# Patient Record
Sex: Female | Born: 1955 | Race: White | Hispanic: Refuse to answer | Marital: Single | Smoking: Never smoker
Health system: Southern US, Community
[De-identification: ages and names within clinical notes are randomized; demographics above are authoritative.]

## PROBLEM LIST (undated history)

## (undated) DIAGNOSIS — J45909 Unspecified asthma, uncomplicated: Secondary | ICD-10-CM

## (undated) DIAGNOSIS — E785 Hyperlipidemia, unspecified: Secondary | ICD-10-CM

## (undated) DIAGNOSIS — I2584 Coronary atherosclerosis due to calcified coronary lesion: Secondary | ICD-10-CM

## (undated) DIAGNOSIS — K7460 Unspecified cirrhosis of liver: Secondary | ICD-10-CM

## (undated) DIAGNOSIS — R011 Cardiac murmur, unspecified: Secondary | ICD-10-CM

## (undated) DIAGNOSIS — C189 Malignant neoplasm of colon, unspecified: Secondary | ICD-10-CM

## (undated) DIAGNOSIS — R600 Localized edema: Secondary | ICD-10-CM

## (undated) DIAGNOSIS — T7840XA Allergy, unspecified, initial encounter: Secondary | ICD-10-CM

## (undated) DIAGNOSIS — R197 Diarrhea, unspecified: Secondary | ICD-10-CM

## (undated) DIAGNOSIS — G709 Myoneural disorder, unspecified: Secondary | ICD-10-CM

## (undated) DIAGNOSIS — Z8679 Personal history of other diseases of the circulatory system: Secondary | ICD-10-CM

## (undated) DIAGNOSIS — M069 Rheumatoid arthritis, unspecified: Secondary | ICD-10-CM

## (undated) DIAGNOSIS — N189 Chronic kidney disease, unspecified: Secondary | ICD-10-CM

## (undated) DIAGNOSIS — K219 Gastro-esophageal reflux disease without esophagitis: Secondary | ICD-10-CM

## (undated) DIAGNOSIS — D649 Anemia, unspecified: Secondary | ICD-10-CM

## (undated) DIAGNOSIS — E669 Obesity, unspecified: Secondary | ICD-10-CM

## (undated) DIAGNOSIS — I2510 Atherosclerotic heart disease of native coronary artery without angina pectoris: Secondary | ICD-10-CM

## (undated) DIAGNOSIS — R51 Headache: Secondary | ICD-10-CM

## (undated) DIAGNOSIS — R519 Headache, unspecified: Secondary | ICD-10-CM

## (undated) DIAGNOSIS — I10 Essential (primary) hypertension: Secondary | ICD-10-CM

## (undated) DIAGNOSIS — H269 Unspecified cataract: Secondary | ICD-10-CM

## (undated) DIAGNOSIS — R0600 Dyspnea, unspecified: Secondary | ICD-10-CM

## (undated) DIAGNOSIS — J984 Other disorders of lung: Secondary | ICD-10-CM

## (undated) DIAGNOSIS — E66812 Obesity, class 2: Secondary | ICD-10-CM

## (undated) DIAGNOSIS — J189 Pneumonia, unspecified organism: Secondary | ICD-10-CM

## (undated) HISTORY — DX: Myoneural disorder, unspecified: G70.9

## (undated) HISTORY — DX: Localized edema: R60.0

## (undated) HISTORY — DX: Anemia, unspecified: D64.9

## (undated) HISTORY — DX: Cardiac murmur, unspecified: R01.1

## (undated) HISTORY — DX: Allergy, unspecified, initial encounter: T78.40XA

## (undated) HISTORY — DX: Hyperlipidemia, unspecified: E78.5

## (undated) HISTORY — PX: WRIST SURGERY: SHX841

## (undated) HISTORY — DX: Unspecified cataract: H26.9

## (undated) HISTORY — DX: Atherosclerotic heart disease of native coronary artery without angina pectoris: I25.10

## (undated) HISTORY — DX: Malignant neoplasm of colon, unspecified: C18.9

## (undated) HISTORY — PX: ABDOMINAL HYSTERECTOMY: SHX81

## (undated) HISTORY — DX: Obesity, class 2: E66.812

## (undated) HISTORY — PX: DILATION AND CURETTAGE OF UTERUS: SHX78

## (undated) HISTORY — DX: Obesity, unspecified: E66.9

---

## 2011-04-12 DIAGNOSIS — J849 Interstitial pulmonary disease, unspecified: Secondary | ICD-10-CM | POA: Insufficient documentation

## 2011-04-12 DIAGNOSIS — R062 Wheezing: Secondary | ICD-10-CM | POA: Insufficient documentation

## 2011-07-06 DIAGNOSIS — K7460 Unspecified cirrhosis of liver: Secondary | ICD-10-CM

## 2011-07-06 HISTORY — DX: Unspecified cirrhosis of liver: K74.60

## 2012-10-03 DIAGNOSIS — G4730 Sleep apnea, unspecified: Secondary | ICD-10-CM | POA: Insufficient documentation

## 2012-11-14 DIAGNOSIS — G2581 Restless legs syndrome: Secondary | ICD-10-CM | POA: Insufficient documentation

## 2013-01-08 ENCOUNTER — Emergency Department (HOSPITAL_COMMUNITY)
Admission: EM | Admit: 2013-01-08 | Discharge: 2013-01-08 | Disposition: A | Discharge status: Home / Self Care | Payer: BC Managed Care – PPO | Attending: Emergency Medicine | Admitting: Emergency Medicine

## 2013-01-08 ENCOUNTER — Encounter (HOSPITAL_COMMUNITY): Payer: Self-pay | Admitting: *Deleted

## 2013-01-08 DIAGNOSIS — X58XXXA Exposure to other specified factors, initial encounter: Secondary | ICD-10-CM | POA: Insufficient documentation

## 2013-01-08 DIAGNOSIS — R0609 Other forms of dyspnea: Secondary | ICD-10-CM | POA: Insufficient documentation

## 2013-01-08 DIAGNOSIS — J309 Allergic rhinitis, unspecified: Secondary | ICD-10-CM | POA: Insufficient documentation

## 2013-01-08 DIAGNOSIS — R509 Fever, unspecified: Secondary | ICD-10-CM | POA: Insufficient documentation

## 2013-01-08 DIAGNOSIS — R51 Headache: Secondary | ICD-10-CM | POA: Insufficient documentation

## 2013-01-08 DIAGNOSIS — S161XXA Strain of muscle, fascia and tendon at neck level, initial encounter: Secondary | ICD-10-CM

## 2013-01-08 DIAGNOSIS — R519 Headache, unspecified: Secondary | ICD-10-CM

## 2013-01-08 DIAGNOSIS — R0982 Postnasal drip: Secondary | ICD-10-CM | POA: Insufficient documentation

## 2013-01-08 DIAGNOSIS — S139XXA Sprain of joints and ligaments of unspecified parts of neck, initial encounter: Secondary | ICD-10-CM | POA: Insufficient documentation

## 2013-01-08 DIAGNOSIS — R05 Cough: Secondary | ICD-10-CM | POA: Insufficient documentation

## 2013-01-08 DIAGNOSIS — Y929 Unspecified place or not applicable: Secondary | ICD-10-CM | POA: Insufficient documentation

## 2013-01-08 DIAGNOSIS — R059 Cough, unspecified: Secondary | ICD-10-CM | POA: Insufficient documentation

## 2013-01-08 DIAGNOSIS — H9209 Otalgia, unspecified ear: Secondary | ICD-10-CM | POA: Insufficient documentation

## 2013-01-08 DIAGNOSIS — Z880 Allergy status to penicillin: Secondary | ICD-10-CM | POA: Insufficient documentation

## 2013-01-08 DIAGNOSIS — Z79899 Other long term (current) drug therapy: Secondary | ICD-10-CM | POA: Insufficient documentation

## 2013-01-08 DIAGNOSIS — Y999 Unspecified external cause status: Secondary | ICD-10-CM | POA: Insufficient documentation

## 2013-01-08 DIAGNOSIS — R0989 Other specified symptoms and signs involving the circulatory and respiratory systems: Secondary | ICD-10-CM | POA: Insufficient documentation

## 2013-01-08 DIAGNOSIS — Z8701 Personal history of pneumonia (recurrent): Secondary | ICD-10-CM | POA: Insufficient documentation

## 2013-01-08 DIAGNOSIS — Z881 Allergy status to other antibiotic agents status: Secondary | ICD-10-CM | POA: Insufficient documentation

## 2013-01-08 DIAGNOSIS — Z888 Allergy status to other drugs, medicaments and biological substances status: Secondary | ICD-10-CM | POA: Insufficient documentation

## 2013-01-08 DIAGNOSIS — J3489 Other specified disorders of nose and nasal sinuses: Secondary | ICD-10-CM | POA: Insufficient documentation

## 2013-01-08 HISTORY — DX: Pneumonia, unspecified organism: J18.9

## 2013-01-08 LAB — CBC WITH DIFFERENTIAL/PLATELET
Basophils Relative: 0 % (ref 0–1)
HCT: 40.5 % (ref 36.0–46.0)
Hemoglobin: 13.9 g/dL (ref 12.0–15.0)
Lymphocytes Relative: 25 % (ref 12–46)
Lymphs Abs: 1.7 10*3/uL (ref 0.7–4.0)
MCHC: 34.3 g/dL (ref 30.0–36.0)
Monocytes Absolute: 0.3 10*3/uL (ref 0.1–1.0)
Monocytes Relative: 5 % (ref 3–12)
Neutro Abs: 4.6 10*3/uL (ref 1.7–7.7)

## 2013-01-08 LAB — BASIC METABOLIC PANEL
BUN: 9 mg/dL (ref 6–23)
Chloride: 108 mEq/L (ref 96–112)
GFR calc Af Amer: >90 mL/min (ref >90)
Potassium: 3 mEq/L — ABNORMAL LOW (ref 3.5–5.1)

## 2013-01-08 MED ORDER — ACETAMINOPHEN 500 MG PO TABS: 500.0000 mg | ORAL_TABLET | Freq: Four times a day (QID) | ORAL | Status: DC | PRN

## 2013-01-08 MED ORDER — POTASSIUM CHLORIDE CRYS ER 20 MEQ PO TBCR
40.0000 meq | EXTENDED_RELEASE_TABLET | Freq: Once | ORAL | Status: AC
  Administered 2013-01-08: 40 meq via ORAL
  Filled 2013-01-08: qty 2

## 2013-01-08 NOTE — ED Notes (Signed)
Pt in NAD at discharge. Pt is ambulatory.

## 2013-01-08 NOTE — ED Provider Notes (Signed)
History    CSN: [id number] Arrival date & time 01/08/13  1314  First MD Initiated Contact with Patient 01/08/13 [phone]     Chief Complaint  Patient presents with  . Neck Pain  . Fever  . Headache   (Consider location/radiation/quality/duration/timing/severity/associated sxs/prior Treatment) HPI Comments: Pt w/ hx of possible SLE, frequent URIs now w/ left sided neck pain. Started 3 days ago, no trauma or heavy lifting. No radiculopathy. Pain radiates along left temporal area. Sx a/w otalgia, nasal congestion, PND, sinus pressure. + hx of pneumonia  Several years ago w/ intermittent cough - but cough is improving. No change from baseline. No recent travel or sick contact. No AMS, mild HA on left side. Admits to chills and subjective fever last pm but didn't check temp - took tylenol last pm. Denies confusion or neck rigidity. No unilateral weakness or numbness.   Patient is a 57 y.o. female presenting with neck pain, fever, and headaches. The history is provided by the patient. No language interpreter was used.  Neck Pain Pain location:  L side Quality:  Aching Radiates to: left temporal. Pain severity:  Mild Pain is:  Same all the time Onset quality:  Gradual Duration:  3 days Timing:  Constant Progression:  Worsening Relieved by:  Nothing Worsened by:  Nothing tried Ineffective treatments:  None tried Associated symptoms: fever and headaches   Associated symptoms: no chest pain   Fever Associated symptoms: congestion, ear pain, headaches and rhinorrhea   Associated symptoms: no chest pain, no chills, no confusion, no cough, no diarrhea, no dysuria, no nausea, no rash, no sore throat and no vomiting   Headache Associated symptoms: congestion, drainage, ear pain, fever and neck pain   Associated symptoms: no abdominal pain, no cough, no diarrhea, no dizziness, no nausea, no sore throat and no vomiting    Past Medical History  Diagnosis Date  . Pneumonia    Past Surgical  History  Procedure Laterality Date  . Abdominal hysterectomy     No family history on file. History  Substance Use Topics  . Smoking status: Never Smoker   . Smokeless tobacco: Not on file  . Alcohol Use: No   OB History   Grav Para Term Preterm Abortions TAB SAB Ect Mult Living                 Review of Systems  Constitutional: Positive for fever. Negative for chills.  HENT: Positive for ear pain, congestion, rhinorrhea, neck pain and postnasal drip. Negative for sore throat.   Respiratory: Negative for cough and shortness of breath.   Cardiovascular: Negative for chest pain and leg swelling.  Gastrointestinal: Negative for nausea, vomiting, abdominal pain, diarrhea and constipation.  Genitourinary: Negative for dysuria and frequency.  Skin: Negative for color change and rash.  Neurological: Positive for headaches. Negative for dizziness.  Psychiatric/Behavioral: Negative for confusion and agitation.  All other systems reviewed and are negative.    Allergies  Bee venom; Aspirin; Erythromycin; Other; and Penicillins  Home Medications   Current Outpatient Rx  Name  Route  Sig  Dispense  Refill  . acetaminophen (TYLENOL) 500 MG tablet   Oral   Take 500 mg by mouth daily as needed for pain or fever.         Marland Kitchen albuterol (PROVENTIL HFA;VENTOLIN HFA) 108 (90 BASE) MCG/ACT inhaler   Inhalation   Inhale 2 puffs into the lungs 3 (three) times daily as needed for wheezing or shortness of breath.         Marland Kitchen  albuterol (PROVENTIL) (2.5 MG/3ML) 0.083% nebulizer solution   Nebulization   Take 2.5 mg by nebulization 3 (three) times daily as needed for wheezing or shortness of breath.         . Azelastine-Fluticasone (DYMISTA) 137-50 MCG/ACT SUSP   Nasal   Place 2 sprays into the nose 3 (three) times daily as needed (for congestion).          BP 154/81  Pulse 67  Temp(Src) 98.4 F (36.9 C) (Oral)  Resp 16  SpO2 100% Physical Exam  Vitals reviewed. Constitutional:  She is oriented to person, place, and time. She appears well-developed and well-nourished. No distress.  HENT:  Head: Normocephalic and atraumatic.    Right Ear: Tympanic membrane normal.  Left Ear: Tympanic membrane normal.  Mouth/Throat: Uvula is midline, oropharynx is clear and moist and mucous membranes are normal.  Eyes: EOM are normal. Pupils are equal, round, and reactive to light.  Neck: Normal range of motion. Neck supple. Muscular tenderness present. No spinous process tenderness present. No rigidity. No Brudzinski's sign noted.    Cardiovascular: Normal rate and regular rhythm.   Pulmonary/Chest: Effort normal and breath sounds normal. No respiratory distress.  Abdominal: Soft. She exhibits no distension.  Musculoskeletal: Normal range of motion. She exhibits no edema.  Neurological: She is alert and oriented to person, place, and time. She has normal strength. No cranial nerve deficit or sensory deficit. Coordination normal. GCS eye subscore is 4. GCS verbal subscore is 5. GCS motor subscore is 6.  Skin: Skin is warm and dry.  Psychiatric: She has a normal mood and affect. Her behavior is normal.    ED Course  Procedures (including critical care time) Results for orders placed during the hospital encounter of 01/08/13  BASIC METABOLIC PANEL      Result Value Range   Sodium 147 (*) 135 - 145 mEq/L   Potassium 3.0 (*) 3.5 - 5.1 mEq/L   Chloride 108  96 - 112 mEq/L   CO2 27  19 - 32 mEq/L   Glucose, Bld 136 (*) 70 - 99 mg/dL   BUN 9  6 - 23 mg/dL   Creatinine, Ser 7.42  0.50 - 1.10 mg/dL   Calcium 9.4  8.4 - 59.5 mg/dL   GFR calc non Af Amer >90  >90 mL/min   GFR calc Af Amer >90  >90 mL/min  CBC WITH DIFFERENTIAL      Result Value Range   WBC 6.6  4.0 - 10.5 K/uL   RBC 4.93  3.87 - 5.11 MIL/uL   Hemoglobin 13.9  12.0 - 15.0 g/dL   HCT 63.8  75.6 - 43.3 %   MCV 82.2  78.0 - 100.0 fL   MCH 28.2  26.0 - 34.0 pg   MCHC 34.3  30.0 - 36.0 g/dL   RDW 29.5  18.8 - 41.6  %   Platelets 169  150 - 400 K/uL   Neutrophils Relative % 69  43 - 77 %   Neutro Abs 4.6  1.7 - 7.7 K/uL   Lymphocytes Relative 25  12 - 46 %   Lymphs Abs 1.7  0.7 - 4.0 K/uL   Monocytes Relative 5  3 - 12 %   Monocytes Absolute 0.3  0.1 - 1.0 K/uL   Eosinophils Relative 1  0 - 5 %   Eosinophils Absolute 0.1  0.0 - 0.7 K/uL   Basophils Relative 0  0 - 1 %   Basophils Absolute 0.0  0.0 -  0.1 K/uL    No results found. No diagnosis found.  MDM  Exam as above, no clinical meningismus. No focal neuro deficit.  likely viral URI or occipital neuralgia, doubt meningitis/encephalitis, HNP, radiculitis, fx/dislocation. Will refrain from labs or imaging at this time. No indication for LP. Potassium low at 3.0 - given kcl.  Will recommend symptomatic tx w/ NSAIDS and decongestants. Use heat/ice for comfort. Doubt bacterial sinusitis based on duration and pts well appearance on exam - non toxic.  fup w/ pcp 4 days if sx not improving. D/c home in good and stable condition. Given return precautions.   1. Cervical strain, acute, initial encounter   2. Headache    Discharge Medication List as of 01/08/2013  6:50 PM    START taking these medications   Details  !! acetaminophen (TYLENOL) 500 MG tablet Take 1 tablet (500 mg total) by mouth every 6 (six) hours as needed for pain., Starting 01/08/2013, Until Discontinued, Print     !! - Potential duplicate medications found. Please discuss with provider.     [facility]     [address] [place] [address]  Schedule an appointment as soon as possible for a visit for repeat blood pressure check in 3 wks     Audelia Hives, MD 01/09/13 [phone]

## 2013-01-08 NOTE — ED Notes (Signed)
Pt is here with fever, lower head and neck pain since Saturday.

## 2013-01-08 NOTE — ED Provider Notes (Signed)
Patient reports that fever onset 3 days ago. Accompanied by left-sided neck pain and left-sided headache. She last treated herself with Tylenol last dose yesterday afternoon. Eye exam patient is well-appearing HEENT exam normocephalic atraumatic mucous membrane moist no pharyngeal erythema bilateral tympanic membranes normal neck is supple no signs of meningitis no lymph cervical lymphadenopathy lungs clear auscultation Patient requests only Tylenol for pain she will take on her own. No signs of meningitis. She be referred to her primary care physician, as she has moved to [facility] within the past 6 days  Doug Sou, MD 01/08/13 1900

## 2013-01-09 NOTE — ED Provider Notes (Signed)
I have personally seen and examined the patient.  I have discussed the plan of care with the resident.  I have reviewed the documentation on PMH/FH/Soc. History.  I have reviewed the documentation of the resident and agree.  Kimberlyann Hollar, MD 01/09/13 1509 

## 2013-03-03 ENCOUNTER — Encounter (HOSPITAL_COMMUNITY): Payer: Self-pay | Admitting: Emergency Medicine

## 2013-03-03 ENCOUNTER — Emergency Department (HOSPITAL_COMMUNITY)
Admission: EM | Admit: 2013-03-03 | Discharge: 2013-03-03 | Disposition: A | Discharge status: Home / Self Care | Payer: BC Managed Care – PPO | Attending: Emergency Medicine | Admitting: Emergency Medicine

## 2013-03-03 DIAGNOSIS — Z8619 Personal history of other infectious and parasitic diseases: Secondary | ICD-10-CM | POA: Insufficient documentation

## 2013-03-03 DIAGNOSIS — M2550 Pain in unspecified joint: Secondary | ICD-10-CM | POA: Insufficient documentation

## 2013-03-03 DIAGNOSIS — IMO0002 Reserved for concepts with insufficient information to code with codable children: Secondary | ICD-10-CM | POA: Insufficient documentation

## 2013-03-03 DIAGNOSIS — IMO0001 Reserved for inherently not codable concepts without codable children: Secondary | ICD-10-CM | POA: Insufficient documentation

## 2013-03-03 DIAGNOSIS — R5381 Other malaise: Secondary | ICD-10-CM | POA: Insufficient documentation

## 2013-03-03 DIAGNOSIS — Z8701 Personal history of pneumonia (recurrent): Secondary | ICD-10-CM | POA: Insufficient documentation

## 2013-03-03 DIAGNOSIS — Z880 Allergy status to penicillin: Secondary | ICD-10-CM | POA: Insufficient documentation

## 2013-03-03 DIAGNOSIS — J45901 Unspecified asthma with (acute) exacerbation: Secondary | ICD-10-CM | POA: Insufficient documentation

## 2013-03-03 DIAGNOSIS — J069 Acute upper respiratory infection, unspecified: Secondary | ICD-10-CM

## 2013-03-03 DIAGNOSIS — Z79899 Other long term (current) drug therapy: Secondary | ICD-10-CM | POA: Insufficient documentation

## 2013-03-03 DIAGNOSIS — J029 Acute pharyngitis, unspecified: Secondary | ICD-10-CM | POA: Insufficient documentation

## 2013-03-03 DIAGNOSIS — R059 Cough, unspecified: Secondary | ICD-10-CM | POA: Insufficient documentation

## 2013-03-03 DIAGNOSIS — M069 Rheumatoid arthritis, unspecified: Secondary | ICD-10-CM | POA: Insufficient documentation

## 2013-03-03 DIAGNOSIS — R05 Cough: Secondary | ICD-10-CM | POA: Insufficient documentation

## 2013-03-03 DIAGNOSIS — J984 Other disorders of lung: Secondary | ICD-10-CM | POA: Insufficient documentation

## 2013-03-03 DIAGNOSIS — R6883 Chills (without fever): Secondary | ICD-10-CM | POA: Insufficient documentation

## 2013-03-03 DIAGNOSIS — R197 Diarrhea, unspecified: Secondary | ICD-10-CM | POA: Insufficient documentation

## 2013-03-03 DIAGNOSIS — J3489 Other specified disorders of nose and nasal sinuses: Secondary | ICD-10-CM | POA: Insufficient documentation

## 2013-03-03 HISTORY — DX: Unspecified asthma, uncomplicated: J45.909

## 2013-03-03 HISTORY — DX: Personal history of other diseases of the circulatory system: Z86.79

## 2013-03-03 HISTORY — DX: Other disorders of lung: J98.4

## 2013-03-03 HISTORY — DX: Rheumatoid arthritis, unspecified: M06.9

## 2013-03-03 LAB — BASIC METABOLIC PANEL
BUN: 13 mg/dL (ref 6–23)
CO2: 26 mEq/L (ref 19–32)
Chloride: 109 mEq/L (ref 96–112)
Creatinine, Ser: 0.6 mg/dL (ref 0.50–1.10)
Glucose, Bld: 95 mg/dL (ref 70–99)

## 2013-03-03 LAB — BASIC METABOLIC PANEL WITH GFR
Calcium: 9.1 mg/dL (ref 8.4–10.5)
GFR calc Af Amer: >90 mL/min (ref >90)
GFR calc non Af Amer: >90 mL/min (ref >90)
Potassium: 3.2 meq/L — ABNORMAL LOW (ref 3.5–5.1)
Sodium: 142 meq/L (ref 135–145)

## 2013-03-03 MED ORDER — POTASSIUM CHLORIDE CRYS ER 20 MEQ PO TBCR
40.0000 meq | EXTENDED_RELEASE_TABLET | Freq: Once | ORAL | Status: AC
  Administered 2013-03-03: 40 meq via ORAL
  Filled 2013-03-03: qty 2

## 2013-03-03 MED ORDER — LEVOFLOXACIN 500 MG PO TABS
500.0000 mg | ORAL_TABLET | Freq: Once | ORAL | Status: AC
Start: 2013-03-03 — End: 2013-03-03
  Administered 2013-03-03: 500 mg via ORAL
  Filled 2013-03-03: qty 1

## 2013-03-03 MED ORDER — ALBUTEROL SULFATE (5 MG/ML) 0.5% IN NEBU
5.0000 mg | INHALATION_SOLUTION | Freq: Once | RESPIRATORY_TRACT | Status: AC
  Administered 2013-03-03: 5 mg via RESPIRATORY_TRACT
  Filled 2013-03-03: qty 0.5

## 2013-03-03 MED ORDER — PREDNISONE 20 MG PO TABS: 40.0000 mg | ORAL_TABLET | Freq: Every day | ORAL | Status: DC

## 2013-03-03 MED ORDER — LEVOFLOXACIN 500 MG PO TABS: 500.0000 mg | ORAL_TABLET | Freq: Every day | ORAL | Status: DC

## 2013-03-03 MED ORDER — ALBUTEROL SULFATE HFA 108 (90 BASE) MCG/ACT IN AERS: 1.0000 | INHALATION_SPRAY | Freq: Four times a day (QID) | RESPIRATORY_TRACT | Status: DC | PRN

## 2013-03-03 MED ORDER — ALBUTEROL SULFATE (5 MG/ML) 0.5% IN NEBU
INHALATION_SOLUTION | RESPIRATORY_TRACT | Status: AC
  Administered 2013-03-03: 09:00:00
  Filled 2013-03-03: qty 0.5

## 2013-03-03 NOTE — ED Notes (Signed)
Pt c/o SOB, wheezing, and congestion X 1 week. Pt sts she has Nausea without vomiting and loose stools X 1 day. Pt sts she has history of lung disease but was told her lungs were clear X 1 month. NAD noted.

## 2013-03-03 NOTE — ED Provider Notes (Signed)
CSN: [id number]     Arrival date & time 03/03/13  0713 History   First MD Initiated Contact with Patient 03/03/13 [phone]     Chief Complaint  Patient presents with  . Shortness of Breath  . Nasal Congestion   (Consider location/radiation/quality/duration/timing/severity/associated sxs/prior Treatment) HPI Comments: Pt moved here from [place] in July, no PCP or pulmonologist.  She reports a h/o asthma and also "lung disease" that required 2 week hospitalization to "figure out" what was wrong with her lungs.  She cites multiple visits to the ED and clinics before anyone could find out why she kept getting recurrent infections.  She reprots she has been on albuterol in the past, but has run out.  She reports she "doesn't like to take medications" and this includes preventative meds.  She repeatedly cites how she used to be told in [place] that at the first sign of symptoms, she should go to the ED and she reports this as why she came here (unsure why her symptoms had been present for 1 week before coming and why she hasn't sought out a PCP).  Patient is a 57 y.o. female presenting with shortness of breath. The history is provided by the patient.  Shortness of Breath Severity:  Moderate Duration:  1 week Timing:  Constant Progression:  Worsening Chronicity:  New Relieved by:  Nothing Worsened by:  Activity Ineffective treatments:  None tried Associated symptoms: cough, sore throat and wheezing   Associated symptoms: no abdominal pain, no chest pain, no fever, no headaches, no rash and no vomiting   Associated symptoms comment:  Diarrhea Risk factors: no tobacco use     Past Medical History  Diagnosis Date  . Pneumonia   . Lung disease   . Asthma   . Rheumatoid arthritis   . H/O: rheumatic fever    Past Surgical History  Procedure Laterality Date  . Abdominal hysterectomy     History reviewed. No pertinent family history. History  Substance Use Topics  . Smoking status: Never  Smoker   . Smokeless tobacco: Not on file  . Alcohol Use: No   OB History   Grav Para Term Preterm Abortions TAB SAB Ect Mult Living                 Review of Systems  Constitutional: Positive for chills. Negative for fever.  HENT: Positive for congestion, sore throat and sinus pressure.   Respiratory: Positive for cough, shortness of breath and wheezing.   Cardiovascular: Negative for chest pain.  Gastrointestinal: Positive for diarrhea. Negative for vomiting and abdominal pain.  Musculoskeletal: Positive for myalgias and arthralgias.  Skin: Negative for rash.  Neurological: Negative for headaches.  All other systems reviewed and are negative.    Allergies  Bee venom; Aspirin; Erythromycin; Other; and Penicillins  Home Medications   Current Outpatient Rx  Name  Route  Sig  Dispense  Refill  . albuterol (PROVENTIL HFA;VENTOLIN HFA) 108 (90 BASE) MCG/ACT inhaler   Inhalation   Inhale 2 puffs into the lungs 3 (three) times daily as needed for wheezing or shortness of breath.         Marland Kitchen albuterol (PROVENTIL) (2.5 MG/3ML) 0.083% nebulizer solution   Nebulization   Take 2.5 mg by nebulization 3 (three) times daily as needed for wheezing or shortness of breath.         . Azelastine-Fluticasone (DYMISTA) 137-50 MCG/ACT SUSP   Nasal   Place 2 sprays into the nose 3 (three) times daily  as needed (congestion).          Marland Kitchen levofloxacin (LEVAQUIN) 500 MG tablet   Oral   Take 1 tablet (500 mg total) by mouth daily.   7 tablet   0   . predniSONE (DELTASONE) 20 MG tablet   Oral   Take 2 tablets (40 mg total) by mouth daily.   12 tablet   0    BP 146/68  Pulse 72  Temp(Src) 97.9 F (36.6 C) (Oral)  Resp 16  SpO2 98% Physical Exam  Nursing note and vitals reviewed. Constitutional: She is oriented to person, place, and time. She appears well-developed and well-nourished.  Non-toxic appearance. She does not have a sickly appearance. She does not appear ill. No distress.   Pt is well groomed, make up on, no distress noted  HENT:  Head: Normocephalic and atraumatic.  Nose: No mucosal edema or rhinorrhea.  Mouth/Throat: Uvula is midline and oropharynx is clear and moist.  Eyes: Conjunctivae and EOM are normal. No scleral icterus.  Neck: Neck supple.  Cardiovascular: Normal rate, regular rhythm and intact distal pulses.   Pulmonary/Chest: Effort normal. No respiratory distress. She has no wheezes. She has no rales.  Dry coughing noted  Abdominal: Soft. She exhibits no distension. There is no tenderness. There is no rebound and no guarding.  Musculoskeletal: Normal range of motion.  Lymphadenopathy:    She has no cervical adenopathy.  Neurological: She is alert and oriented to person, place, and time. No cranial nerve deficit. She exhibits normal muscle tone. Coordination normal.  Skin: Skin is warm. She is not diaphoretic.  Psychiatric: She has a normal mood and affect.    ED Course  Procedures (including critical care time) Labs Review Labs Reviewed  BASIC METABOLIC PANEL - Abnormal; Notable for the following:    Potassium 3.2 (*)    All other components within normal limits   Imaging Review No results found.   RA sat is 99% and I interpret to be normal   K+ is mildly low at 3.2.  Previously was 3.0.  Given oral replacement here, Rx for steroids and abx.  Pt given inhaler of albuterol for home and encouraged to obtain a PCP for ongoing outpt care especially given h/o chronic  MDM   1. Upper respiratory tract infection        Pt with URI symptoms, present for 1 week, no fever.  Given prior h/o and length of symptoms, reasonable to try oral abx.  She is concerned about the fact she had low K+ a few months ago in July, seen for neck pain then.  She was referred to a PCP then, but pt chose not to obtain.  I have counseled her at length about importance in someone with her reported history of chronic illness to have a PCP and probably a  pulmonologist as well.  Will give her an inhaler here for her symptoms.      Gavin Pound. Oletta Lamas, MD 03/04/13 2841

## 2013-03-03 NOTE — Discharge Instructions (Signed)

## 2013-03-03 NOTE — ED Notes (Addendum)
Dr. At bedside 

## 2013-08-16 ENCOUNTER — Institutional Professional Consult (permissible substitution): Payer: BC Managed Care – PPO | Admitting: Emergency Medicine

## 2013-08-17 ENCOUNTER — Encounter (INDEPENDENT_AMBULATORY_CARE_PROVIDER_SITE_OTHER): Payer: Self-pay

## 2013-08-17 ENCOUNTER — Encounter: Payer: Self-pay | Admitting: Internal Medicine

## 2013-08-17 ENCOUNTER — Ambulatory Visit (INDEPENDENT_AMBULATORY_CARE_PROVIDER_SITE_OTHER): Payer: BC Managed Care – PPO | Admitting: Internal Medicine

## 2013-08-17 VITALS — BP 142/86 | HR 50 | Temp 97.4°F | Ht <= 58 in | Wt 145.0 lb

## 2013-08-17 DIAGNOSIS — R058 Other specified cough: Secondary | ICD-10-CM

## 2013-08-17 DIAGNOSIS — R079 Chest pain, unspecified: Secondary | ICD-10-CM

## 2013-08-17 DIAGNOSIS — R059 Cough, unspecified: Secondary | ICD-10-CM

## 2013-08-17 DIAGNOSIS — R05 Cough: Secondary | ICD-10-CM

## 2013-08-17 NOTE — Progress Notes (Signed)
Subjective:    Patient ID: Kendra Gilbert, female    DOB: 1956/05/12   MRN: [id number]  HPI  58 yowf never smoker with RA since age 58 with worse symptoms x 2005 of arthritis and worse breathing when arrived in [place] ? Dx asthma ? Maybe better with prednisone now under the care of Dr Drema Dallas referred by Dr Orland Penman filling in for Dr Drema Dallas for eval of ? Asthma    08/17/2013 1st [facility] office visit/ Kendra Gilbert cc daily sense of nasal drainage/ worse in am's assoc with am cough  with minimal clear mucus ? Better with dysmista.  Variable sob / cp with ex x walking daily x 3  years better in [place] walking up up to an hour including some hills.  Assoc with mild dysphagia. saba doesn't clearly help in either hfa or neb form   No obvious day to day or daytime variabilty or assoc pleuritic   cp or chest tightness, subjective wheeze overt sinus or hb symptoms. No unusual exp hx or h/o childhood pna/ asthma or knowledge of premature birth.  Sleeping ok without nocturnal  or early am exacerbation  of respiratory  c/o's or need for noct saba. Also denies any obvious fluctuation of symptoms with weather or environmental changes or other aggravating or alleviating factors except as outlined above   Current Medications, Allergies, Complete Past Medical History, Past Surgical History, Family History, and Social History were reviewed in [employer] record.          Review of Systems  Constitutional: Negative for fever, chills, diaphoresis, activity change, appetite change, fatigue and unexpected weight change.  HENT: Positive for congestion, sneezing, sore throat and trouble swallowing. Negative for dental problem, ear discharge, ear pain, facial swelling, hearing loss, mouth sores, nosebleeds, postnasal drip, rhinorrhea, sinus pressure, tinnitus and voice change.   Eyes: Negative for photophobia, discharge, itching and visual disturbance.  Respiratory: Positive for cough  and shortness of breath. Negative for apnea, choking, chest tightness, wheezing and stridor.   Cardiovascular: Positive for chest pain. Negative for palpitations and leg swelling.  Gastrointestinal: Negative for nausea, vomiting, abdominal pain, constipation, blood in stool and abdominal distention.  Genitourinary: Negative for dysuria, urgency, frequency, hematuria, flank pain, decreased urine volume and difficulty urinating.  Musculoskeletal: Positive for arthralgias and joint swelling. Negative for back pain, gait problem, myalgias, neck pain and neck stiffness.  Skin: Negative for color change, pallor and rash.  Neurological: Positive for headaches. Negative for dizziness, tremors, seizures, syncope, speech difficulty, weakness, light-headedness and numbness.  Hematological: Negative for adenopathy. Does not bruise/bleed easily.  Psychiatric/Behavioral: Negative for confusion, sleep disturbance and agitation. The patient is not nervous/anxious.        Objective:   Physical Exam  Wt Readings from Last 3 Encounters:  08/17/13 145 lb (65.772 kg)      amb wf Patient failed to answer a single question asked in a straightforward manner, tending to go off on tangents or answer questions with ambiguous medical terms or diagnoses and seemed aggravated / perplexed when asked the same question more than once for clarification.   HEENT: nl dentition, turbinates, and orophanx. Nl external ear canals without cough reflex   NECK :  without JVD/Nodes/TM/ nl carotid upstrokes bilaterally   LUNGS: no acc muscle use, clear to A and P bilaterally without cough on insp or exp maneuvers   CV:  RRR  no s3 or murmur or increase in P2, no edema  ABD:  soft and nontender with nl excursion in the supine position. No bruits or organomegaly, bowel sounds nl  MS:  warm without deformities, calf tenderness, cyanosis or clubbing  SKIN: warm and dry without lesions    NEURO:  alert, approp, no deficits    HRCT Chest  October 03 2012 [place] [facility]     Assessment & Plan:

## 2013-08-17 NOTE — Patient Instructions (Addendum)
Chlortrimeton 4 mg at bedtime and if not effective add pepcid 20 mg try for at least a week  GERD (REFLUX)  is an extremely common cause of respiratory symptoms, many times with no significant heartburn at all.    It can be treated with medication, but also with lifestyle changes including avoidance of late meals, excessive alcohol, smoking cessation, and avoid fatty foods, chocolate, peppermint, colas, red wine, and acidic juices such as orange juice.  NO MINT OR MENTHOL PRODUCTS SO NO COUGH DROPS  USE SUGARLESS CANDY INSTEAD (jolley ranchers or Stover's)  NO OIL BASED VITAMINS - use powdered substitutes.  If the chest discomfort with exertion gets more noticeable you will need a cardiac stress test    Please schedule a follow up office visit in 6 weeks, call sooner if needed with pfts and cxr to complete your arthritis work up

## 2013-08-19 DIAGNOSIS — R058 Other specified cough: Secondary | ICD-10-CM | POA: Insufficient documentation

## 2013-08-19 DIAGNOSIS — R079 Chest pain, unspecified: Secondary | ICD-10-CM | POA: Insufficient documentation

## 2013-08-19 DIAGNOSIS — R05 Cough: Secondary | ICD-10-CM | POA: Insufficient documentation

## 2013-08-19 NOTE — Assessment & Plan Note (Signed)
Lack of consistent response to saba in any from and assoc sense of am throat drainage strongly suggestive of  Classic Upper airway cough syndrome, so named because it's frequently impossible to sort out how much is  CR/sinusitis with freq throat clearing (which can be related to primary GERD)   vs  causing  secondary (" extra esophageal")  GERD from wide swings in gastric pressure that occur with throat clearing, often  promoting self use of mint and menthol lozenges that reduce the lower esophageal sphincter tone and exacerbate the problem further in a cyclical fashion.   These are the same pts (now being labeled as having "irritable larynx syndrome" by some cough centers) who not infrequently have a history of having failed to tolerate ace inhibitors,  dry powder inhalers or biphosphonates or report having atypical reflux symptoms that don't respond to standard doses of PPI , and are easily confused as having aecopd or asthma flares by even experienced allergists/ pulmonologists.   For now add 1st gen h1 and then h2 to see if helps am symptoms

## 2013-08-19 NOTE — Assessment & Plan Note (Signed)
Chronicicty/ reproducibility suggest angina pectoris (albeit a very stable patter)  rather than gerd (which is also certainly possible_ or a pulmonary mechanism ( which is very unlikely as happens same in cold vs hot weather proportionate to exertion) , strongly suggested she consider a cardiac stress test though she was resistant so defer this to issue to Dr Drema Dallas

## 2013-08-23 ENCOUNTER — Encounter: Payer: Self-pay | Admitting: Cardiology

## 2013-08-23 ENCOUNTER — Ambulatory Visit (INDEPENDENT_AMBULATORY_CARE_PROVIDER_SITE_OTHER): Payer: BC Managed Care – PPO | Admitting: Cardiology

## 2013-08-23 VITALS — BP 158/82 | HR 60 | Ht <= 58 in | Wt 143.0 lb

## 2013-08-23 DIAGNOSIS — I2510 Atherosclerotic heart disease of native coronary artery without angina pectoris: Secondary | ICD-10-CM | POA: Insufficient documentation

## 2013-08-23 DIAGNOSIS — R002 Palpitations: Secondary | ICD-10-CM

## 2013-08-23 DIAGNOSIS — I2584 Coronary atherosclerosis due to calcified coronary lesion: Secondary | ICD-10-CM

## 2013-08-23 DIAGNOSIS — R079 Chest pain, unspecified: Secondary | ICD-10-CM

## 2013-08-23 LAB — LDL CHOLESTEROL, DIRECT: LDL DIRECT: 135.2 mg/dL

## 2013-08-23 LAB — LIPID PANEL
CHOLESTEROL: 213 mg/dL — AB (ref 0–200)
HDL: 66.3 mg/dL (ref >39.00)
TRIGLYCERIDES: 88 mg/dL (ref 0.0–149.0)
Total CHOL/HDL Ratio: 3
VLDL: 17.6 mg/dL (ref 0.0–40.0)

## 2013-08-23 NOTE — Progress Notes (Signed)
HPI  the patient presents or evaluation of chest discomfort and a history of coronary calcification. She was found to have coronary calcification in 2012 when she was being treated and evaluated for upper respiratory infections. She says that at that time she had an exercise treadmill test which was apparently normal. She had a history of rheumatic fever as a child but was never told that she had any valvular disease. She does get some chest discomfort. This happens with peak exertion such as walking up an incline. This may be slightly worse slowly over the last couple of years. She has some right shoulder discomfort at times. She does not describe resting symptoms such as chest pressure, neck or arm discomfort. She hasn't had any new shortness of breath, PND or orthopnea. She hasn't had any palpitations , presyncope or syncope. She does walk daily. She is committed to walking 7 days per week.  Allergies  Allergen Reactions  . Bee Venom Anaphylaxis and Hives  . Aspirin Other (See Comments)    Causes stomach problems  . Erythromycin Other (See Comments)    Ate the lining of her stomach  . Other Other (See Comments)    Mosquito bites cause anaphylaxis reaction and hives  . Penicillins Rash    Current Outpatient Prescriptions  Medication Sig Dispense Refill  . albuterol (PROVENTIL HFA;VENTOLIN HFA) 108 (90 BASE) MCG/ACT inhaler Inhale 2 puffs into the lungs 3 (three) times daily as needed for wheezing or shortness of breath.      Marland Kitchen albuterol (PROVENTIL) (2.5 MG/3ML) 0.083% nebulizer solution Take 2.5 mg by nebulization 3 (three) times daily as needed for wheezing or shortness of breath.      . Azelastine-Fluticasone (DYMISTA) 137-50 MCG/ACT SUSP Place 2 sprays into the nose 3 (three) times daily as needed (congestion).        No current facility-administered medications for this visit.    Past Medical History  Diagnosis Date  . Pneumonia   . Lung disease   . Asthma   . Rheumatoid  arthritis   . H/O: rheumatic fever     Past Surgical History  Procedure Laterality Date  . Abdominal hysterectomy      Family History  Problem Relation Age of Onset  . Heart disease Mother   . Breast cancer Mother   . Asthma Father   . Heart disease Father   . Allergies Father     History   Social History  . Marital Status: Single    Spouse Name: N/A    Number of Children: N/A  . Years of Education: N/A   Occupational History  . Not on file.   Social History Main Topics  . Smoking status: Never Smoker   . Smokeless tobacco: Never Used  . Alcohol Use: No  . Drug Use: No  . Sexual Activity: Not on file   Other Topics Concern  . Not on file   Social History Narrative  . No narrative on file    ROS:   Positive for occasional dizziness , occasional diarrhea and constipation , some difficulty swallowing , mild ankle swelling. Otherwise as stated in the HPI and negative for all other systems.  PHYSICAL EXAM BP 158/82  Pulse 60  Ht 4\' 9"  (1.448 m)  Wt 143 lb (64.864 kg)  BMI 30.94 kg/m2 GENERAL:  Well appearing HEENT:  Pupils equal round and reactive, fundi not visualized, oral mucosa unremarkable NECK:  No jugular venous distention, waveform within normal limits, carotid upstroke brisk  and symmetric, no bruits, no thyromegaly LYMPHATICS:  No cervical, inguinal adenopathy LUNGS:  Clear to auscultation bilaterally BACK:  No CVA tenderness CHEST:  Unremarkable HEART:  PMI not displaced or sustained,S1 and S2 within normal limits, no S3, no S4, no clicks, no rubs, no murmurs ABD:  Flat, positive bowel sounds normal in frequency in pitch, no bruits, no rebound, no guarding, no midline pulsatile mass, no hepatomegaly, no splenomegaly EXT:  2 plus pulses throughout, trace ankle edema, no cyanosis no clubbing SKIN:  No rashes no nodules NEURO:  Cranial nerves II through XII grossly intact, motor grossly intact throughout PSYCH:  Cognitively intact, oriented to person  place and time  EKG:   Sinus rhythm, rate 60, axis within normal limits, intervals within normal limits, no acute ST-T wave changes. 08/23/2013  ASSESSMENT AND PLAN  CORONARY CALCIUM:   We discussed this at length. She will get a treadmill test as below. Given her various strong family history take a very aggressive approach to primary risk reduction. Toward that end I will check a lipid profile. We will then have a discussion about the merits of primary preventive statin in her particular situation. I would have a low threshold to follow her with lipid sub-fractionation and to treat with a statin.  HTN:  The blood pressure is mildly elevated. I have instructed the patient to record a blood pressure diary and recording this. This will be presented for my review and pending these results I will make further suggestions about changes in therapy for optimal blood pressure control.  CHEST PAIN:  The pretest probability of obstructive coronary disease is low. However, I do note that she has some vascular disease.  I will bring the patient back for a POET (Plain Old Exercise Test). This will allow me to screen for obstructive coronary disease, risk stratify and very importantly provide a prescription for exercise.

## 2013-08-23 NOTE — Patient Instructions (Signed)
The current medical regimen is effective;  continue present plan and medications.  Please have blood work today.  Your physician has requested that you have an exercise tolerance test. For further information please visit [email or web address]. Please also follow instruction sheet, as given.  Follow up in 1 year with Dr Percival Spanish.  You will receive a letter in the mail 2 months before you are due.  Please call us when you receive this letter to schedule your follow up appointment.

## 2013-08-28 ENCOUNTER — Telehealth (HOSPITAL_COMMUNITY): Payer: Self-pay | Admitting: *Deleted

## 2013-08-29 ENCOUNTER — Telehealth: Payer: Self-pay | Admitting: Cardiology

## 2013-08-29 NOTE — Telephone Encounter (Signed)
Pt aware of results of lipid profile - aware I will call back once Dr Percival Spanish has been reviewed.

## 2013-08-29 NOTE — Telephone Encounter (Signed)
Lm to cb for results

## 2013-08-29 NOTE — Telephone Encounter (Signed)
New message  ° ° °Patient calling for test results.   °

## 2013-08-29 NOTE — Telephone Encounter (Signed)
Patient is returning your call. Please call back.  °

## 2013-08-30 ENCOUNTER — Encounter (HOSPITAL_COMMUNITY): Payer: BC Managed Care – PPO

## 2013-09-03 ENCOUNTER — Telehealth (HOSPITAL_COMMUNITY): Payer: Self-pay | Admitting: *Deleted

## 2013-09-03 ENCOUNTER — Other Ambulatory Visit (HOSPITAL_COMMUNITY): Payer: Self-pay | Admitting: Cardiology

## 2013-09-03 DIAGNOSIS — R002 Palpitations: Secondary | ICD-10-CM

## 2013-09-04 ENCOUNTER — Other Ambulatory Visit: Payer: Self-pay | Admitting: *Deleted

## 2013-09-04 MED ORDER — ATORVASTATIN CALCIUM 40 MG PO TABS: 40.0000 mg | ORAL_TABLET | Freq: Every day | ORAL | Status: DC

## 2013-09-04 NOTE — Telephone Encounter (Signed)
Follow up    Pt returning Pam's call about pt's medication.   Please give her a call back.

## 2013-09-04 NOTE — Telephone Encounter (Signed)
Pt aware to start Atorvastatin 40 mg a day - Rx sent into pharmacy as requested.

## 2013-09-07 ENCOUNTER — Ambulatory Visit (HOSPITAL_COMMUNITY)
Admission: RE | Admit: 2013-09-07 | Discharge: 2013-09-07 | Disposition: A | Discharge status: Home / Self Care | Payer: BC Managed Care – PPO | Source: Ambulatory Visit | Attending: Cardiology | Admitting: Cardiology

## 2013-09-07 DIAGNOSIS — R002 Palpitations: Secondary | ICD-10-CM

## 2013-09-11 ENCOUNTER — Telehealth: Payer: Self-pay | Admitting: *Deleted

## 2013-09-11 NOTE — Telephone Encounter (Signed)
Called to give pt results of GXT which was negative.  Received a message stating the person you are trying to reach is not accepting calls at this time.  Will continue to attempt to contact.

## 2013-09-13 ENCOUNTER — Ambulatory Visit
Admission: RE | Admit: 2013-09-13 | Discharge: 2013-09-13 | Disposition: A | Discharge status: Home / Self Care | Payer: BC Managed Care – PPO | Source: Ambulatory Visit | Attending: Rheumatology | Admitting: Rheumatology

## 2013-09-13 ENCOUNTER — Other Ambulatory Visit: Payer: Self-pay | Admitting: Rheumatology

## 2013-09-13 DIAGNOSIS — R0602 Shortness of breath: Secondary | ICD-10-CM

## 2013-09-13 DIAGNOSIS — M545 Low back pain, unspecified: Secondary | ICD-10-CM

## 2013-09-17 NOTE — Telephone Encounter (Signed)
Is calling about her test results please call [phone]

## 2013-09-17 NOTE — Telephone Encounter (Signed)
Attempted to call pt back - received a recording stating they are not accepting calls at this time.  Will attempt to call back

## 2013-09-17 NOTE — Telephone Encounter (Signed)
Attempted to call pt again - not accepting calls at this time.

## 2013-09-19 ENCOUNTER — Telehealth: Payer: Self-pay | Admitting: Cardiology

## 2013-09-19 NOTE — Telephone Encounter (Signed)
Pt aware of results 

## 2013-09-19 NOTE — Telephone Encounter (Signed)
Pt aware of GXT results

## 2013-09-19 NOTE — Telephone Encounter (Signed)
New message ° ° ° ° °Pt want stress test results. °

## 2013-09-28 ENCOUNTER — Ambulatory Visit: Payer: BC Managed Care – PPO | Admitting: Internal Medicine

## 2013-11-16 DIAGNOSIS — M1990 Unspecified osteoarthritis, unspecified site: Secondary | ICD-10-CM | POA: Insufficient documentation

## 2014-07-05 DIAGNOSIS — J189 Pneumonia, unspecified organism: Secondary | ICD-10-CM

## 2014-07-05 HISTORY — DX: Pneumonia, unspecified organism: J18.9

## 2015-03-28 ENCOUNTER — Ambulatory Visit: Payer: Self-pay | Admitting: Cardiology

## 2015-07-06 DIAGNOSIS — C189 Malignant neoplasm of colon, unspecified: Secondary | ICD-10-CM

## 2015-07-06 HISTORY — DX: Malignant neoplasm of colon, unspecified: C18.9

## 2015-07-06 HISTORY — PX: OTHER SURGICAL HISTORY: SHX169

## 2015-11-18 ENCOUNTER — Encounter (HOSPITAL_COMMUNITY): Payer: Self-pay

## 2015-11-18 ENCOUNTER — Emergency Department (HOSPITAL_COMMUNITY)
Admission: EM | Admit: 2015-11-18 | Discharge: 2015-11-18 | Disposition: A | Discharge status: Home / Self Care | Payer: BLUE CROSS/BLUE SHIELD | Attending: Emergency Medicine | Admitting: Emergency Medicine

## 2015-11-18 DIAGNOSIS — Z7951 Long term (current) use of inhaled steroids: Secondary | ICD-10-CM | POA: Insufficient documentation

## 2015-11-18 DIAGNOSIS — J45909 Unspecified asthma, uncomplicated: Secondary | ICD-10-CM | POA: Insufficient documentation

## 2015-11-18 DIAGNOSIS — K5900 Constipation, unspecified: Secondary | ICD-10-CM

## 2015-11-18 DIAGNOSIS — R197 Diarrhea, unspecified: Secondary | ICD-10-CM | POA: Diagnosis not present

## 2015-11-18 DIAGNOSIS — Z79899 Other long term (current) drug therapy: Secondary | ICD-10-CM | POA: Insufficient documentation

## 2015-11-18 DIAGNOSIS — K625 Hemorrhage of anus and rectum: Secondary | ICD-10-CM | POA: Diagnosis present

## 2015-11-18 DIAGNOSIS — M069 Rheumatoid arthritis, unspecified: Secondary | ICD-10-CM | POA: Insufficient documentation

## 2015-11-18 DIAGNOSIS — Z8719 Personal history of other diseases of the digestive system: Secondary | ICD-10-CM

## 2015-11-18 LAB — CBC
HEMATOCRIT: 41.7 % (ref 36.0–46.0)
HEMOGLOBIN: 13.7 g/dL (ref 12.0–15.0)
MCH: 27.2 pg (ref 26.0–34.0)
MCHC: 32.9 g/dL (ref 30.0–36.0)
MCV: 82.9 fL (ref 78.0–100.0)
Platelets: 187 10*3/uL (ref 150–400)
RBC: 5.03 MIL/uL (ref 3.87–5.11)
RDW: 13.2 % (ref 11.5–15.5)
WBC: 6 10*3/uL (ref 4.0–10.5)

## 2015-11-18 LAB — COMPREHENSIVE METABOLIC PANEL
ALBUMIN: 3.9 g/dL (ref 3.5–5.0)
ALT: 31 U/L (ref 14–54)
ANION GAP: 8 (ref 5–15)
AST: 30 U/L (ref 15–41)
Alkaline Phosphatase: 93 U/L (ref 38–126)
BUN: 15 mg/dL (ref 6–20)
CO2: 26 mmol/L (ref 22–32)
Calcium: 9.4 mg/dL (ref 8.9–10.3)
Chloride: 105 mmol/L (ref 101–111)
Creatinine, Ser: 0.73 mg/dL (ref 0.44–1.00)
GFR calc Af Amer: >60 mL/min (ref >60)
GFR calc non Af Amer: >60 mL/min (ref >60)
GLUCOSE: 99 mg/dL (ref 65–99)
POTASSIUM: 4.1 mmol/L (ref 3.5–5.1)
SODIUM: 139 mmol/L (ref 135–145)
Total Bilirubin: 1 mg/dL (ref 0.3–1.2)
Total Protein: 7.6 g/dL (ref 6.5–8.1)

## 2015-11-18 LAB — TYPE AND SCREEN
ABO/RH(D): O POS
ANTIBODY SCREEN: NEGATIVE

## 2015-11-18 LAB — ABO/RH: ABO/RH(D): O POS

## 2015-11-18 LAB — POC OCCULT BLOOD, ED: FECAL OCCULT BLD: POSITIVE — AB

## 2015-11-18 NOTE — ED Notes (Signed)
Pt presents with c/o rectal bleeding. Pt reports that last week, she noticed some rectal bleeding after episodes of diarrhea and then yesterday she passed a blood clot in her stool. Pt reports a hx of IBS.

## 2015-11-18 NOTE — ED Provider Notes (Signed)
CSN: [id number]     Arrival date & time 11/18/15  0749 History   First MD Initiated Contact with Patient 11/18/15 [phone]     Chief Complaint  Patient presents with  . Rectal Bleeding     (Consider location/radiation/quality/duration/timing/severity/associated sxs/prior Treatment) HPI Comments: 60 year old female with past medical history including IBS and rheumatoid arthritis who presents with diarrhea and rectal bleeding. The patient states that she has a long history of IBS which fluctuates from constipation to diarrhea; she has had these symptoms since she was a teenager. Over the past few weeks, she has had or diarrhea symptoms. Last week, she had an episode of diarrhea followed by a small amount of rectal bleeding. It spontaneously resolved the same day. Yesterday, she again had an episode of diarrhea followed by passage of a blood clot. She has not had any rectal bleeding today. She denies any black stools. She endorses bloating but denies any significant abdominal pain. No vomiting, fevers, or recent illness. She has been told to get a colonoscopy but has not done so yet.  Patient is a 60 y.o. female presenting with hematochezia. The history is provided by the patient.  Rectal Bleeding   Past Medical History  Diagnosis Date  . Pneumonia   . Lung disease   . Asthma   . Rheumatoid arthritis ([employer])   . H/O: rheumatic fever    Past Surgical History  Procedure Laterality Date  . Abdominal hysterectomy     Family History  Problem Relation Age of Onset  . CAD Mother 64    CABG/stents  . Breast cancer Mother   . Asthma Father   . CAD Father 93  . Allergies Father   . CAD Brother 26    Died with MI  . CAD Brother 61    9 stents  . CAD Brother 74    7 stents   Social History  Substance Use Topics  . Smoking status: Never Smoker   . Smokeless tobacco: Never Used  . Alcohol Use: No   OB History    No data available     Review of Systems  Gastrointestinal: Positive for  hematochezia.   10 Systems reviewed and are negative for acute change except as noted in the HPI.    Allergies  Bee venom; Aspirin; Erythromycin; Other; and Penicillins  Home Medications   Prior to Admission medications   Medication Sig Start Date End Date Taking? Authorizing Provider  albuterol (PROVENTIL HFA;VENTOLIN HFA) 108 (90 BASE) MCG/ACT inhaler Inhale 2 puffs into the lungs 3 (three) times daily as needed for wheezing or shortness of breath.   Yes Historical Provider, MD  albuterol (PROVENTIL) (2.5 MG/3ML) 0.083% nebulizer solution Take 2.5 mg by nebulization 3 (three) times daily as needed for wheezing or shortness of breath.   Yes Historical Provider, MD  Azelastine-Fluticasone (DYMISTA) 137-50 MCG/ACT SUSP Place 2 sprays into the nose 3 (three) times daily as needed (congestion).    Yes Historical Provider, MD  loratadine (CLARITIN) 10 MG tablet Take 10 mg by mouth daily.   Yes Historical Provider, MD  atorvastatin (LIPITOR) 40 MG tablet Take 1 tablet (40 mg total) by mouth daily. Patient not taking: Reported on 11/18/2015 09/04/13   Minus Breeding, MD   BP 147/79 mmHg  Pulse 62  Temp(Src) 98 F (36.7 C) (Oral)  Resp 18  SpO2 100% Physical Exam  Constitutional: She is oriented to person, place, and time. She appears well-developed and well-nourished. No distress.  HENT:  Head:  Normocephalic and atraumatic.  Moist mucous membranes  Eyes: Conjunctivae are normal. Pupils are equal, round, and reactive to light.  Neck: Neck supple.  Cardiovascular: Normal rate, regular rhythm and normal heart sounds.   No murmur heard. Pulmonary/Chest: Effort normal and breath sounds normal.  Abdominal: Soft. Bowel sounds are normal. She exhibits no distension. There is no tenderness.  Genitourinary:  Normal rectal tone, formed stool in rectal vault, no frank blood on exam, no external hemorrhoids Stool hemoccult positive Chaperone was present during exam.   Musculoskeletal: She  exhibits no edema.  Neurological: She is alert and oriented to person, place, and time.  Fluent speech  Skin: Skin is warm and dry.  Psychiatric: She has a normal mood and affect. Judgment normal.  Nursing note and vitals reviewed.   ED Course  Procedures (including critical care time) Labs Review Labs Reviewed  POC OCCULT BLOOD, ED - Abnormal; Notable for the following:    Fecal Occult Bld POSITIVE (*)    All other components within normal limits  COMPREHENSIVE METABOLIC PANEL  CBC  TYPE AND SCREEN  ABO/RH    Imaging Review No results found. I have personally reviewed and evaluated these lab results as part of my medical decision-making.    MDM   Final diagnoses:  History of rectal bleeding  Constipation, unspecified constipation type  Diarrhea, unspecified type    Patient presents with long-standing symptoms of alternating diarrhea and constipation related to IBS as well as an isolated episode of rectal bleeding last week and again yesterday. Patient well-appearing with reassuring vital signs. No abdominal tenderness on exam. No obvious bleeding on rectal exam, stool was Hemoccult positive but not obviously bloody. Her lab work here is unremarkable including normal WBC count. Because of the patient's long-standing history of constipation, she may have internal hemorrhoids. Given no bleeding today, she is safe for discharge. I have recommended follow-up with gastroenterology to schedule the colonoscopy that she has previously discussed. Patient voiced understanding of return precautions including significant bleeding, lightheadedness, or severe abdominal pain. Patient discharged in satisfactory condition.    Sharlett Iles, MD 11/18/15 [phone]

## 2015-12-06 ENCOUNTER — Encounter (HOSPITAL_COMMUNITY): Payer: Self-pay | Admitting: Emergency Medicine

## 2015-12-06 ENCOUNTER — Emergency Department (HOSPITAL_COMMUNITY): Payer: BLUE CROSS/BLUE SHIELD

## 2015-12-06 ENCOUNTER — Emergency Department (HOSPITAL_COMMUNITY)
Admission: EM | Admit: 2015-12-06 | Discharge: 2015-12-06 | Disposition: A | Discharge status: Home / Self Care | Payer: BLUE CROSS/BLUE SHIELD | Attending: Emergency Medicine | Admitting: Emergency Medicine

## 2015-12-06 DIAGNOSIS — J45909 Unspecified asthma, uncomplicated: Secondary | ICD-10-CM | POA: Diagnosis not present

## 2015-12-06 DIAGNOSIS — R911 Solitary pulmonary nodule: Secondary | ICD-10-CM

## 2015-12-06 DIAGNOSIS — M069 Rheumatoid arthritis, unspecified: Secondary | ICD-10-CM | POA: Insufficient documentation

## 2015-12-06 DIAGNOSIS — R1032 Left lower quadrant pain: Secondary | ICD-10-CM

## 2015-12-06 LAB — COMPREHENSIVE METABOLIC PANEL
ALT: 21 U/L (ref 14–54)
ANION GAP: 11 (ref 5–15)
AST: 25 U/L (ref 15–41)
Albumin: 3.8 g/dL (ref 3.5–5.0)
Alkaline Phosphatase: 84 U/L (ref 38–126)
BUN: 14 mg/dL (ref 6–20)
CALCIUM: 9.1 mg/dL (ref 8.9–10.3)
CHLORIDE: 108 mmol/L (ref 101–111)
CO2: 24 mmol/L (ref 22–32)
CREATININE: 0.59 mg/dL (ref 0.44–1.00)
Glucose, Bld: 87 mg/dL (ref 65–99)
Potassium: 3.5 mmol/L (ref 3.5–5.1)
SODIUM: 143 mmol/L (ref 135–145)
Total Bilirubin: <0.1 mg/dL — ABNORMAL LOW (ref 0.3–1.2)
Total Protein: 7.5 g/dL (ref 6.5–8.1)

## 2015-12-06 LAB — URINALYSIS, ROUTINE W REFLEX MICROSCOPIC
Bilirubin Urine: NEGATIVE
Glucose, UA: NEGATIVE mg/dL
HGB URINE DIPSTICK: NEGATIVE
KETONES UR: NEGATIVE mg/dL
Leukocytes, UA: NEGATIVE
NITRITE: NEGATIVE
PROTEIN: NEGATIVE mg/dL
Specific Gravity, Urine: 1.022 (ref 1.005–1.030)
pH: 6.5 (ref 5.0–8.0)

## 2015-12-06 LAB — CBC WITH DIFFERENTIAL/PLATELET
Basophils Absolute: 0 10*3/uL (ref 0.0–0.1)
Basophils Relative: 0 %
EOS ABS: 0.1 10*3/uL (ref 0.0–0.7)
EOS PCT: 1 %
HCT: 37.8 % (ref 36.0–46.0)
Hemoglobin: 12.8 g/dL (ref 12.0–15.0)
LYMPHS ABS: 1.9 10*3/uL (ref 0.7–4.0)
LYMPHS PCT: 33 %
MCH: 27.5 pg (ref 26.0–34.0)
MCHC: 33.9 g/dL (ref 30.0–36.0)
MCV: 81.3 fL (ref 78.0–100.0)
MONO ABS: 0.4 10*3/uL (ref 0.1–1.0)
MONOS PCT: 7 %
Neutro Abs: 3.4 10*3/uL (ref 1.7–7.7)
Neutrophils Relative %: 59 %
PLATELETS: 183 10*3/uL (ref 150–400)
RBC: 4.65 MIL/uL (ref 3.87–5.11)
RDW: 13.1 % (ref 11.5–15.5)
WBC: 5.7 10*3/uL (ref 4.0–10.5)

## 2015-12-06 LAB — LIPASE, BLOOD: LIPASE: 18 U/L (ref 11–51)

## 2015-12-06 MED ORDER — DIATRIZOATE MEGLUMINE & SODIUM 66-10 % PO SOLN
15.0000 mL | Freq: Once | ORAL | Status: AC
  Administered 2015-12-06: 15 mL via ORAL

## 2015-12-06 MED ORDER — DICYCLOMINE HCL 20 MG PO TABS: 20.0000 mg | ORAL_TABLET | Freq: Two times a day (BID) | ORAL | Status: DC

## 2015-12-06 MED ORDER — SODIUM CHLORIDE 0.9 % IV BOLUS (SEPSIS)
1000.0000 mL | Freq: Once | INTRAVENOUS | Status: AC
  Administered 2015-12-06: 1000 mL via INTRAVENOUS

## 2015-12-06 MED ORDER — ONDANSETRON HCL 4 MG/2ML IJ SOLN
4.0000 mg | Freq: Once | INTRAMUSCULAR | Status: AC | PRN
  Administered 2015-12-06: 4 mg via INTRAVENOUS
  Filled 2015-12-06: qty 2

## 2015-12-06 MED ORDER — IOPAMIDOL (ISOVUE-300) INJECTION 61%
100.0000 mL | Freq: Once | INTRAVENOUS | Status: AC | PRN
  Administered 2015-12-06: 100 mL via INTRAVENOUS

## 2015-12-06 MED ORDER — MORPHINE SULFATE (PF) 4 MG/ML IV SOLN
4.0000 mg | Freq: Once | INTRAVENOUS | Status: AC | PRN
  Administered 2015-12-06: 4 mg via INTRAVENOUS
  Filled 2015-12-06: qty 1

## 2015-12-06 NOTE — Discharge Instructions (Signed)
Abdominal Pain, Adult Many things can cause abdominal pain. Usually, abdominal pain is not caused by a disease and will improve without treatment. It can often be observed and treated at home. Your health care provider will do a physical exam and possibly order blood tests and X-rays to help determine the seriousness of your pain. However, in many cases, more time must pass before a clear cause of the pain can be found. Before that point, your health care provider may not know if you need more testing or further treatment. HOME CARE INSTRUCTIONS Monitor your abdominal pain for any changes. The following actions may help to alleviate any discomfort you are experiencing:  Only take over-the-counter or prescription medicines as directed by your health care provider.  Do not take laxatives unless directed to do so by your health care provider.  Try a clear liquid diet (broth, tea, or water) as directed by your health care provider. Slowly move to a bland diet as tolerated. SEEK MEDICAL CARE IF:  You have unexplained abdominal pain.  You have abdominal pain associated with nausea or diarrhea.  You have pain when you urinate or have a bowel movement.  You experience abdominal pain that wakes you in the night.  You have abdominal pain that is worsened or improved by eating food.  You have abdominal pain that is worsened with eating fatty foods.  You have a fever. SEEK IMMEDIATE MEDICAL CARE IF:  Your pain does not go away within 2 hours.  You keep throwing up (vomiting).  Your pain is felt only in portions of the abdomen, such as the right side or the left lower portion of the abdomen.  You pass bloody or black tarry stools. MAKE SURE YOU:  Understand these instructions.  Will watch your condition.  Will get help right away if you are not doing well or get worse.   This information is not intended to replace advice given to you by your health care provider. Make sure you discuss  any questions you have with your health care provider.   Document Released: 03/31/2005 Document Revised: 03/12/2015 Document Reviewed: 02/28/2013 Elsevier Interactive Patient Education 2016 Else[employer]  Pulmonary Nodule A pulmonary nodule is a small, round growth of tissue in the lung. Pulmonary nodules can range in size from less than 1/5 inch (4 mm) to a little bigger than an inch (25 mm). Most pulmonary nodules are detected when imaging tests of the lung are being performed for a different problem. Pulmonary nodules are usually not cancerous (benign). However, some pulmonary nodules are cancerous (malignant). Follow-up treatment or testing is based on the size of the pulmonary nodule and your risk of getting lung cancer.  CAUSES Benign pulmonary nodules can be caused by various things. Some of the causes include:   Bacterial, fungal, or viral infections. This is usually an old infection that is no longer active, but it can sometimes be a current, active infection.  A benign mass of tissue.  Inflammation from conditions such as rheumatoid arthritis.   Abnormal blood vessels in the lungs. Malignant pulmonary nodules can result from lung cancer or from cancers that spread to the lung from other places in the body. SIGNS AND SYMPTOMS Pulmonary nodules usually do not cause symptoms. DIAGNOSIS Most often, pulmonary nodules are found incidentally when an X-ray or CT scan is performed to look for some other problem in the lung area. To help determine whether a pulmonary nodule is benign or malignant, your health care provider will  take a medical history and order a variety of tests. Tests done may include:   Blood tests.  A skin test called a tuberculin test. This test is used to determine if you have been exposed to the germ that causes tuberculosis.   Chest X-rays. If possible, a new X-ray may be compared with X-rays you have had in the past.   CT scan. This test shows smaller  pulmonary nodules more clearly than an X-ray.   Positron emission tomography (PET) scan. In this test, a safe amount of a radioactive substance is injected into the bloodstream. Then, the scan takes a picture of the pulmonary nodule. The radioactive substance is eliminated from your body in your urine.   Biopsy. A tiny piece of the pulmonary nodule is removed so it can be checked under a microscope. TREATMENT  Pulmonary nodules that are benign normally do not require any treatment because they usually do not cause symptoms or breathing problems. Your health care provider may want to monitor the pulmonary nodule through follow-up CT scans. The frequency of these CT scans will vary based on the size of the nodule and the risk factors for lung cancer. For example, CT scans will need to be done more frequently if the pulmonary nodule is larger and if you have a history of smoking and a family history of cancer. Further testing or biopsies may be done if any follow-up CT scan shows that the size of the pulmonary nodule has increased. HOME CARE INSTRUCTIONS  Only take over-the-counter or prescription medicines as directed by your health care provider.  Keep all follow-up appointments with your health care provider. SEEK MEDICAL CARE IF:  You have trouble breathing when you are active.   You feel sick or unusually tired.   You do not feel like eating.   You lose weight without trying to.   You develop chills or night sweats.  SEEK IMMEDIATE MEDICAL CARE IF:  You cannot catch your breath, or you begin wheezing.   You cannot stop coughing.   You cough up blood.   You become dizzy or feel like you are going to pass out.   You have sudden chest pain.   You have a fever or persistent symptoms for more than 2-3 days.   You have a fever and your symptoms suddenly get worse. MAKE SURE YOU:  Understand these instructions.  Will watch your condition.  Will get help right away  if you are not doing well or get worse.   This information is not intended to replace advice given to you by your health care provider. Make sure you discuss any questions you have with your health care provider.   Document Released: 04/18/2009 Document Revised: 02/21/2013 Document Reviewed: 12/11/2012 Elsevier Interactive Patient Education [employer].

## 2015-12-06 NOTE — ED Notes (Signed)
Pt has a hx of IBS and is c/o discomfort, constipation with episodes of diarrhea. Reports a burning sensation in the middle of abdomen and a pain/soreness on the left side making it difficult for her to sit comfortably.

## 2015-12-06 NOTE — ED Provider Notes (Signed)
CSN: [id number]     Arrival date & time 12/06/15  [id number] History   First MD Initiated Contact with Patient 12/06/15 [phone]     Chief Complaint  Patient presents with  . Abdominal Pain     (Consider location/radiation/quality/duration/timing/severity/associated sxs/prior Treatment) HPI Kendra Gilbert is a(n) 60 y.o. female who presents with cc of Left sided abdominal pain. Patient was seen on 11/18/2015 for c/o abdominal pain and rectal bleeding. She has a pmh Of rheumatoid arthritis, irritable bowel syndrome, abdominal hysterectomy, endometriosis. She complains of pain since her last visit on 516 which has been progressively worsening. She states that it is localized to her left upper and lower quadrant. She has associated pelvic cramping and rectal pain. She states that it feels like her previous episodes of endometriosis. However, she states that she does not get that type of pain anymore after menopause.. She last BM was yesterday and was watery. She ususally alternates between watery diarrhea and constipation. She denies hematochezia or melena. She has had associated nausea and decreased appetite. She has some myalgia and malaise as well. Denies fevers. She denies vomiting. She denies urinary symptoms. Past Medical History  Diagnosis Date  . Pneumonia   . Lung disease   . Asthma   . Rheumatoid arthritis ([place])   . H/O: rheumatic fever    Past Surgical History  Procedure Laterality Date  . Abdominal hysterectomy     Family History  Problem Relation Age of Onset  . CAD Mother 57    CABG/stents  . Breast cancer Mother   . Asthma Father   . CAD Father 70  . Allergies Father   . CAD Brother 45    Died with MI  . CAD Brother 30    9 stents  . CAD Brother 12    7 stents   Social History  Substance Use Topics  . Smoking status: Never Smoker   . Smokeless tobacco: Never Used  . Alcohol Use: No   OB History    No data available     Review of Systems  Ten systems reviewed and  are negative for acute change, except as noted in the HPI.    Allergies  Bee venom; Other; Aspirin; Erythromycin; and Penicillins  Home Medications   Prior to Admission medications   Medication Sig Start Date End Date Taking? Authorizing Provider  albuterol (PROVENTIL HFA;VENTOLIN HFA) 108 (90 BASE) MCG/ACT inhaler Inhale 2 puffs into the lungs 3 (three) times daily as needed for wheezing or shortness of breath. Reported on 12/06/2015    Historical Provider, MD  albuterol (PROVENTIL) (2.5 MG/3ML) 0.083% nebulizer solution Take 2.5 mg by nebulization 3 (three) times daily as needed for wheezing or shortness of breath. Reported on 12/06/2015    Historical Provider, MD  atorvastatin (LIPITOR) 40 MG tablet Take 1 tablet (40 mg total) by mouth daily. Patient not taking: Reported on 11/18/2015 09/04/13   Minus Breeding, MD  Azelastine-Fluticasone [facility]) 137-50 MCG/ACT SUSP Place 2 sprays into the nose 3 (three) times daily as needed (congestion). Reported on 12/06/2015    Historical Provider, MD  dicyclomine (BENTYL) 20 MG tablet Take 1 tablet (20 mg total) by mouth 2 (two) times daily. 12/06/15   Margarita Mail, PA-C  polyethylene glycol-electrolytes (NULYTELY/GOLYTELY) 420 g solution Reported on 12/06/2015 11/30/15   Historical Provider, MD   BP 142/64 mmHg  Pulse 55  Temp(Src) 97.6 F (36.4 C) (Oral)  Resp 15  SpO2 99% Physical Exam  Constitutional: She is oriented to  person, place, and time. She appears well-developed and well-nourished. No distress.  HENT:  Head: Normocephalic and atraumatic.  Eyes: Conjunctivae are normal. No scleral icterus.  Neck: Normal range of motion.  Cardiovascular: Normal rate, regular rhythm and normal heart sounds.  Exam reveals no gallop and no friction rub.   No murmur heard. Pulmonary/Chest: Effort normal and breath sounds normal. No respiratory distress.  Abdominal: Soft. Normal appearance and bowel sounds are normal. She exhibits no distension and no mass. There  is tenderness in the left lower quadrant. There is no rigidity, no rebound and no guarding.    Neurological: She is alert and oriented to person, place, and time.  Skin: Skin is warm and dry. She is not diaphoretic.  Nursing note and vitals reviewed.   ED Course  Procedures (including critical care time) Labs Review Labs Reviewed  COMPREHENSIVE METABOLIC PANEL - Abnormal; Notable for the following:    Total Bilirubin <0.1 (*)    All other components within normal limits  CBC WITH DIFFERENTIAL/PLATELET  LIPASE, BLOOD  URINALYSIS, ROUTINE W REFLEX MICROSCOPIC (NOT AT [facility])    Imaging Review No results found. I have personally reviewed and evaluated these images and lab results as part of my medical decision-making.   EKG Interpretation None      MDM   Final diagnoses:  Left lower quadrant pain  Incidental lung nodule   Filed Vitals:   12/06/15 0725 12/06/15 0919 12/06/15 1000  BP: 161/80 143/77 142/64  Pulse: 70 98 55  Temp: 98.2 F (36.8 C)  97.6 F (36.4 C)  TempSrc: Oral  Oral  Resp: 16 14 15   SpO2: 100% 99% 99%    Patient imaging negative for acute abnormality. + for incidental lung nodules and fattly liver.  Her labs are all wnl,  HX of IBS.  No pain control required. No signs of divericulitis or other intrabdominal etiology. No cp, SOB. Patient will be d/c with bentyl and zofran. F/u with pcp. Discussed lung nodules and need for repeat scan.  Also answered all patinent questions to the best of my ability  Margarita Mail, PA-C 12/08/15 [id number]  Milton Ferguson, MD 12/08/15 [phone]

## 2015-12-06 NOTE — ED Notes (Signed)
I attempted x1 to obtain blood, I was unsuccessful x1. Another tech will try.

## 2015-12-15 ENCOUNTER — Other Ambulatory Visit: Payer: Self-pay | Admitting: Family Medicine

## 2015-12-15 DIAGNOSIS — R911 Solitary pulmonary nodule: Secondary | ICD-10-CM

## 2015-12-25 ENCOUNTER — Ambulatory Visit
Admission: RE | Admit: 2015-12-25 | Discharge: 2015-12-25 | Disposition: A | Discharge status: Home / Self Care | Payer: BLUE CROSS/BLUE SHIELD | Source: Ambulatory Visit | Attending: Family Medicine | Admitting: Family Medicine

## 2015-12-25 DIAGNOSIS — R911 Solitary pulmonary nodule: Secondary | ICD-10-CM

## 2016-01-05 ENCOUNTER — Telehealth: Payer: Self-pay | Admitting: Cardiology

## 2016-01-05 NOTE — Telephone Encounter (Signed)
Received records from [place] for appointment on 02/06/16 with Dr Percival Spanish.  Records given to [facility] (medical records) for Dr Hochrein's schedule on 02/06/16. lp

## 2016-01-14 ENCOUNTER — Telehealth: Payer: Self-pay | Admitting: Internal Medicine

## 2016-01-14 NOTE — Telephone Encounter (Signed)
Received appointment request from Dr. Leighton Ruff to schedule appt for patient for lung nodule seen on imaging study.  Called and LVM for patient to call and schedule appointment. Paperwork pinned to board beside doctor folders.

## 2016-01-20 ENCOUNTER — Encounter: Payer: Self-pay | Admitting: Internal Medicine

## 2016-01-20 ENCOUNTER — Ambulatory Visit (INDEPENDENT_AMBULATORY_CARE_PROVIDER_SITE_OTHER): Payer: BLUE CROSS/BLUE SHIELD | Admitting: Internal Medicine

## 2016-01-20 VITALS — BP 150/88 | HR 80 | Ht 59.0 in | Wt 149.0 lb

## 2016-01-20 DIAGNOSIS — R05 Cough: Secondary | ICD-10-CM

## 2016-01-20 DIAGNOSIS — R918 Other nonspecific abnormal finding of lung field: Secondary | ICD-10-CM | POA: Insufficient documentation

## 2016-01-20 DIAGNOSIS — R0600 Dyspnea, unspecified: Secondary | ICD-10-CM | POA: Diagnosis not present

## 2016-01-20 DIAGNOSIS — R058 Other specified cough: Secondary | ICD-10-CM

## 2016-01-20 NOTE — Assessment & Plan Note (Signed)
Symptoms are markedly disproportionate to objective findings and not clear this is a lung problem but pt does appear to have difficult airway management issues. DDX of  difficult airways management almost all start with A and  include Adherence, Ace Inhibitors, Acid Reflux, Active Sinus Disease, Alpha 1 Antitripsin deficiency, Anxiety masquerading as Airways dz,  ABPA,  Allergy(esp in young), Aspiration (esp in elderly), Adverse effects of meds,  Active smokers, A bunch of PE's (a small clot burden can't cause this syndrome unless there is already severe underlying pulm or vascular dz with poor reserve) plus two Bs  = Bronchiectasis and Beta blocker use..and one C= CHF  ? Acid (or non-acid) GERD > always difficult to exclude as up to 75% of pts in some series report no assoc GI/ Heartburn symptoms> rec max (24h)  acid suppression and diet restrictions/ reviewed and instructions given in writing.   ? Allergy/asthma > did not resp to rx in past, cough on insp is against cough variant asthma  ? ? Anxiety > usually at the bottom of this list of usual suspects but should be much higher on this pt's based on H and P >  Follow up per Primary Care planned    ? chf / ? H/o Rheumatic fever but no audible murmur or abn S1 or S2 > f/u Cards planned for 02/2016 ? Needs echo.  For now will focus on rx of cough and let her complete her cards w/u and if dx in doubt then proceed to cpst

## 2016-01-20 NOTE — Patient Instructions (Signed)
Pepcid ac 20 mg at bedtime   For drainage / throat tickle try take CHLORPHENIRAMINE  4 mg - take one every 4 hours as needed - available over the counter- may cause drowsiness so start with just a bedtime dose or two and see how you tolerate it before trying in daytime    GERD (REFLUX)  is an extremely common cause of respiratory symptoms just like yours , many times with no obvious heartburn at all.    It can be treated with medication, but also with lifestyle changes including elevation of the head of your bed (ideally with 6 inch  bed blocks),  Smoking cessation, avoidance of late meals, excessive alcohol, and avoid fatty foods, chocolate, peppermint, colas, red wine, and acidic juices such as orange juice.  NO MINT OR MENTHOL PRODUCTS SO NO COUGH DROPS  USE SUGARLESS CANDY INSTEAD (Jolley ranchers or Stover's or Life Savers) or even ice chips will also do - the key is to swallow to prevent all throat clearing. NO OIL BASED VITAMINS - use powdered substitutes.   Please schedule a follow up office visit in 6 weeks, call sooner if needed

## 2016-01-20 NOTE — Assessment & Plan Note (Signed)
See CT chest  12/25/15 largest = 3 mm/ never smoker > no f/u rec by [employer] society guidelines     CT results reviewed with pt >>> Too small for PET or bx, not suspicious enough for excisional bx > really only option for now is follow the [employer]ischner society guidelines as rec by radiology = no further f/u unless symptom directed   Discussed in detail all the  indications, usual  risks and alternatives  relative to the benefits with patient who agrees to proceed with conservative f/u as outlined

## 2016-01-20 NOTE — Assessment & Plan Note (Signed)
The most common causes of chronic cough in immunocompetent adults include the following: upper airway cough syndrome (UACS), previously referred to as postnasal drip syndrome (PNDS), which is caused by variety of rhinosinus conditions; (2) asthma; (3) GERD; (4) chronic bronchitis from cigarette smoking or other inhaled environmental irritants; (5) nonasthmatic eosinophilic bronchitis; and (6) bronchiectasis.   These conditions, singly or in combination, have accounted for up to 94% of the causes of chronic cough in prospective studies.   Other conditions have constituted no >6% of the causes in prospective studies These have included bronchogenic carcinoma, chronic interstitial pneumonia, sarcoidosis, left ventricular failure, ACEI-induced cough, and aspiration from a condition associated with pharyngeal dysfunction.    Chronic cough is often simultaneously caused by more than one condition. A single cause has been found from 38 to 82% of the time, multiple causes from 18 to 62%. Multiply caused cough has been the result of three diseases up to 42% of the time.       Based on hx and exam, this is most likely:  Classic Upper airway cough syndrome, so named because it's frequently impossible to sort out how much is  CR/sinusitis with freq throat clearing (which can be related to primary GERD)   vs  causing  secondary (" extra esophageal")  GERD from wide swings in gastric pressure that occur with throat clearing, often  promoting self use of mint and menthol lozenges that reduce the lower esophageal sphincter tone and exacerbate the problem further in a cyclical fashion.   These are the same pts (now being labeled as having "irritable larynx syndrome" by some cough centers) who not infrequently have a history of having failed to tolerate ace inhibitors,  dry powder inhalers or biphosphonates or report having atypical reflux symptoms that don't respond to standard doses of PPI , and are easily confused as  having aecopd or asthma flares by even experienced allergists/ pulmonologists.   The first step is to maximize acid suppression and gerd diet including elimination of all mint products  then regroup in 6 weeks   I had an extended discussion with the patient reviewing all relevant studies completed to date and  lasting 35 min/ 60 min ov  1) Explained: The standardized cough guidelines published in Chest by Lissa Morales in 2006 are still the best available and consist of a multiple step process (up to 12!) , not a single office visit,  and are intended  to address this problem logically,  with an alogrithm dependent on response to empiric treatment at  each progressive step  to determine a specific diagnosis with  minimal addtional testing needed. Therefore if adherence is an issue or can't be accurately verified,  it's very unlikely the standard evaluation and treatment will be successful here.    Furthermore, response to therapy (other than acute cough suppression, which should only be used short term with avoidance of narcotic containing cough syrups if possible), can be a gradual process for which the patient may perceive immediate benefit.  Unlike going to an eye doctor where the best perscription is almost always the first one and is immediately effective, this is almost never the case in the management of chronic cough syndromes. Therefore the patient needs to commit up front to consistently adhere to recommendations  for up to 6 weeks of therapy directed at the likely underlying problem(s) before the response can be reasonably evaluated.     2) Each maintenance medication was reviewed in detail including most importantly  the difference between maintenance and prns and under what circumstances the prns are to be triggered using an action plan format that is not reflected in the computer generated alphabetically organized AVS.    Please see instructions for details which were reviewed in writing  and the patient given a copy highlighting the part that I personally wrote and discussed at today's ov.   See instructions for specific recommendations which were reviewed directly with the patient who was given a copy with highlighter outlining the key components.

## 2016-01-20 NOTE — Progress Notes (Signed)
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 Pulmonary Consult    Referred by Dr. Leighton Gilbert for eval of pulmonary nodule. She c/o right side back pain since June 2017. She states that the pain occurs with exertion and also when she coughs. She was seen here for cough in 2015 and states her cough is unchanged since then.   walks daily does ok ok flat for up to an hour good pace / hills are a problem/ carrying objects Cough is worse p supper and when lies down and pretty much every night/ wakes up each am maybe a tsp Back pain is localized just to R of midline really dates back to 2011/ aggravated by coughing and worse since cough has been worse.  Pred/ rx for asthma/ rhinitis never convincingly helped cough or sob   No obvious day to day or daytime variability or assoc excess/ purulent sputum or mucus plugs   Or  chest tightness, subjective wheeze or overt sinus or hb symptoms. No unusual exp hx or h/o childhood pna/ asthma or knowledge of premature birth.  Also denies any obvious fluctuation of symptoms with weather or environmental changes or other aggravating or alleviating factors except as outlined above   Current  Medications, Allergies, Complete Past Medical History, Past Surgical History, Family History, and Social History were reviewed in [employer] record.  ROS  The following are not active complaints unless bolded sore throat, dysphagia, dental problems, itching, sneezing,  nasal congestion or excess/ purulent secretions, ear ache,   fever, chills, sweats, unintended wt loss, classically pleuritic or exertional cp, hemoptysis,  orthopnea pnd or leg swelling, presyncope, palpitations, abdominal pain, anorexia, nausea, vomiting, diarrhea  or change in bowel or bladder habits, change in stools or urine, dysuria,hematuria,  rash, arthralgias, visual complaints, headache, numbness, weakness or ataxia or problems with walking or coordination,  change in mood/affect or memory.               Review of Systems     Objective:   Physical Exam    amb wf nad chewing mint gum / very unusual affect, answers almost every question with "Again...Marland KitchenMarland Kitchen"  Wt Readings from Last 3 Encounters:  01/20/16 149 lb (67.586 kg)  08/23/13 143 lb (64.864 kg)  08/17/13 145 lb (65.772 kg)    Vital signs reviewed   HEENT: nl dentition, turbinates, and oropharynx. Nl external ear canals without cough reflex   NECK :  without JVD/Nodes/TM/ nl carotid upstrokes bilaterally   LUNGS: no acc muscle use,  Nl contour chest which is clear to A and P bilaterally  Early on inspiration   CV:  RRR  no s3 or murmur or increase in P2, no edema   ABD:  soft and nontender with nl inspiratory excursion in the supine position. No bruits or organomegaly, bowel sounds nl  MS:  Nl gait/ ext warm without deformities, calf tenderness, cyanosis or clubbing No obvious joint restrictions   SKIN: warm and dry without lesions    NEURO:  alert, approp, nl sensorium with  no motor deficits      I personally reviewed images and agree with radiology impression as follows:  CT Chest   12/25/15 1. Small nodules are  again identified. The largest has a mean diameter of 3 mm. No follow-up needed if patient is low-risk (and has no known or suspected primary neoplasm). Non-contrast chest CT can be considered in 12 months if patient is high-risk.  Assessment:

## 2016-02-04 NOTE — Progress Notes (Signed)
Cardiology Office Note   Date:  02/06/2016   ID:  Kendra Gilbert, DOB [birth year], MRN [id number]  PCP:  Gerrit Heck, MD  Cardiologist:   Minus Breeding, MD   No chief complaint on file.     History of Present Illness: Kendra Gilbert is a 60 y.o. female who presents for follow up of coronary calcium.   I last saw the patient in 2015.  At that time she presented for evaluaiton of chest discomfort and a history of coronary calcification. She was found to have coronary calcification in 2012 when she was being treated and evaluated for upper respiratory infections. She says that at that time she had an exercise treadmill test which was apparently normal. She had a history of rheumatic fever as a child but was never told that she had any valvular disease.  She has a very dramatic family history of early CAD.   When I saw her in 2015 she had a negative POET (Plain Old Exercise Treadmill).    Since I last saw her she has had lots of GI problems and has had a cancerous colonic polyp removed.  She is being followed for small pulmonary tumors.  She was noted on a more recent CT to again have coronary caclium and also aortic atherosclerosis.  The patient denies any new symptoms such as chest discomfort, neck or arm discomfort. There has been no new shortness of breath, PND or orthopnea. There have been no reported palpitations, presyncope or syncope.  She walks daily and she has no symptoms.  She might rarely get some fleeting left shoulder pain.    Past Medical History:  Diagnosis Date  . Asthma   . Colon cancer ([place])   . H/O: rheumatic fever   . Lung disease    Lung nodule  . Pneumonia   . Rheumatoid arthritis [facility])     Past Surgical History:  Procedure Laterality Date  . ABDOMINAL HYSTERECTOMY       No current outpatient prescriptions on file.   No current facility-administered medications for this visit.     Allergies:   Bee venom; Other; Aspirin;  Erythromycin; and Penicillins    ROS:  Please see the history of present illness.   Otherwise, review of systems are positive for none.   All other systems are reviewed and negative.    PHYSICAL EXAM: VS:  BP 126/80 (BP Location: Left Arm, Patient Position: Sitting, Cuff Size: Normal)   Pulse 66   Ht 4\' 11"  (1.499 m)   Wt 149 lb 12.8 oz (67.9 kg)   BMI 30.26 kg/m  , BMI Body mass index is 30.26 kg/m. GENERAL:  Well appearing HEENT:  Pupils equal round and reactive, fundi not visualized, oral mucosa unremarkable NECK:  No jugular venous distention, waveform within normal limits, carotid upstroke brisk and symmetric, no bruits, no thyromegaly LYMPHATICS:  No cervical, inguinal adenopathy LUNGS:  Clear to auscultation bilaterally BACK:  No CVA tenderness CHEST:  Unremarkable HEART:  PMI not displaced or sustained,S1 and S2 within normal limits, no S3, no S4, no clicks, no rubs, no murmurs ABD:  Flat, positive bowel sounds normal in frequency in pitch, no bruits, no rebound, no guarding, no midline pulsatile mass, no hepatomegaly, no splenomegaly EXT:  2 plus pulses throughout, no edema, no cyanosis no clubbing SKIN:  No rashes no nodules NEURO:  Cranial nerves II through XII grossly intact, motor grossly intact throughout PSYCH:  Cognitively intact, oriented to person place and time  EKG:  EKG is ordered today. The ekg ordered today demonstrates sinus rhythm, rate 66, axis within normal limits, intervals within normal limits, no acute ST-T wave changes.   Recent Labs: 12/06/2015: ALT 21; BUN 14; Creatinine, Ser 0.59; Hemoglobin 12.8; Platelets 183; Potassium 3.5; Sodium 143    Lipid Panel    Component Value Date/Time   CHOL 213 (H) 08/23/2013 1142   TRIG 88.0 08/23/2013 1142   HDL 66.30 08/23/2013 1142   CHOLHDL 3 08/23/2013 1142   VLDL 17.6 08/23/2013 1142   LDLDIRECT 135.2 08/23/2013 1142      Wt Readings from Last 3 Encounters:  02/06/16 149 lb 12.8 oz (67.9 kg)    01/20/16 149 lb (67.6 kg)  08/23/13 143 lb (64.9 kg)      Other studies Reviewed: Additional studies/ records that were reviewed today include: Office records, CTs and pulmonary records. . Review of the above records demonstrates:  Please see elsewhere in the note.     ASSESSMENT AND PLAN:  CORONARY CALCIUM:  The patient has significant risk factors. She has a tremendous family history of early coronary disease and she clearly has some plaque given her coronary calcification. However, there is no suggestion that this is obstructive. She has a very high functional level and exercise and has no symptoms. Screening with an exercise treadmill test and doing this routinely would be reasonable. She and I discussed continued risk reduction.  DYSLIPIDEMIA:  She has had dyslipidemia but hasn't been checked in the recent past. I'm going to check a fasting lipid profile. I think her into taking a statin which I think is indicated given her extensive risk factors. I will wait to pick the correct therapy based on the lab results but I'm going to try for a goal LDL of 70.  HISTORY OF RHD:  She is had this history is a child that she has actually no symptoms or heart murmurs. I'll strongly suspect structural heart disease. I'll follow this clinically.   Current medicines are reviewed at length with the patient today.  The patient does not have concerns regarding medicines.  The following changes have been made:  no change  Labs/ tests ordered today include:   Orders Placed This Encounter  Procedures  . Lipid Profile  . EXERCISE TOLERANCE TEST  . EKG 12-Lead     Disposition:   FU with me in one year.     Signed, Minus Breeding, MD  02/06/2016 10:55 AM    [facility]

## 2016-02-06 ENCOUNTER — Encounter: Payer: Self-pay | Admitting: Cardiology

## 2016-02-06 ENCOUNTER — Ambulatory Visit (INDEPENDENT_AMBULATORY_CARE_PROVIDER_SITE_OTHER): Payer: BLUE CROSS/BLUE SHIELD | Admitting: Cardiology

## 2016-02-06 VITALS — BP 126/80 | HR 66 | Ht 59.0 in | Wt 149.8 lb

## 2016-02-06 DIAGNOSIS — E785 Hyperlipidemia, unspecified: Secondary | ICD-10-CM

## 2016-02-06 DIAGNOSIS — I2584 Coronary atherosclerosis due to calcified coronary lesion: Secondary | ICD-10-CM

## 2016-02-06 DIAGNOSIS — I2510 Atherosclerotic heart disease of native coronary artery without angina pectoris: Secondary | ICD-10-CM | POA: Diagnosis not present

## 2016-02-06 LAB — LIPID PANEL
Cholesterol: 228 mg/dL — ABNORMAL HIGH (ref 125–200)
HDL: 69 mg/dL (ref >=46)
LDL Cholesterol: 140 mg/dL — ABNORMAL HIGH (ref <130)
TRIGLYCERIDES: 93 mg/dL (ref <150)
Total CHOL/HDL Ratio: 3.3 Ratio (ref <=5.0)
VLDL: 19 mg/dL (ref <30)

## 2016-02-06 NOTE — Patient Instructions (Signed)
Medication Instructions:  Continue current medications  Labwork: Fasting Lipids  Testing/Procedures: Your physician has requested that you have an exercise tolerance test. For further information please visit [email or web address]. Please also follow instruction sheet, as given.  Follow-Up: Your physician wants you to follow-up in: 1 Year. You will receive a reminder letter in the mail two months in advance. If you don't receive a letter, please call our office to schedule the follow-up appointment.   Any Other Special Instructions Will Be Listed Below (If Applicable).   If you need a refill on your cardiac medications before your next appointment, please call your pharmacy.

## 2016-02-09 ENCOUNTER — Telehealth: Payer: Self-pay | Admitting: *Deleted

## 2016-02-09 ENCOUNTER — Other Ambulatory Visit: Payer: Self-pay | Admitting: Family Medicine

## 2016-02-09 DIAGNOSIS — E785 Hyperlipidemia, unspecified: Secondary | ICD-10-CM

## 2016-02-09 DIAGNOSIS — Z79899 Other long term (current) drug therapy: Secondary | ICD-10-CM

## 2016-02-09 DIAGNOSIS — Z1231 Encounter for screening mammogram for malignant neoplasm of breast: Secondary | ICD-10-CM

## 2016-02-09 MED ORDER — ROSUVASTATIN CALCIUM 20 MG PO TABS: 20.0000 mg | ORAL_TABLET | Freq: Every day | ORAL | 11 refills | Status: DC

## 2016-02-09 NOTE — Telephone Encounter (Signed)
Spoke with pt about her blood work, crestor 20 mg was send into pt pharmacy and blood work was order and mail to pt. Pt verbally understand.

## 2016-02-09 NOTE — Telephone Encounter (Signed)
-----   Message from Minus Breeding, MD sent at 02/08/2016 11:02 AM EDT ----- With her extensive family history and known coronary calcification I would suggest that she start Crestor 20 mg po once daily.  Disp number 31 with 11 refills.  Repeat lipid profile and liver enzymes in 11 weeks.  Call Ms. Peaden with the results and send results to Gerrit Heck, MD

## 2016-02-10 ENCOUNTER — Encounter: Payer: Self-pay | Admitting: Cardiology

## 2016-02-17 ENCOUNTER — Encounter (HOSPITAL_COMMUNITY): Payer: Self-pay

## 2016-02-17 ENCOUNTER — Observation Stay (HOSPITAL_COMMUNITY)
Admission: EM | Admit: 2016-02-17 | Discharge: 2016-02-18 | Disposition: A | Discharge status: Home / Self Care | Payer: BLUE CROSS/BLUE SHIELD | Attending: Internal Medicine | Admitting: Internal Medicine

## 2016-02-17 ENCOUNTER — Emergency Department (HOSPITAL_COMMUNITY): Payer: BLUE CROSS/BLUE SHIELD

## 2016-02-17 DIAGNOSIS — M069 Rheumatoid arthritis, unspecified: Secondary | ICD-10-CM | POA: Insufficient documentation

## 2016-02-17 DIAGNOSIS — Z6830 Body mass index (BMI) 30.0-30.9, adult: Secondary | ICD-10-CM | POA: Insufficient documentation

## 2016-02-17 DIAGNOSIS — E669 Obesity, unspecified: Secondary | ICD-10-CM | POA: Insufficient documentation

## 2016-02-17 DIAGNOSIS — R079 Chest pain, unspecified: Secondary | ICD-10-CM | POA: Diagnosis present

## 2016-02-17 DIAGNOSIS — E785 Hyperlipidemia, unspecified: Secondary | ICD-10-CM | POA: Diagnosis present

## 2016-02-17 DIAGNOSIS — R200 Anesthesia of skin: Secondary | ICD-10-CM | POA: Diagnosis not present

## 2016-02-17 DIAGNOSIS — Z8249 Family history of ischemic heart disease and other diseases of the circulatory system: Secondary | ICD-10-CM

## 2016-02-17 DIAGNOSIS — I10 Essential (primary) hypertension: Secondary | ICD-10-CM | POA: Insufficient documentation

## 2016-02-17 DIAGNOSIS — R072 Precordial pain: Secondary | ICD-10-CM

## 2016-02-17 DIAGNOSIS — Z85038 Personal history of other malignant neoplasm of large intestine: Secondary | ICD-10-CM | POA: Diagnosis not present

## 2016-02-17 DIAGNOSIS — I2510 Atherosclerotic heart disease of native coronary artery without angina pectoris: Secondary | ICD-10-CM | POA: Diagnosis present

## 2016-02-17 DIAGNOSIS — I2584 Coronary atherosclerosis due to calcified coronary lesion: Secondary | ICD-10-CM

## 2016-02-17 DIAGNOSIS — R0789 Other chest pain: Principal | ICD-10-CM | POA: Insufficient documentation

## 2016-02-17 DIAGNOSIS — K219 Gastro-esophageal reflux disease without esophagitis: Secondary | ICD-10-CM | POA: Insufficient documentation

## 2016-02-17 HISTORY — DX: Coronary atherosclerosis due to calcified coronary lesion: I25.84

## 2016-02-17 HISTORY — DX: Atherosclerotic heart disease of native coronary artery without angina pectoris: I25.10

## 2016-02-17 LAB — HEPATIC FUNCTION PANEL
ALBUMIN: 3.9 g/dL (ref 3.5–5.0)
ALT: 28 U/L (ref 14–54)
AST: 26 U/L (ref 15–41)
Alkaline Phosphatase: 94 U/L (ref 38–126)
Bilirubin, Direct: 0.2 mg/dL (ref 0.1–0.5)
Indirect Bilirubin: 0.4 mg/dL (ref 0.3–0.9)
Total Bilirubin: 0.6 mg/dL (ref 0.3–1.2)
Total Protein: 8 g/dL (ref 6.5–8.1)

## 2016-02-17 LAB — CBC
HCT: 40.3 % (ref 36.0–46.0)
Hemoglobin: 13.1 g/dL (ref 12.0–15.0)
MCH: 27.3 pg (ref 26.0–34.0)
MCHC: 32.5 g/dL (ref 30.0–36.0)
MCV: 84 fL (ref 78.0–100.0)
PLATELETS: 184 10*3/uL (ref 150–400)
RBC: 4.8 MIL/uL (ref 3.87–5.11)
RDW: 13.2 % (ref 11.5–15.5)
WBC: 5.9 10*3/uL (ref 4.0–10.5)

## 2016-02-17 LAB — I-STAT TROPONIN, ED: TROPONIN I, POC: 0 ng/mL (ref 0.00–0.08)

## 2016-02-17 LAB — BASIC METABOLIC PANEL
Anion gap: 7 (ref 5–15)
BUN: 12 mg/dL (ref 6–20)
CO2: 27 mmol/L (ref 22–32)
CREATININE: 0.53 mg/dL (ref 0.44–1.00)
Calcium: 9.2 mg/dL (ref 8.9–10.3)
Chloride: 106 mmol/L (ref 101–111)
GFR calc Af Amer: >60 mL/min (ref >60)
Glucose, Bld: 88 mg/dL (ref 65–99)
POTASSIUM: 3.8 mmol/L (ref 3.5–5.1)
SODIUM: 140 mmol/L (ref 135–145)

## 2016-02-17 LAB — TROPONIN I: Troponin I: <0.03 ng/mL (ref <0.03)

## 2016-02-17 MED ORDER — GI COCKTAIL ~~LOC~~: 30.0000 mL | Freq: Four times a day (QID) | ORAL | Status: DC | PRN

## 2016-02-17 MED ORDER — ALBUTEROL SULFATE (2.5 MG/3ML) 0.083% IN NEBU: 2.5000 mg | INHALATION_SOLUTION | RESPIRATORY_TRACT | Status: DC | PRN

## 2016-02-17 MED ORDER — NITROGLYCERIN 0.4 MG SL SUBL
0.4000 mg | SUBLINGUAL_TABLET | SUBLINGUAL | Status: DC | PRN
  Administered 2016-02-17: 0.4 mg via SUBLINGUAL
  Filled 2016-02-17: qty 1

## 2016-02-17 MED ORDER — ENOXAPARIN SODIUM 40 MG/0.4ML ~~LOC~~ SOLN
40.0000 mg | SUBCUTANEOUS | Status: DC
  Administered 2016-02-17: 40 mg via SUBCUTANEOUS
  Filled 2016-02-17: qty 0.4

## 2016-02-17 MED ORDER — ROSUVASTATIN CALCIUM 20 MG PO TABS
20.0000 mg | ORAL_TABLET | Freq: Every day | ORAL | Status: DC
  Administered 2016-02-17 – 2016-02-18 (×2): 20 mg via ORAL
  Filled 2016-02-17 (×2): qty 1

## 2016-02-17 MED ORDER — ONDANSETRON HCL 4 MG/2ML IJ SOLN: 4.0000 mg | Freq: Four times a day (QID) | INTRAMUSCULAR | Status: DC | PRN

## 2016-02-17 MED ORDER — ACETAMINOPHEN 325 MG PO TABS
650.0000 mg | ORAL_TABLET | ORAL | Status: DC | PRN
  Administered 2016-02-17 (×2): 650 mg via ORAL
  Filled 2016-02-17 (×2): qty 2

## 2016-02-17 NOTE — ED Notes (Signed)
Patient stated numbness/pain in left jaw including her teeth and radiating into her left arm.

## 2016-02-17 NOTE — ED Notes (Addendum)
PT CAN GO UP AT 15:30 CALL FIRST BECAUSE NURSE IN RAPID RESPONSE.

## 2016-02-17 NOTE — Consult Note (Signed)
Cardiology Consult    Patient ID: Kendra Gilbert MRN: [id number], DOB/AGE: 60-Jan-1957   Admit date: 02/17/2016 Date of Consult: 02/17/2016  Primary Physician: Gerrit Heck, MD Reason for Consult: Chest Pain Primary Cardiologist: Dr. Percival Spanish Requesting Provider: Dr. Candiss Norse   History of Present Illness    Kendra Gilbert is a 60 y.o. female with past medical history of RA, colon cancer, HLD, GERD and followed by Cardiology for coronary calcifications who presented to [facility] ED on 02/17/2016 for evaluation of chest pain.  Starting on 8/12, she felt lightheaded and had a numbness radiating into her jaw bilaterally. She developed a sternal chest discomfort and numbness down her left arm later that day. Her symptoms presented at rest and resolved spontaneously. Over the past few days, she has continued to have the jaw numbness and notes episodes of chest discomfort. Says this is worse with consuming foods and she develops a burning sensation in her esophagus. No association of her symptoms with exertion. No associated nausea, vomiting, dyspnea, or diaphoresis.   While admitted, WBC has been 5.9, Hgb 13.1, and platelets 184. Creatinine 0.53. Electrolytes WNL. Initial two troponin values have been negative. EKG shows NSR, HR 56, with no acute ST or T-wave changes. CXR with no active cardiopulmonary disease.   Recently seen by Dr. Percival Spanish on 02/06/2016 and denied any recent episodes of chest discomfort. Underwent an exercise treadmill test in 2012 which she reports was normal. Had a POET in 2015 which showed no significant ischemic changes. Scheduled for a repeat stress test on 8/22. Recent Lipid Panel showed LDL of 140 and she was started on Crestor 20mg  daily.  Reports a significant family history of CAD with one brother passing away of a massive MI at age 73 and another requiring CABG in his late-50's.  Past Medical History   Past Medical History:  Diagnosis Date  . Asthma   .  Colon cancer ([place])   . H/O: rheumatic fever   . Lung disease    Lung nodule  . Pneumonia   . Rheumatoid arthritis [facility])     Past Surgical History:  Procedure Laterality Date  . ABDOMINAL HYSTERECTOMY       Allergies  Allergies  Allergen Reactions  . Bee Venom Anaphylaxis and Hives  . Other Other (See Comments)    Mosquito bites cause hives all over body  . Aspirin Other (See Comments)    Causes stomach problems  . Erythromycin Other (See Comments)    Ate the lining of her stomach  . Penicillins Rash    Has patient had a PCN reaction causing immediate rash, facial/tongue/throat swelling, SOB or lightheadedness with hypotension: Yes Has patient had a PCN reaction causing severe rash involving mucus membranes or skin necrosis: Yes Has patient had a PCN reaction that required hospitalization: No  Has patient had a PCN reaction occurring within the last 10 years: No If all of the above answers are "NO", then may proceed with Cephalosporin use.     Inpatient Medications    . enoxaparin (LOVENOX) injection  40 mg Subcutaneous Q24H  . rosuvastatin  20 mg Oral Daily    Family History    Family History  Problem Relation Age of Onset  . CAD Mother 54    CABG/stents  . Breast cancer Mother   . Asthma Father   . CAD Father 58  . Allergies Father   . CAD Brother 30    Died with MI  . CAD Brother 32  9 stents  . CAD Brother 42    7 stents    Social History    Social History   Social History  . Marital status: Single    Spouse name: N/A  . Number of children: N/A  . Years of education: N/A   Occupational History  . Not on file.   Social History Main Topics  . Smoking status: Never Smoker  . Smokeless tobacco: Never Used  . Alcohol use No  . Drug use: No  . Sexual activity: Not on file   Other Topics Concern  . Not on file   Social History Narrative   Lives alone.       Review of Systems    General:  No chills, fever, night sweats or weight  changes.  Cardiovascular:  No dyspnea on exertion, edema, orthopnea, palpitations, paroxysmal nocturnal dyspnea. Positive for chest pain.  Dermatological: No rash, lesions/masses Respiratory: No cough, dyspnea Urologic: No hematuria, dysuria Abdominal:   No nausea, vomiting, diarrhea, bright red blood per rectum, melena, or hematemesis Neurologic:  No visual changes, wkns, changes in mental status. Positive for facial numbness. All other systems reviewed and are otherwise negative except as noted above.  Physical Exam    Blood pressure (!) 160/75, pulse (!) 51, temperature 98.4 F (36.9 C), temperature source Oral, resp. rate 20, height 4\' 11"  (1.499 m), weight 150 lb (68 kg), SpO2 100 %.  General: Pleasant, Caucasian female appearing in NAD Psych: Normal affect. Neuro: Alert and oriented X 3. Moves all extremities spontaneously. HEENT: Normal  Neck: Supple without bruits or JVD. Lungs:  Resp regular and unlabored, CTA without wheezing or rales. Heart: RRR no s3, s4, or murmurs. Abdomen: Soft, non-tender, non-distended, BS + x 4.  Extremities: No clubbing, cyanosis or edema. DP/PT/Radials 2+ and equal bilaterally.  Labs    Troponin [facility] Test)  Recent Labs  02/17/16 1223  TROPIPOC 0.00    Recent Labs  02/17/16 1520  TROPONINI <0.03   Lab Results  Component Value Date   WBC 5.9 02/17/2016   HGB 13.1 02/17/2016   HCT 40.3 02/17/2016   MCV 84.0 02/17/2016   PLT 184 02/17/2016    Recent Labs Lab 02/17/16 1141 02/17/16 1215  NA  --  140  K  --  3.8  CL  --  106  CO2  --  27  BUN  --  12  CREATININE  --  0.53  CALCIUM  --  9.2  PROT 8.0  --   BILITOT 0.6  --   ALKPHOS 94  --   ALT 28  --   AST 26  --   GLUCOSE  --  88   Lab Results  Component Value Date   CHOL 228 (H) 02/06/2016   HDL 69 02/06/2016   LDLCALC 140 (H) 02/06/2016   TRIG 93 02/06/2016   No results found for: [facility]   Radiology Studies    Dg Chest 2 View  Result Date:  02/17/2016 CLINICAL DATA:  Chest pain for several days EXAM: CHEST  2 VIEW COMPARISON:  12/25/2015 FINDINGS: Cardiac shadow is within normal limits. The known tiny nodules in both lungs are partially visualized in the right lung base. No focal infiltrate or sizable effusion is seen. No bony abnormality is noted. IMPRESSION: No active cardiopulmonary disease. Electronically Signed   By: Inez Catalina M.D.   On: 02/17/2016 13:05    EKG & Cardiac Imaging    EKG: NSR, HR 56, with  no acute ST or T-wave changes.  Echocardiogram: None on file.  Assessment & Plan    1. Atypical Chest Pain - followed by Cardiology for coronary calcifications and significant family history of CAD.  - developed numbness radiating into her jaw bilaterally on 8/12 associated with chest discomfort and numbness into her left arm. Notes her chest discomfort is worse when consuming food, for she develops a burning sensation along her sternum. No association of her CP with exertion. Denies dyspnea with exertion. - initial troponin negative and EKG without acute ischemic changes. Cycle troponin values. If they remain negative, will plan for Treadmill Myoview tomorrow (suppose to have POET as an outpatient next week). Patient is active and wishes to attempt treadmill. If unable to achieve target HR, will need to convert to [facility]. NPO after midnight. Patient's nurse made aware of need for [facility] transport. Of mention, patient's BIRTHDAY is tomorrow. - if NST without ischemia, would recommend evaluation for other etiologies of her pain. Pain is worse with food consumption and reports a history of GERD. Would likely benefit from PPI.   2. HLD - Recent Lipid Panel showed LDL of 140 and she was started on Crestor 20mg  daily. Tolerating this well. Continue current medication regimen.  3. GERD - reports a history of this. Not on any current therapy. - per admitting team  Signed, Erma Heritage, PA-C 02/17/2016, 5:17 PM Pager:  [phone]    Patient seen and examined. Agree with assessment and plan. Kendra Gilbert is a very pleasant 60 year old female who will be turning 32 tomorrow.  She is originally from [place], [place] and had lived in the [place] area for 25 years prior to moving to [place].  She has a history of rheumatoid arthritis, colon CA, GERD, hyperlipidemia, and was recently found to have coronary calcifications on a CT.  She has seen Dr. Mechele Collin in our office and was scheduled to undergo a nuclear stress test in 1 week.  She has a very strong family history for CAD with the brother suffering a massive MI at age 49 and another one undergoing CBG revascularization surgery in his late 65s.  Since Saturday, she has developed progressive jaw discomfort with some left arm pain and also a vague chest sensation.  She admits that her symptoms are worse when she walks, particularly up a hill.  She presented to the emergency room today.  I was contacted by the emergency room physician and in light of her progressive symptomatology with strong family history for premature CAD I recommended that she be admitted to the hospital  for further evaluation. Presently she is pain-free.  HEENT is unremarkable.  Her lungs were clear.  She had definite chest wall tenderness to palpation over the costochondral region.  Rhythm was regular with a faint 1/6 systolic murmur.  There was no S3 or S4 gallop.  Abdomen was mildly distended.  She had positive bowel sounds and was nontender.  Pulses are 2+.  She did not have clubbing cyanosis or edema.  Neurologic exam was grossly nonfocal.  Her ECG independently reviewed by me is unremarkable and shows sinus bradycardia 56 bpm without ST-T abnormalities.  She has been on Crestor for hyperlipidemia, which recently had been started .  Her laboratory N/A 10/07/2015 showed a total cholesterol 228, triglycerides 93, LDL cholesterol 140.  Initial  troponin is negative.  We will schedule the patient  for a nuclear stress test tomorrow and will keep her NPO past midnight tonight.  Troy Sine, MD, [facility] 02/17/2016 6:09 PM

## 2016-02-17 NOTE — ED Triage Notes (Signed)
Pt presents with c/o chest pain that started on Saturday night as well and she now has numbness in the left jaw that radiates to her left shoulder as well. Pt denies any dizziness or shortness of breath.

## 2016-02-17 NOTE — ED Provider Notes (Signed)
[facility] DEPT Provider Note   CSN: [id number] Arrival date & time: 02/17/16  1048     History   Chief Complaint Chief Complaint  Patient presents with  . Chest Pain    HPI Kendra Gilbert is a 60 y.o. female.  HPI   Numbness of jaw both sides, and left arm, felt lightheaded on Saturday, and then continued for last 3 days.  Midchest radiating towards left arm discomfort, Feels like a deep pain, a little bit sharp, comes and goes.  If laying still will calm down, if exerting self will worsen.  Numbness in jaw and arm also worse with exertion.  Mild dyspnea. Some epigastric pain.  Mild nausea. +Diaphioresis, comes and goes since Saturday.  Cough, clear phlegm, chronic saw pulmonology but worsened Saturday too, thought had fever, felt warm. Has had chest pain like this before on and off, has seen Dr. Percival Spanish, scheduled for stress test 8/22. Brother was 7 with MI, mom had heart disease in 9s, other brothers with CAD in 48s  Past Medical History:  Diagnosis Date  . Asthma   . Colon cancer ([place])   . H/O: rheumatic fever   . Lung disease    Lung nodule  . Pneumonia   . Rheumatoid arthritis [facility])     Patient Active Problem List   Diagnosis Date Noted  . Chest pain 02/17/2016  . Dyslipidemia 02/17/2016  . Family history of heart disease   . Dyspnea 01/20/2016  . Multiple pulmonary nodules determined by computed tomography of lung 01/20/2016  . Coronary artery calcification 08/23/2013  . Upper airway cough syndrome 08/19/2013  . Exertional chest pain 08/19/2013    Past Surgical History:  Procedure Laterality Date  . ABDOMINAL HYSTERECTOMY      OB History    No data available       Home Medications    Prior to Admission medications   Medication Sig Start Date End Date Taking? Authorizing Provider  triamcinolone cream (KENALOG) 0.1 % Apply 1 application topically 2 (two) times daily as needed for rash or irritation. Insect bites 02/11/16  Yes Historical  Provider, MD  rosuvastatin (CRESTOR) 20 MG tablet Take 1 tablet (20 mg total) by mouth daily. Patient not taking: Reported on 02/17/2016 02/09/16 05/09/16  Minus Breeding, MD    Family History Family History  Problem Relation Age of Onset  . CAD Mother 63    CABG/stents  . Breast cancer Mother   . Asthma Father   . CAD Father 36  . Allergies Father   . CAD Brother 14    Died with MI  . CAD Brother 23    9 stents  . CAD Brother 10    7 stents    Social History Social History  Substance Use Topics  . Smoking status: Never Smoker  . Smokeless tobacco: Never Used  . Alcohol use No     Allergies   Bee venom; Other; Aspirin; Erythromycin; and Penicillins   Review of Systems Review of Systems  Constitutional: Positive for fatigue. Negative for fever.  HENT: Negative for sore throat.   Eyes: Negative for visual disturbance.  Respiratory: Positive for cough (chronic) and shortness of breath (with exertion, cp).   Cardiovascular: Negative for chest pain.  Gastrointestinal: Positive for nausea (mild). Negative for abdominal pain, constipation, diarrhea and vomiting.  Genitourinary: Negative for difficulty urinating.  Musculoskeletal: Negative for back pain and neck pain.  Skin: Negative for rash.  Neurological: Positive for numbness (/tingling left arm with chest  pain). Negative for syncope, facial asymmetry, weakness and headaches.     Physical Exam Updated Vital Signs BP (!) 160/75 (BP Location: Left Arm)   Pulse (!) 51   Temp 98.4 F (36.9 C) (Oral)   Resp 20   Ht 4\' 11"  (1.499 m)   Wt 150 lb (68 kg)   SpO2 100%   BMI 30.30 kg/m   Physical Exam  Constitutional: She is oriented to person, place, and time. She appears well-developed and well-nourished. No distress.  HENT:  Head: Normocephalic and atraumatic.  Eyes: Conjunctivae and EOM are normal.  Neck: Normal range of motion.  Cardiovascular: Normal rate, regular rhythm, normal heart sounds and intact distal  pulses.  Exam reveals no gallop and no friction rub.   No murmur heard. Pulmonary/Chest: Effort normal and breath sounds normal. No respiratory distress. She has no wheezes. She has no rales. She exhibits tenderness.  Abdominal: Soft. She exhibits no distension. There is no tenderness. There is no guarding.  Musculoskeletal: She exhibits no edema or tenderness.  Neurological: She is alert and oriented to person, place, and time. She has normal strength. No cranial nerve deficit or sensory deficit (reports altered sensation LUE). Coordination normal. GCS eye subscore is 4. GCS verbal subscore is 5. GCS motor subscore is 6.  Skin: Skin is warm and dry. No rash noted. She is not diaphoretic. No erythema.  Nursing note and vitals reviewed.    ED Treatments / Results  Labs (all labs ordered are listed, but only abnormal results are displayed) Labs Reviewed  BASIC METABOLIC PANEL  CBC  HEPATIC FUNCTION PANEL  TROPONIN I  TROPONIN I  TROPONIN I  I-STAT TROPOININ, ED    EKG  EKG Interpretation  Date/Time:  Tuesday February 17 2016 12:02:47 EDT Ventricular Rate:  56 PR Interval:    QRS Duration: 94 QT Interval:  443 QTC Calculation: 428 R Axis:   37 Text Interpretation:  Sinus rhythm Confirmed by Gerald Leitz ([id number]), editor Lorenda Cahill CT, Leda Gauze [phone]) on 02/17/2016 12:12:24 PM       Radiology Dg Chest 2 View  Result Date: 02/17/2016 CLINICAL DATA:  Chest pain for several days EXAM: CHEST  2 VIEW COMPARISON:  12/25/2015 FINDINGS: Cardiac shadow is within normal limits. The known tiny nodules in both lungs are partially visualized in the right lung base. No focal infiltrate or sizable effusion is seen. No bony abnormality is noted. IMPRESSION: No active cardiopulmonary disease. Electronically Signed   By: Inez Catalina M.D.   On: 02/17/2016 13:05    Procedures Procedures (including critical care time)  Medications Ordered in ED Medications  nitroGLYCERIN (NITROSTAT) SL tablet  0.4 mg (0.4 mg Sublingual Given 02/17/16 1422)  rosuvastatin (CRESTOR) tablet 20 mg (20 mg Oral Given 02/17/16 1823)  acetaminophen (TYLENOL) tablet 650 mg (650 mg Oral Given 02/17/16 2057)  ondansetron (ZOFRAN) injection 4 mg (not administered)  enoxaparin (LOVENOX) injection 40 mg (40 mg Subcutaneous Given 02/17/16 1823)  gi cocktail (Maalox,Lidocaine,Donnatal) (not administered)  albuterol (PROVENTIL) (2.5 MG/3ML) 0.083% nebulizer solution 2.5 mg (not administered)     Initial Impression / Assessment and Plan / ED Course  I have reviewed the triage vital signs and the nursing notes.  Pertinent labs & imaging results that were available during my care of the patient were reviewed by me and considered in my medical decision making (see chart for details).  Clinical Course   60yo female with history of asthma, lung nodules, RA, chronic cough seen by pulmonology with suspected  upper airway cough syndrome, pt reports hx of colon cancer (however unclear--reports malignant polyp, no hx of surgery) coronary calcifications seen on CT seen by Dr. Percival Spanish of Cardiology, strong family hx of CAD, presents with concern for chest pain with radiation of numbness to jaw and left arm.  Given otherwise normal neurologic exam, waxing/waning jaw/arm symptoms in association with chest pain, have low suspicion for TIA/CVA.  Given duration of symptoms, equal bilat pulses, no mediastinal widening on XR, doubt acute dissection. Dyspnea is with exertion, no asymmetric leg swelling, no hx of DVT, no actively treated cancer (and unclear if diagnosis based on hx/record review) and low suspicion for PE.  Given exertional symptoms, hx of coronary calcifications, family hx, largest concern is for anginal symptoms.  Pt without acute ECG changes, negative troponin. Reports severe abdominal pain with aspirin and this was not given.  Feels that nitro helped CP, arm and jaw pain.  Given family hx, concern for CAD, will admit for further  evaluation with troponins, expedited stress test, Cardiology consult with hospitalist admission.    Final Clinical Impressions(s) / ED Diagnoses   Final diagnoses:  Chest pain    New Prescriptions Current Discharge Medication List       Gareth Morgan, MD 02/17/16 2103

## 2016-02-17 NOTE — H&P (Signed)
TRH H&P   Patient Demographics:    Anneisha Munzer, is a 60 y.o. female  MRN: [id number]   DOB - 1955/11/06  Admit Date - 02/17/2016  Outpatient Primary MD for the patient is Gerrit Heck, MD      Patient coming from: Home  Chief Complaint  Patient presents with  . Chest Pain      HPI:    Carlyne Hrabe  is a 60 y.o. female, With history of colon cancer, asthma, rheumatoid arthritis, dyslipidemia, obesity comes in with. History of substernal chest pain at times radiating to her jaw and left shoulder, worse with exertion better with rest. Patient denies any cough fever or chills, cough, no shortness of breath at rest but some on exertion with chest pain, no fever chills, no abdominal pain, does have heartburn for which she follows with GI, denies any blood in stool or urine, no dysuria, no focal weakness. Denies any personal history of blood clots, no recent travel or exposure to sick contacts. In the ER workup was essentially unremarkable including normal chest x-ray, EKG and blood work including troponin. Cardiology consulted who requested internal medicine to admit.    Review of systems:    In addition to the HPI above,   No Fever-chills, No Headache, No changes with Vision or hearing, No problems swallowing food or Liquids, Chest symptoms as above, Cough or Shortness of Breath, No Abdominal pain, No Nausea or Vommitting, Bowel movements are regular, No Blood in stool or Urine, No dysuria, No new skin rashes or bruises, No new joints pains-aches,  No new weakness, tingling, numbness in any extremity, No recent weight gain or loss, No polyuria, polydypsia or polyphagia, No significant  Mental Stressors.  A full 10 point Review of Systems was done, except as stated above, all other Review of Systems were negative.   With Past History of the following :    Past Medical History:  Diagnosis Date  . Asthma   . Colon cancer ([place])   . H/O: rheumatic fever   . Lung disease    Lung nodule  . Pneumonia   . Rheumatoid arthritis [facility])       Past Surgical History:  Procedure Laterality Date  . ABDOMINAL HYSTERECTOMY        Social History:     Social History  Substance  Use Topics  . Smoking status: Never Smoker  . Smokeless tobacco: Never Used  . Alcohol use No         Family History :     Family History  Problem Relation Age of Onset  . CAD Mother 70    CABG/stents  . Breast cancer Mother   . Asthma Father   . CAD Father 33  . Allergies Father   . CAD Brother 51    Died with MI  . CAD Brother 80    9 stents  . CAD Brother 5    7 stents       Home Medications:   Prior to Admission medications   Medication Sig Start Date End Date Taking? Authorizing Provider  triamcinolone cream (KENALOG) 0.1 % Apply 1 application topically 2 (two) times daily as needed for rash or irritation. Insect bites 02/11/16  Yes Historical Provider, MD  rosuvastatin (CRESTOR) 20 MG tablet Take 1 tablet (20 mg total) by mouth daily. Patient not taking: Reported on 02/17/2016 02/09/16 05/09/16  Minus Breeding, MD     Allergies:     Allergies  Allergen Reactions  . Bee Venom Anaphylaxis and Hives  . Other Other (See Comments)    Mosquito bites cause hives all over body  . Aspirin Other (See Comments)    Causes stomach problems  . Erythromycin Other (See Comments)    Ate the lining of her stomach  . Penicillins Rash    Has patient had a PCN reaction causing immediate rash, facial/tongue/throat swelling, SOB or lightheadedness with hypotension: Yes Has patient had a PCN reaction causing severe rash involving mucus membranes or skin necrosis: Yes Has patient had a PCN  reaction that required hospitalization: No  Has patient had a PCN reaction occurring within the last 10 years: No If all of the above answers are "NO", then may proceed with Cephalosporin use.      Physical Exam:   Vitals  Blood pressure 157/72, pulse (!) 58, temperature 97.8 F (36.6 C), temperature source Oral, resp. rate 21, height 4\' 11"  (1.499 m), weight 63.5 kg (140 lb), SpO2 96 %.   1. General Anxious middle-aged obese white female lying in bed in NAD,    2. Anxious affect and insight, Not Suicidal or Homicidal, Awake Alert, Oriented X 3.  3. No F.N deficits, ALL C.Nerves Intact, Strength 5/5 all 4 extremities, Sensation intact all 4 extremities, Plantars down going.  4. Ears and Eyes appear Normal, Conjunctivae clear, PERRLA. Moist Oral Mucosa.  5. Supple Neck, No JVD, No cervical lymphadenopathy appriciated, No Carotid Bruits.  6. Symmetrical Chest wall movement, Good air movement bilaterally, CTAB.  7. RRR, No Gallops, Rubs or Murmurs, No Parasternal Heave.  8. Positive Bowel Sounds, Abdomen Soft, No tenderness, No organomegaly appriciated,No rebound -guarding or rigidity.  9.  No Cyanosis, Normal Skin Turgor, No Skin Rash or Bruise.  10. Good muscle tone,  joints appear normal , no effusions, Normal ROM.  11. No Palpable Lymph Nodes in Neck or Axillae      Data Review:    CBC  Recent Labs Lab 02/17/16 1215  WBC 5.9  HGB 13.1  HCT 40.3  PLT 184  MCV 84.0  MCH 27.3  MCHC 32.5  RDW 13.2   ------------------------------------------------------------------------------------------------------------------  Chemistries   Recent Labs Lab 02/17/16 1141 02/17/16 1215  NA  --  140  K  --  3.8  CL  --  106  CO2  --  27  GLUCOSE  --  88  BUN  --  12  CREATININE  --  0.53  CALCIUM  --  9.2  AST 26  --   ALT 28  --   ALKPHOS 94  --   BILITOT 0.6  --     ------------------------------------------------------------------------------------------------------------------ estimated creatinine clearance is 61.3 mL/min (by C-G formula based on SCr of 0.8 mg/dL). ------------------------------------------------------------------------------------------------------------------ No results for input(s): TSH, T4TOTAL, T3FREE, THYROIDAB in the last 72 hours.  Invalid input(s): FREET3  Coagulation profile No results for input(s): INR, PROTIME in the last 168 hours. ------------------------------------------------------------------------------------------------------------------- No results for input(s): DDIMER in the last 72 hours. -------------------------------------------------------------------------------------------------------------------  Cardiac Enzymes No results for input(s): CKMB, TROPONINI, MYOGLOBIN in the last 168 hours.  Invalid input(s): CK ------------------------------------------------------------------------------------------------------------------ No results found for: BNP   ---------------------------------------------------------------------------------------------------------------  Urinalysis    Component Value Date/Time   COLORURINE YELLOW 12/06/2015 [place] 12/06/2015 0714   LABSPEC 1.022 12/06/2015 0714   PHURINE 6.5 12/06/2015 0714   GLUCOSEU NEGATIVE 12/06/2015 0714   HGBUR NEGATIVE 12/06/2015 0714   BILIRUBINUR NEGATIVE 12/06/2015 0714   KETONESUR NEGATIVE 12/06/2015 0714   PROTEINUR NEGATIVE 12/06/2015 0714   NITRITE NEGATIVE 12/06/2015 0714   LEUKOCYTESUR NEGATIVE 12/06/2015 0714    ----------------------------------------------------------------------------------------------------------------   Imaging Results:    Dg Chest 2 View  Result Date: 02/17/2016 CLINICAL DATA:  Chest pain for several days EXAM: CHEST  2 VIEW COMPARISON:  12/25/2015 FINDINGS: Cardiac shadow is within  normal limits. The known tiny nodules in both lungs are partially visualized in the right lung base. No focal infiltrate or sizable effusion is seen. No bony abnormality is noted. IMPRESSION: No active cardiopulmonary disease. Electronically Signed   By: Inez Catalina M.D.   On: 02/17/2016 13:05    My personal review of EKG: Rhythm NSR,  no Acute ST changes   Assessment & Plan:     1. Chest pain. Keep and 23 are observation, cycle troponin, telemetry monitor, she is allergic to aspirin, a signed EKG and troponin are unremarkable, cardiology consulted we'll keep her by mouth after midnight for possible stress test.  2. Dyslipidemia. Continue home dose statin.  3. History of GERD, asthma and bronchitis. Stable no acute issues. Outpatient follow-up with her primary gastroenterologist and pulmonary physician who she is following with.    DVT Prophylaxis  Lovenox   AM Labs Ordered, also please review Full Orders  Family Communication: Admission, patients condition and plan of care including tests being ordered have been discussed with the patient who indicates understanding and agree with the plan and Code Status.  Code Status Full  Likely DC to  Home 1-2 days  Condition Fair  Consults called: Cards   Admission status: Obs   Time spent in minutes : 35   Lala Lund K M.D on 02/17/2016 at 3:15 PM  Between 7am to 7pm - Pager - [phone]. After 7pm go to [email or web address] - password [email or web address]  [facility] - Office  [phone]

## 2016-02-18 ENCOUNTER — Ambulatory Visit (HOSPITAL_COMMUNITY)
Admission: RE | Admit: 2016-02-18 | Discharge: 2016-02-18 | Disposition: A | Discharge status: Home / Self Care | Payer: BLUE CROSS/BLUE SHIELD | Source: Ambulatory Visit | Attending: Student | Admitting: Student

## 2016-02-18 ENCOUNTER — Encounter (HOSPITAL_COMMUNITY): Payer: Self-pay | Admitting: Student

## 2016-02-18 ENCOUNTER — Ambulatory Visit (HOSPITAL_COMMUNITY)
Admit: 2016-02-18 | Discharge: 2016-02-18 | Disposition: A | Discharge status: Home / Self Care | Payer: BLUE CROSS/BLUE SHIELD | Attending: Student | Admitting: Student

## 2016-02-18 DIAGNOSIS — R079 Chest pain, unspecified: Secondary | ICD-10-CM

## 2016-02-18 DIAGNOSIS — E785 Hyperlipidemia, unspecified: Secondary | ICD-10-CM | POA: Diagnosis not present

## 2016-02-18 DIAGNOSIS — R072 Precordial pain: Secondary | ICD-10-CM | POA: Diagnosis not present

## 2016-02-18 DIAGNOSIS — Z8249 Family history of ischemic heart disease and other diseases of the circulatory system: Secondary | ICD-10-CM | POA: Diagnosis not present

## 2016-02-18 DIAGNOSIS — I2510 Atherosclerotic heart disease of native coronary artery without angina pectoris: Secondary | ICD-10-CM | POA: Diagnosis not present

## 2016-02-18 LAB — NM MYOCAR MULTI W/SPECT W/WALL MOTION / EF
CHL CUP MPHR: 160 {beats}/min
CHL CUP NUCLEAR SDS: 2
CHL CUP NUCLEAR SRS: 0
CHL CUP NUCLEAR SSS: 2
CHL CUP RESTING HR STRESS: 51 {beats}/min
CSEPED: 6 min
CSEPEW: 7 METS
CSEPPHR: 162 {beats}/min
Exercise duration (sec): 0 s
LHR: 0.04
LV dias vol: 52 mL (ref 46–106)
LV sys vol: 20 mL
NUC STRESS TID: 1.61
Percent HR: 101 %

## 2016-02-18 LAB — TROPONIN I

## 2016-02-18 MED ORDER — AMLODIPINE BESYLATE 5 MG PO TABS
5.0000 mg | ORAL_TABLET | Freq: Every day | ORAL | Status: DC
  Administered 2016-02-18: 5 mg via ORAL
  Filled 2016-02-18: qty 1

## 2016-02-18 MED ORDER — TECHNETIUM TC 99M TETROFOSMIN IV KIT
30.0000 | PACK | Freq: Once | INTRAVENOUS | Status: AC | PRN
  Administered 2016-02-18: 30 via INTRAVENOUS

## 2016-02-18 MED ORDER — TECHNETIUM TC 99M TETROFOSMIN IV KIT
10.0000 | PACK | Freq: Once | INTRAVENOUS | Status: AC | PRN
  Administered 2016-02-18: 10 via INTRAVENOUS

## 2016-02-18 MED ORDER — AMLODIPINE BESYLATE 5 MG PO TABS: 5.0000 mg | ORAL_TABLET | Freq: Every day | ORAL | 1 refills | Status: DC

## 2016-02-18 MED ORDER — PANTOPRAZOLE SODIUM 40 MG PO TBEC: 40.0000 mg | DELAYED_RELEASE_TABLET | Freq: Every day | ORAL | Status: DC

## 2016-02-18 MED ORDER — PANTOPRAZOLE SODIUM 40 MG PO TBEC: 40.0000 mg | DELAYED_RELEASE_TABLET | Freq: Every day | ORAL | 0 refills | Status: DC

## 2016-02-18 NOTE — Progress Notes (Signed)
Hospital Problem List     Principal Problem:   Chest pain Active Problems:   Coronary artery calcification   Dyslipidemia   Family history of heart disease    Patient Profile:   Primary Cardiologist: Dr. Percival Spanish  60 y.o. female w/ PMH of RA, colon cancer, HLD, GERD and followed by Cardiology for coronary calcifications who presented to [facility] ED on 02/17/2016 for evaluation of chest pain.   Subjective   No chest pain or sob.  Trop neg.  For MV today.  Inpatient Medications    . enoxaparin (LOVENOX) injection  40 mg Subcutaneous Q24H  . rosuvastatin  20 mg Oral Daily    Vital Signs    Vitals:   02/17/16 1429 02/17/16 1644 02/17/16 2207 02/18/16 0508  BP: 157/72 (!) 160/75 (!) 124/53 138/73  Pulse: (!) 58 (!) 51 (!) 56 60  Resp: 21 20 20 20   Temp:  98.4 F (36.9 C) 97.9 F (36.6 C) 98 F (36.7 C)  TempSrc:  Oral Oral Oral  SpO2: 96% 100% 99% 100%  Weight:  150 lb (68 kg)    Height:  4\' 11"  (1.499 m)      Intake/Output Summary (Last 24 hours) at 02/18/16 1036 Last data filed at 02/17/16 2300  Gross per 24 hour  Intake              120 ml  Output              300 ml  Net             -180 ml   Filed Weights   02/17/16 1055 02/17/16 1644  Weight: 140 lb (63.5 kg) 150 lb (68 kg)    Physical Exam    General: Well developed, well nourished, female appearing in no acute distress. Head: Normocephalic, atraumatic.  Neck: Supple without bruits, JVD not elevated. Lungs:  Resp regular and unlabored, CTA without wheezing or rales. Heart: RRR, S1, S2, no S3, S4, or murmur; no rub. Abdomen: Soft, non-tender, non-distended with normoactive bowel sounds. No hepatomegaly. No rebound/guarding. No obvious abdominal masses. Extremities: No clubbing, cyanosis, or edema. Distal pedal pulses are 2+ bilaterally. Neuro: Alert and oriented X 3. Moves all extremities spontaneously. Psych: Normal affect.  Labs    CBC  Recent Labs  02/17/16 1215  WBC 5.9  HGB 13.1  HCT  40.3  MCV 84.0  PLT [id number]   Basic Metabolic Panel  Recent Labs  02/17/16 1215  NA 140  K 3.8  CL 106  CO2 27  GLUCOSE 88  BUN 12  CREATININE 0.53  CALCIUM 9.2   Liver Function Tests  Recent Labs  02/17/16 1141  AST 26  ALT 28  ALKPHOS 94  BILITOT 0.6  PROT 8.0  ALBUMIN 3.9   Cardiac Enzymes  Recent Labs  02/17/16 1520 02/17/16 2120 02/18/16 0303  TROPONINI <0.03 <0.03 <0.03     Telemetry    NSR, HR in mid-50's - 60's.   ECG    No new tracings.    Cardiac Studies and Radiology    Dg Chest 2 View  Result Date: 02/17/2016 CLINICAL DATA:  Chest pain for several days EXAM: CHEST  2 VIEW COMPARISON:  12/25/2015 FINDINGS: Cardiac shadow is within normal limits. The known tiny nodules in both lungs are partially visualized in the right lung base. No focal infiltrate or sizable effusion is seen. No bony abnormality is noted. IMPRESSION: No active cardiopulmonary disease. Electronically Signed   By: Elta Guadeloupe  Lukens M.D.   On: 02/17/2016 13:05   Assessment & Plan    1. Atypical Chest Pain - followed by Cardiology for coronary calcifications and significant family history of CAD.  - developed numbness radiating into her jaw bilaterally on 8/12 associated with chest discomfort and numbness into her left arm. Notes her chest discomfort is worse when consuming food, for she develops a burning sensation along her sternum. No association of her CP with exertion. Denies dyspnea with exertion. - No c/p overnight. - cyclic troponin values negative and EKG without acute ischemic changes. For Treadmill Myoview this AM. Patient aware if target HR cannot be achieved, this will need to be converted to a Lexiscan.  - if NST without ischemia, would recommend evaluation for other etiologies of her pain. Pain is worse with food consumption and reports a history of GERD. Would likely benefit from PPI.   2. HLD - Recent Lipid Panel showed LDL of 140 and she was started on Crestor 20mg   daily. Tolerating this well. Continue current medication regimen.  3. GERD - reports a history of this. Not on any current therapy. - per admitting team  Signed, Murray Hodgkins , NP 10:36 AM 02/18/2016 Pager: [phone]  Patient seen and examined. Agree with assessment and plan. No recurrent chest pain. No ecg changes on GXT, but she had a hypertensive response with BP> 200 during stress. Await formal report of perfusion data, but by my prolimary review images appear normal. Would initiate amlodipine for BP and can help with CAD and potential esophageal spasm and consider PPI for  GERD sx;  Cardiology f/u with Dr. Percival Spanish.   Troy Sine, MD, [facility] 02/18/2016 3:07 PM

## 2016-02-18 NOTE — Discharge Summary (Signed)
Physician Discharge Summary  Kendra Gilbert [id number] DOB: 25-Jul-1955 DOA: 02/17/2016  PCP: Gerrit Heck, MD  Admit date: 02/17/2016 Discharge date: 02/18/2016  Recommendations for Outpatient Follow-up:  Pt will need to follow up with PCP in 2-3 weeks post discharge  Discharge Diagnoses:  Principal Problem:   Chest pain Active Problems:   Dyslipidemia   Family history of heart disease  Discharge Condition: Stable  Diet recommendation: Heart healthy diet discussed in details   History of present illness:  60 y.o. female, With history of colon cancer, asthma, rheumatoid arthritis, dyslipidemia, obesity comes in with. History of substernal chest pain at times radiating to her jaw and left shoulder, worse with exertion better with rest. Patient denies any cough fever or chills, cough, no shortness of breath at rest but some on exertion with chest pain, no fever chills, no abdominal pain, does have heartburn for which she follows with GI, denies any blood in stool or urine, no dysuria, no focal weakness. Denies any personal history of blood clots, no recent travel or exposure to sick contacts. In the ER workup was essentially unremarkable including normal chest x-ray, EKG and blood work including troponin. Cardiology consulted who requested internal medicine to admit.  Hospital Course:  Principal Problem:   Chest pain - ACS ruled out, cardiology suspected GI source and recommended trial of PPI - Myoview study results below   Blood pressure demonstrated a hypertensive response to exercise.  There was no ST segment deviation noted during stress.  The study is normal.  This is a low risk study.  The left ventricular ejection fraction is hyperdynamic (>65%).   Active Problems:   Accelerated HTN - started on Norvasc and pt made aware to follow up with her PCP   Dyslipidemia - continue statin   Procedures/Studies: Dg Chest 2 View  Result Date:  02/17/2016 CLINICAL DATA:  Chest pain for several days EXAM: CHEST  2 VIEW COMPARISON:  12/25/2015 FINDINGS: Cardiac shadow is within normal limits. The known tiny nodules in both lungs are partially visualized in the right lung base. No focal infiltrate or sizable effusion is seen. No bony abnormality is noted. IMPRESSION: No active cardiopulmonary disease. Electronically Signed   By: Inez Catalina M.D.   On: 02/17/2016 13:05     Discharge Exam: Vitals:   02/17/16 2207 02/18/16 0508  BP: (!) 124/53 138/73  Pulse: (!) 56 60  Resp: 20 20  Temp: 97.9 F (36.6 C) 98 F (36.7 C)   Vitals:   02/17/16 1429 02/17/16 1644 02/17/16 2207 02/18/16 0508  BP: 157/72 (!) 160/75 (!) 124/53 138/73  Pulse: (!) 58 (!) 51 (!) 56 60  Resp: 21 20 20 20   Temp:  98.4 F (36.9 C) 97.9 F (36.6 C) 98 F (36.7 C)  TempSrc:  Oral Oral Oral  SpO2: 96% 100% 99% 100%  Weight:  68 kg (150 lb)    Height:  4\' 11"  (1.499 m)      General: Pt is alert, follows commands appropriately, not in acute distress Cardiovascular: Regular rate and rhythm, S1/S2 +, no murmurs, no rubs, no gallops Respiratory: Clear to auscultation bilaterally, no wheezing, no crackles, no rhonchi Abdominal: Soft, non tender, non distended, bowel sounds +, no guarding Extremities: no edema, no cyanosis, pulses palpable bilaterally DP and PT Neuro: Grossly nonfocal  Discharge Instructions     Medication List    TAKE these medications   amLODipine 5 MG tablet Commonly known as:  NORVASC Take 1 tablet (5 mg  total) by mouth daily.   rosuvastatin 20 MG tablet Commonly known as:  CRESTOR Take 1 tablet (20 mg total) by mouth daily.   triamcinolone cream 0.1 % Commonly known as:  KENALOG Apply 1 application topically 2 (two) times daily as needed for rash or irritation. Insect bites       Follow-up Information    Gerrit Heck, MD .   Specialty:  Family Medicine Contact information: [address]  [place] [id number] [phone]            The results of significant diagnostics from this hospitalization (including imaging, microbiology, ancillary and laboratory) are listed below for reference.     Microbiology: No results found for this or any previous visit (from the past 240 hour(s)).   Labs: Basic Metabolic Panel:  Recent Labs Lab 02/17/16 1215  NA 140  K 3.8  CL 106  CO2 27  GLUCOSE 88  BUN 12  CREATININE 0.53  CALCIUM 9.2   Liver Function Tests:  Recent Labs Lab 02/17/16 1141  AST 26  ALT 28  ALKPHOS 94  BILITOT 0.6  PROT 8.0  ALBUMIN 3.9  CBC:  Recent Labs Lab 02/17/16 1215  WBC 5.9  HGB 13.1  HCT 40.3  MCV 84.0  PLT 184   Cardiac Enzymes:  Recent Labs Lab 02/17/16 1520 02/17/16 2120 02/18/16 0303  TROPONINI <0.03 <0.03 <0.03    SIGNED: Time coordinating discharge: 30 minutes  Faye Ramsay, MD  [facility] 02/18/2016, 1:04 PM Pager [phone]  If 7PM-7AM, please contact night-coverage [email or web address] Password [email or web address]

## 2016-02-19 ENCOUNTER — Telehealth: Payer: Self-pay | Admitting: Cardiology

## 2016-02-19 ENCOUNTER — Ambulatory Visit
Admission: RE | Admit: 2016-02-19 | Discharge: 2016-02-19 | Disposition: A | Discharge status: Home / Self Care | Payer: BLUE CROSS/BLUE SHIELD | Source: Ambulatory Visit | Attending: Family Medicine | Admitting: Family Medicine

## 2016-02-19 ENCOUNTER — Institutional Professional Consult (permissible substitution): Payer: BLUE CROSS/BLUE SHIELD | Admitting: Internal Medicine

## 2016-02-19 DIAGNOSIS — Z1231 Encounter for screening mammogram for malignant neoplasm of breast: Secondary | ICD-10-CM

## 2016-02-19 NOTE — Telephone Encounter (Signed)
Kendra Gilbert is calling because she has a question about the medication

## 2016-02-19 NOTE — Telephone Encounter (Signed)
New Message  Pt c/o medication issue:  1. Name of Medication: Norvasc, Crestor, Protonix  2. How are you currently taking this medication (dosage and times per day)? Norvasc 5mg , protonix 40mg , crestor 20 mg. Only meds pt has taken is Crestor while in hospital twice yesterday.  3. Are you having a reaction (difficulty breathing--STAT)? No  4. What is your medication issue? Pt is concerned about medication specifically Norvasc  Pt voiced if liver needs to be checked prior to taking medication

## 2016-02-19 NOTE — Telephone Encounter (Signed)
Returned call to patient she was calling to find out when she needs to have liver panel.Advised she just had liver panel done at hospital 02/17/16 which was normal.Stated she is scheduled to have fasting lab again in Oct.

## 2016-02-19 NOTE — Telephone Encounter (Signed)
Returned call to patient no answer.LMTC. 

## 2016-02-24 ENCOUNTER — Ambulatory Visit: Payer: BLUE CROSS/BLUE SHIELD | Admitting: Cardiology

## 2016-02-25 ENCOUNTER — Ambulatory Visit: Payer: BLUE CROSS/BLUE SHIELD

## 2016-03-02 ENCOUNTER — Other Ambulatory Visit (INDEPENDENT_AMBULATORY_CARE_PROVIDER_SITE_OTHER): Payer: BLUE CROSS/BLUE SHIELD

## 2016-03-02 ENCOUNTER — Ambulatory Visit (INDEPENDENT_AMBULATORY_CARE_PROVIDER_SITE_OTHER): Payer: BLUE CROSS/BLUE SHIELD | Admitting: Internal Medicine

## 2016-03-02 ENCOUNTER — Encounter: Payer: Self-pay | Admitting: Internal Medicine

## 2016-03-02 VITALS — BP 118/70 | HR 70 | Ht 59.0 in | Wt 153.6 lb

## 2016-03-02 DIAGNOSIS — R05 Cough: Secondary | ICD-10-CM | POA: Diagnosis not present

## 2016-03-02 DIAGNOSIS — R058 Other specified cough: Secondary | ICD-10-CM

## 2016-03-02 DIAGNOSIS — R0600 Dyspnea, unspecified: Secondary | ICD-10-CM

## 2016-03-02 LAB — CBC WITH DIFFERENTIAL/PLATELET
BASOS ABS: 0 10*3/uL (ref 0.0–0.1)
BASOS PCT: 0.4 % (ref 0.0–3.0)
EOS ABS: 0 10*3/uL (ref 0.0–0.7)
EOS PCT: 0.7 % (ref 0.0–5.0)
HEMATOCRIT: 40 % (ref 36.0–46.0)
HEMOGLOBIN: 13.6 g/dL (ref 12.0–15.0)
LYMPHS PCT: 23.7 % (ref 12.0–46.0)
Lymphs Abs: 1.6 10*3/uL (ref 0.7–4.0)
MCHC: 34 g/dL (ref 30.0–36.0)
MCV: 81.8 fl (ref 78.0–100.0)
MONOS PCT: 6.7 % (ref 3.0–12.0)
Monocytes Absolute: 0.5 10*3/uL (ref 0.1–1.0)
Neutro Abs: 4.7 10*3/uL (ref 1.4–7.7)
Neutrophils Relative %: 68.5 % (ref 43.0–77.0)
Platelets: 193 10*3/uL (ref 150.0–400.0)
RBC: 4.89 Mil/uL (ref 3.87–5.11)
RDW: 13.9 % (ref 11.5–15.5)
WBC: 6.9 10*3/uL (ref 4.0–10.5)

## 2016-03-02 NOTE — Progress Notes (Signed)
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 Pulmonary Consult    Referred by Dr. Leighton Ruff for eval of pulmonary nodule. She c/o right side back pain since June 2017. She states that the pain occurs with exertion and also when she coughs. She was seen here for cough in 2015 and states her cough is unchanged since then.   walks daily does ok ok flat for up to an hour good pace / hills are a problem/ carrying objects Cough is worse p supper and when lies down and pretty much every night/not so much  wakes up each am maybe a tsp of clear mucus  Back pain is localized just to R of midline really dates back to 2011/ aggravated by coughing and worse since cough has been worse.  Pred/ rx for asthma/ rhinitis never convincingly helped cough or sob  rec Pepcid ac 20 mg at bedtime  For drainage / throat tickle try take CHLORPHENIRAMINE  4 mg - take one every 4 hours as needed -  Did not try  GERD diet     03/02/2016  f/u ov/Kendra Gilbert re:  Chief Complaint  Patient presents with  . Follow-up    Cough is better on some days. She c/o CP that comes and goes.    cardiac w/u neg by Hochrein,  continue to have same pattern cough as above, no sign sob   No obvious day to day or daytime variability or assoc excess/ purulent sputum or mucus plugs   Or  chest tightness, subjective wheeze or overt sinus or hb symptoms. No unusual exp hx or h/o childhood pna/ asthma or knowledge of premature birth.  Also denies any obvious fluctuation of symptoms with weather or environmental changes or other aggravating or alleviating factors except as outlined above   Current Medications, Allergies, Complete Past Medical History, Past Surgical History, Family History, and Social History were reviewed in [employer] record.  ROS  The following are not active complaints unless bolded sore throat, dysphagia, dental problems, itching, sneezing,  nasal congestion or excess/ purulent secretions, ear ache,   fever, chills, sweats, unintended wt loss, classically pleuritic or exertional cp, hemoptysis,  orthopnea pnd or leg swelling, presyncope, palpitations, abdominal pain, anorexia, nausea, vomiting, diarrhea  or change in bowel or bladder habits, change in stools or urine, dysuria,hematuria,  rash, arthralgias, visual complaints, headache, numbness, weakness or ataxia or problems with walking or coordination,  change in mood/affect or memory.                  Objective:   Physical Exam  amb wf nad chewing  Juicy fruit/ unusual affect    03/02/2016       154   01/20/16 149 lb (67.586 kg)  08/23/13 143 lb (64.864 kg)  08/17/13 145 lb (65.772 kg)    Vital signs reviewed   HEENT: nl dentition, turbinates, and oropharynx. Nl external ear canals without cough reflex   NECK :  without JVD/Nodes/TM/ nl carotid upstrokes bilaterally   LUNGS: no acc muscle use,  Nl contour chest which is clear to A and P bilaterally  - no cough on inspiration now  CV:  RRR  no s3 or murmur or increase in P2, no edema   ABD:  soft and nontender with nl inspiratory excursion in the supine  position. No bruits or organomegaly, bowel sounds nl  MS:  Nl gait/ ext warm without deformities, calf tenderness, cyanosis or clubbing No obvious joint restrictions   SKIN: warm and dry without lesions    NEURO:  alert, approp, nl sensorium with  no motor deficits      I personally reviewed images and agree with radiology impression as follows:  CT Chest   12/25/15 1. Small nodules are again identified. The largest has a mean diameter of 3 mm. No follow-up needed if patient is low-risk (and has no known or suspected primary neoplasm). Non-contrast chest CT can be considered in 12 months if patient is high-risk.   I personally reviewed images and agree with radiology impression as follows:  CXR:   02/17/16  No active cardiopulmonary disease.  Labs 03/02/2016 = cbc with diff/ allergy profile   Assessment:

## 2016-03-02 NOTE — Patient Instructions (Addendum)
Pantoprazole (protonix) 40 mg  Take  30-60 min before first meal of the day and Pepcid (famotidine)  20 mg at supper  until return to office - this is the best way to tell whether stomach acid is contributing to your problem.    For drainage / throat tickle try take CHLORPHENIRAMINE  4 mg - take one every 4 hours as needed - available over the counter- may cause drowsiness so start with just a bedtime dose or two (take one hour before bed, first one and if still cough at bedtime then try 2)  and see how you tolerate it before trying in daytime    Please see patient coordinator before you leave today  to schedule sinus ct   Please remember to go to the lab department downstairs for your tests - we will call you with the results when they are available.  Please schedule a follow up office visit in 4 weeks, sooner if needed with all active meds in hand

## 2016-03-02 NOTE — Assessment & Plan Note (Signed)
rec hs h2 and h1 08/17/13 > and 01/20/16 > did not take h1 as directed, did not take ppi ac as rec  I had an extended discussion with the patient reviewing all relevant studies completed to date and  lasting 15 to 20 minutes of a 25 minute visit on the following ongoing concerns:  The standardized cough guidelines published in Chest by Lissa Morales in 2006 are still the best available and consist of a multiple step process (up to 12!) , not a single office visit,  and are intended  to address this problem logically,  with an alogrithm dependent on response to empiric treatment at  each progressive step  to determine a specific diagnosis with  minimal addtional testing needed. Therefore if adherence is an issue or can't be accurately verified,  it's very unlikely the standard evaluation and treatment will be successful here.    Furthermore, response to therapy (other than acute cough suppression, which should only be used short term with avoidance of narcotic containing cough syrups if possible), can be a gradual process for which the patient is not likely to  perceive immediate benefit.  Unlike going to an eye doctor where the best perscription is almost always the first one and is immediately effective, this is almost never the case in the management of chronic cough syndromes. Therefore the patient needs to commit up front to consistently adhere to recommendations  for up to 6 weeks of therapy directed at the likely underlying problem(s) before the response can be reasonably evaluated.   Next step is allergy testing/ sinus ct and aggressive rx for GERD and pnds then regroup using a trust but verify approach  I had an extended discussion with the patient reviewing all relevant studies completed to date and  lasting 15 to 20 minutes of a 25 minute visit    Each maintenance medication was reviewed in detail including most importantly the difference between maintenance and prns and under what  circumstances the prns are to be triggered using an action plan format that is not reflected in the computer generated alphabetically organized AVS.    Please see instructions for details which were reviewed in writing and the patient given a copy highlighting the part that I personally wrote and discussed at today's ov.

## 2016-03-02 NOTE — Assessment & Plan Note (Signed)
03/02/2016  Walked RA x 3 laps @ 185 ft each stopped due to  End of study,fast pace, no sob or desat   - Spirometry 03/02/2016  Suboptimal effort on the early portion of f/v but still had nl flows  Continues to have Symptoms are markedly disproportionate to objective findings and not clear this is a lung problem but pt does appear to have difficult airway management issues.   For now focus on the upper airway then consider cpst if not improving

## 2016-03-03 LAB — RESPIRATORY ALLERGY PROFILE REGION II ~~LOC~~
ALLERGEN, D PTERNOYSSINUS, D1: 1.13 kU/L — AB
Allergen, Cedar tree, t12: 1.62 kU/L — ABNORMAL HIGH
Allergen, Cottonwood, t14: <0.1 kU/L
Allergen, Mouse Urine Protein, e78: <0.1 kU/L
Allergen, Oak,t7: <0.1 kU/L
Allergen, P. notatum, m1: <0.1 kU/L
Aspergillus fumigatus, m3: <0.1 kU/L
Bermuda Grass: 0.86 kU/L — ABNORMAL HIGH
COMMON RAGWEED: 0.51 kU/L — AB
Cat Dander: <0.1 kU/L
D. FARINAE: 0.4 kU/L — AB
DOG DANDER: 0.22 kU/L — AB
IGE (IMMUNOGLOBULIN E), SERUM: 483 kU/L — AB (ref <115)
JOHNSON GRASS: 3.88 kU/L — AB
Pecan/Hickory Tree IgE: <0.1 kU/L
SHEEP SORREL IGE: 0.47 kU/L — AB
TIMOTHY GRASS: 66.5 kU/L — AB

## 2016-03-04 ENCOUNTER — Other Ambulatory Visit: Payer: Self-pay | Admitting: Gastroenterology

## 2016-03-04 DIAGNOSIS — R1310 Dysphagia, unspecified: Secondary | ICD-10-CM

## 2016-03-04 DIAGNOSIS — R1013 Epigastric pain: Secondary | ICD-10-CM

## 2016-03-04 DIAGNOSIS — K219 Gastro-esophageal reflux disease without esophagitis: Secondary | ICD-10-CM

## 2016-03-05 NOTE — Progress Notes (Signed)
LMTCB

## 2016-03-09 ENCOUNTER — Ambulatory Visit
Admission: RE | Admit: 2016-03-09 | Discharge: 2016-03-09 | Disposition: A | Discharge status: Home / Self Care | Payer: BLUE CROSS/BLUE SHIELD | Source: Ambulatory Visit | Attending: Gastroenterology | Admitting: Gastroenterology

## 2016-03-09 ENCOUNTER — Encounter (INDEPENDENT_AMBULATORY_CARE_PROVIDER_SITE_OTHER): Payer: Self-pay

## 2016-03-09 ENCOUNTER — Ambulatory Visit (INDEPENDENT_AMBULATORY_CARE_PROVIDER_SITE_OTHER)
Admission: RE | Admit: 2016-03-09 | Discharge: 2016-03-09 | Disposition: A | Discharge status: Home / Self Care | Payer: BLUE CROSS/BLUE SHIELD | Source: Ambulatory Visit | Attending: Internal Medicine | Admitting: Internal Medicine

## 2016-03-09 DIAGNOSIS — R05 Cough: Secondary | ICD-10-CM | POA: Diagnosis not present

## 2016-03-09 DIAGNOSIS — R058 Other specified cough: Secondary | ICD-10-CM

## 2016-03-09 DIAGNOSIS — R1310 Dysphagia, unspecified: Secondary | ICD-10-CM

## 2016-03-09 DIAGNOSIS — R1013 Epigastric pain: Secondary | ICD-10-CM

## 2016-03-09 DIAGNOSIS — K219 Gastro-esophageal reflux disease without esophagitis: Secondary | ICD-10-CM

## 2016-03-09 NOTE — Progress Notes (Signed)
LMTCB

## 2016-03-09 NOTE — Progress Notes (Signed)
Spoke with pt and notified of results per Dr. Wert. Pt verbalized understanding and denied any questions. 

## 2016-03-10 ENCOUNTER — Ambulatory Visit: Payer: BLUE CROSS/BLUE SHIELD | Admitting: Cardiology

## 2016-04-01 ENCOUNTER — Ambulatory Visit (INDEPENDENT_AMBULATORY_CARE_PROVIDER_SITE_OTHER): Payer: BLUE CROSS/BLUE SHIELD | Admitting: Internal Medicine

## 2016-04-01 ENCOUNTER — Encounter: Payer: Self-pay | Admitting: Internal Medicine

## 2016-04-01 ENCOUNTER — Telehealth: Payer: Self-pay | Admitting: Cardiology

## 2016-04-01 VITALS — BP 108/72 | HR 87 | Ht 59.0 in | Wt 156.2 lb

## 2016-04-01 DIAGNOSIS — R05 Cough: Secondary | ICD-10-CM | POA: Diagnosis not present

## 2016-04-01 DIAGNOSIS — R058 Other specified cough: Secondary | ICD-10-CM

## 2016-04-01 MED ORDER — PANTOPRAZOLE SODIUM 40 MG PO TBEC: 40.0000 mg | DELAYED_RELEASE_TABLET | Freq: Every day | ORAL | 2 refills | Status: DC

## 2016-04-01 MED ORDER — MONTELUKAST SODIUM 10 MG PO TABS: 10.0000 mg | ORAL_TABLET | Freq: Every day | ORAL | 11 refills | Status: DC

## 2016-04-01 NOTE — Telephone Encounter (Signed)
Patient called [place] after she had been seen with Dr Gustavus Bryant office today.

## 2016-04-01 NOTE — Telephone Encounter (Signed)
New message    Pt calling about swelling  Pt c/o swelling: STAT is pt has developed SOB within 24 hours  1. How long have you been experiencing swelling? 2 weeks   2. Where is the swelling located? Hands, legs, ankles and eyes and knee pain  3.  Are you currently taking a "fluid pill"? No   4.  Are you currently SOB? yes  5.  Have you traveled recently? no

## 2016-04-01 NOTE — Patient Instructions (Addendum)
Add singulair 10 mg each pm   If not better in 2 weeks call to schedule  methacholine challenge test to prove whether you have asthma or not - call [facility] at [phone]   Pantoprazole (protonix) 40 mg   Take  30-60 min before first meal of the day and Pepcid (famotidine)  20 mg one @  bedtime  X 3 months   Please schedule a follow up visit in 3 months but call sooner if needed

## 2016-04-01 NOTE — Telephone Encounter (Signed)
Returned call to patient. She stated she's been having swelling in her eyes, face, hands, and ankles that started around 2 weeks ago. It seems to decrease at night while resting/sleeping but then progressively increases throughout the day with activity. She says she has an ongoing cough she discussed with Dr Melvyn Novas today at office visit. She is concerned it is r/t Norvasc (she started on 02/18/16) or Crestor (she started on 02/09/16). BP runs around 109-116/70's, HR 87 per patient. She also expressed her weight has increased over the past weeks/months. According to [facility], weight on dates as follows:  7/18  149lb  8/29 153 lb 9.6oz  9/28  156 lb 3.2oz. She wants to know if she needs to stop taking her norvasc or crestor. Advised patient I will route information to Dr Percival Spanish for advice.

## 2016-04-01 NOTE — Telephone Encounter (Signed)
Looks like she has an appt today with Dr. Melvyn Novas.  Please confirm and he can address at that appt.

## 2016-04-01 NOTE — Progress Notes (Signed)
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 Pulmonary Consult    Referred by Dr. Leighton Ruff for eval of pulmonary nodule. She c/o right side back pain since June 2017. She states that the pain occurs with exertion and also when she coughs. She was seen here for cough in 2015 and states her cough is unchanged since then.   walks daily does ok ok flat for up to an hour good pace / hills are a problem/ carrying objects Cough is worse p supper and when lies down and pretty much every night/not so much  wakes up each am maybe a tsp of clear mucus  Back pain is localized just to R of midline really dates back to 2011/ aggravated by coughing and worse since cough has been worse.  Pred/ rx for asthma/ rhinitis never convincingly helped cough or sob  rec Pepcid ac 20 mg at bedtime  For drainage / throat tickle try take CHLORPHENIRAMINE  4 mg - take one every 4 hours as needed -  Did not try  GERD diet     03/02/2016  f/u ov/Wert re:  Cough x 2011  Chief Complaint  Patient presents with  . Follow-up    Cough is better on some days. She c/o CP that comes and goes.    cardiac w/u neg  by Hochrein, continue to have same pattern cough as above, no sign sob rec Pantoprazole (protonix) 40 mg  Take  30-60 min before first meal of the day and Pepcid (famotidine)  20 mg at supper  until return to office - this is the best way to tell whether stomach acid is contributing to your problem.   For drainage / throat tickle try take CHLORPHENIRAMINE  4 mg - take one every 4 hours as needed  Please see patient coordinator before you leave today  to schedule sinus ct  Please remember to go to the lab department downstairs for your tests - we will call you with the results when they are available. Please schedule a follow up office visit in 4 weeks, sooner if needed with all active meds in hand    04/01/2016  f/u ov/Wert re: daily cough x 2011 /brought all meds/ using ppi and h1h2 hs  Chief Complaint  Patient presents with  . Follow-up    Pt. states she is feeling great, Still coughing, it has been a little better, no sob, no chest pain or chest tightness  ppi helped HB but no impact on cough after stirring x tbsp x up to 30 min then ok until encounter changes in environment  like perfume or walking anywhere/flares at bedtime unless props up on 3 pillows  Prednisone > did help breathing a little/ maybe the cough but the inhaler made it  worse     No obvious day to day or daytime variability or assoc excess/ purulent sputum or mucus plugs   Or  chest tightness, subjective wheeze or overt sinus or hb symptoms. No unusual exp hx or h/o childhood pna/ asthma or knowledge of premature birth.  Also denies any obvious fluctuation of symptoms with weather or environmental changes or other aggravating or alleviating factors except as outlined above   Current Medications, Allergies, Complete Past Medical History, Past Surgical History, Family History, and Social History were reviewed in [employer] record.  ROS  The following are not active complaints unless bolded sore throat,  dysphagia, dental problems, itching, sneezing,  nasal congestion or excess/ purulent secretions, ear ache,   fever, chills, sweats, unintended wt loss, classically pleuritic or exertional cp, hemoptysis,  orthopnea pnd or leg swelling, presyncope, palpitations, abdominal pain, anorexia, nausea, vomiting, diarrhea  or change in bowel or bladder habits, change in stools or urine, dysuria,hematuria,  rash, arthralgias, visual complaints, headache, numbness, weakness or ataxia or problems with walking or coordination,  change in mood/affect or memory.                  Objective:   Physical Exam    amb wf nad chewing  Juicy fruit/ unusual affect    04/01/2016        156   03/02/2016       154   01/20/16 149 lb (67.586 kg)  08/23/13 143 lb (64.864 kg)  08/17/13 145 lb (65.772 kg)    Vital signs reviewed   HEENT: nl dentition, turbinates, and oropharynx. Nl external ear canals without cough reflex   NECK :  without JVD/Nodes/TM/ nl carotid upstrokes bilaterally   LUNGS: no acc muscle use,  Nl contour chest which is clear to A and P bilaterally  - no cough on inspiration now  CV:  RRR  no s3 or murmur or increase in P2, no edema   ABD:  soft and nontender with nl inspiratory excursion in the supine position. No bruits or organomegaly, bowel sounds nl  MS:  Nl gait/ ext warm without deformities, calf tenderness, cyanosis or clubbing No obvious joint restrictions   SKIN: warm and dry without lesions    NEURO:  alert, approp, nl sensorium with  no motor deficits      I personally reviewed images and agree with radiology impression as follows:  CT Chest   12/25/15 1. Small nodules are again identified. The largest has a mean diameter of 3 mm. No follow-up needed if patient is low-risk (and has no known or suspected primary neoplasm). Non-contrast chest CT can be considered in 12 months if patient is high-risk.   I personally reviewed images and agree with radiology impression as  follows:  CXR:   02/17/16  No active cardiopulmonary disease.     Assessment:

## 2016-04-02 NOTE — Telephone Encounter (Signed)
Returned call - rings w/o answer or VM pickup.

## 2016-04-02 NOTE — Telephone Encounter (Signed)
Recommendations communicated to patient, who voiced understanding and thanks. She will follow up in ~1 week w updates and any concerns regarding BP trends or symptoms.

## 2016-04-02 NOTE — Telephone Encounter (Signed)
Discontinue amlodipine for now.  BP at Dr. Melvyn Novas yesterday was 108/72.  Can she monitor home BP?  If so lets not replace the amlodipine for now, but have her call back in a week and let us know what home BP readings are doing and be sure swelling has decreased.Marland Kitchen

## 2016-04-02 NOTE — Telephone Encounter (Signed)
Left msg for patient to call. 

## 2016-04-02 NOTE — Telephone Encounter (Signed)
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  Please f/u with pt

## 2016-04-02 NOTE — Telephone Encounter (Signed)
Patient returned call. She is having a very obvious problem of swelling in hands, feet, and face, (notes especially puffy around the eyes). The onset of this to her seems to be in concert with starting her new meds crestor and amlodipine, which were started at her recent hospital discharge.  We discussed common SEs with these meds and she is aware that amlodipine can cause the type of swelling she describes. Informed her I would route for recommendations & follow up as soon as possible.

## 2016-04-04 NOTE — Assessment & Plan Note (Addendum)
rec hs h2 and h1 08/17/13 > and 01/20/16 > did not take h1 as directed, did not take ppi ac as rec - Allergy profile 03/02/16>  Eos 0.0/  IgE  483  Grass > tree > Ragweed, dog and dust - Sinus CT 03/09/2016 > Negative exam. No evidence of acute or chronic sinus disease. - trial of singulair 10 mg 04/01/2016    She does have atopy and suspect she may have cough variant asthma component that did not respond to inhalers because they irritated the upper airway component of the cough.   First step is therefore add singulair x 2 weeks, then proceed to MCT  I had an extended discussion with the patient reviewing all relevant studies completed to date and  lasting 15 to 20 minutes of a 25 minute visit    Each maintenance medication was reviewed in detail including most importantly the difference between maintenance and prns and under what circumstances the prns are to be triggered using an action plan format that is not reflected in the computer generated alphabetically organized AVS.    Please see instructions for details which were reviewed in writing and the patient given a copy highlighting the part that I personally wrote and discussed at today's ov.

## 2016-04-27 ENCOUNTER — Other Ambulatory Visit: Payer: Self-pay | Admitting: Internal Medicine

## 2016-04-27 DIAGNOSIS — R05 Cough: Secondary | ICD-10-CM

## 2016-04-27 DIAGNOSIS — R058 Other specified cough: Secondary | ICD-10-CM

## 2016-05-21 ENCOUNTER — Telehealth: Payer: Self-pay | Admitting: *Deleted

## 2016-05-25 NOTE — Telephone Encounter (Signed)
No new note.  Chart opened in error.

## 2016-07-01 ENCOUNTER — Ambulatory Visit: Payer: BLUE CROSS/BLUE SHIELD | Admitting: Internal Medicine

## 2016-09-07 ENCOUNTER — Encounter (HOSPITAL_COMMUNITY): Payer: Self-pay | Admitting: Emergency Medicine

## 2016-09-07 ENCOUNTER — Emergency Department (HOSPITAL_COMMUNITY): Payer: Self-pay

## 2016-09-07 ENCOUNTER — Emergency Department (HOSPITAL_COMMUNITY)
Admission: EM | Admit: 2016-09-07 | Discharge: 2016-09-07 | Disposition: A | Discharge status: Home / Self Care | Payer: Self-pay | Attending: Emergency Medicine | Admitting: Emergency Medicine

## 2016-09-07 DIAGNOSIS — J209 Acute bronchitis, unspecified: Secondary | ICD-10-CM | POA: Insufficient documentation

## 2016-09-07 DIAGNOSIS — Z85038 Personal history of other malignant neoplasm of large intestine: Secondary | ICD-10-CM | POA: Insufficient documentation

## 2016-09-07 DIAGNOSIS — E876 Hypokalemia: Secondary | ICD-10-CM | POA: Insufficient documentation

## 2016-09-07 DIAGNOSIS — J45909 Unspecified asthma, uncomplicated: Secondary | ICD-10-CM | POA: Insufficient documentation

## 2016-09-07 LAB — COMPREHENSIVE METABOLIC PANEL
ALT: 25 U/L (ref 14–54)
ANION GAP: 8 (ref 5–15)
AST: 40 U/L (ref 15–41)
Albumin: 3.1 g/dL — ABNORMAL LOW (ref 3.5–5.0)
Alkaline Phosphatase: 67 U/L (ref 38–126)
BILIRUBIN TOTAL: 0.7 mg/dL (ref 0.3–1.2)
BUN: 9 mg/dL (ref 6–20)
CO2: 23 mmol/L (ref 22–32)
Calcium: 8 mg/dL — ABNORMAL LOW (ref 8.9–10.3)
Chloride: 109 mmol/L (ref 101–111)
Creatinine, Ser: 0.7 mg/dL (ref 0.44–1.00)
GFR calc Af Amer: >60 mL/min (ref >60)
Glucose, Bld: 76 mg/dL (ref 65–99)
POTASSIUM: 2.8 mmol/L — AB (ref 3.5–5.1)
Sodium: 140 mmol/L (ref 135–145)
Total Protein: 6 g/dL — ABNORMAL LOW (ref 6.5–8.1)

## 2016-09-07 LAB — CBC WITH DIFFERENTIAL/PLATELET
BASOS ABS: 0 10*3/uL (ref 0.0–0.1)
Basophils Relative: 0 %
EOS PCT: 0 %
Eosinophils Absolute: 0 10*3/uL (ref 0.0–0.7)
HCT: 33.1 % — ABNORMAL LOW (ref 36.0–46.0)
Hemoglobin: 11 g/dL — ABNORMAL LOW (ref 12.0–15.0)
LYMPHS PCT: 27 %
Lymphs Abs: 1 10*3/uL (ref 0.7–4.0)
MCH: 27 pg (ref 26.0–34.0)
MCHC: 33.2 g/dL (ref 30.0–36.0)
MCV: 81.3 fL (ref 78.0–100.0)
Monocytes Absolute: 0.3 10*3/uL (ref 0.1–1.0)
Monocytes Relative: 9 %
NEUTROS ABS: 2.3 10*3/uL (ref 1.7–7.7)
Neutrophils Relative %: 64 %
PLATELETS: 107 10*3/uL — AB (ref 150–400)
RBC: 4.07 MIL/uL (ref 3.87–5.11)
RDW: 13.3 % (ref 11.5–15.5)
WBC: 3.5 10*3/uL — AB (ref 4.0–10.5)

## 2016-09-07 LAB — I-STAT TROPONIN, ED: TROPONIN I, POC: 0.02 ng/mL (ref 0.00–0.08)

## 2016-09-07 LAB — MAGNESIUM: MAGNESIUM: 1.6 mg/dL — AB (ref 1.7–2.4)

## 2016-09-07 LAB — LIPASE, BLOOD: Lipase: 21 U/L (ref 11–51)

## 2016-09-07 MED ORDER — ONDANSETRON HCL 4 MG/2ML IJ SOLN
4.0000 mg | Freq: Once | INTRAMUSCULAR | Status: AC
  Administered 2016-09-07: 4 mg via INTRAVENOUS
  Filled 2016-09-07: qty 2

## 2016-09-07 MED ORDER — POTASSIUM CHLORIDE CRYS ER 20 MEQ PO TBCR
40.0000 meq | EXTENDED_RELEASE_TABLET | Freq: Once | ORAL | Status: AC
  Administered 2016-09-07: 40 meq via ORAL
  Filled 2016-09-07: qty 2

## 2016-09-07 MED ORDER — POTASSIUM CHLORIDE CRYS ER 20 MEQ PO TBCR: 20.0000 meq | EXTENDED_RELEASE_TABLET | Freq: Two times a day (BID) | ORAL | 0 refills | Status: DC

## 2016-09-07 MED ORDER — ALBUTEROL SULFATE HFA 108 (90 BASE) MCG/ACT IN AERS
2.0000 | INHALATION_SPRAY | Freq: Once | RESPIRATORY_TRACT | Status: AC
  Administered 2016-09-07: 2 via RESPIRATORY_TRACT
  Filled 2016-09-07: qty 6.7

## 2016-09-07 MED ORDER — MAGNESIUM SULFATE 2 GM/50ML IV SOLN
2.0000 g | Freq: Once | INTRAVENOUS | Status: AC
  Administered 2016-09-07: 2 g via INTRAVENOUS
  Filled 2016-09-07: qty 50

## 2016-09-07 MED ORDER — ACETAMINOPHEN 325 MG PO TABS
650.0000 mg | ORAL_TABLET | Freq: Once | ORAL | Status: AC
  Administered 2016-09-07: 650 mg via ORAL
  Filled 2016-09-07: qty 2

## 2016-09-07 MED ORDER — IPRATROPIUM-ALBUTEROL 0.5-2.5 (3) MG/3ML IN SOLN
3.0000 mL | Freq: Once | RESPIRATORY_TRACT | Status: AC
  Administered 2016-09-07: 3 mL via RESPIRATORY_TRACT
  Filled 2016-09-07: qty 3

## 2016-09-07 MED ORDER — SODIUM CHLORIDE 0.9 % IV BOLUS (SEPSIS)
1000.0000 mL | Freq: Once | INTRAVENOUS | Status: AC
Start: 2016-09-07 — End: 2016-09-07
  Administered 2016-09-07: 1000 mL via INTRAVENOUS

## 2016-09-07 MED ORDER — DEXAMETHASONE SODIUM PHOSPHATE 10 MG/ML IJ SOLN
10.0000 mg | Freq: Once | INTRAMUSCULAR | Status: AC
  Administered 2016-09-07: 10 mg via INTRAVENOUS
  Filled 2016-09-07: qty 1

## 2016-09-07 NOTE — ED Notes (Signed)
Off floor for testing 

## 2016-09-07 NOTE — ED Notes (Signed)
Notified MD of abnormal lab/s

## 2016-09-07 NOTE — ED Notes (Signed)
Attempted to collect blood with IV placement and butterfly-no blood return-MD notified-OK to attempt again once bolus is completed

## 2016-09-07 NOTE — ED Provider Notes (Signed)
[facility] DEPT Provider Note   CSN: [id number] Arrival date & time: 09/07/16  [id number]     History   Chief Complaint Chief Complaint  Patient presents with  . Cough  . Abdominal Pain    HPI Kendra Gilbert is a 61 y.o. female.  HPI  61 year old female presents with cough, fever, sore throat, and myalgias for the last 3 days. Patient states that last fall she was diagnosed with possible asthma/bronchitis. She has never been a smoker before. She's been having cough with clear sputum, wheezing, fever up to 100, sore throat, and body aches since 3 days ago. Every time she coughs it makes her left upper quadrant her. She states she's had on and off bloating and left upper quadrant pain for several months. Alternates between diarrhea and constipation. The pain in her left upper quadrant is the same type of pain but worse since the cough started. There is some chest pain with cough as well as some wheezing. She feels intermittent short of breath. Has had a couple episodes of vomiting. Tried Tylenol and TheraFlu honey with tea.  Past Medical History:  Diagnosis Date  . Asthma   . Colon cancer ([place])   . Coronary artery calcification    a. POET in 2015 with no acute findings.  . H/O: rheumatic fever   . Lung disease    Lung nodule  . Pneumonia   . Rheumatoid arthritis [facility])     Patient Active Problem List   Diagnosis Date Noted  . Chest pain 02/17/2016  . Dyslipidemia 02/17/2016  . Family history of heart disease   . Dyspnea 01/20/2016  . Multiple pulmonary nodules determined by computed tomography of lung 01/20/2016  . Coronary artery calcification 08/23/2013  . Upper airway cough syndrome 08/19/2013  . Exertional chest pain 08/19/2013    Past Surgical History:  Procedure Laterality Date  . ABDOMINAL HYSTERECTOMY      OB History    No data available       Home Medications    Prior to Admission medications   Medication Sig Start Date End Date Taking? Authorizing  Provider  amLODipine (NORVASC) 5 MG tablet Take 1 tablet (5 mg total) by mouth daily. 02/18/16  Yes Theodis Blaze, MD  chlorpheniramine (CHLOR-TRIMETON) 4 MG tablet Take 4 mg by mouth 2 (two) times daily as needed for allergies.   Yes Historical Provider, MD  famotidine (PEPCID) 20 MG tablet Take 20 mg by mouth 2 (two) times daily.   Yes Historical Provider, MD  montelukast (SINGULAIR) 10 MG tablet Take 1 tablet (10 mg total) by mouth at bedtime. 04/01/16  Yes Tanda Rockers, MD  pantoprazole (PROTONIX) 40 MG tablet TAKE 1 TABLET(40 MG) BY MOUTH DAILY 30 TO 60 MINUTES BEFORE FIRST MEAL OF THE DAY 04/27/16  Yes Tanda Rockers, MD  rosuvastatin (CRESTOR) 20 MG tablet Take 1 tablet (20 mg total) by mouth daily. 02/09/16 09/07/16 Yes Minus Breeding, MD  triamcinolone cream (KENALOG) 0.1 % Apply 1 application topically 2 (two) times daily as needed for rash or irritation. Insect bites 02/11/16  Yes Historical Provider, MD  potassium chloride SA (K-DUR,KLOR-CON) 20 MEQ tablet Take 1 tablet (20 mEq total) by mouth 2 (two) times daily. 09/07/16   Sherwood Gambler, MD    Family History Family History  Problem Relation Age of Onset  . CAD Mother 45    CABG/stents  . Breast cancer Mother   . Asthma Father   . CAD Father 72  .  Allergies Father   . CAD Brother 53    Died with MI  . CAD Brother 27    9 stents  . CAD Brother 59    7 stents    Social History Social History  Substance Use Topics  . Smoking status: Never Smoker  . Smokeless tobacco: Never Used  . Alcohol use No     Allergies   Bee venom; Other; Aspirin; Erythromycin; and Penicillins   Review of Systems Review of Systems  Constitutional: Positive for chills and fever.  HENT: Positive for sore throat.   Respiratory: Positive for cough, shortness of breath and wheezing.   Cardiovascular: Positive for chest pain.  Gastrointestinal: Positive for abdominal pain, constipation, diarrhea and vomiting.  Musculoskeletal: Positive for  myalgias.  All other systems reviewed and are negative.    Physical Exam Updated Vital Signs BP 133/75   Pulse 66   Temp 99.9 F (37.7 C) (Oral)   Resp 12   Ht 4\' 11"  (1.499 m)   Wt 155 lb (70.3 kg)   SpO2 96%   BMI 31.31 kg/m   Physical Exam  Constitutional: She is oriented to person, place, and time. She appears well-developed and well-nourished. No distress.  HENT:  Head: Normocephalic and atraumatic.  Right Ear: External ear normal.  Left Ear: External ear normal.  Nose: Nose normal.  Mouth/Throat: Oropharynx is clear and moist. No oropharyngeal exudate.  Eyes: Right eye exhibits no discharge. Left eye exhibits no discharge.  Cardiovascular: Normal rate, regular rhythm and normal heart sounds.   Pulmonary/Chest: Effort normal. No accessory muscle usage. No tachypnea. No respiratory distress. She has wheezes (scattered wheezes, mild).  Abdominal: Soft. There is tenderness in the left upper quadrant.  No obvious abd wall hernia on exam  Neurological: She is alert and oriented to person, place, and time.  Skin: Skin is warm and dry. She is not diaphoretic.  Nursing note and vitals reviewed.    ED Treatments / Results  Labs (all labs ordered are listed, but only abnormal results are displayed) Labs Reviewed  COMPREHENSIVE METABOLIC PANEL - Abnormal; Notable for the following:       Result Value   Potassium 2.8 (*)    Calcium 8.0 (*)    Total Protein 6.0 (*)    Albumin 3.1 (*)    All other components within normal limits  CBC WITH DIFFERENTIAL/PLATELET - Abnormal; Notable for the following:    WBC 3.5 (*)    Hemoglobin 11.0 (*)    HCT 33.1 (*)    Platelets 107 (*)    All other components within normal limits  MAGNESIUM - Abnormal; Notable for the following:    Magnesium 1.6 (*)    All other components within normal limits  LIPASE, BLOOD  I-STAT TROPOININ, ED    EKG  EKG Interpretation None     ED ECG REPORT   Date: 09/07/2016  Rate: 72  Rhythm:  normal sinus rhythm  QRS Axis: normal  Intervals: normal  ST/T Wave abnormalities: normal  Conduction Disutrbances:none  Narrative Interpretation:   Old EKG Reviewed: unchanged  I have personally reviewed the EKG tracing and agree with the computerized printout as noted.   Radiology Dg Chest 2 View  Result Date: 09/07/2016 CLINICAL DATA:  Chronic shortness of breath, history of asthma, some chest pain over the last few days, cough EXAM: CHEST  2 VIEW COMPARISON:  Chest x-ray of 02/17/2016 FINDINGS: No pneumonia or effusion is seen. The lungs are not optimally aerated.  There is some peribronchial thickening which may indicate bronchitis. Mediastinal and hilar contours are unremarkable. The heart is within normal limits in size. No bony abnormality is seen. IMPRESSION: No pneumonia or effusion.  Question bronchitis. Electronically Signed   By: Ivar Drape M.D.   On: 09/07/2016 09:01    Procedures Procedures (including critical care time)  Medications Ordered in ED Medications  ipratropium-albuterol (DUONEB) 0.5-2.5 (3) MG/3ML nebulizer solution 3 mL (3 mLs Nebulization Given 09/07/16 0900)  sodium chloride 0.9 % bolus 1,000 mL (0 mLs Intravenous Stopped 09/07/16 1020)  acetaminophen (TYLENOL) tablet 650 mg (650 mg Oral Given 09/07/16 0840)  ondansetron (ZOFRAN) injection 4 mg (4 mg Intravenous Given 09/07/16 0838)  albuterol (PROVENTIL HFA;VENTOLIN HFA) 108 (90 Base) MCG/ACT inhaler 2 puff (2 puffs Inhalation Given 09/07/16 1021)  potassium chloride SA (K-DUR,KLOR-CON) CR tablet 40 mEq (40 mEq Oral Given 09/07/16 1124)  magnesium sulfate IVPB 2 g 50 mL (0 g Intravenous Stopped 09/07/16 1254)  dexamethasone (DECADRON) injection 10 mg (10 mg Intravenous Given 09/07/16 1238)     Initial Impression / Assessment and Plan / ED Course  I have reviewed the triage vital signs and the nursing notes.  Pertinent labs & imaging results that were available during my care of the patient were reviewed by me and  considered in my medical decision making (see chart for details).  Clinical Course as of Sep 07 1648  Tue Sep 07, 2016  0817 Given patient's history of bronchitis and wheezing, will try a breathing treatment given intermittent expiratory wheezes. Her overall presentation sounds consistent with a mild flulike or viral illness. She is overall well appearing. Abdominal pain is most likely muscular given the length of symptoms and worsening with coughing. Will screen with labs. I don't think imaging is needed unless there is something significantly abnormal on labs.  [SG]  1033 Patient is feeling better after breathing treatment. Has some more prominent wheezes and better airflow. Will give an albuterol inhaler for now but also to take home.  [SG]  1207 Magnesium little low, will give IV magnesium. Will also give IV Decadron as she is requesting no prednisone because of how much it makes her swell.  [SG]    Clinical Course User Index [SG] Sherwood Gambler, MD    Patient appears to have mild influenza-like illness or bronchitis. Given the prominent wheezing and the response to albuterol this is more likely bronchitis. She is feeling better. She does not want prednisone because it makes her swell so much, given a milder presentation I have given her IV Decadron. Potassium and magnesium were repleted and sent home with electrolyte replacement. She overall appears well. I think her abdominal pain is musculoskeletal. She has GI follow-up next week. Discussed return precautions.  Final Clinical Impressions(s) / ED Diagnoses   Final diagnoses:  Acute bronchitis, unspecified organism  Hypokalemia    New Prescriptions Discharge Medication List as of 09/07/2016  1:00 PM    START taking these medications   Details  potassium chloride SA (K-DUR,KLOR-CON) 20 MEQ tablet Take 1 tablet (20 mEq total) by mouth 2 (two) times daily., Starting Tue 09/07/2016, Print         Sherwood Gambler, MD 09/07/16 [phone]

## 2016-09-07 NOTE — ED Notes (Signed)
I attempted twice to collect labs and was unsuccessful 

## 2016-09-07 NOTE — ED Triage Notes (Signed)
Pt is c/o cough, clear foamy phlegm, body aches, fever, chills, vomiting, LUQ pain, loose stools, and nausea  Pt states the cough started over the weekend but the other sxs started a couple weeks ago  Pt states she has taken tylenol but it makes her feel tired and nauseate and she took some theraflu honey and tea formula and it upset her stomach  Pt states she has had decreased appetite

## 2016-11-24 ENCOUNTER — Other Ambulatory Visit: Payer: Self-pay | Admitting: Critical Care Medicine

## 2016-11-24 LAB — GLUCOSE, POCT (MANUAL RESULT ENTRY): POC Glucose: 109 mg/dl — AB (ref 70–99)

## 2016-11-24 MED ORDER — AMLODIPINE BESYLATE 5 MG PO TABS: 5.0000 mg | ORAL_TABLET | Freq: Every day | ORAL | 1 refills | Status: DC

## 2017-02-16 ENCOUNTER — Emergency Department (HOSPITAL_COMMUNITY): Payer: Self-pay

## 2017-02-16 ENCOUNTER — Emergency Department (HOSPITAL_COMMUNITY)
Admission: EM | Admit: 2017-02-16 | Discharge: 2017-02-16 | Disposition: A | Discharge status: Home / Self Care | Payer: Self-pay | Attending: Emergency Medicine | Admitting: Emergency Medicine

## 2017-02-16 ENCOUNTER — Encounter (HOSPITAL_COMMUNITY): Payer: Self-pay

## 2017-02-16 DIAGNOSIS — R109 Unspecified abdominal pain: Secondary | ICD-10-CM | POA: Insufficient documentation

## 2017-02-16 DIAGNOSIS — Z79899 Other long term (current) drug therapy: Secondary | ICD-10-CM | POA: Insufficient documentation

## 2017-02-16 DIAGNOSIS — R197 Diarrhea, unspecified: Secondary | ICD-10-CM | POA: Insufficient documentation

## 2017-02-16 LAB — COMPREHENSIVE METABOLIC PANEL
ALBUMIN: 3.7 g/dL (ref 3.5–5.0)
ALK PHOS: 93 U/L (ref 38–126)
ALT: 29 U/L (ref 14–54)
AST: 29 U/L (ref 15–41)
Anion gap: 8 (ref 5–15)
BUN: 11 mg/dL (ref 6–20)
CHLORIDE: 108 mmol/L (ref 101–111)
CO2: 27 mmol/L (ref 22–32)
CREATININE: 0.6 mg/dL (ref 0.44–1.00)
Calcium: 8.9 mg/dL (ref 8.9–10.3)
GFR calc non Af Amer: >60 mL/min (ref >60)
GLUCOSE: 76 mg/dL (ref 65–99)
Potassium: 3.4 mmol/L — ABNORMAL LOW (ref 3.5–5.1)
SODIUM: 143 mmol/L (ref 135–145)
Total Bilirubin: 0.7 mg/dL (ref 0.3–1.2)
Total Protein: 7.3 g/dL (ref 6.5–8.1)

## 2017-02-16 LAB — CBC WITH DIFFERENTIAL/PLATELET
BASOS ABS: 0 10*3/uL (ref 0.0–0.1)
BASOS PCT: 0 %
EOS ABS: 0 10*3/uL (ref 0.0–0.7)
EOS PCT: 1 %
HCT: 37.8 % (ref 36.0–46.0)
HEMOGLOBIN: 13.2 g/dL (ref 12.0–15.0)
Lymphocytes Relative: 34 %
Lymphs Abs: 1.9 10*3/uL (ref 0.7–4.0)
MCH: 28.6 pg (ref 26.0–34.0)
MCHC: 34.9 g/dL (ref 30.0–36.0)
MCV: 81.8 fL (ref 78.0–100.0)
Monocytes Absolute: 0.3 10*3/uL (ref 0.1–1.0)
Monocytes Relative: 6 %
NEUTROS PCT: 59 %
Neutro Abs: 3.4 10*3/uL (ref 1.7–7.7)
PLATELETS: 157 10*3/uL (ref 150–400)
RBC: 4.62 MIL/uL (ref 3.87–5.11)
RDW: 12.8 % (ref 11.5–15.5)
WBC: 5.7 10*3/uL (ref 4.0–10.5)

## 2017-02-16 LAB — URINALYSIS, ROUTINE W REFLEX MICROSCOPIC
BILIRUBIN URINE: NEGATIVE
GLUCOSE, UA: NEGATIVE mg/dL
HGB URINE DIPSTICK: NEGATIVE
Ketones, ur: NEGATIVE mg/dL
Nitrite: NEGATIVE
Protein, ur: NEGATIVE mg/dL
SPECIFIC GRAVITY, URINE: 1.02 (ref 1.005–1.030)
pH: 5 (ref 5.0–8.0)

## 2017-02-16 LAB — POC OCCULT BLOOD, ED: Fecal Occult Bld: NEGATIVE

## 2017-02-16 LAB — LIPASE, BLOOD: Lipase: 22 U/L (ref 11–51)

## 2017-02-16 MED ORDER — MORPHINE SULFATE (PF) 2 MG/ML IV SOLN
4.0000 mg | Freq: Once | INTRAVENOUS | Status: AC
  Administered 2017-02-16: 4 mg via INTRAVENOUS
  Filled 2017-02-16: qty 2

## 2017-02-16 MED ORDER — SODIUM CHLORIDE 0.9 % IJ SOLN
INTRAMUSCULAR | Status: AC
  Filled 2017-02-16: qty 50

## 2017-02-16 MED ORDER — IOPAMIDOL (ISOVUE-300) INJECTION 61%
INTRAVENOUS | Status: AC
  Filled 2017-02-16: qty 100

## 2017-02-16 MED ORDER — IOPAMIDOL (ISOVUE-300) INJECTION 61%
100.0000 mL | Freq: Once | INTRAVENOUS | Status: AC | PRN
  Administered 2017-02-16: 100 mL via INTRAVENOUS

## 2017-02-16 MED ORDER — LOPERAMIDE HCL 2 MG PO CAPS: 2.0000 mg | ORAL_CAPSULE | Freq: Four times a day (QID) | ORAL | 0 refills | Status: DC | PRN

## 2017-02-16 MED ORDER — SODIUM CHLORIDE 0.9 % IV BOLUS (SEPSIS)
1000.0000 mL | Freq: Once | INTRAVENOUS | Status: AC
  Administered 2017-02-16: 1000 mL via INTRAVENOUS

## 2017-02-16 MED ORDER — ONDANSETRON HCL 4 MG/2ML IJ SOLN
4.0000 mg | Freq: Once | INTRAMUSCULAR | Status: AC
  Administered 2017-02-16: 4 mg via INTRAVENOUS
  Filled 2017-02-16: qty 2

## 2017-02-16 NOTE — ED Provider Notes (Signed)
[facility] DEPT Provider Note   CSN: [id number] Arrival date & time: 02/16/17  5638     History   Chief Complaint Chief Complaint  Patient presents with  . Diarrhea    HPI Sophiea Ueda is a 61 y.o. female.  HPI   61 year old female with hx of colon cancer, CAD here with c/o diarrhea.  Patient states for the past 8 days she has had persistent abdominal cramping, associate nausea, occasional vomiting, and now having persistent diarrhea. She he reported having at least 5 episodes of mucous watery diarrhea daily without any blood. She complained of pain to her low back after bowel movement. Complaining of abdominal cramping. Having subjective fever and chills along with heartburn. Pain is mild to moderate but never fully resolved. She felt that she is very sensitive to a lot of food especially greasy food and even the smell of certain greasy foods can upset her stomach. She is unsure that this would cause her to have her diarrhea. She denies any recent medication changes, no recent antibiotic use or recent travel, no recent sick contact. She admits to having history of high blood pressure and currently not taking any blood pressure medication due to lack of insurance. No report of weight change, chest pain, constipation, urinary symptoms. No alcohol or tobacco abuse. She reported having a colonoscopy done last year when she was found to have some concerning polyps that was removed. No additional treatment except watchful waiting.    Past Medical History:  Diagnosis Date  . Asthma   . Colon cancer ([place])   . Coronary artery calcification    a. POET in 2015 with no acute findings.  . H/O: rheumatic fever   . Lung disease    Lung nodule  . Pneumonia   . Rheumatoid arthritis [facility])     Patient Active Problem List   Diagnosis Date Noted  . Chest pain 02/17/2016  . Dyslipidemia 02/17/2016  . Family history of heart disease   . Dyspnea 01/20/2016  . Multiple pulmonary nodules  determined by computed tomography of lung 01/20/2016  . Coronary artery calcification 08/23/2013  . Upper airway cough syndrome 08/19/2013  . Exertional chest pain 08/19/2013    Past Surgical History:  Procedure Laterality Date  . ABDOMINAL HYSTERECTOMY      OB History    No data available       Home Medications    Prior to Admission medications   Medication Sig Start Date End Date Taking? Authorizing Provider  amLODipine (NORVASC) 5 MG tablet Take 1 tablet (5 mg total) by mouth daily. 11/24/16   Elsie Stain, MD  chlorpheniramine (CHLOR-TRIMETON) 4 MG tablet Take 4 mg by mouth 2 (two) times daily as needed for allergies.    [provider]  famotidine (PEPCID) 20 MG tablet Take 20 mg by mouth 2 (two) times daily.    [provider]  montelukast (SINGULAIR) 10 MG tablet Take 1 tablet (10 mg total) by mouth at bedtime. 04/01/16   Tanda Rockers, MD  pantoprazole (PROTONIX) 40 MG tablet TAKE 1 TABLET(40 MG) BY MOUTH DAILY 30 TO 60 MINUTES BEFORE FIRST MEAL OF THE DAY 04/27/16   Tanda Rockers, MD  potassium chloride SA (K-DUR,KLOR-CON) 20 MEQ tablet Take 1 tablet (20 mEq total) by mouth 2 (two) times daily. 09/07/16   Sherwood Gambler, MD  rosuvastatin (CRESTOR) 20 MG tablet Take 1 tablet (20 mg total) by mouth daily. 02/09/16 09/07/16  Minus Breeding, MD  triamcinolone  cream (KENALOG) 0.1 % Apply 1 application topically 2 (two) times daily as needed for rash or irritation. Insect bites 02/11/16   [provider]    Family History Family History  Problem Relation Age of Onset  . CAD Mother 28       CABG/stents  . Breast cancer Mother   . Asthma Father   . CAD Father 8  . Allergies Father   . CAD Brother 7       Died with MI  . CAD Brother 28       9 stents  . CAD Brother 63       7 stents    Social History Social History  Substance Use Topics  . Smoking status: Never Smoker  . Smokeless tobacco: Never Used  . Alcohol use No      Allergies   Bee venom; Other; Aspirin; Erythromycin; and Penicillins   Review of Systems Review of Systems  All other systems reviewed and are negative.    Physical Exam Updated Vital Signs BP (!) 175/84 (BP Location: Left Arm)   Pulse 62   Temp 98.1 F (36.7 C) (Oral)   Resp 20   SpO2 100%   Physical Exam  Constitutional: She appears well-developed and well-nourished. No distress.  HENT:  Head: Atraumatic.  Eyes: Conjunctivae are normal.  Neck: Neck supple.  Cardiovascular: Normal rate and regular rhythm.   Pulmonary/Chest: Effort normal and breath sounds normal.  Abdominal: Soft. Bowel sounds are normal. She exhibits no distension. There is tenderness (Mild diffuse abdominal tenderness without guarding or rebound tenderness).  Musculoskeletal: She exhibits tenderness (Tenderness to right lumbosacral region on palpation without significant midline spine tenderness).  Neurological: She is alert.  Skin: No rash noted.  Psychiatric: She has a normal mood and affect.  Nursing note and vitals reviewed.    ED Treatments / Results  Labs (all labs ordered are listed, but only abnormal results are displayed) Labs Reviewed  COMPREHENSIVE METABOLIC PANEL - Abnormal; Notable for the following:       Result Value   Potassium 3.4 (*)    All other components within normal limits  URINALYSIS, ROUTINE W REFLEX MICROSCOPIC - Abnormal; Notable for the following:    APPearance HAZY (*)    Leukocytes, UA LARGE (*)    Bacteria, UA RARE (*)    Squamous Epithelial / LPF 0-5 (*)    All other components within normal limits  CBC WITH DIFFERENTIAL/PLATELET  LIPASE, BLOOD  POC OCCULT BLOOD, ED    EKG  EKG Interpretation None       Radiology Ct Abdomen Pelvis W Contrast  Result Date: 02/16/2017 CLINICAL DATA:  Unspecified abdominal pain for 8 days. History of colon surgery for carcinoma in 2017 EXAM: CT ABDOMEN AND PELVIS WITH CONTRAST TECHNIQUE: Multidetector CT imaging  of the abdomen and pelvis was performed using the standard protocol following bolus administration of intravenous contrast. CONTRAST:  138mL ISOVUE-300 IOPAMIDOL (ISOVUE-300) INJECTION 61% COMPARISON:  12/06/2015 FINDINGS: Lower chest:  No contributory findings. Hepatobiliary: No focal liver abnormality. Hepatic steatosis, most convincing on the delayed phase. No evidence of biliary obstruction or stone. Pancreas: Unremarkable. Spleen: Unremarkable. Adrenals/Urinary Tract: Negative adrenals. Thin septations seen within a cystic interpolar left renal mass measuring 11 mm. Mass has partially collapsed since prior, when it measured 18 mm. Unremarkable bladder. Stomach/Bowel:  No obstruction. No appendicitis. Vascular/Lymphatic: No acute vascular abnormality. Mild atherosclerotic calcifications of the aorta. No mass or adenopathy. Reproductive:Hysterectomy.  Negative adnexae. Other: No ascites  or pneumoperitoneum. Small volume fat herniation through the umbilicus. Musculoskeletal: No acute abnormalities. Lumbar facet arthropathy mainly at L4-5 and L5-S1. IMPRESSION: 1. No acute finding to explain pain. 2. Complex cystic lesion in the left kidney measuring 11 mm, partially collapsed when compared to 2017. On reformats there are high-density septae which are likely enhancing, features of a Bosniak category 3 lesion. Renal protocol MRI would provide the most definitive characterization. Recommend urology follow-up. 3. Hepatic steatosis. Electronically Signed   By: Monte Fantasia M.D.   On: 02/16/2017 12:29    Procedures Procedures (including critical care time)  EMERGENCY DEPARTMENT  US GUIDANCE EXAM Emergency Ultrasound:  US Guidance for Needle Guidance  INDICATIONS: Difficult vascular access Linear probe used in real-time to visualize location of needle entry through skin.   PERFORMED BY: Myself IMAGES ARCHIVED?: No LIMITATIONS: Pain VIEWS USED: Transverse INTERPRETATION: Right arm, 20gauge  needle   Medications Ordered in ED Medications  iopamidol (ISOVUE-300) 61 % injection (not administered)  sodium chloride 0.9 % injection (not administered)  morphine 2 MG/ML injection 4 mg (4 mg Intravenous Given 02/16/17 1038)  ondansetron (ZOFRAN) injection 4 mg (4 mg Intravenous Given 02/16/17 1032)  sodium chloride 0.9 % bolus 1,000 mL (0 mLs Intravenous Stopped 02/16/17 1221)  iopamidol (ISOVUE-300) 61 % injection 100 mL (100 mLs Intravenous Contrast Given 02/16/17 1200)     Initial Impression / Assessment and Plan / ED Course  I have reviewed the triage vital signs and the nursing notes.  Pertinent labs & imaging results that were available during my care of the patient were reviewed by me and considered in my medical decision making (see chart for details).     BP (!) 147/70   Pulse 77   Temp 98.1 F (36.7 C) (Oral)   Resp 20   SpO2 96%    Final Clinical Impressions(s) / ED Diagnoses   Final diagnoses:  Diarrhea, unspecified type    New Prescriptions New Prescriptions   LOPERAMIDE (IMODIUM) 2 MG CAPSULE    Take 1 capsule (2 mg total) by mouth 4 (four) times daily as needed for diarrhea or loose stools.   8:13 AM Patient here with persistent diarrhea for more than a week. She also report having decrease in appetite, has occasional vomiting having abdominal pain. She does have some mild abdominal tenderness on exam. Will check labs, may consider abdominal pelvic CT scan for further evaluation. IV fluid, and pain medication given.  9:53 AM Pt has difficult IV access.  US guided IV performed by me.  20g needle to R antecubital.    1:39 PM Fecal occult blood test is negative, normal WBC, H&H, Panel are reassuring, normal lipase,urine without signs of urine tract infection. An abdominal and pelvic CT scan obtained showing no acute finding to explain patient's pain. She does have a complex cystic lesion to the left kidney measuring 11 mm. Recommend urology follow-up.     1:41 PM Patient felt reassured with the finding. She agrees to follow-up as appropriate. Patient discharged home with Imodium to help with her diarrhea. Return precaution discussed.     Domenic Moras, PA-C 02/16/17 1501    Fatima Blank, MD 02/17/17 [phone]

## 2017-02-16 NOTE — Discharge Instructions (Signed)
Please take Imodium as needed for your diarrhea. You have a cyst in the left kidney.  Follow up with urology for further evaluation.  Return if you have any concerns.

## 2017-02-16 NOTE — ED Notes (Signed)
This nurse attempted PIV placement x3 unsuccessfully. IV team ordered.

## 2017-02-16 NOTE — ED Notes (Signed)
EDPA attempted PIV placement with the ultrasound, but PIV blew when this nurse attempted to draw blood off of the line. USIV team at bedside at this time attempting PIV.

## 2017-02-16 NOTE — ED Notes (Signed)
Patient returned from CT

## 2017-02-16 NOTE — ED Triage Notes (Signed)
Pt complains of diarrhea for one week, she states that it's causing her hip to hurt and she feels like she has a fever sometimes

## 2017-02-16 NOTE — ED Notes (Signed)
POC occult blood = Negative

## 2017-03-21 ENCOUNTER — Other Ambulatory Visit: Payer: Self-pay | Admitting: Gastroenterology

## 2017-03-22 ENCOUNTER — Other Ambulatory Visit: Payer: Self-pay | Admitting: Gastroenterology

## 2017-03-22 ENCOUNTER — Encounter (HOSPITAL_COMMUNITY): Payer: Self-pay | Admitting: *Deleted

## 2017-03-23 ENCOUNTER — Ambulatory Visit (HOSPITAL_COMMUNITY): Payer: Self-pay | Admitting: Registered Nurse

## 2017-03-23 ENCOUNTER — Encounter (HOSPITAL_COMMUNITY)
Admission: RE | Disposition: A | Discharge status: Home / Self Care | Payer: Self-pay | Source: Ambulatory Visit | Attending: Gastroenterology

## 2017-03-23 ENCOUNTER — Ambulatory Visit (HOSPITAL_COMMUNITY)
Admission: RE | Admit: 2017-03-23 | Discharge: 2017-03-23 | Disposition: A | Discharge status: Home / Self Care | Payer: Self-pay | Source: Ambulatory Visit | Attending: Gastroenterology | Admitting: Gastroenterology

## 2017-03-23 DIAGNOSIS — I2510 Atherosclerotic heart disease of native coronary artery without angina pectoris: Secondary | ICD-10-CM | POA: Insufficient documentation

## 2017-03-23 DIAGNOSIS — R1012 Left upper quadrant pain: Secondary | ICD-10-CM | POA: Insufficient documentation

## 2017-03-23 DIAGNOSIS — I10 Essential (primary) hypertension: Secondary | ICD-10-CM | POA: Insufficient documentation

## 2017-03-23 DIAGNOSIS — D122 Benign neoplasm of ascending colon: Secondary | ICD-10-CM | POA: Insufficient documentation

## 2017-03-23 DIAGNOSIS — Z880 Allergy status to penicillin: Secondary | ICD-10-CM | POA: Insufficient documentation

## 2017-03-23 DIAGNOSIS — Z79899 Other long term (current) drug therapy: Secondary | ICD-10-CM | POA: Insufficient documentation

## 2017-03-23 DIAGNOSIS — J45909 Unspecified asthma, uncomplicated: Secondary | ICD-10-CM | POA: Insufficient documentation

## 2017-03-23 DIAGNOSIS — Z85038 Personal history of other malignant neoplasm of large intestine: Secondary | ICD-10-CM | POA: Insufficient documentation

## 2017-03-23 DIAGNOSIS — Z881 Allergy status to other antibiotic agents status: Secondary | ICD-10-CM | POA: Insufficient documentation

## 2017-03-23 DIAGNOSIS — K219 Gastro-esophageal reflux disease without esophagitis: Secondary | ICD-10-CM | POA: Insufficient documentation

## 2017-03-23 DIAGNOSIS — M1990 Unspecified osteoarthritis, unspecified site: Secondary | ICD-10-CM | POA: Insufficient documentation

## 2017-03-23 DIAGNOSIS — Z886 Allergy status to analgesic agent status: Secondary | ICD-10-CM | POA: Insufficient documentation

## 2017-03-23 DIAGNOSIS — R1032 Left lower quadrant pain: Secondary | ICD-10-CM | POA: Insufficient documentation

## 2017-03-23 DIAGNOSIS — Z888 Allergy status to other drugs, medicaments and biological substances status: Secondary | ICD-10-CM | POA: Insufficient documentation

## 2017-03-23 DIAGNOSIS — K644 Residual hemorrhoidal skin tags: Secondary | ICD-10-CM | POA: Insufficient documentation

## 2017-03-23 DIAGNOSIS — R197 Diarrhea, unspecified: Secondary | ICD-10-CM | POA: Insufficient documentation

## 2017-03-23 DIAGNOSIS — Z91030 Bee allergy status: Secondary | ICD-10-CM | POA: Insufficient documentation

## 2017-03-23 HISTORY — DX: Headache: R51

## 2017-03-23 HISTORY — DX: Dyspnea, unspecified: R06.00

## 2017-03-23 HISTORY — DX: Gastro-esophageal reflux disease without esophagitis: K21.9

## 2017-03-23 HISTORY — DX: Chronic kidney disease, unspecified: N18.9

## 2017-03-23 HISTORY — DX: Diarrhea, unspecified: R19.7

## 2017-03-23 HISTORY — PX: COLONOSCOPY WITH PROPOFOL: SHX5780

## 2017-03-23 HISTORY — DX: Headache, unspecified: R51.9

## 2017-03-23 HISTORY — DX: Essential (primary) hypertension: I10

## 2017-03-23 HISTORY — DX: Unspecified cirrhosis of liver: K74.60

## 2017-03-23 SURGERY — COLONOSCOPY WITH PROPOFOL
Anesthesia: Monitor Anesthesia Care

## 2017-03-23 MED ORDER — PROPOFOL 500 MG/50ML IV EMUL
INTRAVENOUS | Status: DC | PRN
  Administered 2017-03-23: 300 ug/kg/min via INTRAVENOUS

## 2017-03-23 MED ORDER — LACTATED RINGERS IV SOLN
INTRAVENOUS | Status: DC | PRN
  Administered 2017-03-23: 09:00:00 via INTRAVENOUS

## 2017-03-23 MED ORDER — LACTATED RINGERS IV SOLN
INTRAVENOUS | Status: DC
  Administered 2017-03-23: 09:00:00 via INTRAVENOUS

## 2017-03-23 MED ORDER — SODIUM CHLORIDE 0.9 % IV SOLN: INTRAVENOUS | Status: DC

## 2017-03-23 MED ORDER — PROPOFOL 10 MG/ML IV BOLUS
INTRAVENOUS | Status: AC
  Filled 2017-03-23: qty 20

## 2017-03-23 SURGICAL SUPPLY — 21 items

## 2017-03-23 NOTE — Discharge Instructions (Signed)
Colonoscopy ° °Post procedure instructions: ° °Read the instructions outlined below and refer to this sheet in the next few weeks. These discharge instructions provide you with general information on caring for yourself after you leave the hospital. Your doctor may also give you specific instructions. While your treatment has been planned according to the most current medical practices available, unavoidable complications occasionally occur. If you have any problems or questions after discharge, call Dr. Akosua Constantine at [facility] ([phone]). ° °HOME CARE INSTRUCTIONS ° °ACTIVITY: °· You may resume your regular activity, but move at a slower pace for the next 24 hours.  °· Take frequent rest periods for the next 24 hours.  °· Walking will help get rid of the air and reduce the bloated feeling in your belly (abdomen).  °· No driving for 24 hours (because of the medicine (anesthesia) used during the test).  °· You may shower.  °· Do not sign any important legal documents or operate any machinery for 24 hours (because of the anesthesia used during the test).  °NUTRITION: °· Drink plenty of fluids.  °· You may resume your normal diet as instructed by your doctor.  °· Begin with a light meal and progress to your normal diet. Heavy or fried foods are harder to digest and may make you feel sick to your stomach (nauseated).  °· Avoid alcoholic beverages for 24 hours or as instructed.  °MEDICATIONS: °· You may resume your normal medications unless your doctor tells you otherwise.  °WHAT TO EXPECT TODAY: °· Some feelings of bloating in the abdomen.  °· Passage of more gas than usual.  °· Spotting of blood in your stool or on the toilet paper.  °IF YOU HAD POLYPS REMOVED DURING THE COLONOSCOPY: °· No aspirin products for 7 days or as instructed.  °· No alcohol for 7 days or as instructed.  °· Eat a soft diet for the next 24 hours.  ° °FINDING OUT THE RESULTS OF YOUR TEST ° °Not all test results are available during your  visit. If your test results are not back during the visit, make an appointment with your caregiver to find out the results. Do not assume everything is normal if you have not heard from your caregiver or the medical facility. It is important for you to follow up on all of your test results.  ° ° ° °SEEK IMMEDIATE MEDICAL CARE IF: ° °· You have more than a spotting of blood in your stool.  °· Your belly is swollen (abdominal distention).  °· You are nauseated or vomiting.  °· You have a fever.  °· You have abdominal pain or discomfort that is severe or gets worse throughout the day.  ° ° °Document Released: 02/03/2004 Document Revised: 03/03/2011 Document Reviewed: 02/01/2008 °[employer]® Patient Information ©2012 [employer]. ° °

## 2017-03-23 NOTE — Anesthesia Procedure Notes (Signed)
Performed by: Lissa Morales Pre-anesthesia Checklist: Patient identified, Emergency Drugs available, Suction available and Patient being monitored Patient Re-evaluated:Patient Re-evaluated prior to induction Placement Confirmation: positive ETCO2 Dental Injury: Teeth and Oropharynx as per pre-operative assessment

## 2017-03-23 NOTE — Anesthesia Preprocedure Evaluation (Addendum)
Anesthesia Evaluation  Patient identified by MRN, date of birth, ID band Patient awake    Reviewed: Allergy & Precautions, NPO status , Patient's Chart, lab work & pertinent test results  Airway Mallampati: II  TM Distance: >3 FB Neck ROM: Full    Dental no notable dental hx.    Pulmonary shortness of breath, asthma ,    breath sounds clear to auscultation       Cardiovascular hypertension, + CAD   Rhythm:Regular Rate:Normal     Neuro/Psych    GI/Hepatic GERD  ,  Endo/Other    Renal/GU Renal InsufficiencyRenal disease     Musculoskeletal  (+) Arthritis ,   Abdominal   Peds  Hematology   Anesthesia Other Findings   Reproductive/Obstetrics                            Anesthesia Physical Anesthesia Plan  ASA: II  Anesthesia Plan: MAC   Post-op Pain Management:    Induction: Intravenous  PONV Risk Score and Plan: 2 and Ondansetron and Dexamethasone  Airway Management Planned: Natural Airway  Additional Equipment:   Intra-op Plan:   Post-operative Plan:   Informed Consent: I have reviewed the patients History and Physical, chart, labs and discussed the procedure including the risks, benefits and alternatives for the proposed anesthesia with the patient or authorized representative who has indicated his/her understanding and acceptance.     Plan Discussed with: CRNA  Anesthesia Plan Comments:        Anesthesia Quick Evaluation

## 2017-03-23 NOTE — Transfer of Care (Signed)
Immediate Anesthesia Transfer of Care Note  Patient: Kendra Gilbert  Procedure(s) Performed: Procedure(s): COLONOSCOPY WITH PROPOFOL (N/A)  Patient Location: PACU  Anesthesia Type:MAC  Level of Consciousness: awake, alert , oriented and patient cooperative  Airway & Oxygen Therapy: Patient Spontanous Breathing and Patient connected to face mask oxygen  Post-op Assessment: Report given to RN, Post -op Vital signs reviewed and stable and Patient moving all extremities X 4  Post vital signs: stable  Last Vitals:  Vitals:   03/23/17 0824 03/23/17 1030  BP: (!) 164/81   Pulse: 76 75  Resp: (!) 21 (!) 22  Temp: 36.6 C (!) 36.4 C  SpO2: 99% 100%    Last Pain:  Vitals:   03/23/17 1030  TempSrc: Oral  PainSc:          Complications: No apparent anesthesia complications

## 2017-03-23 NOTE — H&P (Signed)
Patient interval history reviewed.  Patient examined again.  There has been no change from documented H/P dated 03/21/17 (scanned into chart from our office) except as documented above.  Assessment:  1.  Diarrhea. 2.  Chronic left-sided abdominal pain. 3.  History of cancerous sigmoid colon polyp, resected.  Plan:  1.  Colonoscopy. 2.  Risks (bleeding, infection, bowel perforation that could require surgery, sedation-related changes in cardiopulmonary systems), benefits (identification and possible treatment of source of symptoms, exclusion of certain causes of symptoms), and alternatives (watchful waiting, radiographic imaging studies, empiric medical treatment) of colonoscopy were explained to patient/family in detail and patient wishes to proceed.

## 2017-03-23 NOTE — Anesthesia Postprocedure Evaluation (Signed)
Anesthesia Post Note  Patient: Marrian Salvage  Procedure(s) Performed: Procedure(s) (LRB): COLONOSCOPY WITH PROPOFOL (N/A)     Patient location during evaluation: Endoscopy Anesthesia Type: MAC Level of consciousness: awake and alert Pain management: pain level controlled Vital Signs Assessment: post-procedure vital signs reviewed and stable Respiratory status: spontaneous breathing, nonlabored ventilation, respiratory function stable and patient connected to nasal cannula oxygen Cardiovascular status: stable and blood pressure returned to baseline Postop Assessment: no apparent nausea or vomiting Anesthetic complications: no    Last Vitals:  Vitals:   03/23/17 1050 03/23/17 1100  BP: 133/73 (!) 148/68  Pulse: 63 76  Resp: 18 17  Temp:    SpO2: 99% 98%    Last Pain:  Vitals:   03/23/17 1040  TempSrc: Oral  PainSc:                  Emiko Osorto,JAMES TERRILL

## 2017-03-23 NOTE — Op Note (Signed)
[facility] Patient Name: Kendra Gilbert Procedure Date: 03/23/2017 MRN: [id number] Attending MD: Arta Silence , MD Date of Birth: 1955/10/24 CSN: [id number] Age: 61 Admit Type: Outpatient Procedure:                Colonoscopy Indications:              Last colonoscopy: June 2017, Abdominal pain in the                            left lower quadrant, Abdominal pain in the left                            upper quadrant, Clinically significant diarrhea of                            unexplained origin, Personal history of malignant                            neoplasm of the colon (cancerous colonic polyp, low                            grade, negative margin, no lymphovascular invasion) Providers:                Arta Silence, MD, Cleda Daub, RN, Elspeth Cho Tech., Technician, Enrigue Catena, CRNA Referring MD:              Medicines:                Monitored Anesthesia Care Complications:            No immediate complications. Estimated Blood Loss:     Estimated blood loss: none. Estimated blood loss:                            none. Procedure:                Pre-Anesthesia Assessment:                           - Prior to the procedure, a History and Physical                            was performed, and patient medications and                            allergies were reviewed. The patient's tolerance of                            previous anesthesia was also reviewed. The risks                            and benefits of the procedure and the sedation  options and risks were discussed with the patient.                            All questions were answered, and informed consent                            was obtained. Prior Anticoagulants: The patient has                            taken no previous anticoagulant or antiplatelet                            agents. ASA Grade Assessment: II - A patient with                         mild systemic disease. After reviewing the risks                            and benefits, the patient was deemed in                            satisfactory condition to undergo the procedure.                           After obtaining informed consent, the colonoscope                            was passed under direct vision. Throughout the                            procedure, the patient's blood pressure, pulse, and                            oxygen saturations were monitored continuously. The                            EC-[id number] ([id number]) scope was introduced through                            the anus and advanced to the the cecum, identified                            by appendiceal orifice and ileocecal valve. The                            ileocecal valve, appendiceal orifice, and rectum                            were photographed. The entire colon was examined.                            The colonoscopy was performed without difficulty.  The patient tolerated the procedure well. The                            quality of the bowel preparation was good. Scope In: 9:57:26 AM Scope Out: 10:18:13 AM Scope Withdrawal Time: 0 hours 14 minutes 40 seconds  Total Procedure Duration: 0 hours 20 minutes 47 seconds  Findings:      The perianal exam findings include non-thrombosed external hemorrhoids.      A 4 mm polyp was found in the ascending colon. The polyp was sessile.       The polyp was removed with a cold biopsy forceps. Resection and       retrieval were complete.      The colon (entire examined portion) appeared normal. Biopsies for       histology were taken with a cold forceps from the entire colon for       evaluation of microscopic colitis.      Colon otherwise normal; no other polyps, masses, vascular ectasias, or       inflammatory changes were seen.      The retroflexed view of the distal rectum and anal verge was normal and        showed no anal or rectal abnormalities. Impression:               - Non-thrombosed external hemorrhoids found on                            perianal exam.                           - One 4 mm polyp in the ascending colon, removed                            with a cold biopsy forceps. Resected and retrieved.                           - The entire examined colon is normal. Biopsied.                           - The distal rectum and anal verge are normal on                            retroflexion view. Moderate Sedation:      None Recommendation:           - Patient has a contact number available for                            emergencies. The signs and symptoms of potential                            delayed complications were discussed with the                            patient. Return to normal activities tomorrow.  Written discharge instructions were provided to the                            patient.                           - Discharge patient to home (ambulatory).                           - High fiber diet indefinitely.                           - Topical therapies (e.g., Preparation-H),                            avoidance of constipation/straining, liberal water                            intake, for management of hemorrhoids.                           - Await pathology results.                           - Repeat colonoscopy in 3 years for surveillance                            based on pathology results.                           - Return to GI clinic after studies are complete.                           - Return to referring physician as previously                            scheduled.                           - Continue present medications. Procedure Code(s):        --- Professional ---                           [id number], Colonoscopy, flexible; with biopsy, single                            or multiple Diagnosis Code(s):        --- Professional  ---                           D12.2, Benign neoplasm of ascending colon                           K64.4, Residual hemorrhoidal skin tags                           R10.32, Left lower quadrant pain  R10.12, Left upper quadrant pain                           R19.7, Diarrhea, unspecified                           Z85.038, Personal history of other malignant                            neoplasm of large intestine CPT copyright 2016 [employer]. All rights reserved. The codes documented in this report are preliminary and upon coder review may  be revised to meet current compliance requirements. Arta Silence, MD 03/23/2017 10:32:37 AM This report has been signed electronically. Number of Addenda: 0

## 2017-03-24 ENCOUNTER — Encounter (HOSPITAL_COMMUNITY): Payer: Self-pay | Admitting: Gastroenterology

## 2017-06-02 ENCOUNTER — Encounter (HOSPITAL_COMMUNITY): Payer: Self-pay | Admitting: Nurse Practitioner

## 2017-06-02 ENCOUNTER — Emergency Department (HOSPITAL_COMMUNITY)
Admission: EM | Admit: 2017-06-02 | Discharge: 2017-06-02 | Disposition: A | Discharge status: Home / Self Care | Payer: Self-pay | Attending: Emergency Medicine | Admitting: Emergency Medicine

## 2017-06-02 ENCOUNTER — Other Ambulatory Visit: Payer: Self-pay

## 2017-06-02 DIAGNOSIS — R1013 Epigastric pain: Secondary | ICD-10-CM | POA: Insufficient documentation

## 2017-06-02 DIAGNOSIS — I129 Hypertensive chronic kidney disease with stage 1 through stage 4 chronic kidney disease, or unspecified chronic kidney disease: Secondary | ICD-10-CM | POA: Insufficient documentation

## 2017-06-02 DIAGNOSIS — R1012 Left upper quadrant pain: Secondary | ICD-10-CM | POA: Insufficient documentation

## 2017-06-02 DIAGNOSIS — R197 Diarrhea, unspecified: Secondary | ICD-10-CM | POA: Insufficient documentation

## 2017-06-02 DIAGNOSIS — J45909 Unspecified asthma, uncomplicated: Secondary | ICD-10-CM | POA: Insufficient documentation

## 2017-06-02 DIAGNOSIS — N3001 Acute cystitis with hematuria: Secondary | ICD-10-CM | POA: Insufficient documentation

## 2017-06-02 DIAGNOSIS — Z79899 Other long term (current) drug therapy: Secondary | ICD-10-CM | POA: Insufficient documentation

## 2017-06-02 DIAGNOSIS — N189 Chronic kidney disease, unspecified: Secondary | ICD-10-CM | POA: Insufficient documentation

## 2017-06-02 DIAGNOSIS — R110 Nausea: Secondary | ICD-10-CM | POA: Insufficient documentation

## 2017-06-02 DIAGNOSIS — R1032 Left lower quadrant pain: Secondary | ICD-10-CM | POA: Insufficient documentation

## 2017-06-02 DIAGNOSIS — Z85038 Personal history of other malignant neoplasm of large intestine: Secondary | ICD-10-CM | POA: Insufficient documentation

## 2017-06-02 DIAGNOSIS — K5900 Constipation, unspecified: Secondary | ICD-10-CM | POA: Insufficient documentation

## 2017-06-02 LAB — COMPREHENSIVE METABOLIC PANEL
ALBUMIN: 4 g/dL (ref 3.5–5.0)
ALT: 43 U/L (ref 14–54)
AST: 42 U/L — AB (ref 15–41)
Alkaline Phosphatase: 111 U/L (ref 38–126)
Anion gap: 9 (ref 5–15)
BUN: 16 mg/dL (ref 6–20)
CHLORIDE: 107 mmol/L (ref 101–111)
CO2: 24 mmol/L (ref 22–32)
CREATININE: 0.71 mg/dL (ref 0.44–1.00)
Calcium: 9.1 mg/dL (ref 8.9–10.3)
GFR calc non Af Amer: >60 mL/min (ref >60)
Glucose, Bld: 91 mg/dL (ref 65–99)
Potassium: 3.4 mmol/L — ABNORMAL LOW (ref 3.5–5.1)
Sodium: 140 mmol/L (ref 135–145)
Total Bilirubin: 0.7 mg/dL (ref 0.3–1.2)
Total Protein: 7.9 g/dL (ref 6.5–8.1)

## 2017-06-02 LAB — URINALYSIS, ROUTINE W REFLEX MICROSCOPIC
Bilirubin Urine: NEGATIVE
GLUCOSE, UA: NEGATIVE mg/dL
Ketones, ur: NEGATIVE mg/dL
Nitrite: NEGATIVE
PH: 5.5 (ref 5.0–8.0)
PROTEIN: NEGATIVE mg/dL
Specific Gravity, Urine: >1.03 — ABNORMAL HIGH (ref 1.005–1.030)

## 2017-06-02 LAB — BRAIN NATRIURETIC PEPTIDE: B Natriuretic Peptide: 36.8 pg/mL (ref 0.0–100.0)

## 2017-06-02 LAB — CBC
HCT: 42.3 % (ref 36.0–46.0)
Hemoglobin: 14.2 g/dL (ref 12.0–15.0)
MCH: 28.8 pg (ref 26.0–34.0)
MCHC: 33.6 g/dL (ref 30.0–36.0)
MCV: 85.8 fL (ref 78.0–100.0)
PLATELETS: 171 10*3/uL (ref 150–400)
RBC: 4.93 MIL/uL (ref 3.87–5.11)
RDW: 13.1 % (ref 11.5–15.5)
WBC: 6.8 10*3/uL (ref 4.0–10.5)

## 2017-06-02 LAB — I-STAT TROPONIN, ED: Troponin i, poc: 0.02 ng/mL (ref 0.00–0.08)

## 2017-06-02 LAB — LIPASE, BLOOD: Lipase: 23 U/L (ref 11–51)

## 2017-06-02 LAB — URINALYSIS, MICROSCOPIC (REFLEX)

## 2017-06-02 MED ORDER — ONDANSETRON HCL 4 MG/2ML IJ SOLN
4.0000 mg | Freq: Once | INTRAMUSCULAR | Status: AC
  Administered 2017-06-02: 4 mg via INTRAVENOUS
  Filled 2017-06-02: qty 2

## 2017-06-02 MED ORDER — NITROFURANTOIN MONOHYD MACRO 100 MG PO CAPS: 100.0000 mg | ORAL_CAPSULE | Freq: Two times a day (BID) | ORAL | 0 refills | Status: DC

## 2017-06-02 MED ORDER — GI COCKTAIL ~~LOC~~
30.0000 mL | Freq: Once | ORAL | Status: AC
  Administered 2017-06-02: 30 mL via ORAL
  Filled 2017-06-02: qty 30

## 2017-06-02 MED ORDER — MORPHINE SULFATE (PF) 4 MG/ML IV SOLN
4.0000 mg | Freq: Once | INTRAVENOUS | Status: AC
  Administered 2017-06-02: 4 mg via INTRAVENOUS
  Filled 2017-06-02: qty 1

## 2017-06-02 NOTE — ED Provider Notes (Signed)
[facility] DEPT Provider Note   CSN: [id number] Arrival date & time: 06/02/17  3710     History   Chief Complaint Chief Complaint  Patient presents with  . Abdominal Pain    HPI Kendra Gilbert is a 61 y.o. female ho obesity, IBS, hypertension, hyperlipidemia, GERD presents to ED for evaluation of persistent, intermittent left upper quadrant/epigastric abdominal pain that radiates to left lower quadrant. Associated symptoms include indigestion, nausea, alternating constipation and diarrhea. Symptoms have been ongoing since August. She has noticed some abdominal swelling worsening over the last 2 weeks which brought her to the emergency department today. She was evaluated in the emergency department for similar symptoms in August with benign workup and CT scan. She had follow-up colonoscopy in September which was normal. Her gastroenterologist is treating her acid reflux with a PPI and Pepcid.  Epigastric abdominal pain is nonexertional. No shortness of breath, palpitations or dizziness. She denies fevers, chills, vomiting, melena, hematochezia, abnormal vaginal bleeding. Reports occasional urinary frequency but no dysuria. No previous history of PUD, diverticulitis, pancreatitis. Denies heavy EtOH or NSAID use. No h/o CAD or CHF.   HPI  Past Medical History:  Diagnosis Date  . Asthma   . Chronic kidney disease    cyst in left kidney  . Colon cancer ([place]) 2017   surgical tx  removal of polyp  . Coronary artery calcification    a. POET in 2015 with no acute findings.  . Diarrhea since 02-16-17   c dif test normal per pt  . Dyspnea     with exertion, occ at rest no oxygen or inhaler use  . GERD (gastroesophageal reflux disease)   . H/O: rheumatic fever as child  . Headache    occ left side migraine  . Hypertension   . Lung disease    Lung nodule  . 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 Pneumonia 2016  . Rheumatoid  arthritis ([place])    oa also, back arthritis, hx rheumatoid     Patient Active Problem List   Diagnosis Date Noted  . Chest pain 02/17/2016  . Dyslipidemia 02/17/2016  . Family history of heart disease   . Dyspnea 01/20/2016  . Multiple pulmonary nodules determined by computed tomography of lung 01/20/2016  . Coronary artery calcification 08/23/2013  . Upper airway cough syndrome 08/19/2013  . Exertional chest pain 08/19/2013    Past Surgical History:  Procedure Laterality Date  . ABDOMINAL HYSTERECTOMY     ovaries left  . COLONOSCOPY WITH PROPOFOL N/A 03/23/2017   Procedure: COLONOSCOPY WITH PROPOFOL;  Surgeon: Arta Silence, MD;  Location: [facility] END[facility]COPY;  Service: Endoscopy;  Laterality: N/A;  . colonscopy  2017  . DILATION AND CURETTAGE OF UTERUS     few done for endometriosis    OB History    No data available       Home Medications    Prior to Admission medications   Medication Sig Start Date End Date Taking? Authorizing Provider  acetaminophen (TYLENOL) 500 MG tablet Take 500 mg by mouth every 6 (six) hours as needed for moderate pain.   Yes [provider]  albuterol (PROVENTIL HFA;VENTOLIN HFA) 108 (90 Base) MCG/ACT inhaler Inhale 2 puffs into the lungs every 6 (six) hours as needed for wheezing or shortness of breath.   Yes [provider]  chlorpheniramine (CHLOR-TRIMETON) 4 MG tablet Take 4 mg by mouth every 4 (four) hours as needed for allergies.  Yes [provider]  famotidine (PEPCID) 20 MG tablet Take 20 mg by mouth daily before supper.   Yes [provider]  loperamide (IMODIUM) 2 MG capsule Take 2 mg by mouth 4 (four) times daily as needed for diarrhea or loose stools.   Yes [provider]  montelukast (SINGULAIR) 10 MG tablet Take 10 mg by mouth at bedtime.   Yes [provider]  potassium chloride SA (K-DUR,KLOR-CON) 20 MEQ tablet Take 20 mEq by mouth 2 (two) times daily.   Yes [provider]  tetrahydrozoline (VISINE) 0.05 % ophthalmic solution Place 2 drops into both eyes 3 (three) times daily as needed (dry eyes.).   Yes [provider]  triamcinolone ointment (KENALOG) 0.1 % Apply 1 application topically 2 (two) times daily as needed (insect bites and skin rash).    Yes [provider]  amLODipine (NORVASC) 5 MG tablet Take 5 mg by mouth daily.    [provider]  nitrofurantoin, macrocrystal-monohydrate, (MACROBID) 100 MG capsule Take 1 capsule (100 mg total) by mouth 2 (two) times daily. 06/02/17   Kinnie Feil, PA-C  pantoprazole (PROTONIX) 40 MG tablet Take 40 mg by mouth daily.    [provider]  rosuvastatin (CRESTOR) 20 MG tablet Take 20 mg by mouth daily.    [provider]    Family History Family History  Problem Relation Age of Onset  . CAD Mother 68       CABG/stents  . Breast cancer Mother   . Asthma Father   . CAD Father 73  . Allergies Father   . CAD Brother 83       Died with MI  . CAD Brother 3       9 stents  . CAD Brother 63       7 stents    Social History Social History   Tobacco Use  . Smoking status: Never Smoker  . Smokeless tobacco: Never Used  Substance Use Topics  . Alcohol use: No  . Drug use: No     Allergies   Bee venom; Other; Aspirin; Erythromycin; and Penicillins   Review of Systems Review of Systems  Gastrointestinal: Positive for abdominal pain, constipation, diarrhea and nausea.       +bloating +indigestion      Physical Exam Updated Vital Signs BP 129/72   Pulse (!) 55   Temp 97.7 F (36.5 C) (Oral)   Resp 15   Ht 4\' 11"  (1.499 m)   Wt 72.6 kg (160 lb)   SpO2 97%   BMI 32.32 kg/m   Physical Exam  Constitutional: She is oriented to person, place, and time. She appears well-developed and well-nourished. No distress.  NAD.  HENT:  Head: Normocephalic and atraumatic.  Right Ear: External ear normal.  Left Ear: External ear normal.  Nose: Nose  normal.  Eyes: Conjunctivae and EOM are normal. No scleral icterus.  Neck: Normal range of motion. Neck supple.  Cardiovascular: Normal rate, regular rhythm and normal heart sounds.  No murmur heard. Pulmonary/Chest: Effort normal and breath sounds normal. She has no wheezes.  Tachycardic  Abdominal: Soft. Normal appearance and bowel sounds are normal. There is tenderness in the epigastric area, suprapubic area, left upper quadrant and left lower quadrant.  Soft. ND. No peritoneal signs. No G/R/R.  No CVAT Negative murphy's and mcburney's  Musculoskeletal: Normal range of motion. She exhibits no deformity.  Neurological: She is alert and oriented to person, place, and time.  Skin: Skin is warm and dry. Capillary refill takes less than 2 seconds.  Psychiatric: She has a normal mood and affect. Her behavior is normal. Judgment and thought content normal.  Nursing note and vitals reviewed.    ED Treatments / Results  Labs (all labs ordered are listed, but only abnormal results are displayed) Labs Reviewed  COMPREHENSIVE METABOLIC PANEL - Abnormal; Notable for the following components:      Result Value   Potassium 3.4 (*)    AST 42 (*)    All other components within normal limits  URINALYSIS, ROUTINE W REFLEX MICROSCOPIC - Abnormal; Notable for the following components:   APPearance HAZY (*)    Specific Gravity, Urine >1.030 (*)    Hgb urine dipstick TRACE (*)    Leukocytes, UA MODERATE (*)    All other components within normal limits  URINALYSIS, MICROSCOPIC (REFLEX) - Abnormal; Notable for the following components:   Bacteria, UA FEW (*)    Squamous Epithelial / LPF 6-30 (*)    All other components within normal limits  LIPASE, BLOOD  CBC  BRAIN NATRIURETIC PEPTIDE  I-STAT TROPONIN, ED  I-STAT TROPONIN, ED    EKG  EKG Interpretation  Date/Time:  Thursday June 02 2017 07:18:50 EST Ventricular Rate:  68 PR Interval:    QRS Duration: 92 QT Interval:  410 QTC  Calculation: 436 R Axis:   35 Text Interpretation:  Sinus rhythm Abnormal R-wave progression, early transition since last tracing no significant change Confirmed by Daleen Bo [phone]) on 06/02/2017 7:39:13 AM       Radiology No results found.  Procedures Procedures (including critical care time)  Medications Ordered in ED Medications  gi cocktail (Maalox,Lidocaine,Donnatal) (30 mLs Oral Given 06/02/17 0749)  morphine 4 MG/ML injection 4 mg (4 mg Intravenous Given 06/02/17 0753)  ondansetron (ZOFRAN) injection 4 mg (4 mg Intravenous Given 06/02/17 0753)     Initial Impression / Assessment and Plan / ED Course  I have reviewed the triage vital signs and the nursing notes.  Pertinent labs & imaging results that were available during my care of the patient were reviewed by me and considered in my medical decision making (see chart for details).  Clinical Course as of Jun 02 956  Thu Jun 02, 2017  0801 Hgb urine dipstick: (!) TRACE [CG]  0801 Leukocytes, UA: (!) MODERATE [CG]  0801 Appearance: (!) HAZY [CG]    Clinical Course User Index [CG] Kinnie Feil, PA-C   61 year old female with history of IBS and GERD presents with left upper quadrant/epigastric abdominal pain associated with indigestion, nausea and abdominal distention, alternating constipation/diarrhea. Symptoms ongoing for several months. Was evaluated in the ED for same 3 months ago. Most recent colonoscopy 2 months ago was benign. No exertional chest pain or shortness of breath, vomiting, fevers, chills, melena or hematochezia. She is having urinary frequency.  On my exam, vital signs are within normal limits. She is nontoxic. She has mild tenderness to left upper quadrant and left lower quadrant without peritoneal signs. Mild suprapubic tenderness without CVA tenderness.  Lab work today shows urinary tract infection, otherwise normal. EKG, troponin unremarkable. Had a cardiac workup within the last year  which was benign. Doubt ACS in this patient.  Final Clinical Impressions(s) / ED Diagnoses   High suspicion for uncontrolled GERD versus PUD versus IBS. Symptoms will adequately controlled in the ED. No episodes of emesis or diarrhea. Vital signs remained stable. She is considered safe for discharge with GI follow-up  for further discussion of symptoms. May benefit from EGD. Discussed return precautions.  Final diagnoses:  Left upper quadrant pain  Acute cystitis with hematuria    ED Discharge Orders        Ordered    nitrofurantoin, macrocrystal-monohydrate, (MACROBID) 100 MG capsule  2 times daily     06/02/17 0929       Kinnie Feil, PA-C 06/02/17 2244    Daleen Bo, MD 06/02/17 1538

## 2017-06-02 NOTE — ED Triage Notes (Signed)
Patient reports she is having some LUQ/LLQ pain. Reports she has chronic pain and some kidney dysfunction. Reports she has intermittent diarrhea and constipation this has been going on for some time now. Denies fever or blood in stool. Denies any urinary symtpoms. Reports her GFR was elevated in the past and would like this drawn to see what it is now. Patient is ambulatory and speak in full sentences. Patient denies any OTC or supplemental treatments for the pain.

## 2017-06-02 NOTE — Discharge Instructions (Signed)
Your workup in the emergency department is normal. I suspect your pain may be from acid reflux, IBS or maybe an ulcer. Please follow-up with gastroenterology as soon as possible for further discussion of your symptoms. Avoid ibuprofen for pain, can take tylenol. Continue all medicines as prescribed. Return to the ED if you develop fevers, chills, vomiting, bloody diarrhea. You have a urinary tract infection, please take antibiotics as prescribed.

## 2017-08-03 ENCOUNTER — Other Ambulatory Visit (HOSPITAL_COMMUNITY): Payer: Self-pay | Admitting: Family Medicine

## 2017-08-03 DIAGNOSIS — R935 Abnormal findings on diagnostic imaging of other abdominal regions, including retroperitoneum: Secondary | ICD-10-CM

## 2017-08-08 ENCOUNTER — Ambulatory Visit (HOSPITAL_COMMUNITY)
Admission: RE | Admit: 2017-08-08 | Discharge: 2017-08-08 | Disposition: A | Discharge status: Home / Self Care | Payer: Self-pay | Source: Ambulatory Visit | Attending: Family Medicine | Admitting: Family Medicine

## 2017-08-08 DIAGNOSIS — R935 Abnormal findings on diagnostic imaging of other abdominal regions, including retroperitoneum: Secondary | ICD-10-CM | POA: Insufficient documentation

## 2017-08-08 DIAGNOSIS — Q6101 Congenital single renal cyst: Secondary | ICD-10-CM | POA: Insufficient documentation

## 2017-08-08 MED ORDER — GADOBENATE DIMEGLUMINE 529 MG/ML IV SOLN
15.0000 mL | Freq: Once | INTRAVENOUS | Status: AC | PRN
  Administered 2017-08-08: 15 mL via INTRAVENOUS

## 2017-10-05 ENCOUNTER — Emergency Department (HOSPITAL_COMMUNITY): Payer: Self-pay

## 2017-10-05 ENCOUNTER — Emergency Department (HOSPITAL_COMMUNITY)
Admission: EM | Admit: 2017-10-05 | Discharge: 2017-10-05 | Disposition: A | Discharge status: Home / Self Care | Payer: Self-pay | Attending: Emergency Medicine | Admitting: Emergency Medicine

## 2017-10-05 ENCOUNTER — Encounter (HOSPITAL_COMMUNITY): Payer: Self-pay | Admitting: Emergency Medicine

## 2017-10-05 DIAGNOSIS — N189 Chronic kidney disease, unspecified: Secondary | ICD-10-CM | POA: Insufficient documentation

## 2017-10-05 DIAGNOSIS — R202 Paresthesia of skin: Secondary | ICD-10-CM | POA: Insufficient documentation

## 2017-10-05 DIAGNOSIS — Z79899 Other long term (current) drug therapy: Secondary | ICD-10-CM | POA: Insufficient documentation

## 2017-10-05 DIAGNOSIS — I129 Hypertensive chronic kidney disease with stage 1 through stage 4 chronic kidney disease, or unspecified chronic kidney disease: Secondary | ICD-10-CM | POA: Insufficient documentation

## 2017-10-05 DIAGNOSIS — Z85038 Personal history of other malignant neoplasm of large intestine: Secondary | ICD-10-CM | POA: Insufficient documentation

## 2017-10-05 LAB — BASIC METABOLIC PANEL
Anion gap: 11 (ref 5–15)
BUN: 15 mg/dL (ref 6–20)
CO2: 25 mmol/L (ref 22–32)
Calcium: 9.3 mg/dL (ref 8.9–10.3)
Chloride: 107 mmol/L (ref 101–111)
Creatinine, Ser: 0.66 mg/dL (ref 0.44–1.00)
GFR calc Af Amer: >60 mL/min (ref >60)
GFR calc non Af Amer: >60 mL/min (ref >60)
Glucose, Bld: 96 mg/dL (ref 65–99)
Potassium: 3.4 mmol/L — ABNORMAL LOW (ref 3.5–5.1)
Sodium: 143 mmol/L (ref 135–145)

## 2017-10-05 LAB — CBC WITH DIFFERENTIAL/PLATELET
Basophils Absolute: 0 10*3/uL (ref 0.0–0.1)
Basophils Relative: 0 %
Eosinophils Absolute: 0 10*3/uL (ref 0.0–0.7)
Eosinophils Relative: 1 %
HCT: 44.5 % (ref 36.0–46.0)
Hemoglobin: 14.3 g/dL (ref 12.0–15.0)
Lymphocytes Relative: 26 %
Lymphs Abs: 1.8 10*3/uL (ref 0.7–4.0)
MCH: 28 pg (ref 26.0–34.0)
MCHC: 32.1 g/dL (ref 30.0–36.0)
MCV: 87.3 fL (ref 78.0–100.0)
Monocytes Absolute: 0.4 10*3/uL (ref 0.1–1.0)
Monocytes Relative: 5 %
Neutro Abs: 4.7 10*3/uL (ref 1.7–7.7)
Neutrophils Relative %: 68 %
Platelets: 211 10*3/uL (ref 150–400)
RBC: 5.1 MIL/uL (ref 3.87–5.11)
RDW: 13.2 % (ref 11.5–15.5)
WBC: 6.9 10*3/uL (ref 4.0–10.5)

## 2017-10-05 NOTE — ED Notes (Signed)
Patient transported to MRI 

## 2017-10-05 NOTE — ED Triage Notes (Signed)
Patient here from home with complaints of upper back pain and numbness and tingling in bilateral hands and fingers since Sunday. Denies dizziness, chest pain. Denies difficulty ambulating.

## 2017-10-05 NOTE — Discharge Instructions (Signed)
The MRIs of your spine do not show significant disease which would explain your numbness. You basic labs are unremarkable. I want you to follow-up with your family doctor.

## 2017-10-05 NOTE — ED Notes (Signed)
Patient in MRI 

## 2017-10-07 NOTE — ED Provider Notes (Signed)
[place] DEPT Provider Note   CSN: [id number] Arrival date & time: 10/05/17  4098     History   Chief Complaint Chief Complaint  Patient presents with  . Numbness  . Back Pain    HPI Kendra Gilbert is a 62 y.o. female.  HPI   62 year old female with mid to upper back pain and numbness and tingling bilateral hand/fingers.  Denies any acute trauma.  Noted symptoms Sunday.  She feels like they have been progressing since then.  Back pain is worse with movements.  Denies any weakness.  No urinary complaints.  Denies any past history of back surgeries.  She is not anticoagulated.  Past Medical History:  Diagnosis Date  . Asthma   . Chronic kidney disease    cyst in left kidney  . Colon cancer ([place]) 2017   surgical tx  removal of polyp  . Coronary artery calcification    a. POET in 2015 with no acute findings.  . Diarrhea since 02-16-17   c dif test normal per pt  . Dyspnea     with exertion, occ at rest no oxygen or inhaler use  . GERD (gastroesophageal reflux disease)   . H/O: rheumatic fever as child  . Headache    occ left side migraine  . Hypertension   . Lung disease    Lung nodule  . 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 Pneumonia 2016  . Rheumatoid arthritis ([place])    oa also, back arthritis, hx rheumatoid     Patient Active Problem List   Diagnosis Date Noted  . Chest pain 02/17/2016  . Dyslipidemia 02/17/2016  . Family history of heart disease   . Dyspnea 01/20/2016  . Multiple pulmonary nodules determined by computed tomography of lung 01/20/2016  . Coronary artery calcification 08/23/2013  . Upper airway cough syndrome 08/19/2013  . Exertional chest pain 08/19/2013    Past Surgical History:  Procedure Laterality Date  . ABDOMINAL HYSTERECTOMY     ovaries left  . COLONOSCOPY WITH PROPOFOL N/A 03/23/2017   Procedure: COLONOSCOPY WITH PROPOFOL;  Surgeon: Arta Silence, MD;  Location: [facility]  ENDOSCOPY;  Service: Endoscopy;  Laterality: N/A;  . colonscopy  2017  . DILATION AND CURETTAGE OF UTERUS     few done for endometriosis     OB History   None      Home Medications    Prior to Admission medications   Medication Sig Start Date End Date Taking? Authorizing Provider  famotidine (PEPCID) 20 MG tablet Take 20 mg by mouth daily before supper.   Yes [provider]    Family History Family History  Problem Relation Age of Onset  . CAD Mother 64       CABG/stents  . Breast cancer Mother   . Asthma Father   . CAD Father 31  . Allergies Father   . CAD Brother 23       Died with MI  . CAD Brother 56       9 stents  . CAD Brother 6       7 stents    Social History Social History   Tobacco Use  . Smoking status: Never Smoker  . Smokeless tobacco: Never Used  Substance Use Topics  . Alcohol use: No  . Drug use: No     Allergies   Bee venom; Other; Aspirin; Erythromycin; and Penicillins   Review of Systems Review of Systems  All systems reviewed and negative, other than as noted in HPI.  Physical Exam Updated Vital Signs BP (!) 165/87   Pulse 77   Temp 97.8 F (36.6 C) (Oral)   Resp 16   SpO2 100%   Physical Exam  Constitutional: She is oriented to person, place, and time. She appears well-developed and well-nourished. No distress.  HENT:  Head: Normocephalic and atraumatic.  Eyes: Conjunctivae are normal. Right eye exhibits no discharge. Left eye exhibits no discharge.  Neck: Neck supple.  Cardiovascular: Normal rate, regular rhythm and normal heart sounds. Exam reveals no gallop and no friction rub.  No murmur heard. Pulmonary/Chest: Effort normal and breath sounds normal. No respiratory distress.  Abdominal: Soft. She exhibits no distension. There is no tenderness.  Musculoskeletal: She exhibits no edema or tenderness.  Back normal to inspection.  She points to approximately the T4/5 level paraspinally on the right as area of  maximal pain.  She does have mild reproducibility with palpation in this area.  No overlying skin changes.  Neurological: She is alert and oriented to person, place, and time. No cranial nerve deficit. She exhibits normal muscle tone.  Strength is 5 out of 5 bilateral upper and lower extremities.  She reports decreased sensation to light touch essentially in her entire hand and all fingers bilaterally with some decreased sensation light touch coming up the dorsal aspect of the forearm to about the mid forearm.  Extremities are grossly normal in appearance.  Skin: Skin is warm and dry.  Psychiatric: She has a normal mood and affect. Her behavior is normal. Thought content normal.  Nursing note and vitals reviewed.    ED Treatments / Results  Labs (all labs ordered are listed, but only abnormal results are displayed) Labs Reviewed  BASIC METABOLIC PANEL - Abnormal; Notable for the following components:      Result Value   Potassium 3.4 (*)    All other components within normal limits  CBC WITH DIFFERENTIAL/PLATELET    EKG None  Radiology Mr Cervical Spine Wo Contrast  Result Date: 10/05/2017 CLINICAL DATA:  Upper back pain with numbness and tingling in both hands since 10/02/2017. No known injury. EXAM: MRI CERVICAL AND THORACIC SPINE WITHOUT CONTRAST TECHNIQUE: Multiplanar and multiecho pulse sequences of the cervical spine, to include the craniocervical junction and cervicothoracic junction, and the thoracic spine, were obtained without intravenous contrast. COMPARISON:  None. FINDINGS: MRI CERVICAL SPINE FINDINGS Alignment: Maintained. Vertebrae: No fracture or worrisome lesion. Small hemangioma in T1 incidentally noted. Cord: Normal signal throughout. Posterior Fossa, vertebral arteries, paraspinal tissues: Negative. Disc levels: C2-3: Negative. C3-4: Negative. C4-5: Negative. C5-6: Shallow disc bulge without central canal or foraminal stenosis. C6-7: Minimal disc bulge without central  canal or foraminal stenosis. C7-T1: Negative. MRI THORACIC SPINE FINDINGS Alignment:  Maintained. Vertebrae: Height and signal are normal. Cord:  Normal signal throughout. Paraspinal and other soft tissues: Negative. Disc levels: T5-6: Tiny right paracentral protrusion without stenosis. T6-7: Broad-based left paracentral protrusion effaces the left side of the thecal sac and contacts the cord. The foramina are widely patent. T7-8 to T12-L1 are negative. IMPRESSION: Normal-appearing cervical and thoracic cord. Mild cervical spondylosis without stenosis. Broad-based left paracentral protrusion at T6-7 contacts and mildly deforms the ventral aspect of the left side of the cord. The central spinal canal and foramina are open at T6-7. The thoracic spine is otherwise negative. Electronically Signed   By: Inge Rise M.D.   On: 10/05/2017 12:11   Mr Thoracic Spine Wo  Contrast  Result Date: 10/05/2017 CLINICAL DATA:  Upper back pain with numbness and tingling in both hands since 10/02/2017. No known injury. EXAM: MRI CERVICAL AND THORACIC SPINE WITHOUT CONTRAST TECHNIQUE: Multiplanar and multiecho pulse sequences of the cervical spine, to include the craniocervical junction and cervicothoracic junction, and the thoracic spine, were obtained without intravenous contrast. COMPARISON:  None. FINDINGS: MRI CERVICAL SPINE FINDINGS Alignment: Maintained. Vertebrae: No fracture or worrisome lesion. Small hemangioma in T1 incidentally noted. Cord: Normal signal throughout. Posterior Fossa, vertebral arteries, paraspinal tissues: Negative. Disc levels: C2-3: Negative. C3-4: Negative. C4-5: Negative. C5-6: Shallow disc bulge without central canal or foraminal stenosis. C6-7: Minimal disc bulge without central canal or foraminal stenosis. C7-T1: Negative. MRI THORACIC SPINE FINDINGS Alignment:  Maintained. Vertebrae: Height and signal are normal. Cord:  Normal signal throughout. Paraspinal and other soft tissues: Negative.  Disc levels: T5-6: Tiny right paracentral protrusion without stenosis. T6-7: Broad-based left paracentral protrusion effaces the left side of the thecal sac and contacts the cord. The foramina are widely patent. T7-8 to T12-L1 are negative. IMPRESSION: Normal-appearing cervical and thoracic cord. Mild cervical spondylosis without stenosis. Broad-based left paracentral protrusion at T6-7 contacts and mildly deforms the ventral aspect of the left side of the cord. The central spinal canal and foramina are open at T6-7. The thoracic spine is otherwise negative. Electronically Signed   By: Inge Rise M.D.   On: 10/05/2017 12:11    Procedures Procedures (including critical care time)  Medications Ordered in ED Medications - No data to display   Initial Impression / Assessment and Plan / ED Course  I have reviewed the triage vital signs and the nursing notes.  Pertinent labs & imaging results that were available during my care of the patient were reviewed by me and considered in my medical decision making (see chart for details).     62 year old female with progressive numbness in bilateral upper extremities.  She denies any motor deficits.  MRIs as above.  Cannot clearly correlate these findings to her symptoms.  I doubt emergent process.  Follow-up with PCP for further evaluation for persistent symptoms.  Final Clinical Impressions(s) / ED Diagnoses   Final diagnoses:  Paresthesias    ED Discharge Orders    None       Virgel Manifold, MD 10/07/17 [phone]

## 2018-07-31 ENCOUNTER — Encounter: Payer: Self-pay | Admitting: Cardiology

## 2018-08-01 ENCOUNTER — Other Ambulatory Visit (HOSPITAL_COMMUNITY): Payer: Self-pay | Admitting: Urology

## 2018-08-01 DIAGNOSIS — D49512 Neoplasm of unspecified behavior of left kidney: Secondary | ICD-10-CM

## 2018-08-02 ENCOUNTER — Other Ambulatory Visit: Payer: Self-pay | Admitting: Family Medicine

## 2018-08-02 DIAGNOSIS — R911 Solitary pulmonary nodule: Secondary | ICD-10-CM

## 2018-08-03 ENCOUNTER — Encounter: Payer: Self-pay | Admitting: Cardiology

## 2018-08-09 ENCOUNTER — Encounter (HOSPITAL_COMMUNITY): Payer: Self-pay

## 2018-08-09 ENCOUNTER — Emergency Department (HOSPITAL_COMMUNITY): Payer: Medicaid Other

## 2018-08-09 ENCOUNTER — Other Ambulatory Visit: Payer: Medicaid Other

## 2018-08-09 ENCOUNTER — Emergency Department (HOSPITAL_COMMUNITY)
Admission: EM | Admit: 2018-08-09 | Discharge: 2018-08-09 | Disposition: A | Discharge status: Home / Self Care | Payer: Medicaid Other | Attending: Emergency Medicine | Admitting: Emergency Medicine

## 2018-08-09 DIAGNOSIS — B349 Viral infection, unspecified: Secondary | ICD-10-CM

## 2018-08-09 DIAGNOSIS — Z85038 Personal history of other malignant neoplasm of large intestine: Secondary | ICD-10-CM | POA: Diagnosis not present

## 2018-08-09 DIAGNOSIS — J45909 Unspecified asthma, uncomplicated: Secondary | ICD-10-CM | POA: Diagnosis not present

## 2018-08-09 DIAGNOSIS — N189 Chronic kidney disease, unspecified: Secondary | ICD-10-CM | POA: Insufficient documentation

## 2018-08-09 DIAGNOSIS — R05 Cough: Secondary | ICD-10-CM | POA: Diagnosis present

## 2018-08-09 DIAGNOSIS — I129 Hypertensive chronic kidney disease with stage 1 through stage 4 chronic kidney disease, or unspecified chronic kidney disease: Secondary | ICD-10-CM | POA: Diagnosis not present

## 2018-08-09 LAB — INFLUENZA PANEL BY PCR (TYPE A & B)
INFLBPCR: NEGATIVE
Influenza A By PCR: NEGATIVE

## 2018-08-09 MED ORDER — IBUPROFEN 200 MG PO TABS
600.0000 mg | ORAL_TABLET | Freq: Once | ORAL | Status: AC
Start: 2018-08-09 — End: 2018-08-09
  Administered 2018-08-09: 600 mg via ORAL
  Filled 2018-08-09: qty 3

## 2018-08-09 MED ORDER — ALBUTEROL SULFATE HFA 108 (90 BASE) MCG/ACT IN AERS
2.0000 | INHALATION_SPRAY | Freq: Once | RESPIRATORY_TRACT | Status: AC
  Administered 2018-08-09: 2 via RESPIRATORY_TRACT
  Filled 2018-08-09: qty 6.7

## 2018-08-09 NOTE — ED Provider Notes (Signed)
[place] DEPT Provider Note   CSN: [id number] Arrival date & time: 08/09/18  0614     History   Chief Complaint Chief Complaint  Patient presents with  . Flu like symptoms    HPI Kendra Gilbert is a 63 y.o. female.  HPI Patient is a 63 year old female presents the emergency department with cough sore throat chills and myalgias over the past 4 days.  She feels like her symptoms are progressing.  No shortness of breath.  No use of bronchodilators.  Has tried ibuprofen and Tylenol with some improvement.  Mild decreased oral intake.  Denies nausea and vomiting.  No diarrhea.  Denies abdominal pain.  No chest pain.  No new rash.  Denies headache.  No neck pain or stiffness.   Past Medical History:  Diagnosis Date  . Asthma   . Chronic kidney disease    cyst in left kidney  . Colon cancer ([place]) 2017   surgical tx  removal of polyp  . Coronary artery calcification    a. POET in 2015 with no acute findings.  . Diarrhea since 02-16-17   c dif test normal per pt  . Dyspnea     with exertion, occ at rest no oxygen or inhaler use  . GERD (gastroesophageal reflux disease)   . H/O: rheumatic fever as child  . Headache    occ left side migraine  . Hypertension   . Lung disease    Lung nodule  . 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 Pneumonia 2016  . Rheumatoid arthritis ([place])    oa also, back arthritis, hx rheumatoid     Patient Active Problem List   Diagnosis Date Noted  . Chest pain 02/17/2016  . Dyslipidemia 02/17/2016  . Family history of heart disease   . Dyspnea 01/20/2016  . Multiple pulmonary nodules determined by computed tomography of lung 01/20/2016  . Coronary artery calcification 08/23/2013  . Upper airway cough syndrome 08/19/2013  . Exertional chest pain 08/19/2013    Past Surgical History:  Procedure Laterality Date  . ABDOMINAL HYSTERECTOMY     ovaries left  . COLONOSCOPY WITH PROPOFOL N/A  03/23/2017   Procedure: COLONOSCOPY WITH PROPOFOL;  Surgeon: Arta Silence, MD;  Location: [facility] ENDOSCOPY;  Service: Endoscopy;  Laterality: N/A;  . colonscopy  2017  . DILATION AND CURETTAGE OF UTERUS     few done for endometriosis     OB History   No obstetric history on file.      Home Medications    Prior to Admission medications   Medication Sig Start Date End Date Taking? Authorizing Provider  acetaminophen (TYLENOL) 325 MG tablet Take 325 mg by mouth daily as needed for mild pain, fever or headache.   Yes [provider]  Menthol (HONEY LEMON COUGH DROPS MT) Use as directed 1 lozenge in the mouth or throat every 12 (twelve) hours as needed (for sore throat and cough).   Yes [provider]    Family History Family History  Problem Relation Age of Onset  . CAD Mother 22       CABG/stents  . Breast cancer Mother   . Asthma Father   . CAD Father 10  . Allergies Father   . CAD Brother 60       Died with MI  . CAD Brother 61       9 stents  . CAD Brother 16  7 stents    Social History Social History   Tobacco Use  . Smoking status: Never Smoker  . Smokeless tobacco: Never Used  Substance Use Topics  . Alcohol use: No  . Drug use: No     Allergies   Bee venom; Other; Aspirin; Erythromycin; and Penicillins   Review of Systems Review of Systems  All other systems reviewed and are negative.    Physical Exam Updated Vital Signs BP (!) 151/86 (BP Location: Left Arm)   Pulse 89   Temp 98.5 F (36.9 C) (Oral)   Resp 16   Ht 4\' 11"  (1.499 m)   Wt 77.1 kg   SpO2 96%   BMI 34.34 kg/m   Physical Exam Vitals signs and nursing note reviewed.  Constitutional:      General: She is not in acute distress.    Appearance: She is well-developed.  HENT:     Head: Normocephalic and atraumatic.  Neck:     Musculoskeletal: Normal range of motion.  Cardiovascular:     Rate and Rhythm: Normal rate and regular rhythm.     Heart sounds:  Normal heart sounds.  Pulmonary:     Effort: Pulmonary effort is normal.     Breath sounds: Normal breath sounds.  Abdominal:     General: There is no distension.     Palpations: Abdomen is soft.     Tenderness: There is no abdominal tenderness.  Musculoskeletal: Normal range of motion.  Skin:    General: Skin is warm and dry.  Neurological:     Mental Status: She is alert and oriented to person, place, and time.  Psychiatric:        Judgment: Judgment normal.      ED Treatments / Results  Labs (all labs ordered are listed, but only abnormal results are displayed) Labs Reviewed  INFLUENZA PANEL BY PCR (TYPE A & B)    EKG None  Radiology Dg Chest 2 View  Result Date: 08/09/2018 CLINICAL DATA:  Cough and fever.  Hypertension. EXAM: CHEST - 2 VIEW COMPARISON:  September 07, 2016 FINDINGS: Lungs are clear. The heart size and pulmonary vascularity are normal. No adenopathy. There is degenerative change in the thoracic spine. IMPRESSION: No edema or consolidation. Electronically Signed   By: Lowella Grip III M.D.   On: 08/09/2018 07:42    Procedures Procedures (including critical care time)  Medications Ordered in ED Medications  albuterol (PROVENTIL HFA;VENTOLIN HFA) 108 (90 Base) MCG/ACT inhaler 2 puff (2 puffs Inhalation Given 08/09/18 0805)  ibuprofen (ADVIL,MOTRIN) tablet 600 mg (600 mg Oral Given 08/09/18 0807)     Initial Impression / Assessment and Plan / ED Course  I have reviewed the triage vital signs and the nursing notes.  Pertinent labs & imaging results that were available during my care of the patient were reviewed by me and considered in my medical decision making (see chart for details).     Overall well-appearing.  Nontoxic.  Chest x-ray without infiltrate.  Cough improving with bronchodilators.  No wheezing.  Influenza negative.  Discharged home with symptomatic instructions with oral hydration, ibuprofen and Tylenol, bronchodilators as needed for cough.   Close primary care follow-up.  Patient understands return to the emergency department for new or worsening symptoms  Final Clinical Impressions(s) / ED Diagnoses   Final diagnoses:  Viral syndrome    ED Discharge Orders    None       Jola Schmidt, MD 08/09/18 [phone]

## 2018-08-09 NOTE — Discharge Instructions (Addendum)
Hydrate with water  Take ibuprofen and Tylenol for symptom control  Use the inhaler 2 puffs every 4 hours as needed for cough  Return the emergency department for any new or worsening symptoms

## 2018-08-09 NOTE — ED Triage Notes (Signed)
Pt arrived with complaints of cough, a sore throat, chills, body aches, and a fever that progressed since Saturday. Noted dry non productive cough in triage.

## 2018-08-19 NOTE — Progress Notes (Signed)
Cardiology Office Note   Date:  08/22/2018   ID:  Niti, Leisure 07/16/1955, MRN [id number]  PCP:  Leighton Ruff, MD  Cardiologist:   Minus Breeding, MD   Chief Complaint  Patient presents with  . Shortness of Breath      History of Present Illness: Kendra Gilbert is a 63 y.o. female who presents for follow up of coronary calcium.   When I saw her in 2015 she had a negative POET (Plain Old Exercise Treadmill).   Two years ago I saw her and she had a negative Lexiscan Myoview.  She returns for follow up.  Since I last saw her she has had some problems with her back.  She is being worked up for a renal mass.  She has had pulmonary nodules followed.  From a cardiovascular standpoint she does get some dyspnea with exertion.  This is been slowly progressive. The patient denies any new symptoms such as chest discomfort, neck or arm discomfort. There has been no new PND or orthopnea. There have been no reported palpitations, presyncope or syncope.      Past Medical History:  Diagnosis Date  . Asthma   . Chronic kidney disease    cyst in left kidney  . Colon cancer ([place]) 2017   surgical tx  removal of polyp  . Coronary artery calcification    a. POET in 2015 with no acute findings.  . Diarrhea since 02-16-17   c dif test normal per pt  . Dyspnea     with exertion, occ at rest no oxygen or inhaler use  . GERD (gastroesophageal reflux disease)   . H/O: rheumatic fever as child  . Headache    occ left side migraine  . Hypertension   . Lung disease    Lung nodule  . 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 Pneumonia 2016  . Rheumatoid arthritis ([place])    oa also, back arthritis, hx rheumatoid     Past Surgical History:  Procedure Laterality Date  . ABDOMINAL HYSTERECTOMY     ovaries left  . COLONOSCOPY WITH PROPOFOL N/A 03/23/2017   Procedure: COLONOSCOPY WITH PROPOFOL;  Surgeon: Arta Silence, MD;  Location: [facility];  Service:  Endoscopy;  Laterality: N/A;  . colonscopy  2017  . DILATION AND CURETTAGE OF UTERUS     few done for endometriosis     Current Outpatient Medications  Medication Sig Dispense Refill  . acetaminophen (TYLENOL) 325 MG tablet Take 325 mg by mouth daily as needed for mild pain, fever or headache.    . albuterol (PROVENTIL HFA) 108 (90 Base) MCG/ACT inhaler Inhale 2 puffs into the lungs every 4 (four) hours as needed for wheezing or shortness of breath.    . Menthol (HONEY LEMON COUGH DROPS MT) Use as directed 1 lozenge in the mouth or throat every 12 (twelve) hours as needed (for sore throat and cough).     No current facility-administered medications for this visit.     Allergies:   Bee venom; Other; Aspirin; Erythromycin; and Penicillins    ROS:  Please see the history of present illness.   Otherwise, review of systems are positive for none.   All other systems are reviewed and negative.    PHYSICAL EXAM: VS:  BP 118/86 (BP Location: Left Arm, Patient Position: Sitting, Cuff Size: Normal)   Pulse 62   Ht 4\' 11"  (1.499 m)   Wt 172 lb (78 kg)  BMI 34.74 kg/m  , BMI Body mass index is 34.74 kg/m. GENERAL:  Well appearing NECK:  No jugular venous distention, waveform within normal limits, carotid upstroke brisk and symmetric, no bruits, no thyromegaly LUNGS:  Clear to auscultation bilaterally CHEST:  Unremarkable HEART:  PMI not displaced or sustained,S1 and S2 within normal limits, no S3, no S4, no clicks, no rubs, no murmurs ABD:  Flat, positive bowel sounds normal in frequency in pitch, no bruits, no rebound, no guarding, no midline pulsatile mass, no hepatomegaly, no splenomegaly EXT:  2 plus pulses throughout, no edema, no cyanosis no clubbing    EKG:  EKG is  ordered today. The ekg ordered today demonstrates sinus rhythm, rate 62, axis within normal limits, intervals within normal limits, no acute ST-T wave changes.   Recent Labs: 10/05/2017: BUN 15; Creatinine, Ser 0.66;  Hemoglobin 14.3; Platelets 211; Potassium 3.4; Sodium 143     Wt Readings from Last 3 Encounters:  08/22/18 172 lb (78 kg)  08/09/18 170 lb (77.1 kg)  06/02/17 160 lb (72.6 kg)      Other studies Reviewed: Additional studies/ records that were reviewed today include: Labs. Review of the above records demonstrates:  See elsewhere   ASSESSMENT AND PLAN:  CORONARY CALCIUM:  She had a negative perfusion study in 2017.   Given the shortness of breath that she is complaining of I will bring her back for a POET (Plain Old Exercise Treadmill).  In the meantime she will continue with aggressive primary risk reduction.  DYSLIPIDEMIA:  LDL was as above.  Elevated.  However, I calculated her Framingham score is only 4.5%.  Therefore, statin is not indicated.  She has been intolerant anyway.  She does not take an aspirin because she is intolerant.  She will continue with lifestyle changes to include exercise and diet.  HISTORY OF RHD:   She is never had any evidence of structural heart disease.  Her exam is unremarkable.  No change in therapy or further imaging.   Current medicines are reviewed at length with the patient today.  The patient does not have concerns regarding medicines.  The following changes have been made: None  Labs/ tests ordered today include:    Orders Placed This Encounter  Procedures  . EXERCISE TOLERANCE TEST (ETT)  . EKG 12-Lead     Disposition:   FU with me in two years.     Signed, Minus Breeding, MD  08/22/2018 9:13 AM    [facility]

## 2018-08-22 ENCOUNTER — Encounter: Payer: Self-pay | Admitting: Cardiology

## 2018-08-22 ENCOUNTER — Ambulatory Visit: Payer: Medicaid Other | Admitting: Cardiology

## 2018-08-22 VITALS — BP 118/86 | HR 62 | Ht 59.0 in | Wt 172.0 lb

## 2018-08-22 DIAGNOSIS — R0602 Shortness of breath: Secondary | ICD-10-CM

## 2018-08-22 DIAGNOSIS — I2584 Coronary atherosclerosis due to calcified coronary lesion: Secondary | ICD-10-CM

## 2018-08-22 DIAGNOSIS — I2510 Atherosclerotic heart disease of native coronary artery without angina pectoris: Secondary | ICD-10-CM

## 2018-08-22 NOTE — Patient Instructions (Signed)
Medication Instructions:  Continue current medications  If you need a refill on your cardiac medications before your next appointment, please call your pharmacy.  Labwork: None Ordered   Testing/Procedures: Your physician has requested that you have an exercise tolerance test. For further information please visit [email or web address]. Please also follow instruction sheet, as given.  Follow-Up: You will need a follow up appointment in 2 years.  Please call our office 2 months in advance to schedule this appointment.  You may see Dr Percival Spanish or one of the following Advanced Practice Providers on your designated Care Team:   Rosaria Ferries, PA-C . Jory Sims, DNP, ANP  At [facility], you and your health needs are our priority.  As part of our continuing mission to provide you with exceptional heart care, we have created designated Provider Care Teams.  These Care Teams include your primary Cardiologist (physician) and Advanced Practice Providers (APPs -  Physician Assistants and Nurse Practitioners) who all work together to provide you with the care you need, when you need it.  Thank you for choosing [facility] at [facility]!!

## 2018-08-23 ENCOUNTER — Telehealth (HOSPITAL_COMMUNITY): Payer: Self-pay

## 2018-08-23 NOTE — Telephone Encounter (Signed)
Encounter complete. 

## 2018-08-29 ENCOUNTER — Ambulatory Visit (HOSPITAL_COMMUNITY)
Admission: RE | Admit: 2018-08-29 | Discharge: 2018-08-29 | Disposition: A | Discharge status: Home / Self Care | Payer: Medicaid Other | Source: Ambulatory Visit | Attending: Cardiology | Admitting: Cardiology

## 2018-08-29 DIAGNOSIS — R0602 Shortness of breath: Secondary | ICD-10-CM | POA: Insufficient documentation

## 2018-08-29 LAB — EXERCISE TOLERANCE TEST
CSEPED: 6 min
Estimated workload: 8.3 METS
Exercise duration (sec): 55 s
MPHR: 158 {beats}/min
Peak HR: 166 {beats}/min
Percent HR: 105 %
RPE: 17
Rest HR: 66 {beats}/min

## 2018-09-01 ENCOUNTER — Telehealth: Payer: Self-pay | Admitting: *Deleted

## 2018-09-01 DIAGNOSIS — R9439 Abnormal result of other cardiovascular function study: Secondary | ICD-10-CM

## 2018-09-01 MED ORDER — METOPROLOL TARTRATE 100 MG PO TABS: 100.0000 mg | ORAL_TABLET | Freq: Once | ORAL | 0 refills | Status: DC

## 2018-09-01 NOTE — Telephone Encounter (Signed)
-----   Message from Minus Breeding, MD sent at 08/29/2018  9:15 PM EST ----- This was an abnormal result.  She needs a coronary CTA to evaluate for obstructive CAD.  Please call and arrange.  Send results to Leighton Ruff, MD

## 2018-09-01 NOTE — Telephone Encounter (Signed)
Please arrive at the [facility] main entrance of [facility] at            AM (30-45 minutes prior to test start time)  [facility] [place], [place] [address] [phone]  Proceed to the [facility] Radiology Department (First Floor).  Please follow these instructions carefully (unless otherwise directed):  Hold all erectile dysfunction medications at least 48 hours prior to test.  On the Night Before the Test: . Be sure to Drink plenty of water. . Do not consume any caffeinated/decaffeinated beverages or chocolate 12 hours prior to your test. . Do not take any antihistamines 12 hours prior to your test. . If you take Metformin do not take 24 hours prior to test.   On the Day of the Test: . Drink plenty of water. Do not drink any water within one hour of the test. . Do not eat any food 4 hours prior to the test. . You may take your regular medications prior to the test.  . Take metoprolol (Lopressor) two hours prior to test.         HOLD Furosemide/Hydrochlorothiazide morning of the test.       After the Test: . Drink plenty of water. . After receiving IV contrast, you may experience a mild flushed feeling. This is normal. . On occasion, you may experience a mild rash up to 24 hours after the test. This is not dangerous. If this occurs, you can take Benadryl 25 mg and increase your fluid intake. . If you experience trouble breathing, this can be serious. If it is severe call 911 IMMEDIATELY. If it is mild, please call our office. . If you take any of these medications: Glipizide/Metformin, Avandament, Glucavance, please do not take 48 hours after completing test.

## 2018-09-07 LAB — BASIC METABOLIC PANEL
BUN/Creatinine Ratio: 19 (ref 12–28)
BUN: 13 mg/dL (ref 8–27)
CO2: 21 mmol/L (ref 20–29)
Calcium: 9.3 mg/dL (ref 8.7–10.3)
Chloride: 103 mmol/L (ref 96–106)
Creatinine, Ser: 0.7 mg/dL (ref 0.57–1.00)
GFR, EST AFRICAN AMERICAN: 107 mL/min/{1.73_m2} (ref >59)
GFR, EST NON AFRICAN AMERICAN: 93 mL/min/{1.73_m2} (ref >59)
Glucose: 75 mg/dL (ref 65–99)
POTASSIUM: 3.8 mmol/L (ref 3.5–5.2)
SODIUM: 140 mmol/L (ref 134–144)

## 2018-09-08 ENCOUNTER — Telehealth: Payer: Self-pay | Admitting: Cardiology

## 2018-09-08 NOTE — Telephone Encounter (Signed)
Follow up:    Patient returning a call back. Please patient back.

## 2018-09-08 NOTE — Telephone Encounter (Signed)
Pt made aware of her blood work

## 2018-09-12 ENCOUNTER — Ambulatory Visit (HOSPITAL_COMMUNITY)
Admission: RE | Admit: 2018-09-12 | Discharge: 2018-09-12 | Disposition: A | Discharge status: Home / Self Care | Payer: Medicaid Other | Source: Ambulatory Visit | Attending: Urology | Admitting: Urology

## 2018-09-12 DIAGNOSIS — D49512 Neoplasm of unspecified behavior of left kidney: Secondary | ICD-10-CM | POA: Diagnosis not present

## 2018-09-12 MED ORDER — GADOBUTROL 1 MMOL/ML IV SOLN
7.0000 mL | Freq: Once | INTRAVENOUS | Status: AC | PRN
  Administered 2018-09-12: 7 mL via INTRAVENOUS

## 2018-09-15 ENCOUNTER — Telehealth (HOSPITAL_COMMUNITY): Payer: Self-pay | Admitting: Emergency Medicine

## 2018-09-15 NOTE — Telephone Encounter (Signed)
Reaching out to patient to offer assistance regarding upcoming cardiac imaging study; pt verbalizes understanding of appt date/time, parking situation and where to check in, pre-test NPO status and medications ordered, and verified current allergies; name and call back number provided for further questions should they arise Tiwana Chavis RN Navigator Cardiac Imaging [facility] [phone] office [phone] cell 

## 2018-09-18 ENCOUNTER — Encounter (HOSPITAL_COMMUNITY): Payer: Self-pay

## 2018-09-18 ENCOUNTER — Other Ambulatory Visit: Payer: Self-pay

## 2018-09-18 ENCOUNTER — Ambulatory Visit (HOSPITAL_COMMUNITY)
Admission: RE | Admit: 2018-09-18 | Discharge: 2018-09-18 | Disposition: A | Discharge status: Home / Self Care | Payer: Medicaid Other | Source: Ambulatory Visit | Attending: Cardiology | Admitting: Cardiology

## 2018-09-18 DIAGNOSIS — R9439 Abnormal result of other cardiovascular function study: Secondary | ICD-10-CM

## 2018-09-18 DIAGNOSIS — Z006 Encounter for examination for normal comparison and control in clinical research program: Secondary | ICD-10-CM

## 2018-09-18 DIAGNOSIS — R9430 Abnormal result of cardiovascular function study, unspecified: Secondary | ICD-10-CM | POA: Diagnosis not present

## 2018-09-18 MED ORDER — IOHEXOL 350 MG/ML SOLN
80.0000 mL | Freq: Once | INTRAVENOUS | Status: AC | PRN
  Administered 2018-09-18: 80 mL via INTRAVENOUS

## 2018-09-18 MED ORDER — NITROGLYCERIN 0.4 MG SL SUBL
SUBLINGUAL_TABLET | SUBLINGUAL | Status: AC
  Filled 2018-09-18: qty 2

## 2018-09-18 MED ORDER — NITROGLYCERIN 0.4 MG SL SUBL
0.8000 mg | SUBLINGUAL_TABLET | Freq: Once | SUBLINGUAL | Status: AC
  Administered 2018-09-18: 0.8 mg via SUBLINGUAL
  Filled 2018-09-18: qty 25

## 2018-09-18 NOTE — Research (Signed)
Cadfem Informed Consent    Patient Name: Kendra Gilbert   Subject met inclusion and exclusion criteria.  The informed consent form, study requirements and expectations were reviewed with the subject and questions and concerns were addressed prior to the signing of the consent form.  The subject verbalized understanding of the trail requirements.  The subject agreed to participate in the CADFEM trial and signed the informed consent.  The informed consent was obtained prior to performance of any protocol-specific procedures for the subject.  A copy of the signed informed consent was given to the subject and a copy was placed in the subject's medical record.   Neva Seat

## 2018-09-20 DIAGNOSIS — M5030 Other cervical disc degeneration, unspecified cervical region: Secondary | ICD-10-CM | POA: Insufficient documentation

## 2018-09-21 ENCOUNTER — Telehealth: Payer: Self-pay | Admitting: Cardiology

## 2018-09-21 NOTE — Telephone Encounter (Signed)
New Message   Pt is calling about CT results  Please call back

## 2018-09-21 NOTE — Telephone Encounter (Signed)
Call and spoke to let her know Dr Percival Spanish have not resulted on her CT and as soon as he do so we will call her back

## 2018-09-23 NOTE — Progress Notes (Signed)
Looks like non obstructive disease that can be managed medically.  Follow up in 2 months.  I am waiting for the final part of this test but OK to give her these results for reassurance.  Send results to Leighton Ruff, MD.

## 2018-09-25 ENCOUNTER — Telehealth: Payer: Self-pay | Admitting: Cardiology

## 2018-09-25 NOTE — Telephone Encounter (Signed)
noted 

## 2018-09-25 NOTE — Telephone Encounter (Signed)
Follow Up:   She wanted you to know she received your message about her appt on Thursday with Jory Sims.

## 2018-09-26 NOTE — Progress Notes (Signed)
Cardiology Office Note   Date:  09/28/2018   ID:  Kendra, Gilbert 11/11/1955, MRN [id number]  PCP:  Leighton Ruff, MD  Cardiologist:  [facility]  Chief Complaint  Patient presents with  . Hypertension  . Chest Pain     History of Present Illness: Kendra Gilbert is a 63 y.o. female who presents for ongoing assessment and management of hypertension and chest pain. She had a cardiac CT on 09/18/2018 with no significant stenosis in any main artery.    Past Medical History:  Diagnosis Date  . Asthma   . Chronic kidney disease    cyst in left kidney  . Colon cancer ([place]) 2017   surgical tx  removal of polyp  . Coronary artery calcification    a. POET in 2015 with no acute findings.  . Diarrhea since 02-16-17   c dif test normal per pt  . Dyspnea     with exertion, occ at rest no oxygen or inhaler use  . GERD (gastroesophageal reflux disease)   . H/O: rheumatic fever as child  . Headache    occ left side migraine  . Hypertension   . Lung disease    Lung nodule  . 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 Pneumonia 2016  . Rheumatoid arthritis ([place])    oa also, back arthritis, hx rheumatoid     Past Surgical History:  Procedure Laterality Date  . ABDOMINAL HYSTERECTOMY     ovaries left  . COLONOSCOPY WITH PROPOFOL N/A 03/23/2017   Procedure: COLONOSCOPY WITH PROPOFOL;  Surgeon: Arta Silence, MD;  Location: [facility];  Service: Endoscopy;  Laterality: N/A;  . colonscopy  2017  . DILATION AND CURETTAGE OF UTERUS     few done for endometriosis     Current Outpatient Medications  Medication Sig Dispense Refill  . acetaminophen (TYLENOL) 325 MG tablet Take 325 mg by mouth daily as needed for mild pain, fever or headache.    . albuterol (PROVENTIL HFA) 108 (90 Base) MCG/ACT inhaler Inhale 2 puffs into the lungs every 4 (four) hours as needed for wheezing or shortness of breath.    Marland Kitchen amLODipine (NORVASC) 5 MG tablet Take 1 tablet (5 mg  total) by mouth daily. 90 tablet 1  . Menthol (HONEY LEMON COUGH DROPS MT) Use as directed 1 lozenge in the mouth or throat every 12 (twelve) hours as needed (for sore throat and cough).     No current facility-administered medications for this visit.     Allergies:   Bee venom; Other; Aspirin; Erythromycin; and Penicillins    Social History:  The patient  reports that she has never smoked. She has never used smokeless tobacco. She reports that she does not drink alcohol or use drugs.   Family History:  The patient's family history includes Allergies in her father; Asthma in her father; Breast cancer in her mother; CAD (age of onset: 73) in her brother, father, and mother; CAD (age of onset: 70) in her brother; CAD (age of onset: 20) in her brother.    ROS: All other systems are reviewed and negative. Unless otherwise mentioned in H&P    PHYSICAL EXAM: VS:  BP (!) 159/86   Pulse 72   Ht 4\' 11"  (1.499 m)   Wt 170 lb 12.8 oz (77.5 kg)   BMI 34.50 kg/m  , BMI Body mass index is 34.5 kg/m. GEN: Well nourished, well developed, in no acute distress, obese HEENT: normal Neck: no  JVD, carotid bruits, or masses Cardiac: RRR; no murmurs, rubs, or gallops,no edema  Respiratory:  Clear to auscultation bilaterally, normal work of breathing GI: soft, nontender, nondistended, + BS MS: no deformity or atrophy Skin: warm and dry, no rash Neuro:  Strength and sensation are intact Psych: euthymic mood, full affect   EKG:  Not completed.   Recent Labs: 10/05/2017: Hemoglobin 14.3; Platelets 211 09/07/2018: BUN 13; Creatinine, Ser 0.70; Potassium 3.8; Sodium 140    Lipid Panel    Component Value Date/Time   CHOL 228 (H) 02/06/2016 0930   TRIG 93 02/06/2016 0930   HDL 69 02/06/2016 0930   CHOLHDL 3.3 02/06/2016 0930   VLDL 19 02/06/2016 0930   LDLCALC 140 (H) 02/06/2016 0930   LDLDIRECT 135.2 08/23/2013 1142      Wt Readings from Last 3 Encounters:  09/28/18 170 lb 12.8 oz (77.5 kg)   08/22/18 172 lb (78 kg)  08/09/18 170 lb (77.1 kg)      Other studies Reviewed:  Cardiac CT 09/18/2018  1. Left Main:  No significant stenosis.  2. LAD: No significant stenosis. 3. LCX: No significant stenosis. 4. RCA: No significant stenosis.  IMPRESSION: 1.  CT FFR analysis didn't show any significant stenosis.  ASSESSMENT AND PLAN:  1. Chest pain: Multifactorial, obesity, reactive lung disease, and hypertension. She was negative for significant stenosis per cardiac CT. No additional testing is warranted at this time.   2. Hypertension: I will begin amlodipine 2.5 mg (1/2 tablet of a 5mg ), to begin antihypertensive therapy, in the setting of lung disease. She will take her BP daily and record. We will call her to check her BP report in one week. If BP is not <140/90, will increase her dose to the full 5 mg daily.   3.Obesity:  She is an "apple" concerning body type. She has been gaining weight despite walking 2 hours daily. She has a FH of thyroid disease. Checking TSH today.   4. DOE: Hx of interstitial lung disease: Consider RHC if her BP and breathing status does not change.   Current medicines are reviewed at length with the patient today.    Labs/ tests ordered today include: TSH   Phill Myron. [place] Pugh, ANP, AACC   09/28/2018 9:06 AM    [address] Office [phone] Fax [phone]

## 2018-09-28 ENCOUNTER — Other Ambulatory Visit: Payer: Self-pay

## 2018-09-28 ENCOUNTER — Ambulatory Visit (INDEPENDENT_AMBULATORY_CARE_PROVIDER_SITE_OTHER): Payer: Medicaid Other | Admitting: Adult Health

## 2018-09-28 ENCOUNTER — Encounter: Payer: Self-pay | Admitting: Adult Health

## 2018-09-28 ENCOUNTER — Telehealth: Payer: Self-pay

## 2018-09-28 VITALS — BP 159/86 | HR 72 | Ht 59.0 in | Wt 170.8 lb

## 2018-09-28 DIAGNOSIS — R0789 Other chest pain: Secondary | ICD-10-CM

## 2018-09-28 DIAGNOSIS — I10 Essential (primary) hypertension: Secondary | ICD-10-CM | POA: Diagnosis not present

## 2018-09-28 DIAGNOSIS — R0602 Shortness of breath: Secondary | ICD-10-CM

## 2018-09-28 DIAGNOSIS — Z79899 Other long term (current) drug therapy: Secondary | ICD-10-CM

## 2018-09-28 MED ORDER — AMLODIPINE BESYLATE 5 MG PO TABS: 5.0000 mg | ORAL_TABLET | Freq: Every day | ORAL | 1 refills | Status: DC

## 2018-09-28 NOTE — Patient Instructions (Addendum)
Medication Instructions:  START AMLODIPINE 2.5MG  (1/2 TAB) FOR ONE WEEK If you need a refill on your cardiac medications before your next appointment, please call your pharmacy.  Labwork: TSH TODAY HERE IN OUR OFFICE AT [facility]  Take the provided lab slips with you to the lab for your blood draw.   When you have your labs (blood work) drawn today and your tests are completely normal, you will receive your results only by MyChart Message (if you have MyChart) -OR-  A paper copy in the mail.  If you have any lab test that is abnormal or we need to change your treatment, we will call you to review these results.  Special Instructions: TAKE AND LOG YOUR BP DAILY AT 1PM  Follow-Up: You will need a follow up appointment in 3 months.  Please call our office 2 months in advance to schedule this appointment.  You may see Minus Breeding, MD  , Jory Sims, DNP, [place] or one of the following Advanced Practice Providers on your designated Care Team: Rosaria Ferries, PA-C , Jory Sims, DNP, [place], you and your health needs are our priority.  As part of our continuing mission to provide you with exceptional heart care, we have created designated Provider Care Teams.  These Care Teams include your primary Cardiologist (physician) and Advanced Practice Providers (APPs -  Physician Assistants and Nurse Practitioners) who all work together to provide you with the care you need, when you need it.  Thank you for choosing [facility] at [facility]!!

## 2018-09-28 NOTE — Telephone Encounter (Signed)
Call pt Wednesday 10-04-2018 and check vitalsigns

## 2018-09-29 LAB — TSH: TSH: 2.67 u[IU]/mL (ref 0.450–4.500)

## 2018-10-05 ENCOUNTER — Telehealth: Payer: Self-pay | Admitting: Cardiology

## 2018-10-05 NOTE — Telephone Encounter (Signed)
New Message         Patient is calling in to give her blood pressure readings   03/27 B/P152/85 HR 66  03/29 B/P 142/77 HR 68 03/30 B/P 138 HR 81 03/31 B/P 132 HR 73 04/1 B/P 127/76 HR76   Patient states that she takes her B/P every day at 1:00 PM. Patient states she will be available after 11.

## 2018-10-05 NOTE — Telephone Encounter (Signed)
Called pt she states that she thinks that her cp is fine and c/o "a little bit of swelling/pressure" in her LE. She states that she thinks it is because of increased walking and the heat. She will try to keep her LE elevated and wait for further direction from [place] on Monday.  See other message: Patient is calling in to give her blood pressure readings   03/27 B/P152/85 HR 66  03/29 B/P 142/77 HR 68 03/30 B/P 138 HR 81 03/31 B/P 132 HR 73 04/1 B/P 127/76 HR76   Patient states that she takes her B/P every day at 1:00 PM. Patient states she will be available after 11.

## 2018-10-05 NOTE — Telephone Encounter (Signed)
Called to get BP's for the week for KL to review LM2CB

## 2018-10-09 NOTE — Telephone Encounter (Signed)
Per Dr.Hochrien's note. Continue to taker medications as she is doing now, no need to further adjust them.

## 2018-10-09 NOTE — Telephone Encounter (Signed)
Noted. Called patient and notified.  Patient verbalized understanding.

## 2018-10-09 NOTE — Telephone Encounter (Signed)
Called patient, she states that she called on Friday- gave her readings for the previous week.   All she needs to know if she should continue to cut the pill in half or not.

## 2018-10-09 NOTE — Telephone Encounter (Signed)
F/U Message        Patient is returning Nya's call

## 2018-10-09 NOTE — Telephone Encounter (Signed)
Leave message for pt to call back 

## 2018-10-09 NOTE — Telephone Encounter (Signed)
Call her and see what the readings were last week.  It is unlikely that I will need to change her meds.

## 2018-10-12 ENCOUNTER — Telehealth: Payer: Self-pay | Admitting: Cardiology

## 2018-10-12 NOTE — Telephone Encounter (Signed)
Looks like BP is having normal fluctuations. Would not change anything at this time. Head pain could possibly be from high pollen count causing sinus pressure and headaches.

## 2018-10-12 NOTE — Telephone Encounter (Signed)
Per pt's call reporting BP & HR  4/3  118/63   81  4/4  120/75    76  4/5  144/91   84  4/6  135/74   84  4/7  139/80   81  4/8  122/66   71  4/9  123/71   74  A lot of head pain today yesterday two days before last week.  Still feeling leg heaviness.

## 2018-10-13 NOTE — Telephone Encounter (Signed)
Follow up  Patient is returning your call. Please call the patient.

## 2018-10-13 NOTE — Telephone Encounter (Signed)
Left message to call back  

## 2018-10-16 NOTE — Telephone Encounter (Signed)
Left detailed message with KL details CB if any other issues with BP

## 2018-10-16 NOTE — Telephone Encounter (Signed)
Calling pt.

## 2018-10-16 NOTE — Telephone Encounter (Signed)
Left message to call back  

## 2018-11-30 ENCOUNTER — Encounter (HOSPITAL_COMMUNITY): Payer: Self-pay | Admitting: Emergency Medicine

## 2018-11-30 ENCOUNTER — Emergency Department (HOSPITAL_BASED_OUTPATIENT_CLINIC_OR_DEPARTMENT_OTHER): Payer: Medicaid Other

## 2018-11-30 ENCOUNTER — Telehealth: Payer: Self-pay | Admitting: Cardiology

## 2018-11-30 ENCOUNTER — Emergency Department (HOSPITAL_COMMUNITY)
Admission: EM | Admit: 2018-11-30 | Discharge: 2018-11-30 | Disposition: A | Discharge status: Home / Self Care | Payer: Medicaid Other | Attending: Emergency Medicine | Admitting: Emergency Medicine

## 2018-11-30 ENCOUNTER — Other Ambulatory Visit: Payer: Self-pay

## 2018-11-30 ENCOUNTER — Telehealth: Payer: Self-pay | Admitting: Adult Health

## 2018-11-30 DIAGNOSIS — I2510 Atherosclerotic heart disease of native coronary artery without angina pectoris: Secondary | ICD-10-CM | POA: Diagnosis not present

## 2018-11-30 DIAGNOSIS — Z79899 Other long term (current) drug therapy: Secondary | ICD-10-CM | POA: Diagnosis not present

## 2018-11-30 DIAGNOSIS — M7989 Other specified soft tissue disorders: Secondary | ICD-10-CM

## 2018-11-30 DIAGNOSIS — M069 Rheumatoid arthritis, unspecified: Secondary | ICD-10-CM | POA: Insufficient documentation

## 2018-11-30 DIAGNOSIS — R609 Edema, unspecified: Secondary | ICD-10-CM | POA: Diagnosis not present

## 2018-11-30 DIAGNOSIS — I10 Essential (primary) hypertension: Secondary | ICD-10-CM | POA: Insufficient documentation

## 2018-11-30 MED ORDER — ALBUTEROL SULFATE HFA 108 (90 BASE) MCG/ACT IN AERS: 8.0000 | INHALATION_SPRAY | Freq: Once | RESPIRATORY_TRACT | Status: DC

## 2018-11-30 NOTE — Progress Notes (Signed)
Left lower extremity venous duplex has been completed. Preliminary results can be found in CV Proc through chart review.  Results were given to Dr. Eulis Foster.  11/30/18 12:50 PM Carlos Levering RVT

## 2018-11-30 NOTE — Telephone Encounter (Signed)
Pt c/o medication issue:  1. Name of Medication: amLODipine (NORVASC) 5 MG tablet   2. How are you currently taking this medication (dosage and times per day)? Only taking 1/2 a tablet daily   3. Are you having a reaction (difficulty breathing--STAT)? no  4. What is your medication issue? Past couple of weeks she has noticed swelling in her hands, ankles, feet, and legs.  As well as a little bit of chest tightness.

## 2018-11-30 NOTE — Telephone Encounter (Signed)
Spoke with pt. She report for the last 3 weeks, she has been experiencing swelling in her hands, legs, feet, and ankle. She state it will go away for a few days and then return. She also report a little SOB and chest tightness across her chest that she is currently experiencing. Informed pt that based off her symptoms, I would recommend she report to ED for further evaluations. Pt state she doesn't have transportation but will arrange for someone to take her. [facility] made aware.

## 2018-11-30 NOTE — Discharge Instructions (Addendum)
Elevate your legs during the day for 2 or 3 hours.  Consider using compression stockings to improve blood flow to the heart.  Follow-up with your PCP or cardiologist regarding your medications and consider changes as needed.

## 2018-11-30 NOTE — ED Triage Notes (Signed)
Per pt, states she just started Norvasc 2.5mg  about 3 weeks ago-states she started having B/L LE swelling and redness-chest tightness-states she did not inform her PCP

## 2018-11-30 NOTE — ED Notes (Signed)
Bed: WTR7 Expected date:  Expected time:  Means of arrival:  Comments: 

## 2018-11-30 NOTE — ED Provider Notes (Signed)
[facility] DEPT Provider Note   CSN: [id number] Arrival date & time: 11/30/18  1054    History   Chief Complaint Chief Complaint  Patient presents with  . Medication Reaction    HPI Kendra Gilbert is a 63 y.o. female.     HPI   She presents for evaluation of gradually worse swelling of her legs, for several weeks.  Swelling is on and off, and today it was worse than ever.  She had some mild chest discomfort today, but denies cough, fever, chills, weakness or dizziness.  She is taking her usual prescribed medications.  Her blood pressure has improved since starting on Norvasc, 09/28/2018.  She contacted her cardiologist office, this morning, and they advised that she come here for evaluation.  She states she has been eating well and staying on a low sodium diet.  There are no other known modifying factors.  Past Medical History:  Diagnosis Date  . Asthma   . Chronic kidney disease    cyst in left kidney  . Colon cancer ([place]) 2017   surgical tx  removal of polyp  . Coronary artery calcification    a. POET in 2015 with no acute findings.  . Diarrhea since 02-16-17   c dif test normal per pt  . Dyspnea     with exertion, occ at rest no oxygen or inhaler use  . GERD (gastroesophageal reflux disease)   . H/O: rheumatic fever as child  . Headache    occ left side migraine  . Hypertension   . Lung disease    Lung nodule  . 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 Pneumonia 2016  . Rheumatoid arthritis ([place])    oa also, back arthritis, hx rheumatoid     Patient Active Problem List   Diagnosis Date Noted  . Chest pain 02/17/2016  . Dyslipidemia 02/17/2016  . Family history of heart disease   . Dyspnea 01/20/2016  . Multiple pulmonary nodules determined by computed tomography of lung 01/20/2016  . Coronary artery calcification 08/23/2013  . Upper airway cough syndrome 08/19/2013  . Exertional chest pain 08/19/2013     Past Surgical History:  Procedure Laterality Date  . ABDOMINAL HYSTERECTOMY     ovaries left  . COLONOSCOPY WITH PROPOFOL N/A 03/23/2017   Procedure: COLONOSCOPY WITH PROPOFOL;  Surgeon: Arta Silence, MD;  Location: [facility];  Service: Endoscopy;  Laterality: N/A;  . colonscopy  2017  . DILATION AND CURETTAGE OF UTERUS     few done for endometriosis     OB History   No obstetric history on file.      Home Medications    Prior to Admission medications   Medication Sig Start Date End Date Taking? Authorizing Provider  acetaminophen (TYLENOL) 325 MG tablet Take 325 mg by mouth daily as needed for mild pain, fever or headache.    [provider]  albuterol (PROVENTIL HFA) 108 (90 Base) MCG/ACT inhaler Inhale 2 puffs into the lungs every 4 (four) hours as needed for wheezing or shortness of breath.    [provider]  amLODipine (NORVASC) 5 MG tablet Take 1 tablet (5 mg total) by mouth daily. 09/28/18 12/27/18  Lendon Colonel, NP  Menthol (HONEY LEMON COUGH DROPS MT) Use as directed 1 lozenge in the mouth or throat every 12 (twelve) hours as needed (for sore throat and cough).    [provider]    Family History Family History  Problem Relation Age of Onset  . CAD Mother 51       CABG/stents  . Breast cancer Mother   . Asthma Father   . CAD Father 99  . Allergies Father   . CAD Brother 25       Died with MI  . CAD Brother 63       9 stents  . CAD Brother 89       7 stents    Social History Social History   Tobacco Use  . Smoking status: Never Smoker  . Smokeless tobacco: Never Used  Substance Use Topics  . Alcohol use: No  . Drug use: No     Allergies   Bee venom; Other; Aspirin; Erythromycin; and Penicillins   Review of Systems Review of Systems  All other systems reviewed and are negative.    Physical Exam Updated Vital Signs BP (!) 144/73 (BP Location: Right Arm)   Pulse 77   Temp 98.7 F (37.1 C) (Oral)    Resp 20   SpO2 99%   Physical Exam Vitals signs and nursing note reviewed.  Constitutional:      General: She is not in acute distress.    Appearance: She is well-developed. She is obese. She is not ill-appearing, toxic-appearing or diaphoretic.  HENT:     Head: Normocephalic and atraumatic.     Right Ear: External ear normal.     Left Ear: External ear normal.     Nose: No congestion.     Mouth/Throat:     Pharynx: No oropharyngeal exudate or posterior oropharyngeal erythema.  Eyes:     Conjunctiva/sclera: Conjunctivae normal.     Pupils: Pupils are equal, round, and reactive to light.  Neck:     Musculoskeletal: Normal range of motion and neck supple.     Trachea: Phonation normal.  Cardiovascular:     Rate and Rhythm: Normal rate and regular rhythm.     Heart sounds: Normal heart sounds. No murmur. No friction rub.  Pulmonary:     Effort: Pulmonary effort is normal.     Breath sounds: Normal breath sounds.  Abdominal:     General: There is no distension.     Palpations: Abdomen is soft.     Tenderness: There is no abdominal tenderness.  Musculoskeletal: Normal range of motion.     Right lower leg: Edema present.     Left lower leg: Edema present.     Comments: 1+ lower leg edema, bilaterally.  Mild asymmetry with left leg slightly larger than right.  Mild tenderness left calf to palpation.  Negative Homans.  Skin:    General: Skin is warm and dry.     Comments: Mild petechiae around the right medial ankle, at the sock line.  Neurological:     Mental Status: She is alert and oriented to person, place, and time.     Cranial Nerves: No cranial nerve deficit.     Sensory: No sensory deficit.     Motor: No abnormal muscle tone.     Coordination: Coordination normal.  Psychiatric:        Mood and Affect: Mood normal.        Behavior: Behavior normal.        Thought Content: Thought content normal.        Judgment: Judgment normal.      ED Treatments / Results   Labs (all labs ordered are listed, but only abnormal results are displayed) Labs Reviewed - No  data to display  EKG EKG Interpretation  Date/Time:  Thursday Nov 30 2018 11:04:35 EDT Ventricular Rate:  70 PR Interval:    QRS Duration: 91 QT Interval:  401 QTC Calculation: 433 R Axis:   24 Text Interpretation:  Sinus rhythm Abnormal R-wave progression, early transition LVH by voltage since last tracing no significant change Confirmed by Daleen Bo [phone]) on 11/30/2018 11:14:14 AM Also confirmed by Daleen Bo [phone]), editor Philomena Doheny [phone])  on 11/30/2018 1:01:13 PM   Radiology Vas [place] Lower Extremity Venous (dvt) (only [place])  Result Date: 11/30/2018  Lower Venous Study Indications: Swelling.  Performing Technologist: Oliver Hum RVT  Examination Guidelines: A complete evaluation includes B-mode imaging, spectral Doppler, color Doppler, and power Doppler as needed of all accessible portions of each vessel. Bilateral testing is considered an integral part of a complete examination. Limited examinations for reoccurring indications may be performed as noted.  +-----+---------------+---------+-----------+----------+-------+ RIGHTCompressibilityPhasicitySpontaneityPropertiesSummary +-----+---------------+---------+-----------+----------+-------+ CFV  Full           Yes      Yes                          +-----+---------------+---------+-----------+----------+-------+   +---------+---------------+---------+-----------+----------+-------+ LEFT     CompressibilityPhasicitySpontaneityPropertiesSummary +---------+---------------+---------+-----------+----------+-------+ CFV      Full           Yes      Yes                          +---------+---------------+---------+-----------+----------+-------+ SFJ      Full                                                 +---------+---------------+---------+-----------+----------+-------+ FV Prox  Full                                                  +---------+---------------+---------+-----------+----------+-------+ FV Mid   Full                                                 +---------+---------------+---------+-----------+----------+-------+ FV DistalFull                                                 +---------+---------------+---------+-----------+----------+-------+ PFV      Full                                                 +---------+---------------+---------+-----------+----------+-------+ POP      Full           Yes      Yes                          +---------+---------------+---------+-----------+----------+-------+ PTV      Full                                                 +---------+---------------+---------+-----------+----------+-------+  PERO     Full                                                 +---------+---------------+---------+-----------+----------+-------+     Summary: Right: No evidence of common femoral vein obstruction. Left: There is no evidence of deep vein thrombosis in the lower extremity. No cystic structure found in the popliteal fossa.  *See table(s) above for measurements and observations. Electronically signed by Servando Snare MD on 11/30/2018 at 3:31:46 PM.    Final     Procedures Procedures (including critical care time)  Medications Ordered in ED Medications - No data to display   Initial Impression / Assessment and Plan / ED Course  I have reviewed the triage vital signs and the nursing notes.  Pertinent labs & imaging results that were available during my care of the patient were reviewed by me and considered in my medical decision making (see chart for details).         Patient Vitals for the past 24 hrs:  BP Temp Temp src Pulse Resp SpO2  11/30/18 1350 (!) 144/73 98.7 F (37.1 C) Oral 77 20 99 %  11/30/18 1348 - - - 71 15 99 %  11/30/18 1347 (!) 144/73 - - 60 19 100 %  11/30/18 1342 - - - - 19 -  11/30/18  1106 (!) 191/83 98.7 F (37.1 C) Oral 77 (!) 21 100 %    At discharge- reevaluation with update and discussion. After initial assessment and treatment, an updated evaluation reveals she is comfortable has no further complaints.  Findings discussed with the patient and all questions were answered. Daleen Bo   Medical Decision Making: Patient presenting with peripheral edema, without central symptoms.  These are most likely being caused by medications.  The patient feels like her left leg is swollen more than her right therefore a DVT study was done of the left leg.  There is no evidence for DVT.  Doubt acute congestive heart failure, ACS, metabolic instability or impending vascular collapse.  CRITICAL CARE-no Performed by: Daleen Bo  Nursing Notes Reviewed/ Care Coordinated Applicable Imaging Reviewed Interpretation of Laboratory Data incorporated into ED treatment  The patient appears reasonably screened and/or stabilized for discharge and I doubt any other medical condition or other [facility] requiring further screening, evaluation, or treatment in the ED at this time prior to discharge.  Plan: Home Medications-continue current medications; Home Treatments-elevate legs above heart as much as possible, consider compression stockings; return here if the recommended treatment, does not improve the symptoms; Recommended follow up-PCP follow-up 1 week and as needed.   Final Clinical Impressions(s) / ED Diagnoses   Final diagnoses:  Peripheral edema    ED Discharge Orders    None       Daleen Bo, MD 11/30/18 2022

## 2018-11-30 NOTE — Telephone Encounter (Signed)
Returned call pt she states that she was directed to go to the ER this morning. She is  Having very swollen legs(to the mid-calf), feet, hands and fingers. She stats that this khas been happening for a week. She will be very swollen for a couple of days then the swelling will go down then very swollen again.  In the ER they "did not do anything" they took and EKG (normal) told that she does not have a blood clot.no medication changes. They just told her to elevate LE and get some compression stockings. Her BP in the ER was 191/80's. She states that they told her that this is a side effect of the amlodipine but did not want to tell her to stop it, and to call us and ask if she should stop it.  She states that her ankles are pink discoloration-this is new.  Should she continue to take the Amlodipine or stop. Please advise

## 2018-11-30 NOTE — Telephone Encounter (Signed)
New Message   Pt c/o medication issue:  1. Name of Medication: Amlodipine  2. How are you currently taking this medication (dosage and times per day)? 5mg  1/2 tablet by mouth daily   3. Are you having a reaction (difficulty breathing--STAT)? NO  4. What is your medication issue? Patient want's to know if she should continue taking the medication or not.

## 2018-12-01 NOTE — Telephone Encounter (Signed)
LM2CB-needs virtual appt on Monday with Belenda Cruise

## 2018-12-01 NOTE — Telephone Encounter (Signed)
She should continue until she gets a visit (could be virtual).  I don't know if Kendra Gilbert has any openings.

## 2018-12-03 NOTE — Progress Notes (Signed)
Virtual Visit via Telephone Note   This visit type was conducted due to national recommendations for restrictions regarding the COVID-19 Pandemic (e.g. social distancing) in an effort to limit this patient's exposure and mitigate transmission in our community.  Due to her co-morbid illnesses, this patient is at least at moderate risk for complications without adequate follow up.  This format is felt to be most appropriate for this patient at this time.  The patient did not have access to video technology/had technical difficulties with video requiring transitioning to audio format only (telephone).  All issues noted in this document were discussed and addressed.  No physical exam could be performed with this format.  Please refer to the patient's chart for her  consent to telehealth for [facility].   Date:  12/04/2018   ID:  Kendra, Gilbert December 21, 1955, MRN [id number]  Patient Location: Home Provider Location: Home  PCP:  Leighton Ruff, MD  Cardiologist:  Minus Breeding, MD  Electrophysiologist:  None   Evaluation Performed:  Follow-Up Visit  Chief Complaint:  LEE and Hypertension  History of Present Illness:    Kendra Gilbert is a 63 y.o. female we are following for ongoing assessment and management of hypertension and chronic chest pain.  Patient did have a cardiac CTA on 09/18/2018 with no significant stenosis of any main artery.  The patient also has a history of asthma, chronic kidney disease with a cyst in the left kidney, chronic dyspnea on exertion and GERD.  She was last seen in the office on 09/28/2018, with elevated blood pressure and therefore amlodipine 2.5 mg was ordered and she was to take her blood pressure daily and record.  If blood pressure was not less than 140/90 would increase her amlodipine to 5 mg daily.  A TSH was ordered due to weight gain despite walking 2 hours a day she was not losing weight.  She continued to have interstitial lung disease  diagnosis with chronic dyspnea on exertion.  I did review her blood pressure readings on 10/12/2018 and it was felt that her blood pressure fluctuations were normal and therefore no changes were made in her medication regimen.  She was complaining of sinus pain which was likely from high pollen count and allergies.  She called her office on 11/30/2018 wanting to know if she was to continue taking her amlodipine 2.5 mg daily.  This was addressed by Dr. Percival Spanish on 12/01/2018 who instructed her to continue to take her medications as directed.  Her BP's have been essentially normal at home. She is continuing to take the amlodipine. She is experiencing some LEE with worsening in the ankles. She was seen in the ER on 11/30/2018 and assessed. She was ruled out for DVT and CHF.   The patient does not  have symptoms concerning for COVID-19 infection (fever, chills, cough, or new shortness of breath).    Past Medical History:  Diagnosis Date  . Asthma   . Chronic kidney disease    cyst in left kidney  . Colon cancer ([place]) 2017   surgical tx  removal of polyp  . Coronary artery calcification    a. POET in 2015 with no acute findings.  . Diarrhea since 02-16-17   c dif test normal per pt  . Dyspnea     with exertion, occ at rest no oxygen or inhaler use  . GERD (gastroesophageal reflux disease)   . H/O: rheumatic fever as child  . Headache    occ left  side migraine  . Hypertension   . Lung disease    Lung nodule  . 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 Pneumonia 2016  . Rheumatoid arthritis ([place])    oa also, back arthritis, hx rheumatoid    Past Surgical History:  Procedure Laterality Date  . ABDOMINAL HYSTERECTOMY     ovaries left  . COLONOSCOPY WITH PROPOFOL N/A 03/23/2017   Procedure: COLONOSCOPY WITH PROPOFOL;  Surgeon: Arta Silence, MD;  Location: [facility];  Service: Endoscopy;  Laterality: N/A;  . colonscopy  2017  . DILATION AND CURETTAGE OF UTERUS      few done for endometriosis     Current Meds  Medication Sig  . acetaminophen (TYLENOL) 325 MG tablet Take 325 mg by mouth daily as needed for mild pain, fever or headache.  . albuterol (PROVENTIL HFA) 108 (90 Base) MCG/ACT inhaler Inhale 2 puffs into the lungs every 4 (four) hours as needed for wheezing or shortness of breath.  Marland Kitchen amLODipine (NORVASC) 5 MG tablet Take 1 tablet (5 mg total) by mouth daily.  . Menthol (HONEY LEMON COUGH DROPS MT) Use as directed 1 lozenge in the mouth or throat every 12 (twelve) hours as needed (for sore throat and cough).  . [DISCONTINUED] amLODipine (NORVASC) 5 MG tablet Take 1 tablet (5 mg total) by mouth daily.     Allergies:   Bee venom; Other; Aspirin; Erythromycin; and Penicillins   Social History   Tobacco Use  . Smoking status: Never Smoker  . Smokeless tobacco: Never Used  Substance Use Topics  . Alcohol use: No  . Drug use: No     Family Hx: The patient's family history includes Allergies in her father; Asthma in her father; Breast cancer in her mother; CAD (age of onset: 60) in her brother, father, and mother; CAD (age of onset: 46) in her brother; CAD (age of onset: 35) in her brother.  ROS:   Please see the history of present illness.    All other systems reviewed and are negative.   Prior CV studies:   The following studies were reviewed today: Cardiac CT 09/18/2018  1. Left Main: No significant stenosis.  2. LAD: No significant stenosis. 3. LCX: No significant stenosis. 4. RCA: No significant stenosis.  IMPRESSION: 1. CT FFR analysis didn't show any significant stenosis.  Labs/Other Tests and Data Reviewed:    EKG:  No ECG reviewed.  Recent Labs: 09/07/2018: BUN 13; Creatinine, Ser 0.70; Potassium 3.8; Sodium 140 09/28/2018: TSH 2.670   Recent Lipid Panel Lab Results  Component Value Date/Time   CHOL 228 (H) 02/06/2016 09:30 AM   TRIG 93 02/06/2016 09:30 AM   HDL 69 02/06/2016 09:30 AM   CHOLHDL 3.3 02/06/2016  09:30 AM   LDLCALC 140 (H) 02/06/2016 09:30 AM   LDLDIRECT 135.2 08/23/2013 11:42 AM    Wt Readings from Last 3 Encounters:  12/04/18 165 lb (74.8 kg)  09/28/18 170 lb 12.8 oz (77.5 kg)  08/22/18 172 lb (78 kg)     Objective:    Vital Signs:  BP (!) 144/76   Pulse 70   Ht 4\' 11"  (1.499 m)   Wt 165 lb (74.8 kg)   BMI 33.33 kg/m    VITAL SIGNS:  reviewed GEN:  no acute distress NEURO:  alert and oriented x 3, no obvious focal deficit PSYCH:  normal affect  ASSESSMENT & PLAN:    1. Hypertension: BP has been a little elevated "above my  normal" but has been otherwise controlled. She will continue on amlodipine 2.5 mg daily. We discussed changing to low ACE inhibitor, vs adding low dose diuretic for prn use.  With shared decision making we will try the low dose diuretic, HCTZ 12.5 mg daily for two days and then prn for edema. Will repeat labs in 4 days. She is also to consider wearing support hose. Will follow up for her response to medications. She is to adhere to a low sodium diet.  2.GERD: Continues on PPI for this and still has some symptoms but not severe.   3. CKD: Last labs revealed Creatinine of 0.70 on 09/28/2018.Repeating labs in 4 days.   COVID-19 Education: The signs and symptoms of COVID-19 were discussed with the patient and how to seek care for testing (follow up with PCP or arrange E-visit).  The importance of social distancing was discussed today.  Time:   Today, I have spent 20 minutes with the patient with telehealth technology discussing the above problems.     Medication Adjustments/Labs and Tests Ordered: Current medicines are reviewed at length with the patient today.  Concerns regarding medicines are outlined above.   Tests Ordered: Orders Placed This Encounter  Procedures  . Basic metabolic panel    Medication Changes: Meds ordered this encounter  Medications  . amLODipine (NORVASC) 5 MG tablet    Sig: Take 1 tablet (5 mg total) by mouth daily.     Dispense:  90 tablet    Refill:  1  . hydrochlorothiazide (MICROZIDE) 12.5 MG capsule    Sig: Take 1 capsule (12.5 mg total) by mouth daily as needed (LE swelling). Take 1 tab x2days then take prn    Dispense:  30 capsule    Refill:  1    Disposition:  Follow up  3 months or sooner if she is symptomatic. She will call us about her response to her medications.  Signed, Phill Myron. West Pugh, ANP, AACC  12/04/2018 11:32 AM    [facility]

## 2018-12-04 ENCOUNTER — Telehealth (INDEPENDENT_AMBULATORY_CARE_PROVIDER_SITE_OTHER): Payer: Medicaid Other | Admitting: Adult Health

## 2018-12-04 VITALS — BP 144/76 | HR 70 | Ht 59.0 in | Wt 165.0 lb

## 2018-12-04 DIAGNOSIS — I10 Essential (primary) hypertension: Secondary | ICD-10-CM

## 2018-12-04 DIAGNOSIS — Z79899 Other long term (current) drug therapy: Secondary | ICD-10-CM | POA: Diagnosis not present

## 2018-12-04 DIAGNOSIS — I872 Venous insufficiency (chronic) (peripheral): Secondary | ICD-10-CM

## 2018-12-04 DIAGNOSIS — R600 Localized edema: Secondary | ICD-10-CM

## 2018-12-04 MED ORDER — AMLODIPINE BESYLATE 5 MG PO TABS: 5.0000 mg | ORAL_TABLET | Freq: Every day | ORAL | 1 refills | Status: DC

## 2018-12-04 MED ORDER — HYDROCHLOROTHIAZIDE 12.5 MG PO CAPS: 12.5000 mg | ORAL_CAPSULE | Freq: Every day | ORAL | 1 refills | Status: DC | PRN

## 2018-12-04 NOTE — Patient Instructions (Signed)
Medication Instructions:  Start hydrochlorothiazide 12.5mg  for 2 days they as needed for swelling thereafter If you need a refill on your cardiac medications before your next appointment, please call your pharmacy.  Labwork: BMET ON FRIDAY HERE IN OUR OFFICE AT [facility] will NOT need to fast   Take the provided lab slips with you to the lab for your blood draw.   When you have your labs (blood work) drawn today and your tests are completely normal, you will receive your results only by MyChart Message (if you have MyChart) -OR-  A paper copy in the mail.  If you have any lab test that is abnormal or we need to change your treatment, we will call you to review these results.  Special Instructions: PLEASE CALL WITH ANY ADDITIONAL SWELLING AFTER THE FIRST 2 DAYS  Follow-Up: You will need a follow up appointment KEEP SCHEDULED APPOINTMENT WITH  Minus Breeding, MD or one of the following Advanced Practice Providers on your designated Care Team:  Rosaria Ferries, PA-C  Jory Sims, DNP, ANP      At [facility], you and your health needs are our priority.  As part of our continuing mission to provide you with exceptional heart care, we have created designated Provider Care Teams.  These Care Teams include your primary Cardiologist (physician) and Advanced Practice Providers (APPs -  Physician Assistants and Nurse Practitioners) who all work together to provide you with the care you need, when you need it.  Thank you for choosing [facility] at [facility]!!

## 2018-12-08 LAB — BASIC METABOLIC PANEL
BUN/Creatinine Ratio: 22 (ref 12–28)
BUN: 14 mg/dL (ref 8–27)
CO2: 24 mmol/L (ref 20–29)
Calcium: 9.5 mg/dL (ref 8.7–10.3)
Chloride: 104 mmol/L (ref 96–106)
Creatinine, Ser: 0.63 mg/dL (ref 0.57–1.00)
GFR calc Af Amer: 111 mL/min/{1.73_m2} (ref >59)
GFR calc non Af Amer: 96 mL/min/{1.73_m2} (ref >59)
Glucose: 95 mg/dL (ref 65–99)
Potassium: 3.3 mmol/L — ABNORMAL LOW (ref 3.5–5.2)
Sodium: 143 mmol/L (ref 134–144)

## 2018-12-11 ENCOUNTER — Other Ambulatory Visit: Payer: Self-pay

## 2018-12-11 MED ORDER — POTASSIUM CHLORIDE ER 10 MEQ PO TBCR: 10.0000 meq | EXTENDED_RELEASE_TABLET | Freq: Every day | ORAL | 3 refills | Status: DC

## 2018-12-14 ENCOUNTER — Telehealth: Payer: Self-pay | Admitting: Cardiology

## 2018-12-14 MED ORDER — POTASSIUM CHLORIDE ER 10 MEQ PO TBCR: 20.0000 meq | EXTENDED_RELEASE_TABLET | Freq: Every day | ORAL | 3 refills | Status: DC

## 2018-12-14 NOTE — Addendum Note (Signed)
Addended by: Waylan Rocher on: 12/14/2018 03:21 PM   Modules accepted: Orders

## 2018-12-14 NOTE — Telephone Encounter (Signed)
Patient states she is returning a call in regards to her medications and that is was possible Kendra Gilbert that called her. I could not find a phone note

## 2018-12-19 ENCOUNTER — Telehealth: Payer: Self-pay | Admitting: Cardiology

## 2018-12-19 ENCOUNTER — Other Ambulatory Visit: Payer: Self-pay

## 2018-12-19 DIAGNOSIS — Z79899 Other long term (current) drug therapy: Secondary | ICD-10-CM

## 2018-12-19 LAB — BASIC METABOLIC PANEL
BUN/Creatinine Ratio: 23 (ref 12–28)
BUN: 15 mg/dL (ref 8–27)
CO2: 25 mmol/L (ref 20–29)
Calcium: 9.5 mg/dL (ref 8.7–10.3)
Chloride: 100 mmol/L (ref 96–106)
Creatinine, Ser: 0.66 mg/dL (ref 0.57–1.00)
GFR calc Af Amer: 109 mL/min/{1.73_m2} (ref >59)
GFR calc non Af Amer: 95 mL/min/{1.73_m2} (ref >59)
Glucose: 92 mg/dL (ref 65–99)
Potassium: 4.2 mmol/L (ref 3.5–5.2)
Sodium: 140 mmol/L (ref 134–144)

## 2018-12-19 NOTE — Telephone Encounter (Signed)
New message:    Patient stating that some called her today. Please call patient back.

## 2018-12-19 NOTE — Telephone Encounter (Signed)
Returned call to patient she stated she was returning a call from [place].Advised I will send message to [facility].

## 2018-12-20 NOTE — Telephone Encounter (Signed)
I DID NOT CALL PT WILL CALL WHEN RESULTS COME BACK

## 2018-12-25 ENCOUNTER — Encounter (HOSPITAL_COMMUNITY): Payer: Self-pay | Admitting: Emergency Medicine

## 2018-12-25 ENCOUNTER — Emergency Department (HOSPITAL_COMMUNITY)
Admission: EM | Admit: 2018-12-25 | Discharge: 2018-12-25 | Disposition: A | Discharge status: Home / Self Care | Payer: Medicaid Other | Attending: Emergency Medicine | Admitting: Emergency Medicine

## 2018-12-25 ENCOUNTER — Emergency Department (HOSPITAL_COMMUNITY): Payer: Medicaid Other

## 2018-12-25 ENCOUNTER — Other Ambulatory Visit: Payer: Self-pay

## 2018-12-25 DIAGNOSIS — M79671 Pain in right foot: Secondary | ICD-10-CM | POA: Diagnosis present

## 2018-12-25 DIAGNOSIS — I10 Essential (primary) hypertension: Secondary | ICD-10-CM | POA: Insufficient documentation

## 2018-12-25 DIAGNOSIS — Z79899 Other long term (current) drug therapy: Secondary | ICD-10-CM | POA: Insufficient documentation

## 2018-12-25 DIAGNOSIS — M19079 Primary osteoarthritis, unspecified ankle and foot: Secondary | ICD-10-CM

## 2018-12-25 NOTE — ED Provider Notes (Signed)
[facility] DEPT Provider Note   CSN: [id number] Arrival date & time: 12/25/18  3500     History   Chief Complaint Chief Complaint  Patient presents with  . Foot Pain    right    HPI Kendra Gilbert is a 63 y.o. female who presents the emergency department chief complaint of right foot pain.  Patient states that she feels like there is "something under my toe.  She feels a painful sensation under the right second toe that feels like there she is walking on a rock.  The pain began 2 weeks ago.  She has pain with ambulation.  Is better at rest.  She intermittently feels some numbness in the toe generally after walking.  She does have a history of degenerative changes of the lumbar spine but denies radiating pain down the leg.  She denies weakness, calf pain, unilateral leg swelling, chest pain, shortness of breath.     HPI  Past Medical History:  Diagnosis Date  . Asthma   . Chronic kidney disease    cyst in left kidney  . Colon cancer ([place]) 2017   surgical tx  removal of polyp  . Coronary artery calcification    a. POET in 2015 with no acute findings.  . Diarrhea since 02-16-17   c dif test normal per pt  . Dyspnea     with exertion, occ at rest no oxygen or inhaler use  . GERD (gastroesophageal reflux disease)   . H/O: rheumatic fever as child  . Headache    occ left side migraine  . Hypertension   . Lung disease    Lung nodule  . 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 Pneumonia 2016  . Rheumatoid arthritis ([place])    oa also, back arthritis, hx rheumatoid     Patient Active Problem List   Diagnosis Date Noted  . Chest pain 02/17/2016  . Dyslipidemia 02/17/2016  . Family history of heart disease   . Dyspnea 01/20/2016  . Multiple pulmonary nodules determined by computed tomography of lung 01/20/2016  . Coronary artery calcification 08/23/2013  . Upper airway cough syndrome 08/19/2013  . Exertional chest  pain 08/19/2013    Past Surgical History:  Procedure Laterality Date  . ABDOMINAL HYSTERECTOMY     ovaries left  . COLONOSCOPY WITH PROPOFOL N/A 03/23/2017   Procedure: COLONOSCOPY WITH PROPOFOL;  Surgeon: Arta Silence, MD;  Location: [facility];  Service: Endoscopy;  Laterality: N/A;  . colonscopy  2017  . DILATION AND CURETTAGE OF UTERUS     few done for endometriosis     OB History   No obstetric history on file.      Home Medications    Prior to Admission medications   Medication Sig Start Date End Date Taking? Authorizing Provider  acetaminophen (TYLENOL) 325 MG tablet Take 325 mg by mouth daily as needed for mild pain, fever or headache.    [provider]  albuterol (PROVENTIL HFA) 108 (90 Base) MCG/ACT inhaler Inhale 2 puffs into the lungs every 4 (four) hours as needed for wheezing or shortness of breath.    [provider]  amLODipine (NORVASC) 5 MG tablet Take 1 tablet (5 mg total) by mouth daily. 12/04/18 03/04/19  Lendon Colonel, NP  hydrochlorothiazide (MICROZIDE) 12.5 MG capsule Take 1 capsule (12.5 mg total) by mouth daily as needed (LE swelling). Take 1 tab x2days then take prn 12/04/18 03/04/19  Jory Sims  M, NP  Menthol (HONEY LEMON COUGH DROPS MT) Use as directed 1 lozenge in the mouth or throat every 12 (twelve) hours as needed (for sore throat and cough).    [provider]  potassium chloride (K-DUR) 10 MEQ tablet Take 2 tablets (20 mEq total) by mouth daily. 12/14/18 03/14/19  Lendon Colonel, NP    Family History Family History  Problem Relation Age of Onset  . CAD Mother 72       CABG/stents  . Breast cancer Mother   . Asthma Father   . CAD Father 72  . Allergies Father   . CAD Brother 81       Died with MI  . CAD Brother 13       9 stents  . CAD Brother 25       7 stents    Social History Social History   Tobacco Use  . Smoking status: Never Smoker  . Smokeless tobacco: Never Used  Substance Use  Topics  . Alcohol use: No  . Drug use: No     Allergies   [facility] venom, Other, Aspirin, Erythromycin, and Penicillins   Review of Systems Review of Systems  Ten systems reviewed and are negative for acute change, except as noted in the HPI.   Physical Exam Updated Vital Signs BP (!) 148/82 (BP Location: Left Arm)   Pulse 88   Temp 98.4 F (36.9 C) (Oral)   Resp 18   SpO2 100%   Physical Exam  Physical Exam  Nursing note and vitals reviewed. Constitutional: She is oriented to person, place, and time. She appears well-developed and well-nourished. No distress.  HENT:  Head: Normocephalic and atraumatic.  Eyes: Conjunctivae normal and EOM are normal. Pupils are equal, round, and reactive to light. No scleral icterus.  Neck: Normal range of motion.  Cardiovascular: Normal rate, regular rhythm and normal heart sounds.  Exam reveals no gallop and no friction rub.   No murmur heard. Pulmonary/Chest: Effort normal and breath sounds normal. No respiratory distress.  Abdominal: Soft. Bowel sounds are normal. She exhibits no distension and no mass. There is no tenderness. There is no guarding.  Neurological: She is alert and oriented to person, place, and time.  Musculoskeletal: The right foot examination shows no obvious deformities or abnormalities.  She has some mild tenderness on the plantar surface of her right second toe.  Normal capillary refill, full range of motion of the toes, normal strength, full range of motion of the ipsilateral ankle.  DP and PT pulse 2+.  No unilateral leg swelling, calf compartments soft and nontender capillary refill less than 2 seconds in the toes. Skin: Skin is warm and dry. She is not diaphoretic.  Psych: Anxious  ED Treatments / Results  Labs (all labs ordered are listed, but only abnormal results are displayed) Labs Reviewed - No data to display  EKG    Radiology Dg Foot Complete Right  Result Date: 12/25/2018 CLINICAL DATA:  Right foot  pain for 2 weeks. EXAM: RIGHT FOOT COMPLETE - 3+ VIEW COMPARISON:  No recent. FINDINGS: Degenerative change. Degenerative changes most prior about the first metatarsophalangeal joint. No acute bony or joint abnormality identified. No evidence of fracture or dislocation. IMPRESSION: No acute abnormality. Diffuse degenerative change. Degenerative changes most prominent at the first metatarsophalangeal joint. Electronically Signed   By: Marcello Moores  Register   On: 12/25/2018 08:47    Procedures Procedures (including critical care time)  Medications Ordered in ED Medications -  No data to display   Initial Impression / Assessment and Plan / ED Course  I have reviewed the triage vital signs and the nursing notes.  Pertinent labs & imaging results that were available during my care of the patient were reviewed by me and considered in my medical decision making (see chart for details).        Patient with foot pain. She has no obvious abnormalities on the foot.  Differential diagnosis includes injury, gout, septic joint, Morton's neuroma, plantar wart, callus, arthritic changes.  I personally reviewed the patient's 2 view x-ray which shows some arthritic changes and no obvious abnormalities.  Not feel that the patient has any life limb or organ threatening cause of pain to the foot.  She has excellent circulation, normal sensation.  Patient will be referred to outpatient podiatry for further evaluation of her foot.  She may use ice, Tylenol or anti-inflammatories.  Final Clinical Impressions(s) / ED Diagnoses   Final diagnoses:  None    ED Discharge Orders    None       Margarita Mail, PA-C 12/25/18 0933    Francine Graven, DO 12/28/18 1056

## 2018-12-25 NOTE — ED Triage Notes (Signed)
Pt c/o rigth foot pain for 2 weeks. States feels like there is a pebble in her foot and hurts with ambulation. Now her right knee and hip are hurting due to compensating for that foot.

## 2018-12-25 NOTE — Discharge Instructions (Signed)
Your x-ray showed some age-related arthritic changes in the foot.  There are no other obvious abnormalities seen on the x-ray. Return immediately to the emergency department for the following reasons Your foot or toes are significantly swollen in a new way. Your foot or toes turn white or blue. You have warmth and redness along your foot. You have severe unilateral leg pain and swelling, chest pain or shortness of breath You have a fever

## 2019-01-01 ENCOUNTER — Encounter: Payer: Self-pay | Admitting: Podiatry

## 2019-01-01 ENCOUNTER — Ambulatory Visit: Payer: Medicaid Other | Admitting: Podiatry

## 2019-01-01 ENCOUNTER — Ambulatory Visit (INDEPENDENT_AMBULATORY_CARE_PROVIDER_SITE_OTHER): Payer: Medicaid Other

## 2019-01-01 ENCOUNTER — Other Ambulatory Visit: Payer: Self-pay

## 2019-01-01 ENCOUNTER — Other Ambulatory Visit: Payer: Self-pay | Admitting: Podiatry

## 2019-01-01 VITALS — Temp 97.9°F

## 2019-01-01 DIAGNOSIS — M65972 Unspecified synovitis and tenosynovitis, left ankle and foot: Secondary | ICD-10-CM

## 2019-01-01 DIAGNOSIS — M659 Synovitis and tenosynovitis, unspecified: Secondary | ICD-10-CM

## 2019-01-01 DIAGNOSIS — M7751 Other enthesopathy of right foot: Secondary | ICD-10-CM

## 2019-01-01 DIAGNOSIS — M79671 Pain in right foot: Secondary | ICD-10-CM

## 2019-01-01 DIAGNOSIS — M19072 Primary osteoarthritis, left ankle and foot: Secondary | ICD-10-CM

## 2019-01-02 ENCOUNTER — Telehealth: Payer: Self-pay | Admitting: Podiatry

## 2019-01-02 ENCOUNTER — Telehealth: Payer: Self-pay

## 2019-01-02 NOTE — Telephone Encounter (Signed)
After injection pt is breaking out with Hives on chest and back.

## 2019-01-02 NOTE — Telephone Encounter (Signed)
Spoke with patient in regards to her reaction to the injection, she states that she broke out today with hives on her chest and she is experiencing some itching.  She denies shortness of breath, chest pain, denies throat tightness, overall she denies any anaphylactic symptoms at this time.  I did advise her to start taking Benadryl as directed, she is to call back if her symptoms do not improve.  She was also advised to go to the emergency room if she experiences any worsening symptoms.

## 2019-01-03 NOTE — Progress Notes (Signed)
   Subjective:  63-year-old female presenting today as a new patient with a chief complaint of progressively worsening pain to the bilateral feet that has been present for the past few months. She reports pain in the 2nd toe of the right foot as well. Applying pressure to the feet causes sharp, shooting pain that radiates to her knees. She was seen in the ED on 12/25/2018 and had X-Rays done that showed degenerative changes. She has not taken anything for treatment. Patient is here for further evaluation and treatment.   Past Medical History:  Diagnosis Date  . Asthma   . Chronic kidney disease    cyst in left kidney  . Colon cancer ([facility]) 2017   surgical tx  removal of polyp  . Coronary artery calcification    a. POET in 2015 with no acute findings.  . Diarrhea since 02-16-17   c dif test normal per pt  . Dyspnea     with exertion, occ at rest no oxygen or inhaler use  . GERD (gastroesophageal reflux disease)   . H/O: rheumatic fever as child  . Headache    occ left side migraine  . Hypertension   . Lung disease    Lung nodule  . 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 Pneumonia 2016  . Rheumatoid arthritis ([facility])    oa also, back arthritis, hx rheumatoid     Objective / Physical Exam:  General:  The patient is alert and oriented x3 in no acute distress. Dermatology:  Skin is warm, dry and supple bilateral lower extremities. Negative for open lesions or macerations. Vascular:  Palpable pedal pulses bilaterally. No edema or erythema noted. Capillary refill within normal limits. Neurological:  Epicritic and protective threshold grossly intact bilaterally.  Musculoskeletal Exam:  Pain on palpation to the anterior lateral medial aspects of the patient's left ankle. Mild edema noted. Pain with palpation noted to the 2nd MPJ of the right foot.  Range of motion within normal limits to all pedal and ankle joints bilateral. Muscle strength 5/5 in all groups  bilateral.   Assessment: 1. 2nd MPJ capsulitis right 2. DJD / synovitis left ankle  Plan of Care:  1. Patient was evaluated. X-Rays from ED on 12/25/2018 reviewed.  2. injection of 0.5 mL Celestone Soluspan injected in the 2nd MPJ of the right foot. 3. Met pads dispensed.  4. Prescription for topical antiinflammatory cream from [facility] in [place] ordered.  5. Ankle brace provided for the left ankle.  6. Cannot tolerate oral medications due to GI and kidney complications.  7. Return to clinic in 4 weeks.   Brent M. Evans, DPM [facility]  Dr. Brent M. Evans, DPM    [address]                                        [place], [place] [address]                Office [phone]  Fax [phone]        

## 2019-01-08 ENCOUNTER — Telehealth: Payer: Self-pay | Admitting: Podiatry

## 2019-01-08 NOTE — Telephone Encounter (Signed)
Pt received an interjection in her right foot and she had an allergic reaction. Please call patient.

## 2019-01-08 NOTE — Telephone Encounter (Addendum)
I called pt and she stated she still have the hives all over her body and is taking benadryl, but is she stops the benadryl the hives return. I told pt I would inform Dr. Amalia Hailey, but the continuation of the hive may be related to another medication she is taking, and she should see her PCP. Pt states she had a reaction like this in [place], and was given a shot, and they felt it may be related to heat.

## 2019-01-08 NOTE — Telephone Encounter (Signed)
Left message for pt to call.

## 2019-01-09 ENCOUNTER — Telehealth: Payer: Self-pay | Admitting: Adult Health

## 2019-01-09 NOTE — Telephone Encounter (Signed)
Pt has been having hives since foot injection from pediatrist on 01/01/2019, please review many phone conversations to pediatrist.  Pt wants to know if the hives can be from her 3 current meds for HTN? She wants to know if she should discontinue meds to see if that is why she is continuing to break out in hives. Pt states she gets heat hives and has a delicate system. Pt was told that I will forward msg to DR/PA and nurse for further advice.

## 2019-01-09 NOTE — Telephone Encounter (Signed)
Call went to VM, left msg for her to return call if there is further need. Number provided.

## 2019-01-09 NOTE — Telephone Encounter (Signed)
Have her check with Pediatrist about this since it occurred after her injection. She can take Benadryl for symptoms of hives.

## 2019-01-09 NOTE — Telephone Encounter (Signed)
Call went straight to VM, left msg I had reviewed pediatrist's notes and I would try to reach out again in 30 minutes or she could call back if need/ number provided.

## 2019-01-09 NOTE — Telephone Encounter (Signed)
The only new medication is HCTZ that was prescribed in early June. She can try stopping it and see if this helps. This was not on her allergy list. Let's see if this helps.

## 2019-01-09 NOTE — Telephone Encounter (Signed)
Fyi, Per pediatrist notes, pt is on benadryl.

## 2019-01-09 NOTE — Telephone Encounter (Signed)
Follow up ° ° °Patient is returning your call. Please call. ° ° ° °

## 2019-01-09 NOTE — Telephone Encounter (Signed)
New message:    Patient had a appt with her foot doctor and had a shot in her foot after taking the new medication she was on patient broke out in hives. Please call patient.

## 2019-01-10 NOTE — Telephone Encounter (Signed)
Advised patient, verbalized understanding  

## 2019-01-10 NOTE — Addendum Note (Signed)
Addended by: Alvina Filbert B on: 01/10/2019 03:27 PM   Modules accepted: Orders

## 2019-01-10 NOTE — Telephone Encounter (Signed)
Left message to call back  

## 2019-01-10 NOTE — Telephone Encounter (Signed)
Spoke with patient and reviewed recommendations. She was asking if she should continue with the K+ 20 meq daily  Will forward to [place] for review

## 2019-01-10 NOTE — Telephone Encounter (Signed)
She is to take potassium with she takes HCTZ. If she is stopping the HCTZ for allergic reaction, she can stop potassium.  If she is not seeing improvement of rash with cessation of HCTZ she is to start this back with potassium.

## 2019-01-16 DIAGNOSIS — M8949 Other hypertrophic osteoarthropathy, multiple sites: Secondary | ICD-10-CM | POA: Insufficient documentation

## 2019-01-16 DIAGNOSIS — M159 Polyosteoarthritis, unspecified: Secondary | ICD-10-CM | POA: Insufficient documentation

## 2019-01-16 DIAGNOSIS — M797 Fibromyalgia: Secondary | ICD-10-CM | POA: Insufficient documentation

## 2019-01-25 ENCOUNTER — Telehealth: Payer: Self-pay | Admitting: Adult Health

## 2019-01-25 NOTE — Telephone Encounter (Signed)
Spoke with pt, she was given the HCTZ for swelling. She reports swelling all day and worse by the evening. She has some SOB with exertion and doing laundry, denies orthopnea. Sodium and fluid restrictions discussed. Patient has an appointment 02-16-2019 with dr hochrein. Will forward to dr hochrein to review and advise.

## 2019-01-25 NOTE — Telephone Encounter (Signed)
Patient is calling to state to has stop taking two medication as direct as of two weeks ago (01/11/19) she wants to know if she should start them again or just continue with just her BP medication.  She had stopped taking the potassium and HCTZ because she had gotten hives.  She states she no longer has the hives.

## 2019-01-28 NOTE — Telephone Encounter (Signed)
Sounds like she will need to move up her appt with an APP.  Call Ms. Towner with the results and send results to Leighton Ruff, MD

## 2019-01-29 NOTE — Telephone Encounter (Signed)
Left message for pt to call.

## 2019-01-30 ENCOUNTER — Telehealth: Payer: Self-pay | Admitting: Adult Health

## 2019-01-30 NOTE — Telephone Encounter (Signed)

## 2019-01-30 NOTE — Telephone Encounter (Signed)
I rescheduled patient's appt sooner with Jory Sims for 7/29.

## 2019-01-30 NOTE — Telephone Encounter (Signed)
LVM, asking pt to call and be pre-screened for COVID-19.

## 2019-01-31 ENCOUNTER — Encounter: Payer: Self-pay | Admitting: Adult Health

## 2019-01-31 ENCOUNTER — Ambulatory Visit (INDEPENDENT_AMBULATORY_CARE_PROVIDER_SITE_OTHER): Payer: Medicaid Other | Admitting: Adult Health

## 2019-01-31 ENCOUNTER — Other Ambulatory Visit: Payer: Self-pay

## 2019-01-31 VITALS — BP 127/81 | HR 68 | Temp 97.5°F | Ht 59.0 in | Wt 175.0 lb

## 2019-01-31 DIAGNOSIS — R600 Localized edema: Secondary | ICD-10-CM

## 2019-01-31 DIAGNOSIS — I2510 Atherosclerotic heart disease of native coronary artery without angina pectoris: Secondary | ICD-10-CM | POA: Diagnosis not present

## 2019-01-31 DIAGNOSIS — Z79899 Other long term (current) drug therapy: Secondary | ICD-10-CM | POA: Diagnosis not present

## 2019-01-31 DIAGNOSIS — I2584 Coronary atherosclerosis due to calcified coronary lesion: Secondary | ICD-10-CM | POA: Diagnosis not present

## 2019-01-31 DIAGNOSIS — I10 Essential (primary) hypertension: Secondary | ICD-10-CM

## 2019-01-31 MED ORDER — LISINOPRIL 5 MG PO TABS: 5.0000 mg | ORAL_TABLET | Freq: Every day | ORAL | 3 refills | Status: DC

## 2019-01-31 NOTE — Patient Instructions (Addendum)
Medication Instructions:  STOP- Amlodipine START- Lisinopril 5 mg by mouth daily  If you need a refill on your cardiac medications before your next appointment, please call your pharmacy.  Labwork: BMP in 10 days HERE IN OUR OFFICE AT [facility] You will NOT need to fast   Take the provided lab slips with you to the lab for your blood draw.   When you have your labs (blood work) drawn today and your tests are completely normal, you will receive your results only by MyChart Message (if you have MyChart) -OR-  A paper copy in the mail.  If you have any lab test that is abnormal or we need to change your treatment, we will call you to review these results.  Testing/Procedures: None Ordered  Follow-Up: . Your physician recommends that you schedule a follow-up appointment in: 2 weeks August 12th @ 8:45 am  At [facility], you and your health needs are our priority.  As part of our continuing mission to provide you with exceptional heart care, we have created designated Provider Care Teams.  These Care Teams include your primary Cardiologist (physician) and Advanced Practice Providers (APPs -  Physician Assistants and Nurse Practitioners) who all work together to provide you with the care you need, when you need it.  Thank you for choosing [facility] at [facility]!!

## 2019-01-31 NOTE — Progress Notes (Signed)
Cardiology Office Note   Date:  01/31/2019   ID:  Kendra, Gilbert [birth year], MRN [id number]  PCP:  Leighton Ruff, MD  Cardiologist:  Dr. Percival Spanish  Chief complaint: LEE    History of Present Illness: Kendra Gilbert is a 63 y.o. female who presents for ongoing assessment and management of hypertension and chronic chest pain.  Cardiac CTA September 18, 2018 did not reveal any significant stenosis of any main artery.  Patient also has a history of chronic dyspnea, chronic kidney disease with cyst in the left kidney, and GERD.  She was last seen in the office on 12/04/2018 with elevated blood pressure and therefore amlodipine 2.5 was continued.  She was started on HCTZ 12.5 mg as needed for edema and was advised to wear support hose.  She was to take potassium supplement with HCTZ.  She was seen in the emergency room on 12/25/2018 with right foot pain.  She had no obvious abnormalities of the foot.  She was referred to outpatient podiatry for further evaluation as she had excellent circulation and normal sensation.  She had an injection in her foot on 01/01/2019.  She had hives which started shortly thereafter.  She called our office asking if the HCTZ may have been causing her to have those symptoms.  She was asked to stop the HCTZ to see if she got better and to take Benadryl for symptomatic relief.  After stopping HCTZ she no longer had allergic hives, and had improvement in her symptoms.  She called back to our office on 01/25/2019 with complaints of lower extremity edema with shortness of breath with exertion doing laundry.  As a result of these recurrent symptoms she was advised to be seen in the office today.  She comes today with complaints of heaviness in her legs and continued lower extremity edema.  She states that when her legs are more swollen her legs feel weaker.  She has stopped taking HCTZ and her rash has cleared up.  She still has some papular rash on the medial lateral ankles.   She is still to follow-up with primary care and podiatrist.  Past Medical History:  Diagnosis Date  . Asthma   . Chronic kidney disease    cyst in left kidney  . Colon cancer ([place]) 2017   surgical tx  removal of polyp  . Coronary artery calcification    a. POET in 2015 with no acute findings.  . Diarrhea since 02-16-17   c dif test normal per pt  . Dyspnea     with exertion, occ at rest no oxygen or inhaler use  . GERD (gastroesophageal reflux disease)   . H/O: rheumatic fever as child  . Headache    occ left side migraine  . Hypertension   . Lung disease    Lung nodule  . Non-alcoholic cirrhosis ([place]) 2725   no current issues with  . Pneumonia 2016  . Rheumatoid arthritis ([place])    oa also, back arthritis, hx rheumatoid     Past Surgical History:  Procedure Laterality Date  . ABDOMINAL HYSTERECTOMY     ovaries left  . COLONOSCOPY WITH PROPOFOL N/A 03/23/2017   Procedure: COLONOSCOPY WITH PROPOFOL;  Surgeon: Arta Silence, MD;  Location: [facility];  Service: Endoscopy;  Laterality: N/A;  . colonscopy  2017  . DILATION AND CURETTAGE OF UTERUS     few done for endometriosis     Current Outpatient Medications  Medication Sig Dispense Refill  . acetaminophen (TYLENOL)  325 MG tablet Take 325 mg by mouth daily as needed for mild pain, fever or headache.    . albuterol (PROVENTIL HFA) 108 (90 Base) MCG/ACT inhaler Inhale 2 puffs into the lungs every 4 (four) hours as needed for wheezing or shortness of breath.    Marland Kitchen amLODipine (NORVASC) 5 MG tablet Take 1 tablet (5 mg total) by mouth daily. 90 tablet 1  . NONFORMULARY OR COMPOUNDED ITEM [facility]  Diclofenac, Baclofen, Gabapentin, Lidocaine, Menthol 100mg  2 refills    . Menthol (HONEY LEMON COUGH DROPS MT) Use as directed 1 lozenge in the mouth or throat every 12 (twelve) hours as needed (for sore throat and cough).     No current facility-administered medications for this visit.     Allergies:   Bee venom,  Other, Aspirin, Erythromycin, Hctz [hydrochlorothiazide], and Penicillins    Social History:  The patient  reports that she has never smoked. She has never used smokeless tobacco. She reports that she does not drink alcohol or use drugs.   Family History:  The patient's family history includes Allergies in her father; Asthma in her father; Breast cancer in her mother; CAD (age of onset: 37) in her brother, father, and mother; CAD (age of onset: 76) in her brother; CAD (age of onset: 90) in her brother.    ROS: All other systems are reviewed and negative. Unless otherwise mentioned in H&P    PHYSICAL EXAM: VS:  There were no vitals taken for this visit. , BMI There is no height or weight on file to calculate BMI. GEN: Well nourished, well developed, in no acute distress HEENT: normal Neck: no JVD, carotid bruits, or masses Cardiac: RRR; no murmurs, rubs, or gallops, 1+ edema  Respiratory: Scant inspiratory wheezes noted anteriorly.  No rales or rhonchi.  Cleared with cough. GI: soft, nontender, nondistended, + BS MS: no deformity or atrophy Skin: warm and dry, no rash Neuro:  Strength and sensation are intact Psych: euthymic mood, full affect   EKG: Normal sinus rhythm with moderate LVH ventricular rate of 69 bpm.  Recent Labs: 09/28/2018: TSH 2.670 12/19/2018: BUN 15; Creatinine, Ser 0.66; Potassium 4.2; Sodium 140    Lipid Panel    Component Value Date/Time   CHOL 228 (H) 02/06/2016 0930   TRIG 93 02/06/2016 0930   HDL 69 02/06/2016 0930   CHOLHDL 3.3 02/06/2016 0930   VLDL 19 02/06/2016 0930   LDLCALC 140 (H) 02/06/2016 0930   LDLDIRECT 135.2 08/23/2013 1142      Wt Readings from Last 3 Encounters:  12/04/18 165 lb (74.8 kg)  09/28/18 170 lb 12.8 oz (77.5 kg)  08/22/18 172 lb (78 kg)     Other studies Reviewed: None  ASSESSMENT AND PLAN:  1.  Hypertension: She does not appear to be tolerating amlodipine well and she is having significant dependent edema.  She  was unable to tolerate HCTZ as she had hives after taking this.  HCTZ was discontinued and hives subsided.  She continues to complain of dependent edema with leg weakness and heaviness.  I will discontinue amlodipine and change her to lisinopril 5 mg daily.  I will follow-up with BMET in 2 weeks and see her in the office thereafter.  I have advised her on potential side effects of ACE inhibitor to include tongue swelling, cough, and rash.  If any of these occur she is to stop it immediately and give [place] a call.  Creatinine last checked on 12/19/2018, with value of 0.66.  2.  Chronic dyspnea on exertion: She is followed by pulmonary.  She is on Proventil inhalers as needed.  She will need to continue follow-up to evaluate further if this begins to worsen or cause her to have some inactivity as a result.   Current medicines are reviewed at length with the patient today.    Labs/ tests ordered today include: BMET in 2 weeks  Phill Myron. [place] Pugh, ANP, AACC   01/31/2019 8:08 AM    [facility] [address] Office [phone] Fax [phone]

## 2019-02-05 ENCOUNTER — Ambulatory Visit: Payer: Medicaid Other | Admitting: Podiatry

## 2019-02-07 ENCOUNTER — Other Ambulatory Visit: Payer: Self-pay

## 2019-02-07 ENCOUNTER — Encounter: Payer: Self-pay | Admitting: Podiatry

## 2019-02-07 ENCOUNTER — Other Ambulatory Visit: Payer: Self-pay | Admitting: Family Medicine

## 2019-02-07 ENCOUNTER — Ambulatory Visit: Payer: Medicaid Other | Admitting: Podiatry

## 2019-02-07 VITALS — Temp 97.3°F

## 2019-02-07 DIAGNOSIS — M7751 Other enthesopathy of right foot: Secondary | ICD-10-CM | POA: Diagnosis not present

## 2019-02-07 DIAGNOSIS — Z1231 Encounter for screening mammogram for malignant neoplasm of breast: Secondary | ICD-10-CM

## 2019-02-07 DIAGNOSIS — M659 Synovitis and tenosynovitis, unspecified: Secondary | ICD-10-CM

## 2019-02-07 DIAGNOSIS — M76822 Posterior tibial tendinitis, left leg: Secondary | ICD-10-CM

## 2019-02-07 DIAGNOSIS — M19072 Primary osteoarthritis, left ankle and foot: Secondary | ICD-10-CM

## 2019-02-09 LAB — BASIC METABOLIC PANEL
BUN/Creatinine Ratio: 26 (ref 12–28)
BUN: 17 mg/dL (ref 8–27)
CO2: 23 mmol/L (ref 20–29)
Calcium: 9.4 mg/dL (ref 8.7–10.3)
Chloride: 105 mmol/L (ref 96–106)
Creatinine, Ser: 0.66 mg/dL (ref 0.57–1.00)
GFR calc Af Amer: 109 mL/min/{1.73_m2} (ref >59)
GFR calc non Af Amer: 95 mL/min/{1.73_m2} (ref >59)
Glucose: 74 mg/dL (ref 65–99)
Potassium: 3.8 mmol/L (ref 3.5–5.2)
Sodium: 143 mmol/L (ref 134–144)

## 2019-02-10 NOTE — Progress Notes (Signed)
   Subjective:  63 year old female presenting today as for follow up evaluation of 2nd MPJ capsulitis of the right foot and left ankle pain. She states the pain in the right foot became worse for the two weeks following the injection. She states it is now better but still causes pain when pressure is applied to the foot. She states the left ankle still feels weak. She reports a change in her blood pressure medication secondary to swelling everywhere, including the left ankle which caused more discomfort making it difficult to use the ankle brace. Patient is here for further evaluation and treatment.   Past Medical History:  Diagnosis Date  . Asthma   . Chronic kidney disease    cyst in left kidney  . Colon cancer ([place]) 2017   surgical tx  removal of polyp  . Coronary artery calcification    a. POET in 2015 with no acute findings.  . Diarrhea since 02-16-17   c dif test normal per pt  . Dyspnea     with exertion, occ at rest no oxygen or inhaler use  . GERD (gastroesophageal reflux disease)   . H/O: rheumatic fever as child  . Headache    occ left side migraine  . Hypertension   . Lung disease    Lung nodule  . 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 Pneumonia 2016  . Rheumatoid arthritis ([place])    oa also, back arthritis, hx rheumatoid     Objective / Physical Exam:  General:  The patient is alert and oriented x3 in no acute distress. Dermatology:  Skin is warm, dry and supple bilateral lower extremities. Negative for open lesions or macerations. Vascular:  Palpable pedal pulses bilaterally. No edema or erythema noted. Capillary refill within normal limits. Neurological:  Epicritic and protective threshold grossly intact bilaterally.  Musculoskeletal Exam:  Pain on palpation to the anterior lateral medial aspects of the patient's left ankle. Mild edema noted. Pain with palpation noted to the 2nd MPJ of the right foot.  Pain on palpation noted to the  posterior tibial tendon of the left foot.  Range of motion within normal limits to all pedal and ankle joints bilateral. Muscle strength 5/5 in all groups bilateral.   Assessment: 1. 2nd MPJ capsulitis right - improved 2. DJD / synovitis left ankle 3. Posterior tibial tendinitis left   Plan of Care:  1. Patient was evaluated.  2. injection of 0.5 mL Celestone Soluspan injected in the posterior tibial tendon sheath of the left foot.  3. Patient did not get the topical medication from [place].  4. Compression anklet dispensed for the left ankle. Patient could not tolerate the ankle brace due to swelling.  5. Recommended good shoe gear.  6. Cannot tolerate oral medications due to GI and kidney complications.  7. Return to clinic as needed.   Edrick Kins, DPM [facility]  Dr. Edrick Kins, [address]                                        [place], [place] [address]                Office [phone]  Fax [phone]

## 2019-02-13 NOTE — Progress Notes (Signed)
Cardiology Office Note   Date:  02/13/2019   ID:  Kendra, Gilbert [birth year], MRN [id number]  PCP:  Leighton Ruff, MD  Cardiologist:  Dr Percival Spanish No chief complaint on file.    History of Present Illness: Kendra Gilbert is a 63 y.o.obese Caucasian female who presents for follow up after low dose lisinopril was added for HTN.  The patient has a history of "medication sensitivity".   She had a rash with HCTZ and edema with Amlodipine.  She sees multiple specialist for chronic dyspnea, abdominal discomfort, and most recently foot pain. She seems to be tolerating lisinopril 5 mg daily, she did say she has noticed some increased abdominal discomfort.  I told her I doubted that was secondary to lisinopril.  She also complains of swelling in her feet that worse at the end of the day.  She has minimal swelling today on exam.    Past Medical History:  Diagnosis Date  . Asthma   . Chronic kidney disease    cyst in left kidney  . Colon cancer ([place]) 2017   surgical tx  removal of polyp  . Coronary artery calcification    a. POET in 2015 with no acute findings.  . Diarrhea since 02-16-17   c dif test normal per pt  . Dyspnea     with exertion, occ at rest no oxygen or inhaler use  . GERD (gastroesophageal reflux disease)   . H/O: rheumatic fever as child  . Headache    occ left side migraine  . Hypertension   . Lung disease    Lung nodule  . 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 Pneumonia 2016  . Rheumatoid arthritis ([place])    oa also, back arthritis, hx rheumatoid     Past Surgical History:  Procedure Laterality Date  . ABDOMINAL HYSTERECTOMY     ovaries left  . COLONOSCOPY WITH PROPOFOL N/A 03/23/2017   Procedure: COLONOSCOPY WITH PROPOFOL;  Surgeon: Arta Silence, MD;  [facility]: [facility];  Service: Endoscopy;  Laterality: N/A;  . colonscopy  2017  . DILATION AND CURETTAGE OF UTERUS     few done for endometriosis     Current Outpatient  Medications  Medication Sig Dispense Refill  . acetaminophen (TYLENOL) 325 MG tablet Take 325 mg by mouth daily as needed for mild pain, fever or headache.    . albuterol (PROVENTIL HFA) 108 (90 Base) MCG/ACT inhaler Inhale 2 puffs into the lungs every 4 (four) hours as needed for wheezing or shortness of breath.    . lisinopril (ZESTRIL) 10 MG tablet TK 1/2 T PO QD    . lisinopril (ZESTRIL) 5 MG tablet Take 1 tablet (5 mg total) by mouth daily. 90 tablet 3  . Menthol (HONEY LEMON COUGH DROPS MT) Use as directed 1 lozenge in the mouth or throat every 12 (twelve) hours as needed (for sore throat and cough).    . NONFORMULARY OR COMPOUNDED ITEM [facility]  Diclofenac, Baclofen, Gabapentin, Lidocaine, Menthol 100mg  2 refills     No current facility-administered medications for this visit.     Allergies:   Amlodipine, Bee venom, Other, Aspirin, Erythromycin, Hctz [hydrochlorothiazide], and Penicillins    Social History:  The patient  reports that she has never smoked. She has never used smokeless tobacco. She reports that she does not drink alcohol or use drugs.   Family History:  The patient's family history includes Allergies in her father; Asthma in her father; Breast  cancer in her mother; CAD (age of onset: 13) in her brother, father, and mother; CAD (age of onset: 55) in her brother; CAD (age of onset: 33) in her brother.    ROS: All other systems are reviewed and negative. Unless otherwise mentioned in H&P    PHYSICAL EXAM: VS:  There were no vitals taken for this visit. , BMI There is no height or weight on file to calculate BMI. GEN: Obese female, well developed, in no acute distress HEENT: normal Neck: no JVD, carotid bruits, or masses Cardiac: RRR; no murmurs, rubs, or gallops,no edema  Respiratory:  Clear to auscultation bilaterally, normal work of breathing GI: obese, not distended MS: no deformity or atrophy- minimal edema both LE with a small area of ecchymosis Rt  ankle Skin: warm and dry, no rash Neuro:  Strength and sensation are intact Psych: euthymic mood, full affect   Recent Labs: 09/28/2018: TSH 2.670 02/09/2019: BUN 17; Creatinine, Ser 0.66; Potassium 3.8; Sodium 143    Lipid Panel    Component Value Date/Time   CHOL 228 (H) 02/06/2016 0930   TRIG 93 02/06/2016 0930   HDL 69 02/06/2016 0930   CHOLHDL 3.3 02/06/2016 0930   VLDL 19 02/06/2016 0930   LDLCALC 140 (H) 02/06/2016 0930   LDLDIRECT 135.2 08/23/2013 1142      Wt Readings from Last 3 Encounters:  01/31/19 175 lb (79.4 kg)  12/04/18 165 lb (74.8 kg)  09/28/18 170 lb 12.8 oz (77.5 kg)      ASSESSMENT AND PLAN:  HTN- repeat B/P by me was 132/82- no change in Rx. F/U 6 months.   Edema- I offered her an Rx for chlorthalidone to take 3 x week but she declined.  She was unable to find support stockings- "sold out"  Obesity- BMI 35.  She says she had a negative sleep study in the past and that she has no issues with sleep now.   Current medicines are reviewed at length with the patient today.    Kerin Ransom PA-C 02/14/2019 8:39 AM

## 2019-02-14 ENCOUNTER — Ambulatory Visit: Payer: Medicaid Other | Admitting: Cardiology

## 2019-02-14 ENCOUNTER — Other Ambulatory Visit: Payer: Self-pay

## 2019-02-14 ENCOUNTER — Encounter: Payer: Self-pay | Admitting: Cardiology

## 2019-02-14 DIAGNOSIS — I10 Essential (primary) hypertension: Secondary | ICD-10-CM | POA: Insufficient documentation

## 2019-02-14 NOTE — Patient Instructions (Signed)
Medication Instructions:  Continue current medications  If you need a refill on your cardiac medications before your next appointment, please call your pharmacy.  Labwork: None ordered   Testing/Procedures: None Ordered  Follow-Up: . Your physician recommends that you schedule a follow-up appointment in: 6 Months with Kendra Gilbert .   At [facility], you and your health needs are our priority.  As part of our continuing mission to provide you with exceptional heart care, we have created designated Provider Care Teams.  These Care Teams include your primary Cardiologist (physician) and Advanced Practice Providers (APPs -  Physician Assistants and Nurse Practitioners) who all work together to provide you with the care you need, when you need it.  Thank you for choosing [facility]!!

## 2019-02-16 ENCOUNTER — Ambulatory Visit: Payer: Medicaid Other | Admitting: Cardiology

## 2019-03-07 ENCOUNTER — Encounter: Payer: Self-pay | Admitting: Podiatry

## 2019-03-07 ENCOUNTER — Other Ambulatory Visit: Payer: Self-pay

## 2019-03-07 ENCOUNTER — Ambulatory Visit (INDEPENDENT_AMBULATORY_CARE_PROVIDER_SITE_OTHER): Payer: Medicaid Other | Admitting: Podiatry

## 2019-03-07 DIAGNOSIS — M216X2 Other acquired deformities of left foot: Secondary | ICD-10-CM | POA: Diagnosis not present

## 2019-03-07 DIAGNOSIS — M76822 Posterior tibial tendinitis, left leg: Secondary | ICD-10-CM

## 2019-03-07 DIAGNOSIS — M19072 Primary osteoarthritis, left ankle and foot: Secondary | ICD-10-CM

## 2019-03-07 DIAGNOSIS — M659 Synovitis and tenosynovitis, unspecified: Secondary | ICD-10-CM

## 2019-03-07 DIAGNOSIS — G629 Polyneuropathy, unspecified: Secondary | ICD-10-CM

## 2019-03-07 MED ORDER — GABAPENTIN 100 MG PO CAPS: 100.0000 mg | ORAL_CAPSULE | Freq: Three times a day (TID) | ORAL | 3 refills | Status: DC

## 2019-03-11 NOTE — Progress Notes (Signed)
   Subjective:  63 year old female presenting today for follow up evaluation of left foot and ankle pain. She reports a significant amount of continued pain and associated swelling. She has been receiving injections for treatment with some relief and using the compression anklet which helps alleviate the pain temporarily. Bearing weight and applying pressure to the foot increases the pain. Patient is here for further evaluation and treatment.   Past Medical History:  Diagnosis Date  . Asthma   . Chronic kidney disease    cyst in left kidney  . Colon cancer ([place]) 2017   surgical tx  removal of polyp  . Coronary artery calcification    a. POET in 2015 with no acute findings.  . Diarrhea since 02-16-17   c dif test normal per pt  . Dyspnea     with exertion, occ at rest no oxygen or inhaler use  . GERD (gastroesophageal reflux disease)   . H/O: rheumatic fever as child  . Headache    occ left side migraine  . Hypertension   . Lung disease    Lung nodule  . 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 Pneumonia 2016  . Rheumatoid arthritis ([place])    oa also, back arthritis, hx rheumatoid     Objective / Physical Exam:  General:  The patient is alert and oriented x3 in no acute distress. Dermatology:  Skin is warm, dry and supple bilateral lower extremities. Negative for open lesions or macerations. Vascular:  Palpable pedal pulses bilaterally. No edema or erythema noted. Capillary refill within normal limits. Neurological:  Epicritic and protective threshold neuropathy bilaterally.  Musculoskeletal Exam:  Pain on palpation to the anterior lateral medial aspects of the patient's left ankle. Mild edema noted. Pain on palpation noted to the posterior tibial tendon of the left foot.  Range of motion within normal limits to all pedal and ankle joints bilateral. Muscle strength 5/5 in all groups bilateral.   Assessment: 1. Peripheral neuropathy, right greater than  left 2. DJD / synovitis left ankle 3. Posterior tibial tendinitis left  4. Lumbar radiculopathy   Plan of Care:  1. Patient was evaluated.  2. Prescription for Gabapentin 100 mg QHS provided to patient.  3. CAM boot provided for left lower extremity.  4. Orders placed for MRI of the left ankle.  5. Return to clinic in 4 weeks to review MRI results.    Edrick Kins, DPM [facility]  Dr. Edrick Kins, [address]                                        [place], [place] [address]                Office [phone]  Fax [phone]

## 2019-03-26 ENCOUNTER — Ambulatory Visit
Admission: RE | Admit: 2019-03-26 | Discharge: 2019-03-26 | Disposition: A | Discharge status: Home / Self Care | Payer: Medicaid Other | Source: Ambulatory Visit | Attending: Family Medicine | Admitting: Family Medicine

## 2019-03-26 ENCOUNTER — Other Ambulatory Visit: Payer: Self-pay

## 2019-03-26 DIAGNOSIS — Z1231 Encounter for screening mammogram for malignant neoplasm of breast: Secondary | ICD-10-CM

## 2019-04-04 ENCOUNTER — Ambulatory Visit (INDEPENDENT_AMBULATORY_CARE_PROVIDER_SITE_OTHER): Payer: Medicaid Other | Admitting: Podiatry

## 2019-04-04 ENCOUNTER — Telehealth: Payer: Self-pay | Admitting: *Deleted

## 2019-04-04 ENCOUNTER — Other Ambulatory Visit: Payer: Self-pay

## 2019-04-04 DIAGNOSIS — M216X2 Other acquired deformities of left foot: Secondary | ICD-10-CM | POA: Diagnosis not present

## 2019-04-04 DIAGNOSIS — M76822 Posterior tibial tendinitis, left leg: Secondary | ICD-10-CM

## 2019-04-04 DIAGNOSIS — M659 Synovitis and tenosynovitis, unspecified: Secondary | ICD-10-CM

## 2019-04-04 DIAGNOSIS — M19072 Primary osteoarthritis, left ankle and foot: Secondary | ICD-10-CM

## 2019-04-04 DIAGNOSIS — M7751 Other enthesopathy of right foot: Secondary | ICD-10-CM

## 2019-04-04 DIAGNOSIS — G629 Polyneuropathy, unspecified: Secondary | ICD-10-CM

## 2019-04-04 MED ORDER — TRAMADOL HCL 50 MG PO TABS: 50.0000 mg | ORAL_TABLET | ORAL | 0 refills | Status: AC

## 2019-04-04 NOTE — Telephone Encounter (Signed)
Dr. Amalia Hailey ordered MRI left ankle today.

## 2019-04-04 NOTE — Telephone Encounter (Signed)
Orders to be processed tomorrow.

## 2019-04-04 NOTE — Telephone Encounter (Signed)
-----   Message from Edrick Kins, DPM sent at 04/04/2019  8:22 AM EDT ----- Regarding: MRI left ankle Please order MRI left ankle without contrast.  Dx: posterior tibial tendon tear left. Chronic ankle pain left.   Thanks, Dr. Amalia Hailey

## 2019-04-04 NOTE — Progress Notes (Signed)
   Subjective:  63 year old female presenting today for follow up evaluation of left foot and ankle pain. She reports a significant amount of continued pain and associated swelling. She has been receiving injections for treatment with some relief and using the compression anklet which helps alleviate the pain temporarily. Bearing weight and applying pressure to the foot increases the pain. Patient is here for further evaluation and treatment.   Past Medical History:  Diagnosis Date  . Asthma   . Chronic kidney disease    cyst in left kidney  . Colon cancer ([place]) 2017   surgical tx  removal of polyp  . Coronary artery calcification    a. POET in 2015 with no acute findings.  . Diarrhea since 02-16-17   c dif test normal per pt  . Dyspnea     with exertion, occ at rest no oxygen or inhaler use  . GERD (gastroesophageal reflux disease)   . H/O: rheumatic fever as child  . Headache    occ left side migraine  . Hypertension   . Lung disease    Lung nodule  . 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 Pneumonia 2016  . Rheumatoid arthritis ([place])    oa also, back arthritis, hx rheumatoid     Objective / Physical Exam:  General:  The patient is alert and oriented x3 in no acute distress. Dermatology:  Skin is warm, dry and supple bilateral lower extremities. Negative for open lesions or macerations. Vascular:  Palpable pedal pulses bilaterally. No edema or erythema noted. Capillary refill within normal limits. Neurological:  Epicritic and protective threshold neuropathy bilaterally.  Musculoskeletal Exam:  Pain on palpation to the anterior lateral medial aspects of the patient's left ankle. Mild edema noted. Pain on palpation noted to the posterior tibial tendon of the left foot.  Range of motion within normal limits to all pedal and ankle joints bilateral. Muscle strength 5/5 in all groups bilateral.   Assessment: 1. Peripheral neuropathy, right greater than  left 2. DJD / synovitis left ankle 3. Posterior tibial tendinitis left  4. Lumbar radiculopathy  5.  Right foot metatarsalgia  Plan of Care:  1. Patient was evaluated.  2.  MRI is still pending insurance authorization for the left ankle to rule out posterior tibial tendon tear 3.  Discontinue gabapentin 100 mg nightly.  Patient states it is not helping alleviate any symptoms 4.  Continue OTC Tylenol as needed 5.  Prescription for tramadol 50 mg 6.  Continue compression ankle sleeves and cam boot PRN 7.  Patient cannot tolerate oral NSAIDs due to GI symptoms 8.  Return to clinic after MRI is completed to review MRI results and possible surgical consult  Edrick Kins, DPM [facility]  Dr. Edrick Kins, [address]                                        [place], [place] [address]                Office [phone]  Fax [phone]

## 2019-04-04 NOTE — Telephone Encounter (Signed)
[employer] - Medicaid statement, "THE PROGRRAM IS TERMED, CASE CANNOT BE CREATED AGAINST A TERMED PROGRAM." I called [employer] - Medicaid-Desiree states pt's plan ends today and will begin again tomorrow, begin case tomorrow.

## 2019-04-05 NOTE — Telephone Encounter (Signed)
[employer] - Medicaid requires clinicals for review for pre-cert of [id number] left ankle without contrast, Case:  [id number]. Orders faxed to [place].

## 2019-04-09 NOTE — Telephone Encounter (Signed)
[employer] - MEDICAID AUTHORIZATION: [id number] FOR LEFT ANKLE WITHOUT CONTRAST [phone], EFFECTIVE: 04/05/2019, END:  10/02/2019 FOR CASE:  [id number]. Faxed to [place].

## 2019-04-17 ENCOUNTER — Ambulatory Visit
Admission: RE | Admit: 2019-04-17 | Discharge: 2019-04-17 | Disposition: A | Discharge status: Home / Self Care | Payer: Medicaid Other | Source: Ambulatory Visit | Attending: Podiatry | Admitting: Podiatry

## 2019-04-23 ENCOUNTER — Ambulatory Visit: Payer: Medicaid Other | Admitting: Podiatry

## 2019-04-23 ENCOUNTER — Other Ambulatory Visit: Payer: Self-pay

## 2019-04-23 ENCOUNTER — Telehealth: Payer: Self-pay | Admitting: *Deleted

## 2019-04-23 DIAGNOSIS — K635 Polyp of colon: Secondary | ICD-10-CM | POA: Insufficient documentation

## 2019-04-23 DIAGNOSIS — E7800 Pure hypercholesterolemia, unspecified: Secondary | ICD-10-CM | POA: Insufficient documentation

## 2019-04-23 DIAGNOSIS — K921 Melena: Secondary | ICD-10-CM | POA: Insufficient documentation

## 2019-04-23 DIAGNOSIS — K219 Gastro-esophageal reflux disease without esophagitis: Secondary | ICD-10-CM | POA: Insufficient documentation

## 2019-04-23 DIAGNOSIS — M76822 Posterior tibial tendinitis, left leg: Secondary | ICD-10-CM

## 2019-04-23 DIAGNOSIS — M659 Synovitis and tenosynovitis, unspecified: Secondary | ICD-10-CM

## 2019-04-23 DIAGNOSIS — R1013 Epigastric pain: Secondary | ICD-10-CM | POA: Insufficient documentation

## 2019-04-23 DIAGNOSIS — R198 Other specified symptoms and signs involving the digestive system and abdomen: Secondary | ICD-10-CM | POA: Insufficient documentation

## 2019-04-23 DIAGNOSIS — K589 Irritable bowel syndrome without diarrhea: Secondary | ICD-10-CM | POA: Insufficient documentation

## 2019-04-23 DIAGNOSIS — M19072 Primary osteoarthritis, left ankle and foot: Secondary | ICD-10-CM | POA: Diagnosis not present

## 2019-04-23 NOTE — Telephone Encounter (Signed)
-----   Message from Edrick Kins, DPM sent at 04/23/2019  9:22 AM EDT ----- Regarding: Overread services Valery,   Patient has chronic painful Posterior Tibial tendinitis left.   Will you please send the MRI off for an overread? I really need them to focus on the posterior tibial tendon. I'm not sure who Dr. Milinda Pointer uses, but I know he sends off for overread frequently.   Thanks, Dr. Amalia Hailey.

## 2019-04-23 NOTE — Telephone Encounter (Signed)
Faxed request for copy of MRI disc to [facility].

## 2019-04-23 NOTE — Telephone Encounter (Signed)
Mailed copy of MRI disc to [facility]. 

## 2019-04-26 NOTE — Progress Notes (Signed)
    HPI: 63 y.o. female presenting today for follow up evaluation of left foot and ankle pain. She reports continued aching pain with associated swelling. She states the pain medication she has been taking causes drowsiness. Being on the foot increases the symptoms. She has been using the CAM boot as directed. Patient is here for further evaluation and treatment.    Past Medical History:  Diagnosis Date  . Asthma   . Chronic kidney disease    cyst in left kidney  . Colon cancer ([place]) 2017   surgical tx  removal of polyp  . Coronary artery calcification    a. POET in 2015 with no acute findings.  . Diarrhea since 02-16-17   c dif test normal per pt  . Dyspnea     with exertion, occ at rest no oxygen or inhaler use  . GERD (gastroesophageal reflux disease)   . H/O: rheumatic fever as child  . Headache    occ left side migraine  . Hypertension   . Lung disease    Lung nodule  . 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 Pneumonia 2016  . Rheumatoid arthritis ([place])    oa also, back arthritis, hx rheumatoid        Physical Exam: General: The patient is alert and oriented x3 in no acute distress.  Dermatology: Skin is warm, dry and supple bilateral lower extremities. Negative for open lesions or macerations.  Vascular: Left ankle edema noted. Palpable pedal pulses bilaterally. No erythema noted. Capillary refill within normal limits.  Neurological: Epicritic and protective threshold grossly intact bilaterally.   Musculoskeletal Exam: Pain on palpation noted to the posterior tibial tendon of the left foot. Range of motion within normal limits. Muscle strength 5/5 in all muscle groups bilateral lower extremities.  MRI Impression:  1. Mild plantar fasciitis of the medial band of the plantar fascia at the calcaneal insertion. 2. No acute osseous injury of the left ankle. 3. Mild osteoarthritis of the talonavicular joint. 4. Mild osteoarthritis of the second  and third tarsometatarsal joints.   Assessment: 1. Posterior tibial tendinitis left 2. Edema left ankle    Plan of Care:  1. Patient was evaluated. 2. MRI reviewed. Findings not pertinent to patient's symptoms.  3. MRI sent for over read. Office will contact patient with results and discuss next steps in treatment. 4. Continue taking OTC Tylenol as needed.  5. Refill prescription for tramadol 50 mg provided to patient.  6. Continue using compression ankle sleeves and CAM boot as needed.  7. Patient cannot tolerate oral NSAIDs due to GI symptoms.  8. Return to clinic as needed.    Edrick Kins, DPM [facility]  Dr. Edrick Kins, [address]                                        [place], [place] [address]                Office [phone]  Fax [phone]

## 2019-05-01 ENCOUNTER — Encounter: Payer: Self-pay | Admitting: Podiatry

## 2019-05-08 ENCOUNTER — Emergency Department (HOSPITAL_COMMUNITY): Payer: Medicaid Other

## 2019-05-08 ENCOUNTER — Emergency Department (HOSPITAL_COMMUNITY)
Admission: EM | Admit: 2019-05-08 | Discharge: 2019-05-08 | Disposition: A | Discharge status: Home / Self Care | Payer: Medicaid Other | Attending: Emergency Medicine | Admitting: Emergency Medicine

## 2019-05-08 ENCOUNTER — Encounter (HOSPITAL_COMMUNITY): Payer: Self-pay

## 2019-05-08 ENCOUNTER — Other Ambulatory Visit: Payer: Self-pay

## 2019-05-08 DIAGNOSIS — J45909 Unspecified asthma, uncomplicated: Secondary | ICD-10-CM | POA: Diagnosis not present

## 2019-05-08 DIAGNOSIS — R0981 Nasal congestion: Secondary | ICD-10-CM

## 2019-05-08 DIAGNOSIS — I10 Essential (primary) hypertension: Secondary | ICD-10-CM | POA: Insufficient documentation

## 2019-05-08 DIAGNOSIS — Z79899 Other long term (current) drug therapy: Secondary | ICD-10-CM | POA: Insufficient documentation

## 2019-05-08 DIAGNOSIS — R05 Cough: Secondary | ICD-10-CM | POA: Insufficient documentation

## 2019-05-08 DIAGNOSIS — R059 Cough, unspecified: Secondary | ICD-10-CM

## 2019-05-08 MED ORDER — ALBUTEROL SULFATE HFA 108 (90 BASE) MCG/ACT IN AERS
2.0000 | INHALATION_SPRAY | Freq: Once | RESPIRATORY_TRACT | Status: AC
  Administered 2019-05-08: 08:00:00 2 via RESPIRATORY_TRACT
  Filled 2019-05-08: qty 6.7

## 2019-05-08 MED ORDER — ALBUTEROL SULFATE HFA 108 (90 BASE) MCG/ACT IN AERS: 2.0000 | INHALATION_SPRAY | RESPIRATORY_TRACT | 2 refills | Status: DC | PRN

## 2019-05-08 MED ORDER — BENZONATATE 100 MG PO CAPS: 100.0000 mg | ORAL_CAPSULE | Freq: Three times a day (TID) | ORAL | 1 refills | Status: DC

## 2019-05-08 NOTE — Discharge Instructions (Signed)
Please read and follow all provided instructions.  Your diagnoses today include:  1. Cough   2. Nasal congestion     Tests performed today include:  Chest x-ray - does not show any pneumonia  Vital signs. See below for your results today.   Medications prescribed:   Albuterol inhaler - medication that opens up your airway  Use inhaler as follows: 1-2 puffs with spacer every 4 hours as needed for wheezing, cough, or shortness of breath.    Tessalon Perles - cough suppressant medication  Take any prescribed medications only as directed.  Home care instructions:  Follow any educational materials contained in this packet.  Follow-up instructions: Please follow-up with your primary care provider in the next 3 days for further evaluation of your symptoms and a recheck if you are not feeling better.   Return instructions:   Please return to the Emergency Department if you experience worsening symptoms.  Please return with worsening wheezing, shortness of breath, or difficulty breathing.  Return with persistent fever above 101F.   Please return if you have any other emergent concerns.  Additional Information:  Your vital signs today were: BP (!) 162/90 (BP Location: Right Arm)    Pulse 77    Temp 97.8 F (36.6 C) (Oral)    Resp 20    Ht 4\' 11"  (1.499 m)    Wt 77.1 kg    SpO2 100%    BMI 34.34 kg/m  If your blood pressure (BP) was elevated above 135/85 this visit, please have this repeated by your doctor within one month. --------------

## 2019-05-08 NOTE — ED Provider Notes (Signed)
[facility] DEPT Provider Note   CSN: [id number] Arrival date & time: 05/08/19  [id number]     History   Chief Complaint Chief Complaint  Patient presents with  . Asthma    HPI Kendra Gilbert is a 63 y.o. female.     Patient with history of seasonal allergies, asthma presents the emergency department with ongoing dry cough, itchy throat, nasal congestion and discharge ongoing over the past 2 weeks.  Patient states that the symptoms occur during this time of year typically and she has had sinusitis and pneumonia in the past and she is concerned about this.  She states that she is unable to get into see her primary care doctor because of Covid.  She denies any fevers, ear pain.  She has been using cough drops at home without much relief.  She has used an inhaler in the past with improvement.  No nausea, vomiting, or diarrhea.  She would like to avoid prednisone because of stomach issues.  No known sick contacts or contacts with coronavirus.     Past Medical History:  Diagnosis Date  . Asthma   . Chronic kidney disease    cyst in left kidney  . Colon cancer ([place]) 2017   surgical tx  removal of polyp  . Coronary artery calcification    a. POET in 2015 with no acute findings.  . Diarrhea since 02-16-17   c dif test normal per pt  . Dyspnea     with exertion, occ at rest no oxygen or inhaler use  . GERD (gastroesophageal reflux disease)   . H/O: rheumatic fever as child  . Headache    occ left side migraine  . Hypertension   . Lung disease    Lung nodule  . 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 Pneumonia 2016  . Rheumatoid arthritis ([place])    oa also, back arthritis, hx rheumatoid     Patient Active Problem List   Diagnosis Date Noted  . Alternating constipation and diarrhea 04/23/2019  . Blood in feces 04/23/2019  . Epigastric pain 04/23/2019  . Gastroesophageal reflux disease 04/23/2019  . Hematochezia 04/23/2019   . Hypercholesterolemia 04/23/2019  . Irritable bowel syndrome without diarrhea 04/23/2019  . Polyp of colon 04/23/2019  . Essential hypertension 02/14/2019  . Fibromyalgia 01/16/2019  . Primary osteoarthritis involving multiple joints 01/16/2019  . DDD (degenerative disc disease), cervical 09/20/2018  . Chest pain 02/17/2016  . Dyslipidemia 02/17/2016  . Family history of heart disease   . Dyspnea 01/20/2016  . Multiple pulmonary nodules determined by computed tomography of lung 01/20/2016  . Unspecified osteoarthritis, unspecified site 11/16/2013  . Coronary artery calcification 08/23/2013  . Upper airway cough syndrome 08/19/2013  . Exertional chest pain 08/19/2013  . Restless leg syndrome 11/14/2012  . Sleep apnea 10/03/2012  . Interstitial lung disease ([place]) 04/12/2011  . Wheezing 04/12/2011    Past Surgical History:  Procedure Laterality Date  . ABDOMINAL HYSTERECTOMY     ovaries left  . COLONOSCOPY WITH PROPOFOL N/A 03/23/2017   Procedure: COLONOSCOPY WITH PROPOFOL;  Surgeon: Arta Silence, MD;  Location: [facility] END[facility]COPY;  Service: Endoscopy;  Laterality: N/A;  . colonscopy  2017  . DILATION AND CURETTAGE OF UTERUS     few done for endometriosis     OB History   No obstetric history on file.      Home Medications    Prior to Admission medications   Medication Sig Start  Date End Date Taking? Authorizing Provider  acetaminophen (TYLENOL) 325 MG tablet Take 325 mg by mouth daily as needed for mild pain, fever or headache.    [provider]  albuterol (PROVENTIL HFA) 108 (90 Base) MCG/ACT inhaler Inhale 2 puffs into the lungs every 4 (four) hours as needed for wheezing or shortness of breath.    [provider]  diphenhydrAMINE (BENADRYL) 25 MG tablet Take 25 mg by mouth every 6 (six) hours as needed.    [provider]  gabapentin (NEURONTIN) 100 MG capsule Take 1 capsule (100 mg total) by mouth 3 (three) times daily. 03/07/19   Edrick Kins, DPM  lisinopril (ZESTRIL) 10 MG tablet TK 1/2 T PO QD 01/31/19   [provider]  lisinopril (ZESTRIL) 5 MG tablet Take 1 tablet (5 mg total) by mouth daily. 01/31/19 05/01/19  Lendon Colonel, NP  Menthol (HONEY LEMON COUGH DROPS MT) Use as directed 1 lozenge in the mouth or throat every 12 (twelve) hours as needed (for sore throat and cough).    [provider]  NONFORMULARY OR COMPOUNDED ITEM [facility]  Diclofenac, Baclofen, Gabapentin, Lidocaine, Menthol 100mg  2 refills    [provider]  rOPINIRole (REQUIP) 0.5 MG tablet Take by mouth. 11/14/12   [provider]    Family History Family History  Problem Relation Age of Onset  . CAD Mother 22       CABG/stents  . Breast cancer Mother   . Asthma Father   . CAD Father 2  . Allergies Father   . CAD Brother 68       Died with MI  . CAD Brother 44       9 stents  . CAD Brother 70       7 stents    Social History Social History   Tobacco Use  . Smoking status: Never Smoker  . Smokeless tobacco: Never Used  Substance Use Topics  . Alcohol use: No  . Drug use: No     Allergies   Amlodipine, Bee venom, Other, Aspirin, Erythromycin, Hctz [hydrochlorothiazide], and Penicillins   Review of Systems Review of Systems  Constitutional: Negative for chills, fatigue and fever.  HENT: Positive for congestion and sore throat. Negative for ear pain, rhinorrhea and sinus pressure.   Eyes: Negative for redness.  Respiratory: Positive for cough and wheezing.   Cardiovascular: Negative for leg swelling.  Gastrointestinal: Negative for abdominal pain, diarrhea, nausea and vomiting.  Genitourinary: Negative for dysuria.  Musculoskeletal: Negative for myalgias and neck stiffness.  Skin: Negative for rash.  Neurological: Negative for headaches.  Hematological: Negative for adenopathy.     Physical Exam Updated Vital Signs BP (!) 162/90 (BP Location: Right Arm)   Pulse 77    Temp 97.8 F (36.6 C) (Oral)   Resp 20   Ht 4\' 11"  (1.499 m)   Wt 77.1 kg   SpO2 100%   BMI 34.34 kg/m   Physical Exam Vitals signs and nursing note reviewed.  Constitutional:      General: She is not in acute distress.    Appearance: She is well-developed.  HENT:     Head: Normocephalic and atraumatic.     Jaw: No trismus.     Right Ear: Tympanic membrane, ear canal and external ear normal.     Left Ear: Tympanic membrane, ear canal and external ear normal.     Nose: Nose normal. No mucosal edema or rhinorrhea.  Mouth/Throat:     Mouth: Mucous membranes are not dry. No oral lesions.     Pharynx: Uvula midline. No oropharyngeal exudate, posterior oropharyngeal erythema or uvula swelling.     Tonsils: No tonsillar abscesses.  Eyes:     General:        Right eye: No discharge.        Left eye: No discharge.     Conjunctiva/sclera: Conjunctivae normal.  Neck:     Musculoskeletal: Normal range of motion and neck supple.  Cardiovascular:     Rate and Rhythm: Normal rate and regular rhythm.     Heart sounds: Normal heart sounds.  Pulmonary:     Effort: Pulmonary effort is normal. No respiratory distress.     Breath sounds: Wheezing present. No rales.     Comments: Minimal scattered wheezing bilaterally.  Abdominal:     Palpations: Abdomen is soft.     Tenderness: There is no abdominal tenderness.  Lymphadenopathy:     Cervical: No cervical adenopathy.  Skin:    General: Skin is warm and dry.  Neurological:     Mental Status: She is alert.      ED Treatments / Results  Labs (all labs ordered are listed, but only abnormal results are displayed) Labs Reviewed - No data to display  EKG None  Radiology Dg Chest 2 View  Result Date: 05/08/2019 CLINICAL DATA:  Cough for 2 weeks EXAM: CHEST - 2 VIEW COMPARISON:  August 09, 2018. FINDINGS: No edema or consolidation. Heart size and pulmonary vascularity are normal. No adenopathy. There is mild degenerative change in  the thoracic spine. IMPRESSION: No edema or consolidation.  Cardiac silhouette within normal limits. Electronically Signed   By: Lowella Grip III M.D.   On: 05/08/2019 07:13    Procedures Procedures (including critical care time)  Medications Ordered in ED Medications - No data to display   Initial Impression / Assessment and Plan / ED Course  I have reviewed the triage vital signs and the nursing notes.  Pertinent labs & imaging results that were available during my care of the patient were reviewed by me and considered in my medical decision making (see chart for details).        Patient seen and examined.  Will obtain x-ray.  Patient will need an inhaler and medicine for cough at a minimum.  Will discuss antibiotics given duration of symptoms.  Vital signs reviewed and are as follows: BP (!) 162/90 (BP Location: Right Arm)   Pulse 77   Temp 97.8 F (36.6 C) (Oral)   Resp 20   Ht 4\' 11"  (1.499 m)   Wt 77.1 kg   SpO2 100%   BMI 34.34 kg/m   7:22 AM chest x-ray findings reviewed.  No signs of pneumonia or other problems.  Patient will be discharged home with albuterol inhaler, Tessalon.  Patient counseled on supportive care and s/s to return including worsening symptoms, persistent fever, persistent vomiting, or if they have any other concerns. Urged to see PCP if symptoms persist for more than 3 days. Patient verbalizes understanding and agrees with plan.    Final Clinical Impressions(s) / ED Diagnoses   Final diagnoses:  Cough  Nasal congestion   Patient with ongoing cough and nasal congestion, upper respiratory symptoms.  She has a negative x-ray tonight.  No fevers.  No concern for coronavirus.  Symptomatic care as above.  Vital signs reassuring.  Kendra Gilbert was evaluated in Emergency Department on 05/08/2019 for  the symptoms described in the history of present illness. She was evaluated in the context of the global COVID-19 pandemic, which necessitated  consideration that the patient might be at risk for infection with the SARS-CoV-2 virus that causes COVID-19. Institutional protocols and algorithms that pertain to the evaluation of patients at risk for COVID-19 are in a state of rapid change based on information released by regulatory bodies including the CDC and federal and state organizations. These policies and algorithms were followed during the patient's care in the ED.   ED Discharge Orders         Ordered    albuterol (PROVENTIL HFA) 108 (90 Base) MCG/ACT inhaler  Every 4 hours PRN     05/08/19 0722    benzonatate (TESSALON) 100 MG capsule  Every 8 hours     05/08/19 0722           Carlisle Cater, PA-C 05/08/19 [id number]    Maudie Flakes, MD 05/10/19 1454

## 2019-05-08 NOTE — ED Notes (Signed)
Patient transported to X-ray 

## 2019-05-08 NOTE — ED Triage Notes (Addendum)
Pt coming from home c/o asthma that started a few weeks ago that has gotten progressively worse. Has dry cough with left side feeling itchy. Pt needs a new inhaler. States she has seasonal allergies too

## 2019-05-09 ENCOUNTER — Telehealth: Payer: Self-pay | Admitting: Podiatry

## 2019-05-09 NOTE — Telephone Encounter (Signed)
Pt called wanting to know what her next step should be and see if there have been any changes since her MRI.  Pt states that the doctor was going to have her MRI "re scanned" and didn't know if anything had changed .

## 2019-06-07 ENCOUNTER — Ambulatory Visit (INDEPENDENT_AMBULATORY_CARE_PROVIDER_SITE_OTHER): Payer: Medicaid Other | Admitting: Allergy

## 2019-06-07 ENCOUNTER — Encounter: Payer: Self-pay | Admitting: Allergy

## 2019-06-07 VITALS — BP 138/90 | HR 85 | Temp 96.7°F | Resp 16 | Ht <= 58 in | Wt 180.2 lb

## 2019-06-07 DIAGNOSIS — J3089 Other allergic rhinitis: Secondary | ICD-10-CM

## 2019-06-07 DIAGNOSIS — R05 Cough: Secondary | ICD-10-CM | POA: Diagnosis not present

## 2019-06-07 DIAGNOSIS — R059 Cough, unspecified: Secondary | ICD-10-CM

## 2019-06-07 MED ORDER — AZELASTINE HCL 0.1 % NA SOLN: NASAL | 5 refills | Status: DC

## 2019-06-07 MED ORDER — BUDESONIDE-FORMOTEROL FUMARATE 160-4.5 MCG/ACT IN AERO: INHALATION_SPRAY | RESPIRATORY_TRACT | 5 refills | Status: DC

## 2019-06-07 MED ORDER — IPRATROPIUM-ALBUTEROL 0.5-2.5 (3) MG/3ML IN SOLN: RESPIRATORY_TRACT | 1 refills | Status: DC

## 2019-06-07 NOTE — Progress Notes (Signed)
New Patient Note  RE: Kendra Gilbert MRN: [id number] DOB: 1956/06/30 Date of Office Visit: 06/07/2019  Referring provider: Leighton Ruff, MD Primary care provider: Leighton Ruff, MD  Chief Complaint: Left Sided Throat Itching/Cough/Runny Nose  History of present illness: Kendra Gilbert is a 63 y.o. female presenting today for evaluation of cough with associated L sided throat irritation.  Hhistory obtained by Dr. Gilford Rile, medicine resident.   Patient states that her symptoms started in September of this year with a slight, dry cough. Patient states that her cough has progressed to a persistent cough with L sided posterior oropharynx irritation. For her cough, the patient states that robitussin, benadryl, TheraFlu, tessalon pearls, and her home albuterol have not alleviated her symptoms. Cool weather, being indoors, and having the wind blow aggravate her cough.    Additionally, she describes her throat irritation as "itchy" and that it "feels like something is growing there." Patient states that eating crackers and nuts irritates her L sided "itchiness." When her throat becomes irritated she endorses coughing fits, which lead to SOB, wheezing, and voice changes the patient describes as a transient "laryngitis." She states that her albuterol inhaler helps "cool" the pruritis, but nothing alleviates her symptoms.   Lastly, Patient comes with complaints of rhinitis, which has been going on for years. Patient states that her symptoms have progressed since September of this year. Patient states that she has clear discharge coming from her nose and goes to the back of her throat. 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 Over the counter antihistamines have not alleviated her symptoms.   She has seen Dr. Melvyn Novas, pulmonologist in 2017 and had environmental allergy panel done showing sensitivity to dust mites, dog dander, grass pollen, tree pollen, weed pollen.      Review of systems: Review of Systems  Constitutional: Negative for chills, fever and malaise/fatigue.  HENT: Positive for congestion and sore throat. Negative for ear discharge, ear pain, nosebleeds and sinus pain.   Eyes: Negative for blurred vision.  Respiratory: Positive for cough, shortness of breath and wheezing. Negative for hemoptysis, sputum production and stridor.   Gastrointestinal: Negative for abdominal pain, constipation, diarrhea, nausea and vomiting.  Skin: Positive for itching and rash.       Exposure to mixed blend fabrics, pollen, dust, strong fragrances, and medication allergies create pruritis and rashes.   Neurological: Negative for dizziness, tingling and headaches.    All other systems negative unless noted above in HPI  Past medical history: Past Medical History:  Diagnosis Date   Asthma    Chronic kidney disease    cyst in left kidney   Colon cancer ([place]) 2017   surgical tx  removal of polyp   Coronary artery calcification    a. POET in 2015 with no acute findings.   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   no current issues with   Pneumonia 2016   Rheumatoid arthritis ([place])    oa also, back arthritis, hx rheumatoid     Past surgical history: Past Surgical History:  Procedure Laterality Date   ABDOMINAL  HYSTERECTOMY     ovaries left   COLONOSCOPY WITH PROPOFOL N/A 03/23/2017   Procedure: COLONOSCOPY WITH PROPOFOL;  Surgeon: Arta Silence, MD;  Location: [facility];  Service: Endoscopy;  Laterality: N/A;   colonscopy  2017   DILATION AND CURETTAGE OF UTERUS     few done for endometriosis    Family history:  Family History  Problem Relation Age  of Onset   CAD Mother 74       CABG/stents   Breast cancer Mother    Asthma Father    CAD Father 79   Allergies Father    CAD Brother 67       Died with MI   CAD Brother 80       9 stents   CAD Brother 52       7 stents    Social history: Lives in home without carpeting with electric heating and central cooling.  Bird in the home.  No concern for water damage, mildew or roaches in the home.  She is retired.  No smoking history.   Medication List: Current Outpatient Medications  Medication Sig Dispense Refill   acetaminophen (TYLENOL) 325 MG tablet Take 325 mg by mouth daily as needed for mild pain, fever or headache.     albuterol (PROVENTIL HFA) 108 (90 Base) MCG/ACT inhaler Inhale 2 puffs into the lungs every 4 (four) hours as needed for wheezing or shortness of breath. 18 g 2   diphenhydrAMINE (BENADRYL) 25 MG tablet Take 25 mg by mouth every 6 (six) hours as needed.     gabapentin (NEURONTIN) 100 MG capsule Take 1 capsule (100 mg total) by mouth 3 (three) times daily. 90 capsule 3   Menthol (HONEY LEMON COUGH DROPS MT) Use as directed 1 lozenge in the mouth or throat every 12 (twelve) hours as needed (for sore throat and cough).     NONFORMULARY OR COMPOUNDED ITEM [facility]  Diclofenac, Baclofen, Gabapentin, Lidocaine, Menthol 100mg  2 refills     TRAMADOL HCL PO      lisinopril (ZESTRIL) 5 MG tablet Take 1 tablet (5 mg total) by mouth daily. 90 tablet 3   No current facility-administered medications for this visit.     Known medication allergies: Allergies  Allergen Reactions   Amlodipine Shortness Of Breath and Swelling   Bee Venom Anaphylaxis and Hives   Other Other (See Comments)    Mosquito bites cause hives all over body   Aspirin Other (See Comments)    Causes stomach problems   Erythromycin Other (See Comments)    Ate the lining of her stomach   Hctz [Hydrochlorothiazide]     Rash    Penicillins Rash    Has patient had a  PCN reaction causing immediate rash, facial/tongue/throat swelling, SOB or lightheadedness with hypotension: Yes Has patient had a PCN reaction causing severe rash involving mucus membranes or skin necrosis: Yes Has patient had a PCN reaction that required hospitalization: No  Has patient had a PCN reaction occurring within the last 10 years: No If all of the above answers are "NO", then may proceed with Cephalosporin use.      Physical examination: Blood pressure 138/90, pulse 85, temperature (!) 96.7 F (35.9 C), temperature source Temporal, resp. rate 16, height 4' 9.25" (1.454 m), weight 180 lb 3.2 oz (81.7 kg), SpO2 97 %.  General: Alert, interactive, in no acute distress. HEENT: TMs pearly gray, turbinates edematous with clear discharge, post-pharynx erythematous. Neck: Supple without lymphadenopathy. Lungs: Clear  to auscultation without wheezing, rhonchi or rales. {no increased work of breathing. CV: Normal S1, S2 without murmurs. Abdomen: Nondistended, nontender. Skin: Warm and dry, without lesions or rashes. Extremities:  No clubbing, cyanosis or edema. Neuro:   Grossly intact.  Diagnositics/Labs: Labs: see HPI Imaging: CXR 05/08/2019 per my read with low lung volumes otherwise clear  Spirometry: FEV1: 50, FVC: 47, ratio consistent with Restrictive Lung Disease  Patient underwent Duoneb treatment with improved results: FEV1: 62, FVC: 57 ratio consistent with moderate restriction.  23% improvement in FEV1 which is significant.  Allergy testing: environmental allergy skin prick testing is positive to grasses, weeds, trees, tricophyton mentagrophytes, dust mites. Allergy testing results were read and interpreted by provider, documented by clinical staff.   Assessment and plan: Allergic rhinitis with significant post-nasal drainage - environmental allergy skin testing today is positive to grass pollens, weed pollens, tree pollens, mold, dust mites - allergen avoidance  measures discussed/handouts provided - trial Xyzal 5mg  daily.  This is a long acting antihistamine that can replace chlortabs if more effective - recommend performing nasal saline rinses daily (prior to medicated nasal spray use).  Breathe through your mouth while performing saline rinse.   - for nasal drainage control start nasal antihistamine, Astelin 2 sprays each nostril twice a day.   Cough - likely multifactorial with significant allergy component.  Also may be drug-induced by lisinopril.  She also has quite reduced lung function with low lung volumes on most recent CXR - have access to albuterol inhaler 2 puffs or Duoneb 1 vial via nebulizer every 4-6 hours as needed for cough/wheeze/shortness of breath/chest tightness.  May use 15-20 minutes prior to activity.   Monitor frequency of use.    Use inhaler with spacer device.  - recommend trial of Symbicort 2 puffs twice a day (use with spacer) to help with cough and improve lung function  - discussed Lisinopril (ACE inhibitors) can lead to a dry cough that can start at anytime while taking Lisinopril.  This is a side effect of the medication and resolves with stopping the medication.  Recommend discussing with your cardiologist or PCP regarding any other alternative medications for blood pressure control.     Follow-up in 6-8 weeks or sooner if needed   Maudie Mercury, MD Internal Medicine Resident  I performed/discussed the history and physical examination of the patient with Dr. Gilford Rile. I reviewed Dr. Jackson Latino note and agree with the documented findings and plan of care with following additions/exceptions: none  I appreciate the opportunity to take part in Leshay's care. Please do not hesitate to contact me with questions.  Sincerely,   Prudy Feeler, MD Allergy/Immunology [facility]

## 2019-06-07 NOTE — Patient Instructions (Addendum)
-   environmental allergy skin testing today is positive to grass pollens, weed pollens, tree pollens, mold, dust mites - allergen avoidance measures discussed/handouts provided - trial Xyzal 5mg  daily.  This is a long acting antihistamine that can replace chlortabs if more effective - recommend performing nasal saline rinses daily (prior to medicated nasal spray use).  Breathe through your mouth while performing saline rinse.   - for nasal drainage control start nasal antihistamine, Astelin 2 sprays each nostril twice a day.   - have access to albuterol inhaler 2 puffs or Duoneb 1 vial via nebulizer every 4-6 hours as needed for cough/wheeze/shortness of breath/chest tightness.  May use 15-20 minutes prior to activity.   Monitor frequency of use.    Use inhaler with spacer device.  - recommend trial of Symbicort 2 puffs twice a day (use with spacer) to help with cough and improve lung function   - discussed Lisinopril (ACE inhibitors) can lead to a dry cough that can start at anytime while taking Lisinopril.  This is a side effect of the medication and resolves with stopping the medication.  Recommend discussing with your cardiologist or PCP regarding any other alternative medications for blood pressure control.     Follow-up in 6-8 weeks or sooner if needed

## 2019-06-12 ENCOUNTER — Telehealth: Payer: Self-pay | Admitting: Cardiology

## 2019-06-12 MED ORDER — LOSARTAN POTASSIUM 25 MG PO TABS: 25.0000 mg | ORAL_TABLET | Freq: Every day | ORAL | 3 refills | Status: DC

## 2019-06-12 NOTE — Telephone Encounter (Signed)
New Message    Pt c/o medication issue:  1. Name of Medication:lisinopril (ZESTRIL) 5 MG tablet(Expired)  2. How are you currently taking this medication (dosage and times per day)? Take 1 tablet (5 mg total) by mouth daily.  3. Are you having a reaction (difficulty breathing--STAT)? Yes  4. What is your medication issue? Patient states that the medication is causing her to do a lot of coughing and having difficulty breathing. Patent would like to change medication.

## 2019-06-12 NOTE — Telephone Encounter (Signed)
Left detailed message-stop lisinopril start losartan 25mg  daily. Refill sent to pharmacy on file.

## 2019-06-12 NOTE — Telephone Encounter (Signed)
Pt called and states that she has been taking lisinopril since October and states that she has been experiencing dry cough since and now sore throat and very bad itching in the back of her throat(she thought that this was just her allergies and asthma) she  went to the allergist and he said that he has noted that the lisinopril in the past with other pt's has caused all these sx that she has. She has taken the lisinopril today because she was afraid about her BP she states that it has been fine(has not taken today but yesterday was117/70 HR 99) can we change this to another medication? Please advise

## 2019-06-12 NOTE — Telephone Encounter (Signed)
Yes.  Let us stop the lisinopril and try Cozaar 25 mg po daily disp number 30 with 3 refills.  Add lisinopril to allergy list.

## 2019-06-25 ENCOUNTER — Ambulatory Visit: Payer: Medicaid Other | Admitting: Podiatry

## 2019-06-25 ENCOUNTER — Other Ambulatory Visit: Payer: Self-pay

## 2019-06-25 ENCOUNTER — Ambulatory Visit (INDEPENDENT_AMBULATORY_CARE_PROVIDER_SITE_OTHER): Payer: Medicaid Other

## 2019-06-25 DIAGNOSIS — M76822 Posterior tibial tendinitis, left leg: Secondary | ICD-10-CM

## 2019-06-25 DIAGNOSIS — M216X2 Other acquired deformities of left foot: Secondary | ICD-10-CM

## 2019-06-25 DIAGNOSIS — M7662 Achilles tendinitis, left leg: Secondary | ICD-10-CM

## 2019-06-25 DIAGNOSIS — M19072 Primary osteoarthritis, left ankle and foot: Secondary | ICD-10-CM

## 2019-06-25 DIAGNOSIS — M659 Synovitis and tenosynovitis, unspecified: Secondary | ICD-10-CM

## 2019-06-30 NOTE — Progress Notes (Signed)
   Subjective:  63 y.o. female presenting today for follow up evaluation of left foot and ankle pain. She states her symptoms have worsened slightly. Being on the foot increases the pain. She has been taking OTC Tylenol for treatment. Patient is here for further evaluation and treatment.   Past Medical History:  Diagnosis Date  . Asthma   . Chronic kidney disease    cyst in left kidney  . Colon cancer ([place]) 2017   surgical tx  removal of polyp  . Coronary artery calcification    a. POET in 2015 with no acute findings.  . Diarrhea since 02-16-17   c dif test normal per pt  . Dyspnea     with exertion, occ at rest no oxygen or inhaler use  . GERD (gastroesophageal reflux disease)   . H/O: rheumatic fever as child  . Headache    occ left side migraine  . Hypertension   . Lung disease    Lung nodule  . 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 Pneumonia 2016  . Rheumatoid arthritis ([place])    oa also, back arthritis, hx rheumatoid        Objective/Physical Exam General: The patient is alert and oriented x3 in no acute distress.  Dermatology: Skin is warm, dry and supple bilateral lower extremities. Negative for open lesions or macerations.  Vascular: Palpable pedal pulses bilaterally. No edema or erythema noted. Capillary refill within normal limits.  Neurological: Epicritic and protective threshold grossly intact bilaterally.   Musculoskeletal Exam: Range of motion within normal limits to all pedal and ankle joints bilateral. Muscle strength 5/5 in all groups bilateral.  Upon weightbearing there is a medial longitudinal arch collapse bilaterally. Remove foot valgus noted to the bilateral lower extremities with excessive pronation upon mid stance. Pain on palpation noted to the posterior tibial tendon of the left foot.   Radiographic Exam:  Normal osseous mineralization. Joint spaces preserved. No fracture/dislocation/boney destruction.   Pes planus  noted on radiographic exam lateral views. Decreased calcaneal inclination and metatarsal declination angle is noted. Anterior break in the cyma line noted on lateral views. Medial talar head to deviation noted on AP radiograph.   MRI Impression:  The posterior tibialis tendon is normally positioned and without evidence for a tear. However, tendinosis is noted in the region of the insertion with slight stress osteoedema within the navicular tubercle identified. Subtle enthesopathy is noted at the posterior tibialis tendon insertion. The flexor digitorum longus and flexor hallucis longus tendons are normal.   Assessment: 1. pes planus bilateral 2. Insertional posterior tibial tendinitis left    Plan of Care:  1. Patient was evaluated. X-Rays and MRI reviewed.  2. Prescription for custom orthotics provided to patient to take to [facility].  3. Continue using ankle brace daily.  4. Continue taking OTC Tylenol.  5. OTC insoles provided.  6. Return to clinic as needed.    Edrick Kins, DPM [facility]  Dr. Edrick Kins, [address]                                        [place], [place] [address]                Office [phone]  Fax [phone]

## 2019-07-19 ENCOUNTER — Other Ambulatory Visit: Payer: Self-pay

## 2019-07-19 ENCOUNTER — Encounter: Payer: Self-pay | Admitting: Allergy

## 2019-07-19 ENCOUNTER — Ambulatory Visit: Payer: Medicaid Other | Admitting: Allergy

## 2019-07-19 VITALS — BP 132/68 | HR 91 | Temp 97.4°F | Resp 18 | Ht <= 58 in

## 2019-07-19 DIAGNOSIS — R05 Cough: Secondary | ICD-10-CM | POA: Diagnosis not present

## 2019-07-19 DIAGNOSIS — R059 Cough, unspecified: Secondary | ICD-10-CM

## 2019-07-19 DIAGNOSIS — J3089 Other allergic rhinitis: Secondary | ICD-10-CM

## 2019-07-19 NOTE — Progress Notes (Signed)
Follow-up Note  RE: Kendra Gilbert MRN: [id number] DOB: 1956/03/27 Date of Office Visit: 07/19/2019   History of present illness: Kendra Gilbert is a 64 y.o. female presenting today for follow-up of cough and allergic rhinitis with post-nasal drainage.  She was last seen in the office on 06/07/2019 by myself and medicine resident, Dr. Gilford Rile.    Her lisinopril was stopped and changed to Losartan.  She states she does feel the left-side throat itch is a bit better and her cough is slightly reduced with this change.   She does report however when she uses the symbicort inhaler and the nasal spray back to back in the mornings before she walks outside she feels like she has a little bit of chest pain/tightness and some difficulty breathing when she does become active.   She is unsure if is the combination of the 2 medications with going outside in the cold weather.  She has not tried to space these medications out from one another yet.  She also reports that when she uses the DuoNeb vials of the nebulizer that seems to cause some GI discomfort and increase her IBS symptoms.  She states this does not happen when she has used her albuterol inhaler. She does feel the Xyzal has been helpful in decreasing the amount of wheezing she was doing previously and she also states that her itchy eye is a bit improved as well with the Xyzal.  She also states that yesterday when she was doing her nasal saline rinse that she feels temperature was a bit too warm and she developed eye swelling and leg swelling afterwards.  She states she does have quite sensitive temperature sensitivity.  She normally uses the saline rinse at a cooler temp.  She used the same amount of the salt solution in the saline rinse that she is normally done previously.  She does feel however that the left side of her nose now seems to be a bit drier and more clogged feeling than before.  Review of systems: Review of Systems    Constitutional: Negative.   HENT: Positive for congestion and sore throat. Negative for ear discharge, ear pain, nosebleeds and sinus pain.   Eyes: Negative for pain, discharge and redness.  Respiratory: Positive for cough.   Cardiovascular: Negative.   Gastrointestinal: Positive for abdominal pain.  Musculoskeletal: Positive for joint pain.  Skin: Negative.   Neurological: Negative.     All other systems negative unless noted above in HPI  Past medical/social/surgical/family history have been reviewed and are unchanged unless specifically indicated below.  No changes  Medication List: Current Outpatient Medications  Medication Sig Dispense Refill  . acetaminophen (TYLENOL) 325 MG tablet Take 325 mg by mouth daily as needed for mild pain, fever or headache.    . albuterol (PROVENTIL HFA) 108 (90 Base) MCG/ACT inhaler Inhale 2 puffs into the lungs every 4 (four) hours as needed for wheezing or shortness of breath. 18 g 2  . azelastine (ASTELIN) 0.1 % nasal spray Use 2 sprays in each nostrils twice daily 30 mL 5  . budesonide-formoterol (SYMBICORT) 160-4.5 MCG/ACT inhaler Inhale 2 puffs into the lungs twice daily with spacer 1 Inhaler 5  . diphenhydrAMINE (BENADRYL) 25 MG tablet Take 25 mg by mouth every 6 (six) hours as needed.    . gabapentin (NEURONTIN) 100 MG capsule Take 1 capsule (100 mg total) by mouth 3 (three) times daily. 90 capsule 3  . ipratropium-albuterol (DUONEB) 0.5-2.5 (3) MG/3ML SOLN Use  1 vial via nebulizer every 4-6 hours as needed for cough, wheeze, shortness of breath or chest tightness 180 mL 1  . losartan (COZAAR) 25 MG tablet Take 1 tablet (25 mg total) by mouth daily. 30 tablet 3  . Menthol (HONEY LEMON COUGH DROPS MT) Use as directed 1 lozenge in the mouth or throat every 12 (twelve) hours as needed (for sore throat and cough).    . NONFORMULARY OR COMPOUNDED ITEM [facility]  Diclofenac, Baclofen, Gabapentin, Lidocaine, Menthol 100mg  2 refills    .  TRAMADOL HCL PO      No current facility-administered medications for this visit.     Known medication allergies: Allergies  Allergen Reactions  . Amlodipine Shortness Of Breath and Swelling  . Bee Venom Anaphylaxis and Hives  . Lisinopril Itching and Cough    Dry cough and severe itching in the throat   . Other Other (See Comments)    Mosquito bites cause hives all over body  . Aspirin Other (See Comments)    Causes stomach problems  . Erythromycin Other (See Comments)    Ate the lining of her stomach  . Hctz [Hydrochlorothiazide]     Rash   . Penicillins Rash    Has patient had a PCN reaction causing immediate rash, facial/tongue/throat swelling, SOB or lightheadedness with hypotension: Yes Has patient had a PCN reaction causing severe rash involving mucus membranes or skin necrosis: Yes Has patient had a PCN reaction that required hospitalization: No  Has patient had a PCN reaction occurring within the last 10 years: No If all of the above answers are "NO", then may proceed with Cephalosporin use.      Physical examination: Blood pressure 132/68, pulse 91, temperature (!) 97.4 F (36.3 C), temperature source Temporal, resp. rate 18, height 4' 9.25" (1.454 m), SpO2 97 %.  General: Alert, interactive, in no acute distress. HEENT: PERRLA, TMs pearly gray, turbinates mildly edematous without discharge, post-pharynx non erythematous. Neck: Supple without lymphadenopathy. Lungs: Clear to auscultation without wheezing, rhonchi or rales. {no increased work of breathing. CV: Normal S1, S2 without murmurs. Abdomen: Nondistended, nontender. Skin: Warm and dry, without lesions or rashes. Extremities:  No clubbing, cyanosis or edema. Neuro:   Grossly intact.  Diagnositics/Labs: Spirometry: FEV1: 1.12L 61%, FVC: 1.4L 58%, ratio consistent with Moderate restriction.  This study however is an improvement over her initial study  Assessment and plan:   Allergic rhinitis with  significant postnasal drainage - continue avoidance measures for grass pollens, weed pollens, tree pollens, mold, dust mites - continue Xyzal 5mg  daily.   - recommend performing nasal saline rinses as needed.  Ensure appropriate temperature of water before proceeding.  Breathe through your mouth while performing saline rinse.   - for nasal drainage continue use of nasal antihistamine, Astelin 2 sprays each nostril as needed at this time.   Cough  -Seems to be multifactorial with postnasal drainage being a contributor.  She has had a decrease in cough symptoms with switch of lisinopril to a different agent.  Lung function testing does look better today however still reduced. - have access to albuterol inhaler 2 puffs or albuterol 1 vial via nebulizer every 4-6 hours as needed for cough/wheeze/shortness of breath/chest tightness.  May use 15-20 minutes prior to activity.   Monitor frequency of use.    Use inhaler with spacer device.  Will have you try plain albuterol vials and see if this does not cause any GI symptoms after use and if it doesn't  cause GI discomfort then call us and let us know so can we order your more vials.  Stop use of Duoneb vials at this time.  - continue Symbicort 2 puffs twice a day (use with spacer) to help with cough and improve lung function    Follow-up in 3-4 months or sooner if needed   I appreciate the opportunity to take part in Arynn's care. Please do not hesitate to contact me with questions.  Sincerely,   Prudy Feeler, MD Allergy/Immunology [facility]

## 2019-07-19 NOTE — Patient Instructions (Addendum)
-   continue avoidance measures for grass pollens, weed pollens, tree pollens, mold, dust mites - continue Xyzal 5mg  daily.   - recommend performing nasal saline rinses as needed.  Ensure appropriate temperature of water before proceeding.  Breathe through your mouth while performing saline rinse.   - for nasal drainage continue use of nasal antihistamine, Astelin 2 sprays each nostril as needed at this time.   - have access to albuterol inhaler 2 puffs or albuterol 1 vial via nebulizer every 4-6 hours as needed for cough/wheeze/shortness of breath/chest tightness.  May use 15-20 minutes prior to activity.   Monitor frequency of use.    Use inhaler with spacer device.  Will have you try plain albuterol vials and see if this does not cause any GI symptoms after use and if it doesn't cause GI discomfort then call us and let us know so can we order your more vials.  Stop use of Duoneb vials at this time.  - continue Symbicort 2 puffs twice a day (use with spacer) to help with cough and improve lung function  - lung function today is improved from previous visit!  Follow-up in 3-4 months or sooner if needed

## 2019-08-25 DIAGNOSIS — Z7189 Other specified counseling: Secondary | ICD-10-CM | POA: Insufficient documentation

## 2019-08-25 DIAGNOSIS — R600 Localized edema: Secondary | ICD-10-CM | POA: Insufficient documentation

## 2019-08-25 NOTE — Progress Notes (Signed)
Cardiology Office Note   Date:  08/27/2019   ID:  Kendra, Gilbert 05/06/1956, MRN [id number]  PCP:  Leighton Ruff, MD  Cardiologist:   Minus Breeding, MD   Chief Complaint  Patient presents with  . Leg Swelling      History of Present Illness: Kendra Gilbert is a 64 y.o. female who presents for follow up of HTN.  She was intolerant of lisinopril which caused some cough.  We switched her to Cozaar.  She brings a blood pressure diary and her blood pressures are very well controlled.  She still has some lower extremity swelling progresses as the day goes on.  She has some mild cough that is being managed.  However, she overall feels relatively well.  She is unfortunately gained about 10 pounds in the last several months.  She was walking 7 days a week and now she is only walking twice a week because of some foot pain.  She had one episode of chest discomfort and fatigue when she had an ice summer but that was associated with some high blood pressure but overall otherwise no cardiac complaints.    Past Medical History:  Diagnosis Date  . Asthma   . Chronic kidney disease    cyst in left kidney  . Colon cancer ([place]) 2017   surgical tx  removal of polyp  . Coronary artery calcification    a. POET in 2015 with no acute findings.  . Diarrhea since 02-16-17   c dif test normal per pt  . Dyspnea     with exertion, occ at rest no oxygen or inhaler use  . GERD (gastroesophageal reflux disease)   . H/O: rheumatic fever as child  . Headache    occ left side migraine  . Hypertension   . Lung disease    Lung nodule  . 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 Pneumonia 2016  . Rheumatoid arthritis ([place])    oa also, back arthritis, hx rheumatoid     Past Surgical History:  Procedure Laterality Date  . ABDOMINAL HYSTERECTOMY     ovaries left  . COLONOSCOPY WITH PROPOFOL N/A 03/23/2017   Procedure: COLONOSCOPY WITH PROPOFOL;  Surgeon: Arta Silence, MD;  Location: [facility];  Service: Endoscopy;  Laterality: N/A;  . colonscopy  2017  . DILATION AND CURETTAGE OF UTERUS     few done for endometriosis     Current Outpatient Medications  Medication Sig Dispense Refill  . acetaminophen (TYLENOL) 325 MG tablet Take 325 mg by mouth daily as needed for mild pain, fever or headache.    . albuterol (PROVENTIL HFA) 108 (90 Base) MCG/ACT inhaler Inhale 2 puffs into the lungs every 4 (four) hours as needed for wheezing or shortness of breath. 18 g 2  . azelastine (ASTELIN) 0.1 % nasal spray Use 2 sprays in each nostrils twice daily 30 mL 5  . budesonide-formoterol (SYMBICORT) 160-4.5 MCG/ACT inhaler Inhale 2 puffs into the lungs twice daily with spacer 1 Inhaler 5  . diphenhydrAMINE (BENADRYL) 25 MG tablet Take 25 mg by mouth every 6 (six) hours as needed.    . gabapentin (NEURONTIN) 100 MG capsule Take 1 capsule (100 mg total) by mouth 3 (three) times daily. 90 capsule 3  . ipratropium-albuterol (DUONEB) 0.5-2.5 (3) MG/3ML SOLN Use 1 vial via nebulizer every 4-6 hours as needed for cough, wheeze, shortness of breath or chest tightness 180 mL 1  . losartan (COZAAR) 25  MG tablet Take 1 tablet (25 mg total) by mouth daily. 30 tablet 11  . TRAMADOL HCL PO      No current facility-administered medications for this visit.    Allergies:   Amlodipine, Bee venom, Lisinopril, Other, Aspirin, Erythromycin, Hctz [hydrochlorothiazide], and Penicillins    ROS:  Please see the history of present illness.   Otherwise, review of systems are positive for none.   All other systems are reviewed and negative.    PHYSICAL EXAM: VS:  BP 132/72   Pulse 71   Temp (!) 97.1 F (36.2 C) (Other (Comment)) Comment (Src): Forehead  Ht 4\' 11"  (1.499 m)   Wt 184 lb 12.8 oz (83.8 kg)   SpO2 97%   BMI 37.33 kg/m  , BMI Body mass index is 37.33 kg/m. GENERAL:  Well appearing NECK:  No jugular venous distention, waveform within normal limits, carotid  upstroke brisk and symmetric, no bruits, no thyromegaly LUNGS:  Clear to auscultation bilaterally HEART:  PMI not displaced or sustained,S1 and S2 within normal limits, no S3, no S4, no clicks, no rubs, no murmurs ABD:  Flat, positive bowel sounds normal in frequency in pitch, no bruits, no rebound, no guarding, no midline pulsatile mass, no hepatomegaly, no splenomegaly EXT:  2 plus pulses throughout, no edema, no cyanosis no clubbing   EKG:  EKG is ordered today. The ekg ordered today demonstrates sinus rhythm, rate 71, axis within normal limits, intervals within normal limits, no acute ST-T wave changes.   Recent Labs: 09/28/2018: TSH 2.670 02/09/2019: BUN 17; Creatinine, Ser 0.66; Potassium 3.8; Sodium 143    Lipid Panel    Component Value Date/Time   CHOL 228 (H) 02/06/2016 0930   TRIG 93 02/06/2016 0930   HDL 69 02/06/2016 0930   CHOLHDL 3.3 02/06/2016 0930   VLDL 19 02/06/2016 0930   LDLCALC 140 (H) 02/06/2016 0930   LDLDIRECT 135.2 08/23/2013 1142      Wt Readings from Last 3 Encounters:  08/27/19 184 lb 12.8 oz (83.8 kg)  06/07/19 180 lb 3.2 oz (81.7 kg)  05/08/19 170 lb (77.1 kg)      Other studies Reviewed: Additional studies/ records that were reviewed today include: None. Review of the above records demonstrates:  Please see elsewhere in the note.     ASSESSMENT AND PLAN:  HTN-     the blood pressure is well controlled.  No change in therapy.   Edema- this is mild and at baseline.  No change in therapy.  Obesity-       She cannot understand why she gained weight but it probably has something to do with her decrease in activity with her foot pain.  We talked about alternatives to walking as an exercise.   Covid education: She is not interested in the vaccine but we did discuss it today.   Current medicines are reviewed at length with the patient today.  The patient does not have concerns regarding medicines.  The following changes have been made:  no  change  Labs/ tests ordered today include: None  Orders Placed This Encounter  Procedures  . EKG 12-Lead     Disposition:   FU with me as needed.     Signed, Minus Breeding, MD  08/27/2019 9:42 AM    [facility]

## 2019-08-27 ENCOUNTER — Encounter: Payer: Self-pay | Admitting: Cardiology

## 2019-08-27 ENCOUNTER — Ambulatory Visit: Payer: Medicaid Other | Admitting: Cardiology

## 2019-08-27 ENCOUNTER — Other Ambulatory Visit: Payer: Self-pay

## 2019-08-27 VITALS — BP 132/72 | HR 71 | Temp 97.1°F | Ht 59.0 in | Wt 184.8 lb

## 2019-08-27 DIAGNOSIS — Z7189 Other specified counseling: Secondary | ICD-10-CM | POA: Diagnosis not present

## 2019-08-27 DIAGNOSIS — I10 Essential (primary) hypertension: Secondary | ICD-10-CM | POA: Diagnosis not present

## 2019-08-27 DIAGNOSIS — R600 Localized edema: Secondary | ICD-10-CM

## 2019-08-27 MED ORDER — LOSARTAN POTASSIUM 25 MG PO TABS: 25.0000 mg | ORAL_TABLET | Freq: Every day | ORAL | 11 refills | Status: DC

## 2019-08-27 NOTE — Patient Instructions (Signed)
Medication Instructions:  Losartan refilled *If you need a refill on your cardiac medications before your next appointment, please call your pharmacy*  Lab Work: None If you have labs (blood work) drawn today and your tests are completely normal, you will receive your results only by: Marland Kitchen MyChart Message (if you have MyChart) OR . A paper copy in the mail If you have any lab test that is abnormal or we need to change your treatment, we will call you to review the results.  Testing/Procedures: None  Follow-Up: At [facility], you and your health needs are our priority.  As part of our continuing mission to provide you with exceptional heart care, we have created designated Provider Care Teams.  These Care Teams include your primary Cardiologist (physician) and Advanced Practice Providers (APPs -  Physician Assistants and Nurse Practitioners) who all work together to provide you with the care you need, when you need it.  Your next appointment:   Follow up as needed

## 2019-10-17 ENCOUNTER — Ambulatory Visit: Payer: Medicaid Other | Admitting: Allergy

## 2019-10-17 ENCOUNTER — Other Ambulatory Visit: Payer: Self-pay

## 2019-10-17 ENCOUNTER — Encounter: Payer: Self-pay | Admitting: Allergy

## 2019-10-17 VITALS — BP 118/80 | HR 98 | Temp 97.8°F | Resp 18 | Ht <= 58 in | Wt 185.6 lb

## 2019-10-17 DIAGNOSIS — J3089 Other allergic rhinitis: Secondary | ICD-10-CM | POA: Diagnosis not present

## 2019-10-17 DIAGNOSIS — J4550 Severe persistent asthma, uncomplicated: Secondary | ICD-10-CM

## 2019-10-17 NOTE — Patient Instructions (Addendum)
-   continue avoidance measures for grass pollens, weed pollens, tree pollens, mold, dust mites - continue Xyzal 5mg  daily.   - recommend performing nasal saline rinses as needed.  Ensure appropriate temperature of water before proceeding.  Breathe through your mouth while performing saline rinse.   - for nasal drainage continue use of nasal antihistamine, Astelin 2 sprays each nostril as needed at this time.   - have access to albuterol inhaler 2 puffs or albuterol 1 vial via nebulizer every 4-6 hours as needed for cough/wheeze/shortness of breath/chest tightness.  May use 15-20 minutes prior to activity.   Monitor frequency of use.    Use inhaler with spacer device.  Will have you try plain albuterol vials and see if this does not cause any GI symptoms after use and if it doesn't cause GI discomfort then call us and let us know so can we order your more vials.  Stop use of Duoneb vials at this time.  - stop Symbicort  - start Breztri 2 puffs twice a day.  This is a triple therapy inhaler which should help decrease phlegm production.  2 week sample provided today.  Let us know if this inhaler helps moreso than Symbicort--- call the office and let us know - will obtain IgE level and eosinophil level to determine what medications you may qualify for in stepping up therapy - lung function today is improved from previous visit!  Follow-up in 3-4 months or sooner if needed

## 2019-10-17 NOTE — Progress Notes (Signed)
Follow-up Note  RE: Kendra Gilbert MRN: [id number] DOB: May 03, 1956 Date of Office Visit: 10/17/2019   History of present illness: Kendra Gilbert is a 64 y.o. female presenting today for follow-up of cough with allergic rhinitis with postnasal drainage.  She was last seen in the office on 07/19/2019 by myself.  She states since the seasons and weather has changed with introduction of tree pollen and warmer weather that she feels she is having more symptoms lately.  She states she does well at night but the daytime and when she is moving about being more active is when she has more issues with cough, wheeze and difficulty breathing at times.  She states she has not been able to go back to doing her daily walks that she used to do prior to the onset of all of the symptoms.  She states that with the coughing she is coughing up clear phlegm a lot during the day and does feel short of breath as well.  She is using her rescue inhaler more at this time but she does get some relief of symptoms with use.  She is doing the Symbicort 2 puffs twice a day with a spacer and she feels like it has helped since initiation of but again with the change in weather she is having more issues currently.  In regards to her allergy symptom control she does continue Xyzal daily and is unsure if it is really providing much benefit.  She continues to perform her nasal saline rinses and does use Astelin to help with nasal drainage which she states does help some.  Review of systems: Review of Systems  Constitutional: Negative.   HENT:       See HPI  Eyes: Negative.   Respiratory:       See HPI  Cardiovascular: Negative.   Gastrointestinal: Negative.   Musculoskeletal: Negative.   Skin: Negative.   Neurological: Negative.     All other systems negative unless noted above in HPI  Past medical/social/surgical/family history have been reviewed and are unchanged unless specifically indicated below.  No  changes  Medication List: Current Outpatient Medications  Medication Sig Dispense Refill  . albuterol (PROVENTIL HFA) 108 (90 Base) MCG/ACT inhaler Inhale 2 puffs into the lungs every 4 (four) hours as needed for wheezing or shortness of breath. 18 g 2  . azelastine (ASTELIN) 0.1 % nasal spray Use 2 sprays in each nostrils twice daily 30 mL 5  . budesonide-formoterol (SYMBICORT) 160-4.5 MCG/ACT inhaler Inhale 2 puffs into the lungs twice daily with spacer 1 Inhaler 5  . diphenhydrAMINE (BENADRYL) 25 MG tablet Take 25 mg by mouth every 6 (six) hours as needed.    Marland Kitchen ipratropium-albuterol (DUONEB) 0.5-2.5 (3) MG/3ML SOLN Use 1 vial via nebulizer every 4-6 hours as needed for cough, wheeze, shortness of breath or chest tightness 180 mL 1  . loratadine (CLARITIN) 10 MG tablet Take 10 mg by mouth daily.    . Sodium Chloride-Sodium Bicarb 1.57 g PACK Place into the nose.    Bethann Humble Sulfate (VISINE-AC OP) Apply 1 drop to eye in the morning, at noon, in the evening, and at bedtime.    Marland Kitchen acetaminophen (TYLENOL) 325 MG tablet Take 325 mg by mouth daily as needed for mild pain, fever or headache.    . gabapentin (NEURONTIN) 100 MG capsule Take 1 capsule (100 mg total) by mouth 3 (three) times daily. 90 capsule 3  . losartan (COZAAR) 25 MG tablet Take 1 tablet (  25 mg total) by mouth daily. 30 tablet 11  . TRAMADOL HCL PO      No current facility-administered medications for this visit.     Known medication allergies: Allergies  Allergen Reactions  . Amlodipine Shortness Of Breath and Swelling  . Bee Venom Anaphylaxis and Hives  . Lisinopril Itching and Cough    Dry cough and severe itching in the throat   . Other Other (See Comments)    Mosquito bites cause hives all over body  . Aspirin Other (See Comments)    Causes stomach problems  . Erythromycin Other (See Comments)    Ate the lining of her stomach  . Hctz [Hydrochlorothiazide]     Rash   . Penicillins Rash    Has patient  had a PCN reaction causing immediate rash, facial/tongue/throat swelling, SOB or lightheadedness with hypotension: Yes Has patient had a PCN reaction causing severe rash involving mucus membranes or skin necrosis: Yes Has patient had a PCN reaction that required hospitalization: No  Has patient had a PCN reaction occurring within the last 10 years: No If all of the above answers are "NO", then may proceed with Cephalosporin use.      Physical examination: Blood pressure 118/80, pulse 98, temperature 97.8 F (36.6 C), temperature source Temporal, resp. rate 18, height 4' 9.5" (1.461 m), weight 185 lb 9.6 oz (84.2 kg), SpO2 98 %.  General: Alert, interactive, in no acute distress. HEENT: PERRLA, TMs pearly gray, turbinates mildly edematous without discharge, post-pharynx non erythematous. Neck: Supple without lymphadenopathy. Lungs: Mildly decreased breath sounds with expiratory wheezing bilaterally. {no increased work of breathing. CV: Normal S1, S2 without murmurs. Abdomen: Nondistended, nontender. Skin: Warm and dry, without lesions or rashes. Extremities:  No clubbing, cyanosis or edema. Neuro:   Grossly intact.  Diagnositics/Labs:  Spirometry: FEV1: 1.24 L 68%, FVC: 1.5 L 63%, ratio consistent with Restrictive pattern.  This is a slight improvement in lung function from her previous study.  Assessment and plan: Allergic rhinitis with postnasal drainage - continue avoidance measures for grass pollens, weed pollens, tree pollens, mold, dust mites - continue Xyzal 5mg  daily.   - recommend performing nasal saline rinses as needed.  Ensure appropriate temperature of water before proceeding.  Breathe through your mouth while performing saline rinse.   - for nasal drainage continue use of nasal antihistamine, Astelin 2 sprays each nostril as needed at this time.   Cough/severe persistent asthma - have access to albuterol inhaler 2 puffs or albuterol 1 vial via nebulizer every 4-6 hours  as needed for cough/wheeze/shortness of breath/chest tightness.  May use 15-20 minutes prior to activity.   Monitor frequency of use.    Use inhaler with spacer device.  Will have you try plain albuterol vials and see if this does not cause any GI symptoms after use and if it doesn't cause GI discomfort then call us and let us know so can we order your more vials.  Stop use of Duoneb vials at this time.  - stop Symbicort  - start Breztri 2 puffs twice a day.  This is a triple therapy inhaler which should help decrease phlegm production.  2 week sample provided today.  Let us know if this inhaler helps moreso than Symbicort--- call the office and let us know - will obtain IgE level and eosinophil level to determine what medications you may qualify for in stepping up therapy - lung function today is improved from previous visit!  Follow-up in 3-4 months  or sooner if needed   I appreciate the opportunity to take part in Kendra Gilbert's care. Please do not hesitate to contact me with questions.  Sincerely,   Prudy Feeler, MD Allergy/Immunology [facility] [place] [facility]

## 2019-10-20 LAB — CBC WITH DIFFERENTIAL
Basophils Absolute: 0 10*3/uL (ref 0.0–0.2)
Basos: 0 %
EOS (ABSOLUTE): 0.1 10*3/uL (ref 0.0–0.4)
Eos: 1 %
Hematocrit: 40.6 % (ref 34.0–46.6)
Hemoglobin: 13.7 g/dL (ref 11.1–15.9)
Immature Grans (Abs): 0 10*3/uL (ref 0.0–0.1)
Immature Granulocytes: 0 %
Lymphocytes Absolute: 1.6 10*3/uL (ref 0.7–3.1)
Lymphs: 21 %
MCH: 28.2 pg (ref 26.6–33.0)
MCHC: 33.7 g/dL (ref 31.5–35.7)
MCV: 84 fL (ref 79–97)
Monocytes Absolute: 0.5 10*3/uL (ref 0.1–0.9)
Monocytes: 7 %
Neutrophils Absolute: 5.2 10*3/uL (ref 1.4–7.0)
Neutrophils: 71 %
RBC: 4.86 x10E6/uL (ref 3.77–5.28)
RDW: 13.1 % (ref 11.7–15.4)
WBC: 7.4 10*3/uL (ref 3.4–10.8)

## 2019-10-20 LAB — IGE: IgE (Immunoglobulin E), Serum: 578 IU/mL — ABNORMAL HIGH (ref 6–495)

## 2019-11-01 ENCOUNTER — Telehealth: Payer: Self-pay

## 2019-11-01 ENCOUNTER — Telehealth: Payer: Self-pay | Admitting: *Deleted

## 2019-11-01 MED ORDER — BREZTRI AEROSPHERE 160-9-4.8 MCG/ACT IN AERO: 2.0000 | INHALATION_SPRAY | Freq: Two times a day (BID) | RESPIRATORY_TRACT | 5 refills | Status: DC

## 2019-11-01 NOTE — Telephone Encounter (Signed)
Ok this makes sense then.   Her symptoms are not unusual and are commonly seen after the second vaccine dose.  They will resolve with time.   Yes perfect for HC and ice packs as well.

## 2019-11-01 NOTE — Telephone Encounter (Addendum)
Patient received her COVID vaccine on Tuesday. She had vomiting, high grade fever of 105.0, body aches, chills, headaches and fatigue yesterday. She says that her entire upper arm is swollen. She has taken Benadryl and Tylenol, which helped some with the fever. Today she is feeling "much cooler", less body aches and chills. She does still have some nausea, loss of appetite and sleepiness. I told her to take tylenol every 4 hours as needed like she would normally if needed. She can also do cold compresses for the arm swelling.I told her that in the event her fever returns and is higher than 103 and is not lowered with fever reducing medications to go to the emergency room or urgent care for medical treatment as that is a dangerously high temperature. I asked that she call back today if anything new occurred or her symptoms worsened. I also asked that she call tomorrow around mid-morning with an update. Patient verbalized understanding.

## 2019-11-01 NOTE — Telephone Encounter (Signed)
Patient called back and stated that this was her second vaccine. She stated that she was just concerned because of other things she has had going on. She stated that her arm is swollen, red, and hot to the touch and has radiated down to her elbow and underneath her arm towards the axillae. Advised to patient that she can put hydrocortisone cream on her arm and apply ice packs to help with the swelling. Patient verbalized understanding.

## 2019-11-01 NOTE — Telephone Encounter (Signed)
Called and left a voicemail asking for to return to inform.

## 2019-11-01 NOTE — Telephone Encounter (Signed)
PA has been denied. Currently waiting for fax retrieval from the insurance company to determine the next available options.

## 2019-11-01 NOTE — Telephone Encounter (Signed)
Was this her first or second vaccine dose?  These all sound like typical symptoms related to building immune response from the vaccine.  Agree with tylenol as needed use, rest and hydration.  These symptoms are usually fleeting and resolved by 36-48hrs after vaccine.

## 2019-11-01 NOTE — Telephone Encounter (Signed)
PA has been submitted through [employer] for Breztri and is currently suspended/ pending.  °

## 2019-11-02 NOTE — Telephone Encounter (Signed)
Called and advised patient through voicemail per DPR permission.

## 2019-11-02 NOTE — Telephone Encounter (Signed)
Patient called back and stated that she is feeling much better she states she is not having any fevers or vomiting, just nausea. She states that her arm is still bothering her and she searched the symptoms and apparantly it is called the "COVID arm". She did put on hydrocortisone cream and ice yesterday but she could not tolerate the ice for long yesterday since she was having cold chills. Advised for to try again since she is feeling better today and see if she can apply the ice for longer. Patient verbalized understanding. Is there anything else you may recommend?

## 2019-11-02 NOTE — Telephone Encounter (Signed)
No.   That sounds good.  Thanks Ashleigh.

## 2019-11-06 NOTE — Telephone Encounter (Signed)
Patient called and wanted to check on the status of the PA for [facility]. Informed patient we are waiting for insurance company to determine next options. Patient verbalized understanding.

## 2019-11-08 NOTE — Telephone Encounter (Signed)
Called and left a voicemail asking for patient to return call to inquire about the [facility] inhaler.

## 2019-11-08 NOTE — Telephone Encounter (Signed)
Does she feel the Judithann Sauger works better than symbicort? Breztri chosen in this case as it is symbicort + additional med.  She has several medication allergy/adverse events thus was trying to minimize risk of side effect.   Can we provide her with samples if she feels Judithann Sauger is more effective than symbicort? Otherwise if not would just stick with symbicort.

## 2019-11-08 NOTE — Telephone Encounter (Signed)
PA was denied for [facility] through [employer]. Preferred inhalers are Flovent, Advair, and Dulera. Please advise change in inhaler. Thank You.

## 2019-11-09 NOTE — Telephone Encounter (Signed)
Patient returned phone call and stated that she is still having phlegm but it is not as bad. She states that if the Judithann Sauger is not covered she will switch back to Symbicort. Advised to patient to try for 2 more weeks with the [facility] and give a call with an update. If she feels at that time that the Judithann Sauger is helping more than we can continue samples of Breztri for her since insurance does not cover this. Patient verbalized understanding and will call back with an update.

## 2019-11-14 NOTE — Telephone Encounter (Signed)
Patient called because she received a letter in the mail regarding a denial for the PA for Breztri. Reminded patient that we discussed providing samples of Breztri for her and she just needs to call the office for samples. Asked patient how she was liking the [place] and she stated it was working well for her for now. Advised that if she does not like it and wants to switch back to Symbicort to please call and let us know. Patient verbalized understanding.

## 2019-11-14 NOTE — Telephone Encounter (Signed)
Thank you :)

## 2019-11-27 ENCOUNTER — Other Ambulatory Visit: Payer: Self-pay | Admitting: *Deleted

## 2019-11-27 MED ORDER — AZELASTINE HCL 0.1 % NA SOLN: NASAL | 5 refills | Status: DC

## 2019-11-28 ENCOUNTER — Other Ambulatory Visit: Payer: Self-pay

## 2019-12-12 ENCOUNTER — Other Ambulatory Visit: Payer: Self-pay

## 2019-12-12 MED ORDER — BUDESONIDE-FORMOTEROL FUMARATE 160-4.5 MCG/ACT IN AERO: INHALATION_SPRAY | RESPIRATORY_TRACT | 3 refills | Status: DC

## 2019-12-12 NOTE — Telephone Encounter (Signed)
Patient was notified and verbalized understanding.

## 2019-12-12 NOTE — Telephone Encounter (Signed)
Symbicort sent to pharmacy. Called patient to let her know it was sent and to also let her know that the call from [employer] was regarding either her nebulizer or spacer. Left message on machine for patient to call back.

## 2019-12-12 NOTE — Telephone Encounter (Signed)
Is it ok to send in the Symbicort?

## 2019-12-12 NOTE — Telephone Encounter (Signed)
Yes ok to send in Symbicort.  The [employer] is either regarding a nebulizer or spacer.

## 2019-12-12 NOTE — Telephone Encounter (Signed)
Patient called and states that Kendra Gilbert was working well, but it bothered her stomach. Patient states she would like to continue on Symbicort. Patient has enough until the end of June and has requested a refill through the pharmacy.   Patient also states that [employer] contacted her stating she was approved from insurance and would like to know if we know anything about this. Patient thinks it may be for updating her nebulizer.  Please advise.

## 2020-01-18 ENCOUNTER — Other Ambulatory Visit: Payer: Self-pay | Admitting: Family Medicine

## 2020-01-18 ENCOUNTER — Encounter: Payer: Self-pay | Admitting: Allergy

## 2020-01-18 ENCOUNTER — Ambulatory Visit: Payer: Medicaid Other | Admitting: Allergy

## 2020-01-18 ENCOUNTER — Other Ambulatory Visit: Payer: Self-pay

## 2020-01-18 VITALS — BP 120/78 | HR 95 | Temp 97.7°F | Resp 20 | Ht 59.0 in | Wt 187.8 lb

## 2020-01-18 DIAGNOSIS — J4550 Severe persistent asthma, uncomplicated: Secondary | ICD-10-CM | POA: Diagnosis not present

## 2020-01-18 DIAGNOSIS — I998 Other disorder of circulatory system: Secondary | ICD-10-CM

## 2020-01-18 DIAGNOSIS — J3089 Other allergic rhinitis: Secondary | ICD-10-CM

## 2020-01-18 MED ORDER — IPRATROPIUM-ALBUTEROL 0.5-2.5 (3) MG/3ML IN SOLN: RESPIRATORY_TRACT | 1 refills | Status: DC

## 2020-01-18 MED ORDER — FLOVENT HFA 110 MCG/ACT IN AERO: INHALATION_SPRAY | RESPIRATORY_TRACT | 5 refills | Status: DC

## 2020-01-18 NOTE — Progress Notes (Signed)
Follow-up Note  RE: Kendra Gilbert MRN: [id number] DOB: [birth year] Date of Office Visit: 01/18/2020   History of present illness: Kendra Gilbert is a 64 y.o. female presenting today for follow-up of asthma and allergies.  She was last seen in the office on 10/17/2019 by myself.  She states the summer and hotter months are typically times of the year where her asthma is a lot worse.  She is noticing this now.  She states she has been doing ok at nighttime.  The day is when she has more issues especially if she has to be active.  She states when she walks she gets out of breath.  Using albuterol at this time several times a week.  States she tries not to need to use her albuterol if possible.  She was on registry however her insurance did not provide enough coverage thus she went back to Symbicort 2 puffs twice a day.  In regards to her allergies she states that she does have some nasal obstruction primarily of the right nostril.  She does use the Astelin which helps with nasal drainage but not with the congestion issue.  Continues on Xyzal as well.  She says she also over the weekend had some swelling of her left eyelid as well as some redness and itching.  She states the eye also was a bit watery.  She does have access to [place]. She states she is having issues with her legs due to numbness and weakness.  Saw her PCP yesterday and will be seeing orthopedist soon.   Review of systems: Review of Systems  Constitutional: Negative.   HENT: Positive for congestion.   Eyes:       See HPI  Respiratory: Positive for cough and shortness of breath.   Cardiovascular: Negative.   Gastrointestinal: Negative.   Musculoskeletal:       See HPI  Skin: Negative.   Neurological: Positive for weakness.    All other systems negative unless noted above in HPI  Past medical/social/surgical/family history have been reviewed and are unchanged unless specifically indicated below.  No  changes  Medication List: Current Outpatient Medications  Medication Sig Dispense Refill  . acetaminophen (TYLENOL) 325 MG tablet Take 325 mg by mouth daily as needed for mild pain, fever or headache.    . albuterol (PROVENTIL HFA) 108 (90 Base) MCG/ACT inhaler Inhale 2 puffs into the lungs every 4 (four) hours as needed for wheezing or shortness of breath. 18 g 2  . azelastine (ASTELIN) 0.1 % nasal spray Use 2 sprays in each nostrils twice daily 30 mL 5  . budesonide-formoterol (SYMBICORT) 160-4.5 MCG/ACT inhaler Inhale 2 puffs into the lungs twice daily with spacer 10.2 Inhaler 3  . diphenhydrAMINE (BENADRYL) 25 MG tablet Take 25 mg by mouth every 6 (six) hours as needed.    Marland Kitchen ipratropium-albuterol (DUONEB) 0.5-2.5 (3) MG/3ML SOLN Use 1 vial via nebulizer every 4-6 hours as needed for cough, wheeze, shortness of breath or chest tightness 180 mL 1  . losartan (COZAAR) 25 MG tablet Take 1 tablet (25 mg total) by mouth daily. 30 tablet 11  . Sodium Chloride-Sodium Bicarb 1.57 g PACK Place into the nose.    Bethann Humble Sulfate (VISINE-AC OP) Apply 1 drop to eye in the morning, at noon, in the evening, and at bedtime.    . fluticasone (FLOVENT HFA) 110 MCG/ACT inhaler Inhale two puffs twice daily to prevent cough or wheeze.  Rinse, gargle, and spit after use. 12  g 5  . gabapentin (NEURONTIN) 100 MG capsule Take 1 capsule (100 mg total) by mouth 3 (three) times daily. (Patient not taking: Reported on 01/18/2020) 90 capsule 3  . loratadine (CLARITIN) 10 MG tablet Take 10 mg by mouth daily. (Patient not taking: Reported on 01/18/2020)    . TRAMADOL HCL PO  (Patient not taking: Reported on 01/18/2020)     No current facility-administered medications for this visit.     Known medication allergies: Allergies  Allergen Reactions  . Amlodipine Shortness Of Breath and Swelling  . Bee Venom Anaphylaxis and Hives  . Lisinopril Itching and Cough    Dry cough and severe itching in the throat   .  Other Other (See Comments)    Mosquito bites cause hives all over body  . Aspirin Other (See Comments)    Causes stomach problems  . Erythromycin Other (See Comments)    Ate the lining of her stomach  . Hctz [Hydrochlorothiazide]     Rash   . Penicillins Rash    Has patient had a PCN reaction causing immediate rash, facial/tongue/throat swelling, SOB or lightheadedness with hypotension: Yes Has patient had a PCN reaction causing severe rash involving mucus membranes or skin necrosis: Yes Has patient had a PCN reaction that required hospitalization: No  Has patient had a PCN reaction occurring within the last 10 years: No If all of the above answers are "NO", then may proceed with Cephalosporin use.      Physical examination: Blood pressure 120/78, pulse 95, temperature 97.7 F (36.5 C), resp. rate 20, height 4\' 11"  (1.499 m), weight 187 lb 12.8 oz (85.2 kg), SpO2 97 %.  General: Alert, interactive, in no acute distress. HEENT: PERRLA, TMs pearly gray, turbinates moderately edematous without discharge, post-pharynx non erythematous. Neck: Supple without lymphadenopathy. Lungs: Mildly decreased breath sounds with expiratory wheezing bilaterally. {no increased work of breathing. CV: Normal S1, S2 without murmurs. Abdomen: Nondistended, nontender. Skin: Warm and dry, without lesions or rashes. Extremities:  No clubbing, cyanosis or edema. Neuro:   Grossly intact.  Diagnositics/Labs: Labs:  Component     Latest Ref Rng & Units 10/17/2019  WBC     3.4 - 10.8 x10E3/uL 7.4  RBC     3.77 - 5.28 x10E6/uL 4.86  Hemoglobin     11.1 - 15.9 g/dL 13.7  HCT     34.0 - 46.6 % 40.6  MCV     79 - 97 fL 84  MCH     26.6 - 33.0 pg 28.2  MCHC     31 - 35 g/dL 33.7  RDW     11.7 - 15.4 % 13.1  Neutrophils     Not Estab. % 71  Lymphs     Not Estab. % 21  Monocytes     Not Estab. % 7  Eos     Not Estab. % 1  Basos     Not Estab. % 0  NEUT#     1 - 7 x10E3/uL 5.2  Lymphocyte #      0 - 3 x10E3/uL 1.6  Monocytes Absolute     0 - 0 x10E3/uL 0.5  EOS (ABSOLUTE)     0.0 - 0.4 x10E3/uL 0.1  Basophils Absolute     0 - 0 x10E3/uL 0.0  Immature Granulocytes     Not Estab. % 0  Immature Grans (Abs)     0.0 - 0.1 x10E3/uL 0.0  IgE (Immunoglobulin E), Serum     6 -  495 IU/mL 578 (H)    Spirometry: FEV1: 1.22 L 60%, FVC: 1.46 L 55% predicted.  Status post albuterol she had a 7% increase in FEV1 to 1.31 L 65%.   Assessment and plan: Allergic rhinitis - continue avoidance measures for grass pollens, weed pollens, tree pollens, mold, dust mites - continue Xyzal 5mg  daily.   - continue performing nasal saline rinses as needed.  Ensure appropriate temperature of water before proceeding.  Breathe through your mouth while performing saline rinse.   - for nasal drainage continue use of nasal antihistamine, Astelin 2 sprays each nostril as needed at this time.  - for nasal congestion/obstruction can use Nasacort 2 sprays each nostril daily for 1-2 weeks at a time before stopping once symptoms improve - for complete nasal obstruction/blockage can use over-the-counter Afrin 2 sprays and wait for 5-15 minutes to allow nasal passages to open.  Once you can breathe more freely then follow-up with the Nasacort.  Do not use Afrin more  Severe persistent asthma  -At this time not under good control - have access to albuterol inhaler 2 puffs or albuterol 1 vial via nebulizer every 4-6 hours as needed for cough/wheeze/shortness of breath/chest tightness.  May use 15-20 minutes prior to activity.   Monitor frequency of use.    Use inhaler with spacer device.   - Symbicort 142mcg 2 puffs twice a day - Start Flovent 17mcg 2 puffs twice a day to your Symbicort.  This is a inhaled steroid only to provide more control at this time - we discussed options to step up therapy more long-term with Xolair injections.  Xolair is an anti-IgE medication (you have rising levels of IgE) that helps manage  allergic asthma.  Benefits, risks and protocol discussed today.   Xolair information provided and can access further online at [employer].  Will have Tammy, our biologic nurse coordinator, call you to discuss further.    Follow-up in 3-4 months or sooner if needed  I appreciate the opportunity to take part in Maleka's care. Please do not hesitate to contact me with questions.  Sincerely,   Prudy Feeler, MD Allergy/Immunology [facility] [place]

## 2020-01-18 NOTE — Patient Instructions (Addendum)
-   continue avoidance measures for grass pollens, weed pollens, tree pollens, mold, dust mites - continue Xyzal 5mg  daily.   - continue performing nasal saline rinses as needed.  Ensure appropriate temperature of water before proceeding.  Breathe through your mouth while performing saline rinse.   - for nasal drainage continue use of nasal antihistamine, Astelin 2 sprays each nostril as needed at this time.  - for nasal congestion/obstruction can use Nasacort 2 sprays each nostril daily for 1-2 weeks at a time before stopping once symptoms improve - for complete nasal obstruction/blockage can use over-the-counter Afrin 2 sprays and wait for 5-15 minutes to allow nasal passages to open.  Once you can breathe more freely then follow-up with the Nasacort.  Do not use Afrin more  - have access to albuterol inhaler 2 puffs or albuterol 1 vial via nebulizer every 4-6 hours as needed for cough/wheeze/shortness of breath/chest tightness.  May use 15-20 minutes prior to activity.   Monitor frequency of use.    Use inhaler with spacer device.   - Symbicort 118mcg 2 puffs twice a day - Start Flovent 181mcg 2 puffs twice a day to your Symbicort.  This is a inhaled steroid only to provide more control at this time - we discussed options to step up therapy more long-term with Xolair injections.  Xolair is an anti-IgE medication (you have rising levels of IgE) that helps manage allergic asthma.  Benefits, risks and protocol discussed today.   Xolair information provided and can access further online at [employer].  Will have Tammy, our biologic nurse coordinator, call you to discuss further.    Follow-up in 3-4 months or sooner if needed

## 2020-01-23 ENCOUNTER — Telehealth: Payer: Self-pay | Admitting: *Deleted

## 2020-01-23 NOTE — Telephone Encounter (Signed)
Spoke to patient and she does want to try Xolair 375mg  every 14 days for her asthma. Will get approval and submit to [facility]

## 2020-01-23 NOTE — Telephone Encounter (Signed)
-----   Message from [place], MD sent at 01/18/2020  1:34 PM EDT ----- Can you discuss Xolair for asthma with the patient.I discussed benefits risk and protocol with her and she appears to be interested in proceeding

## 2020-01-23 NOTE — Telephone Encounter (Signed)
L/m for patient to call me to discuss Xolair start and submit

## 2020-01-24 ENCOUNTER — Ambulatory Visit
Admission: RE | Admit: 2020-01-24 | Discharge: 2020-01-24 | Disposition: A | Discharge status: Home / Self Care | Payer: Medicaid Other | Source: Ambulatory Visit | Attending: Family Medicine | Admitting: Family Medicine

## 2020-01-24 DIAGNOSIS — I998 Other disorder of circulatory system: Secondary | ICD-10-CM

## 2020-01-25 ENCOUNTER — Other Ambulatory Visit: Payer: Self-pay

## 2020-01-25 ENCOUNTER — Ambulatory Visit (HOSPITAL_COMMUNITY)
Admission: RE | Admit: 2020-01-25 | Discharge: 2020-01-25 | Disposition: A | Discharge status: Home / Self Care | Payer: Medicaid Other | Source: Ambulatory Visit | Attending: Vascular Surgery | Admitting: Vascular Surgery

## 2020-01-25 ENCOUNTER — Other Ambulatory Visit (HOSPITAL_COMMUNITY): Payer: Self-pay | Admitting: Family Medicine

## 2020-01-25 DIAGNOSIS — R0989 Other specified symptoms and signs involving the circulatory and respiratory systems: Secondary | ICD-10-CM

## 2020-02-05 ENCOUNTER — Other Ambulatory Visit: Payer: Self-pay | Admitting: Family Medicine

## 2020-02-05 DIAGNOSIS — Z1231 Encounter for screening mammogram for malignant neoplasm of breast: Secondary | ICD-10-CM

## 2020-02-13 ENCOUNTER — Ambulatory Visit (INDEPENDENT_AMBULATORY_CARE_PROVIDER_SITE_OTHER): Payer: Medicaid Other

## 2020-02-13 ENCOUNTER — Other Ambulatory Visit: Payer: Self-pay

## 2020-02-13 DIAGNOSIS — J4550 Severe persistent asthma, uncomplicated: Secondary | ICD-10-CM

## 2020-02-13 MED ORDER — OMALIZUMAB 150 MG ~~LOC~~ SOLR
375.0000 mg | SUBCUTANEOUS | Status: DC
  Administered 2020-02-13 – 2020-11-24 (×20): 375 mg via SUBCUTANEOUS

## 2020-02-13 MED ORDER — EPINEPHRINE 0.3 MG/0.3ML IJ SOAJ: 0.3000 mg | Freq: Once | INTRAMUSCULAR | 1 refills | Status: AC

## 2020-02-13 NOTE — Progress Notes (Signed)
Immunotherapy   Patient Details  Name: Kendra Gilbert MRN: [id number] Date of Birth: 12-18-1955  02/13/2020  Kendra Gilbert started injections for Xolair 375 mg given in the office today. Patient signed paperwork and waited 30 mins in the office.  Frequency:every 14 days Epi-Pen:Prescription for Epi-Pen given Consent signed and patient instructions given.   Kendra Gilbert Kendra Gilbert 02/13/2020, 8:54 AM

## 2020-02-18 ENCOUNTER — Telehealth: Payer: Self-pay

## 2020-02-18 NOTE — Telephone Encounter (Signed)
Dr. Nelva Bush, no noted allergies to indicate issues with components.

## 2020-02-18 NOTE — Telephone Encounter (Signed)
Patient called stating she takes xolair and is considered to have low immunity. Patient is wondering should she do the 3rd Dose Covid Vaccine?  Please Advise.

## 2020-02-19 NOTE — Telephone Encounter (Signed)
Per my understanding of the criteria for the 3rd booster dose she would not qualify from an allergy/asthma standpoint.  Her inhaler medications and Xolair are not immunosuppressing agents and thus would not be considered immunocompromised.

## 2020-02-19 NOTE — Telephone Encounter (Signed)
Patient informed. 

## 2020-02-19 NOTE — Telephone Encounter (Signed)
Left message requesting  call back.

## 2020-02-27 ENCOUNTER — Ambulatory Visit (INDEPENDENT_AMBULATORY_CARE_PROVIDER_SITE_OTHER): Payer: Medicaid Other | Admitting: *Deleted

## 2020-02-27 ENCOUNTER — Other Ambulatory Visit: Payer: Self-pay

## 2020-02-27 DIAGNOSIS — J4550 Severe persistent asthma, uncomplicated: Secondary | ICD-10-CM | POA: Diagnosis not present

## 2020-03-13 ENCOUNTER — Other Ambulatory Visit: Payer: Self-pay

## 2020-03-13 ENCOUNTER — Ambulatory Visit (INDEPENDENT_AMBULATORY_CARE_PROVIDER_SITE_OTHER): Payer: Medicaid Other

## 2020-03-13 DIAGNOSIS — J4550 Severe persistent asthma, uncomplicated: Secondary | ICD-10-CM | POA: Diagnosis not present

## 2020-03-27 ENCOUNTER — Ambulatory Visit (INDEPENDENT_AMBULATORY_CARE_PROVIDER_SITE_OTHER): Payer: Medicaid Other

## 2020-03-27 ENCOUNTER — Other Ambulatory Visit: Payer: Self-pay

## 2020-03-27 DIAGNOSIS — J4550 Severe persistent asthma, uncomplicated: Secondary | ICD-10-CM

## 2020-03-31 ENCOUNTER — Other Ambulatory Visit: Payer: Self-pay

## 2020-03-31 ENCOUNTER — Other Ambulatory Visit: Payer: Self-pay | Admitting: Allergy

## 2020-03-31 ENCOUNTER — Ambulatory Visit
Admission: RE | Admit: 2020-03-31 | Discharge: 2020-03-31 | Disposition: A | Discharge status: Home / Self Care | Payer: Medicaid Other | Source: Ambulatory Visit | Attending: Family Medicine | Admitting: Family Medicine

## 2020-03-31 DIAGNOSIS — Z1231 Encounter for screening mammogram for malignant neoplasm of breast: Secondary | ICD-10-CM

## 2020-04-10 ENCOUNTER — Other Ambulatory Visit: Payer: Self-pay

## 2020-04-10 ENCOUNTER — Ambulatory Visit (INDEPENDENT_AMBULATORY_CARE_PROVIDER_SITE_OTHER): Payer: Medicaid Other

## 2020-04-10 DIAGNOSIS — J4550 Severe persistent asthma, uncomplicated: Secondary | ICD-10-CM

## 2020-04-16 ENCOUNTER — Encounter: Payer: Self-pay | Admitting: Allergy

## 2020-04-16 ENCOUNTER — Ambulatory Visit: Payer: Medicaid Other | Admitting: Allergy

## 2020-04-16 ENCOUNTER — Other Ambulatory Visit: Payer: Self-pay

## 2020-04-16 VITALS — BP 132/82 | HR 76 | Resp 20

## 2020-04-16 DIAGNOSIS — J3089 Other allergic rhinitis: Secondary | ICD-10-CM | POA: Diagnosis not present

## 2020-04-16 DIAGNOSIS — J4550 Severe persistent asthma, uncomplicated: Secondary | ICD-10-CM

## 2020-04-16 MED ORDER — FLOVENT HFA 110 MCG/ACT IN AERO: INHALATION_SPRAY | RESPIRATORY_TRACT | 5 refills | Status: DC

## 2020-04-16 NOTE — Progress Notes (Signed)
Follow-up Note  RE: Kendra Gilbert MRN: [id number] DOB: 11/21/1955 Date of Office Visit: 04/16/2020   History of present illness: Kendra Gilbert is a 64 y.o. female presenting today for follow-up of asthma, allergic rhinitis.  She was last seen in the office on 01/18/2020 by myself.   She did start Xolair after last visit at every 2 week injections.  She has noticed after the injection like in the afternoon (Xolair done in the mornings typically) then may have headache on one side of head and the next day will have nasal drainage.  She however only reports drinking about 4 oz of water a day as even water can interfere with her GERD.  She does report can do some jello and applesauce in moderation. With xolair she has used albuterol less than prior.  However she reports several weeks ago she did need to use her albuterol more as she states the weather had changed a bit and she also was out in public more.  She is taking symbicort twice a day and will use the flovent as needed to add on more control.   She continues to take xyzal daily and as needed nasal saline rinse.  Also uses nasacort 1 srpay daily to help with congestion.  The right nostril is usually more congested than left.  She has access to epipen with xolair use.    Review of systems: Review of Systems  Constitutional: Negative.   HENT: Positive for congestion.   Eyes: Negative.   Respiratory: Positive for cough, shortness of breath and wheezing.   Cardiovascular: Negative.   Gastrointestinal: Positive for heartburn.  Musculoskeletal: Positive for joint pain.  Skin: Negative.   Neurological: Positive for headaches.    All other systems negative unless noted above in HPI  Past medical/social/surgical/family history have been reviewed and are unchanged unless specifically indicated below.  No changes  Medication List: Current Outpatient Medications  Medication Sig Dispense Refill  . acetaminophen (TYLENOL) 325 MG  tablet Take 325 mg by mouth daily as needed for mild pain, fever or headache.    . albuterol (PROVENTIL HFA) 108 (90 Base) MCG/ACT inhaler Inhale 2 puffs into the lungs every 4 (four) hours as needed for wheezing or shortness of breath. 18 g 2  . azelastine (ASTELIN) 0.1 % nasal spray Use 2 sprays in each nostrils twice daily 30 mL 5  . budesonide-formoterol (SYMBICORT) 160-4.5 MCG/ACT inhaler INHALE 2 PUFFS BY MOUTH TWICE DAILY WITH SPACER 10.2 g 0  . diphenhydrAMINE (BENADRYL) 25 MG tablet Take 25 mg by mouth every 6 (six) hours as needed.    . fluticasone (FLOVENT HFA) 110 MCG/ACT inhaler Inhale two puffs twice daily to prevent cough or wheeze.  Rinse, gargle, and spit after use. 12 g 5  . ipratropium-albuterol (DUONEB) 0.5-2.5 (3) MG/3ML SOLN Use 1 vial via nebulizer every 4-6 hours as needed for cough, wheeze, shortness of breath or chest tightness 180 mL 1  . losartan (COZAAR) 25 MG tablet Take 1 tablet (25 mg total) by mouth daily. 30 tablet 11  . Olopatadine HCl (PATADAY OP) Apply to eye as needed.    . Sodium Chloride-Sodium Bicarb 1.57 g PACK Place into the nose.    Bethann Humble Sulfate (VISINE-AC OP) Apply 1 drop to eye in the morning, at noon, in the evening, and at bedtime.    . TRAMADOL HCL PO     . EPINEPHrine 0.3 mg/0.3 mL IJ SOAJ injection Inject into the muscle.    Marland Kitchen  levocetirizine (XYZAL) 5 MG tablet Xyzal 5 mg tablet  Take 1 tablet every day by oral route.    Arvid Right 150 MG injection Inject into the skin.     Current Facility-Administered Medications  Medication Dose Route Frequency Provider Last Rate Last Admin  . omalizumab Arvid Right) injection 375 mg  375 mg Subcutaneous Q14 Days Kennith Gain, MD   375 mg at 04/10/20 2330     Known medication allergies: Allergies  Allergen Reactions  . Amlodipine Shortness Of Breath and Swelling  . Bee Venom Anaphylaxis and Hives  . Lisinopril Itching and Cough    Dry cough and severe itching in the throat   .  Other Other (See Comments)    Mosquito bites cause hives all over body  . Aspirin Other (See Comments)    Causes stomach problems  . Erythromycin Other (See Comments)    Ate the lining of her stomach  . Hctz [Hydrochlorothiazide]     Rash   . Penicillins Rash    Has patient had a PCN reaction causing immediate rash, facial/tongue/throat swelling, SOB or lightheadedness with hypotension: Yes Has patient had a PCN reaction causing severe rash involving mucus membranes or skin necrosis: Yes Has patient had a PCN reaction that required hospitalization: No  Has patient had a PCN reaction occurring within the last 10 years: No If all of the above answers are "NO", then may proceed with Cephalosporin use.      Physical examination: Blood pressure 132/82, pulse 76, resp. rate 20, SpO2 96 %.  General: Alert, interactive, in no acute distress. HEENT: PERRLA, TMs pearly gray, turbinates mildly edematous without discharge R>L, post-pharynx non erythematous. Neck: Supple without lymphadenopathy. Lungs: Clear to auscultation without wheezing, rhonchi or rales. {no increased work of breathing. CV: Normal S1, S2 without murmurs. Abdomen: Nondistended, nontender. Skin: Warm and dry, without lesions or rashes. Extremities:  No clubbing, cyanosis or edema. Neuro:   Grossly intact.  Diagnositics/Labs:  Spirometry: FEV1: 1.22L 61%, FVC: 1.52L 58%, ratio consistent with restrictive pattern.  this is an improvement from prior study.   Assessment and plan:   Allergic rhinitis - continue avoidance measures for grass pollens, weed pollens, tree pollens, mold, dust mites - continue Xyzal 5mg  daily.   - continue performing nasal saline rinses as needed.  Ensure appropriate temperature of water before proceeding.  Breathe through your mouth while performing saline rinse.   - for nasal drainage continue use of nasal antihistamine, Astelin 2 sprays each nostril as needed at this time.  - for nasal  congestion/obstruction can use Nasacort 2 sprays each nostril daily for 1-2 weeks at a time before stopping once symptoms improve - for complete nasal obstruction/blockage can use over-the-counter Afrin 2 sprays and wait for 5-15 minutes to allow nasal passages to open.  Once you can breathe more freely then follow-up with the Nasacort.  Do not use Afrin more  Severe persistent asthma - have access to albuterol inhaler 2 puffs or albuterol 1 vial via nebulizer every 4-6 hours as needed for cough/wheeze/shortness of breath/chest tightness.  May use 15-20 minutes prior to activity.   Monitor frequency of use.    Use inhaler with spacer device.   - Symbicort 115mcg 2 puffs twice a day - Use Flovent 143mcg 2 puffs twice a day as needed if not meeting below goals or if having flare up.  This is a inhaled steroid only to provide more control  - continue Xolair injections every 2 weeks at this  time.  Believe the headache symptoms that she has noted after Xolair may be related to her hydration status which at this time is are relatively low.  Recommend increasing your hydration at least the day before and day of Xolair to see if this helps to reduce the risk of headache.   Follow-up in 3-4 months or sooner if needed   I appreciate the opportunity to take part in Kendra Gilbert's care. Please do not hesitate to contact me with questions.  Sincerely,   Prudy Feeler, MD Allergy/Immunology [facility] [place]

## 2020-04-16 NOTE — Patient Instructions (Addendum)
-   continue avoidance measures for grass pollens, weed pollens, tree pollens, mold, dust mites - continue Xyzal 5mg  daily.   - continue performing nasal saline rinses as needed.  Ensure appropriate temperature of water before proceeding.  Breathe through your mouth while performing saline rinse.   - for nasal drainage continue use of nasal antihistamine, Astelin 2 sprays each nostril as needed at this time.  - for nasal congestion/obstruction can use Nasacort 2 sprays each nostril daily for 1-2 weeks at a time before stopping once symptoms improve - for complete nasal obstruction/blockage can use over-the-counter Afrin 2 sprays and wait for 5-15 minutes to allow nasal passages to open.  Once you can breathe more freely then follow-up with the Nasacort.  Do not use Afrin more  - have access to albuterol inhaler 2 puffs or albuterol 1 vial via nebulizer every 4-6 hours as needed for cough/wheeze/shortness of breath/chest tightness.  May use 15-20 minutes prior to activity.   Monitor frequency of use.    Use inhaler with spacer device.   - Symbicort 112mcg 2 puffs twice a day - Use Flovent 129mcg 2 puffs twice a day as needed if not meeting below goals or if having flare up.  This is a inhaled steroid only to provide more control  - continue Xolair injections every 2 weeks at this time.  Recommend increasing your hydration at least the day before and day of Xolair.   Follow-up in 3-4 months or sooner if needed

## 2020-04-24 ENCOUNTER — Ambulatory Visit (INDEPENDENT_AMBULATORY_CARE_PROVIDER_SITE_OTHER): Payer: Medicaid Other | Admitting: *Deleted

## 2020-04-24 ENCOUNTER — Other Ambulatory Visit: Payer: Self-pay

## 2020-04-24 DIAGNOSIS — J4550 Severe persistent asthma, uncomplicated: Secondary | ICD-10-CM | POA: Diagnosis not present

## 2020-05-02 DIAGNOSIS — M25431 Effusion, right wrist: Secondary | ICD-10-CM | POA: Insufficient documentation

## 2020-05-08 ENCOUNTER — Other Ambulatory Visit: Payer: Self-pay

## 2020-05-08 ENCOUNTER — Ambulatory Visit (INDEPENDENT_AMBULATORY_CARE_PROVIDER_SITE_OTHER): Payer: Medicaid Other

## 2020-05-08 DIAGNOSIS — J4550 Severe persistent asthma, uncomplicated: Secondary | ICD-10-CM

## 2020-05-22 ENCOUNTER — Other Ambulatory Visit: Payer: Self-pay

## 2020-05-22 ENCOUNTER — Ambulatory Visit (INDEPENDENT_AMBULATORY_CARE_PROVIDER_SITE_OTHER): Payer: Medicaid Other | Admitting: *Deleted

## 2020-05-22 DIAGNOSIS — J4550 Severe persistent asthma, uncomplicated: Secondary | ICD-10-CM

## 2020-06-05 ENCOUNTER — Other Ambulatory Visit: Payer: Self-pay

## 2020-06-05 ENCOUNTER — Ambulatory Visit (INDEPENDENT_AMBULATORY_CARE_PROVIDER_SITE_OTHER): Payer: Medicaid Other | Admitting: *Deleted

## 2020-06-05 DIAGNOSIS — J4550 Severe persistent asthma, uncomplicated: Secondary | ICD-10-CM | POA: Diagnosis not present

## 2020-06-17 DIAGNOSIS — M654 Radial styloid tenosynovitis [de Quervain]: Secondary | ICD-10-CM | POA: Insufficient documentation

## 2020-06-19 ENCOUNTER — Other Ambulatory Visit: Payer: Self-pay

## 2020-06-19 ENCOUNTER — Ambulatory Visit (INDEPENDENT_AMBULATORY_CARE_PROVIDER_SITE_OTHER): Payer: Medicaid Other

## 2020-06-19 DIAGNOSIS — J4550 Severe persistent asthma, uncomplicated: Secondary | ICD-10-CM

## 2020-06-30 DIAGNOSIS — M6702 Short Achilles tendon (acquired), left ankle: Secondary | ICD-10-CM | POA: Insufficient documentation

## 2020-06-30 DIAGNOSIS — M76829 Posterior tibial tendinitis, unspecified leg: Secondary | ICD-10-CM | POA: Insufficient documentation

## 2020-06-30 DIAGNOSIS — M79672 Pain in left foot: Secondary | ICD-10-CM | POA: Insufficient documentation

## 2020-07-07 ENCOUNTER — Other Ambulatory Visit: Payer: Self-pay

## 2020-07-07 ENCOUNTER — Ambulatory Visit (INDEPENDENT_AMBULATORY_CARE_PROVIDER_SITE_OTHER): Payer: Medicaid Other | Admitting: *Deleted

## 2020-07-07 DIAGNOSIS — J4550 Severe persistent asthma, uncomplicated: Secondary | ICD-10-CM

## 2020-07-21 ENCOUNTER — Ambulatory Visit: Payer: Self-pay

## 2020-07-23 ENCOUNTER — Ambulatory Visit: Payer: Self-pay

## 2020-07-31 ENCOUNTER — Ambulatory Visit (INDEPENDENT_AMBULATORY_CARE_PROVIDER_SITE_OTHER): Payer: Medicaid Other | Admitting: *Deleted

## 2020-07-31 ENCOUNTER — Other Ambulatory Visit: Payer: Self-pay

## 2020-07-31 ENCOUNTER — Encounter: Payer: Self-pay | Admitting: Allergy

## 2020-07-31 ENCOUNTER — Ambulatory Visit: Payer: Medicaid Other | Admitting: Allergy

## 2020-07-31 VITALS — BP 118/80 | HR 80 | Temp 95.9°F | Resp 18 | Ht <= 58 in | Wt 187.9 lb

## 2020-07-31 DIAGNOSIS — H1012 Acute atopic conjunctivitis, left eye: Secondary | ICD-10-CM

## 2020-07-31 DIAGNOSIS — J4550 Severe persistent asthma, uncomplicated: Secondary | ICD-10-CM

## 2020-07-31 DIAGNOSIS — L299 Pruritus, unspecified: Secondary | ICD-10-CM | POA: Diagnosis not present

## 2020-07-31 DIAGNOSIS — J3089 Other allergic rhinitis: Secondary | ICD-10-CM

## 2020-07-31 MED ORDER — BEPOTASTINE BESILATE 1.5 % OP SOLN
1.0000 [drp] | Freq: Two times a day (BID) | OPHTHALMIC | 5 refills | Status: DC | PRN
Start: 2020-07-31 — End: 2021-11-13

## 2020-07-31 MED ORDER — ALBUTEROL SULFATE HFA 108 (90 BASE) MCG/ACT IN AERS: 2.0000 | INHALATION_SPRAY | RESPIRATORY_TRACT | 2 refills | Status: DC | PRN

## 2020-07-31 NOTE — Patient Instructions (Addendum)
-   continue avoidance measures for grass pollens, weed pollens, tree pollens, mold, dust mites - continue Xyzal 5mg  daily.   - continue performing nasal saline rinses as needed.  Ensure appropriate temperature of water before proceeding.  Breathe through your mouth while performing saline rinse.   - for nasal drainage continue use of nasal antihistamine, Astelin 2 sprays each nostril as needed at this time.  - for nasal congestion/obstruction can use Nasacort 2 sprays each nostril daily for 1-2 weeks at a time before stopping once symptoms improve - will change Bepreve 1 drop each eye twice a day as needed for itchy, watery eyes - for itchy skin try Epiceram moisturizer which is a prescription based moisturizer that helps in itch relief and improves hydration in the skin.  Can use twice a day.  Sample tubes provided  - have access to albuterol inhaler 2 puffs or albuterol 1 vial via nebulizer every 4-6 hours as needed for cough/wheeze/shortness of breath/chest tightness.  May use 15-20 minutes prior to activity.   Monitor frequency of use.    Use inhaler with spacer device.   - Symbicort 149mcg 2 puffs twice a day - Use Flovent 180mcg 2 puffs twice a day as needed if not meeting below goals or if having flare up.  This is a inhaled steroid only to provide more control  - continue Xolair injections every 2 weeks at this time.  Recommend increasing your hydration at least the day before and day of Xolair.   Follow-up in 4 months or sooner if needed

## 2020-07-31 NOTE — Progress Notes (Signed)
Follow-up Note  RE: Cassiopeia Florentino MRN: [id number] DOB: 09-11-1955 Date of Office Visit: 07/31/2020   History of present illness: Kendra Gilbert is a 65 y.o. female presenting today for follow-up of severe persistent asthma, allergic rhinitis. She was last seen in the office on 04/16/20 by myself.  She states she tends to do a bit better during the colder months.  She does feel this allergic season has been the same in regards to asthma improvement in her respiratory control.  However she does report some coughing wheezing some loose phlegm.  She did need to use her nebulizer especially at around the times where it was much colder however he had some snow.  She however is quite pleased that she has not needed to go to the emergency department which is something she has had to do on multiple occasions in previous years.  She continues to use Symbicort 160 mcg 2 puffs twice a day and does have access to Flovent to add on to her therapy if we need more control like during a flare.  She does continue on Xolair injections every 2 weeks.  She states that the head pain she was having previously has lessened. Is a new concern and that she is having more body itching as well as itching of the left eye left side of her head.  The itching of the left eye and head is more of a constant and the body itching fluctuates day to day.  She states she does moisturize daily.  She states that the Pataday does not seem to help the eye itch nor does using Visine.  She does continue to take Xyzal.  She has access to Astelin for as needed for nasal drainage control as well as Nasacort for as needed use for nasal congestion control.  Review of systems: Review of Systems  Constitutional: Negative.   HENT: Positive for congestion.   Eyes: Negative.   Respiratory: Positive for cough, shortness of breath and wheezing. Negative for hemoptysis and sputum production.   Cardiovascular: Negative.   Gastrointestinal:  Negative.   Musculoskeletal: Positive for myalgias.  Skin: Positive for itching. Negative for rash.  Neurological: Negative.     All other systems negative unless noted above in HPI  Past medical/social/surgical/family history have been reviewed and are unchanged unless specifically indicated below.  No changes  Medication List: Current Outpatient Medications  Medication Sig Dispense Refill  . acetaminophen (TYLENOL) 325 MG tablet Take 325 mg by mouth daily as needed for mild pain, fever or headache.    Marland Kitchen azelastine (ASTELIN) 0.1 % nasal spray Use 2 sprays in each nostrils twice daily 30 mL 5  . Bepotastine Besilate (BEPREVE) 1.5 % SOLN Place 1 drop into both eyes 2 (two) times daily as needed. 10 mL 5  . budesonide-formoterol (SYMBICORT) 160-4.5 MCG/ACT inhaler INHALE 2 PUFFS BY MOUTH TWICE DAILY WITH SPACER 10.2 g 0  . diphenhydrAMINE (BENADRYL) 25 MG tablet Take 25 mg by mouth every 6 (six) hours as needed.    Marland Kitchen EPINEPHrine 0.3 mg/0.3 mL IJ SOAJ injection Inject into the muscle.    . fluticasone (FLOVENT HFA) 110 MCG/ACT inhaler Inhale two puffs twice daily if needed to prevent cough or wheeze.  Rinse, gargle, and spit after use. 12 g 5  . ipratropium-albuterol (DUONEB) 0.5-2.5 (3) MG/3ML SOLN Use 1 vial via nebulizer every 4-6 hours as needed for cough, wheeze, shortness of breath or chest tightness 180 mL 1  . levocetirizine (XYZAL) 5 MG  tablet Xyzal 5 mg tablet  Take 1 tablet every day by oral route.    Marland Kitchen losartan (COZAAR) 25 MG tablet Take 1 tablet (25 mg total) by mouth daily. 30 tablet 11  . naphazoline-pheniramine (VISINE) 0.025-0.3 % ophthalmic solution 1 drop into affected eye  as needed    . pantoprazole (PROTONIX) 40 MG tablet 1 tablet    . Sodium Chloride-Sodium Bicarb 1.57 g PACK Place into the nose.    Bethann Humble Sulfate (VISINE-AC OP) Apply 1 drop to eye in the morning, at noon, in the evening, and at bedtime.    . TRAMADOL HCL PO     . XOLAIR 150 MG  injection Inject into the skin.    Marland Kitchen albuterol (PROVENTIL HFA) 108 (90 Base) MCG/ACT inhaler Inhale 2 puffs into the lungs every 4 (four) hours as needed for wheezing or shortness of breath. 18 g 2   Current Facility-Administered Medications  Medication Dose Route Frequency Provider Last Rate Last Admin  . omalizumab Arvid Right) injection 375 mg  375 mg Subcutaneous Q14 Days Kennith Gain, MD   375 mg at 07/31/20 1142     Known medication allergies: Allergies  Allergen Reactions  . Amlodipine Shortness Of Breath and Swelling  . Bee Venom Anaphylaxis and Hives  . Lisinopril Itching and Cough    Dry cough and severe itching in the throat   . Other Other (See Comments)    Mosquito bites cause hives all over body  . Aspirin Other (See Comments)    Causes stomach problems  . Erythromycin Other (See Comments)    Ate the lining of her stomach  . Hctz [Hydrochlorothiazide]     Rash   . Penicillins Rash    Has patient had a PCN reaction causing immediate rash, facial/tongue/throat swelling, SOB or lightheadedness with hypotension: Yes Has patient had a PCN reaction causing severe rash involving mucus membranes or skin necrosis: Yes Has patient had a PCN reaction that required hospitalization: No  Has patient had a PCN reaction occurring within the last 10 years: No If all of the above answers are "NO", then may proceed with Cephalosporin use.      Physical examination: Blood pressure 118/80, pulse 80, temperature (!) 95.9 F (35.5 C), temperature source Temporal, resp. rate 18, height 4' 9.48" (1.46 m), weight 187 lb 14.4 oz (85.2 kg), SpO2 97 %.  General: Alert, interactive, in no acute distress. HEENT: PERRLA, TMs pearly gray, turbinates non-edematous without discharge, post-pharynx non erythematous. Neck: Supple without lymphadenopathy. Lungs: Clear to auscultation without wheezing, rhonchi or rales. {no increased work of breathing. CV: Normal S1, S2 without  murmurs. Abdomen: Nondistended, nontender. Skin: Warm and dry, without lesions or rashes. Extremities:  No clubbing, cyanosis or edema. Neuro:   Grossly intact.  Diagnositics/Labs:  Spirometry: FEV1: 1.19 L 65%, FVC: 1.39 L 58% predicted.  This is a improvement from her previous study showing a 65% FEV1 predicted   Assessment and plan: Severe persistent asthma  Allergic rhinitis with conjunctivitis Pruritus  - continue avoidance measures for grass pollens, weed pollens, tree pollens, mold, dust mites - continue Xyzal 5mg  daily.   - continue performing nasal saline rinses as needed.  Ensure appropriate temperature of water before proceeding.  Breathe through your mouth while performing saline rinse.   - for nasal drainage continue use of nasal antihistamine, Astelin 2 sprays each nostril as needed at this time.  - for nasal congestion/obstruction can use Nasacort 2 sprays each nostril daily for 1-2 weeks  at a time before stopping once symptoms improve - will change Bepreve 1 drop each eye twice a day as needed for itchy, watery eyes - for itchy skin try Epiceram moisturizer which is a prescription based moisturizer that helps in itch relief and improves hydration in the skin.  Can use twice a day.  Sample tubes provided  - have access to albuterol inhaler 2 puffs or albuterol 1 vial via nebulizer every 4-6 hours as needed for cough/wheeze/shortness of breath/chest tightness.  May use 15-20 minutes prior to activity.   Monitor frequency of use.    Use inhaler with spacer device.   - Symbicort 137mcg 2 puffs twice a day - Use Flovent 155mcg 2 puffs twice a day as needed if not meeting below goals or if having flare up.  This is a inhaled steroid only to provide more control  - continue Xolair injections every 2 weeks at this time.  Recommend increasing your hydration at least the day before and day of Xolair.   Follow-up in 4 months or sooner if needed  I appreciate the opportunity to take  part in Onna's care. Please do not hesitate to contact me with questions.  Sincerely,   Prudy Feeler, MD Allergy/Immunology [facility]

## 2020-08-03 ENCOUNTER — Other Ambulatory Visit: Payer: Self-pay | Admitting: Allergy

## 2020-08-04 ENCOUNTER — Telehealth: Payer: Self-pay

## 2020-08-04 NOTE — Telephone Encounter (Signed)
Prior auth for bepinastine eyedrops submitted on covermymeds.

## 2020-08-04 NOTE — Telephone Encounter (Signed)
Approved and sent to pharmacy

## 2020-08-15 ENCOUNTER — Ambulatory Visit (INDEPENDENT_AMBULATORY_CARE_PROVIDER_SITE_OTHER): Payer: Medicaid Other | Admitting: *Deleted

## 2020-08-15 DIAGNOSIS — J4550 Severe persistent asthma, uncomplicated: Secondary | ICD-10-CM | POA: Diagnosis not present

## 2020-08-17 NOTE — Progress Notes (Unsigned)
Cardiology Office Note   Date:  08/18/2020   ID:  Kendra Gilbert, Kendra Gilbert 1955-12-15, MRN [id number]  PCP:  Leighton Ruff, MD  Cardiologist:   Minus Breeding, MD   No chief complaint on file.     History of Present Illness: Kendra Gilbert is a 65 y.o. female who presents for follow up of HTN.  She was intolerant of lisinopril which caused some cough.  We switched her to Cozaar.  She presents for follow-up of this.  She also has nonobstructive coronary disease.  In 2020 she had a CT coronary angiogram that demonstrated some left main calcification.  The LAD had proximal 50 to 69% stenosis.  She was managed medically.  She is getting more shortness of breath.  She has been getting dyspneic doing activities such as walking up a slight incline.  She might have to stop to catch her breath.  She has occasional chest discomfort that is not associated with this.  She is not really describing classic substernal chest pressure, neck or arm discomfort.  She is not had any new palpitations, presyncope or syncope.  She had mild cough and wheezing.  She has had lots of problems with tendinitis in her wrists and predominantly in her feet.  She is getting physical therapy right now.  She is wearing a brace on her left foot.  She has had swelling predominantly of her left leg no significant calf tenderness.  I do note that she had normal ABIs last year.   Past Medical History:  Diagnosis Date  . Asthma   . Chronic kidney disease    cyst in left kidney  . Colon cancer ([place]) 2017   surgical tx  removal of polyp  . Coronary artery calcification    a. POET in 2015 with no acute findings.  . Diarrhea since 02-16-17   c dif test normal per pt  . Dyspnea     with exertion, occ at rest no oxygen or inhaler use  . GERD (gastroesophageal reflux disease)   . H/O: rheumatic fever as child  . Headache    occ left side migraine  . Hypertension   . Lung disease    Lung nodule  . 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 Pneumonia 2016  . Rheumatoid arthritis ([place])    oa also, back arthritis, hx rheumatoid     Past Surgical History:  Procedure Laterality Date  . ABDOMINAL HYSTERECTOMY     ovaries left  . COLONOSCOPY WITH PROPOFOL N/A 03/23/2017   Procedure: COLONOSCOPY WITH PROPOFOL;  Surgeon: Arta Silence, MD;  Location: [facility];  Service: Endoscopy;  Laterality: N/A;  . colonscopy  2017  . DILATION AND CURETTAGE OF UTERUS     few done for endometriosis     Current Outpatient Medications  Medication Sig Dispense Refill  . acetaminophen (TYLENOL) 325 MG tablet Take 325 mg by mouth daily as needed for mild pain, fever or headache.    . albuterol (PROVENTIL HFA) 108 (90 Base) MCG/ACT inhaler Inhale 2 puffs into the lungs every 4 (four) hours as needed for wheezing or shortness of breath. 18 g 2  . azelastine (ASTELIN) 0.1 % nasal spray Use 2 sprays in each nostrils twice daily 30 mL 5  . Bepotastine Besilate (BEPREVE) 1.5 % SOLN Place 1 drop into both eyes 2 (two) times daily as needed. 10 mL 5  . budesonide-formoterol (SYMBICORT) 160-4.5 MCG/ACT inhaler INHALE 2 PUFFS INTO THE LUNGS  TWICE DAILY 10.2 g 5  . diphenhydrAMINE (BENADRYL) 25 MG tablet Take 25 mg by mouth every 6 (six) hours as needed.    Marland Kitchen EPINEPHrine 0.3 mg/0.3 mL IJ SOAJ injection Inject into the muscle.    . fluticasone (FLOVENT HFA) 110 MCG/ACT inhaler Inhale two puffs twice daily if needed to prevent cough or wheeze.  Rinse, gargle, and spit after use. 12 g 5  . ipratropium-albuterol (DUONEB) 0.5-2.5 (3) MG/3ML SOLN Use 1 vial via nebulizer every 4-6 hours as needed for cough, wheeze, shortness of breath or chest tightness 180 mL 1  . levocetirizine (XYZAL) 5 MG tablet Xyzal 5 mg tablet  Take 1 tablet every day by oral route.    Marland Kitchen losartan (COZAAR) 25 MG tablet Take 1 tablet (25 mg total) by mouth daily. 30 tablet 11  . pantoprazole (PROTONIX) 40 MG tablet 1 tablet    . Sodium  Chloride-Sodium Bicarb 1.57 g PACK Place into the nose.    Bethann Humble Sulfate (VISINE-AC OP) Apply 1 drop to eye in the morning, at noon, in the evening, and at bedtime.    . TRAMADOL HCL PO     . XOLAIR 150 MG injection Inject into the skin.     Current Facility-Administered Medications  Medication Dose Route Frequency Provider Last Rate Last Admin  . omalizumab Arvid Right) injection 375 mg  375 mg Subcutaneous Q14 Days Kennith Gain, MD   375 mg at 08/15/20 1448    Allergies:   Amlodipine, Bee venom, Lisinopril, Other, Aspirin, Erythromycin, Hctz [hydrochlorothiazide], and Penicillins    ROS:  Please see the history of present illness.   Otherwise, review of systems are positive for none.   All other systems are reviewed and negative.    PHYSICAL EXAM: VS:  BP 134/88   Pulse 68   Ht 4\' 11"  (1.499 m)   Wt 186 lb (84.4 kg)   SpO2 95%   BMI 37.57 kg/m  , BMI Body mass index is 37.57 kg/m. GENERAL:  Well appearing NECK:  No jugular venous distention, waveform within normal limits, carotid upstroke brisk and symmetric, no bruits, no thyromegaly LUNGS:  Clear to auscultation bilaterally CHEST:  Unremarkable HEART:  PMI not displaced or sustained,S1 and S2 within normal limits, no S3, no S4, no clicks, no rubs, no murmurs ABD:  Flat, positive bowel sounds normal in frequency in pitch, no bruits, no rebound, no guarding, no midline pulsatile mass, no hepatomegaly, no splenomegaly EXT:  2 plus pulses throughout, left leg greater than right leg mild edema, no cyanosis no clubbing   EKG:  EKG is  ordered today. The ekg ordered today demonstrates sinus rhythm, rate 68, axis within normal limits, intervals within normal limits, no acute ST-T wave changes.   Recent Labs: 10/17/2019: Hemoglobin 13.7    Lipid Panel    Component Value Date/Time   CHOL 228 (H) 02/06/2016 0930   TRIG 93 02/06/2016 0930   HDL 69 02/06/2016 0930   CHOLHDL 3.3 02/06/2016 0930   VLDL 19  02/06/2016 0930   LDLCALC 140 (H) 02/06/2016 0930   LDLDIRECT 135.2 08/23/2013 1142      Wt Readings from Last 3 Encounters:  08/18/20 186 lb (84.4 kg)  07/31/20 187 lb 14.4 oz (85.2 kg)  01/18/20 187 lb 12.8 oz (85.2 kg)      Other studies Reviewed: Additional studies/ records that were reviewed today include: CT, arterial Dopplers. Review of the above records demonstrates:  Please see elsewhere in the note.  ASSESSMENT AND PLAN:  SOB:   The etiology of this could be pulmonary as she has a history of asthma and allergies.  However, she also has nonobstructive coronary disease.  This needs to be excluded as the etiology.  She would not be able to walk on a treadmill so I will order Myoview.  I will also check labs to include a CBC, TSH.  I will check a BNP level.  She will also get electrolytes.  Leg swelling -I will order a venous Doppler to rule out DVT.  HTN -   The blood pressure is slightly above what I would like it but she says it is well controlled at home.  No change in therapy.   Obesity-     We have talked about this quite a bit.    Current medicines are reviewed at length with the patient today.  The patient does not have concerns regarding medicines.  The following changes have been made:  None  Labs/ tests ordered today include:   Orders Placed This Encounter  Procedures  . Basic metabolic panel  . Brain natriuretic peptide  . CBC  . TSH  . MYOCARDIAL PERFUSION IMAGING  . EKG 12-Lead  . VAS [place] LOWER EXTREMITY VENOUS (DVT)     Disposition:   FU with me in 2 months or sooner if needed.   Signed, Minus Breeding, MD  08/18/2020 10:36 AM    [facility]

## 2020-08-18 ENCOUNTER — Encounter: Payer: Self-pay | Admitting: Cardiology

## 2020-08-18 ENCOUNTER — Other Ambulatory Visit (HOSPITAL_COMMUNITY): Payer: Self-pay | Admitting: Internal Medicine

## 2020-08-18 ENCOUNTER — Ambulatory Visit (HOSPITAL_COMMUNITY)
Admission: RE | Admit: 2020-08-18 | Discharge: 2020-08-18 | Disposition: A | Discharge status: Home / Self Care | Payer: Medicaid Other | Source: Ambulatory Visit | Attending: Cardiology | Admitting: Cardiology

## 2020-08-18 ENCOUNTER — Ambulatory Visit (INDEPENDENT_AMBULATORY_CARE_PROVIDER_SITE_OTHER): Payer: Medicaid Other | Admitting: Cardiology

## 2020-08-18 ENCOUNTER — Other Ambulatory Visit: Payer: Self-pay

## 2020-08-18 VITALS — BP 134/88 | HR 68 | Ht 59.0 in | Wt 186.0 lb

## 2020-08-18 DIAGNOSIS — I10 Essential (primary) hypertension: Secondary | ICD-10-CM

## 2020-08-18 DIAGNOSIS — R0602 Shortness of breath: Secondary | ICD-10-CM

## 2020-08-18 DIAGNOSIS — M7989 Other specified soft tissue disorders: Secondary | ICD-10-CM

## 2020-08-18 DIAGNOSIS — M79605 Pain in left leg: Secondary | ICD-10-CM

## 2020-08-18 NOTE — Patient Instructions (Addendum)
Medication Instructions:  No changes *If you need a refill on your cardiac medications before your next appointment, please call your pharmacy*  Lab Work: Your physician recommends that you return for lab work today: (BMP, BNP, CBC, TSH) If you have labs (blood work) drawn today and your tests are completely normal, you will receive your results only by: Marland Kitchen MyChart Message (if you have MyChart) OR . A paper copy in the mail If you have any lab test that is abnormal or we need to change your treatment, we will call you to review the results.  Testing/Procedures: Your physician has requested that you have a lexiscan myoview. For further information please visit [email or web address]. Please follow instruction sheet, as given.  Left Venous Doppler at 11am  Follow-Up: At [facility], you and your health needs are our priority.  As part of our continuing mission to provide you with exceptional heart care, we have created designated Provider Care Teams.  These Care Teams include your primary Cardiologist (physician) and Advanced Practice Providers (APPs -  Physician Assistants and Nurse Practitioners) who all work together to provide you with the care you need, when you need it.  Your next appointment:   Thursday 4/28 at 09:20am  The format for your next appointment:   In Person  Provider:   Minus Breeding, MD  Other Instructions: My doctor scheduled me for an MPI Carlton Adam) test. What is it? MPI stands for myocardial perfusion imaging. The "myocardium" is your heart muscle. "Perfusion" refers to blood flow. And "imaging" is exactly what it sounds like, taking pictures. So, myocardial perfusion imaging is basically taking pictures of the blood flow to your heart. MPI is also called a cardiac nuclear stress test. It is a commonly used test that provides detailed images that can be used to diagnose and assess coronary artery disease.  Does it hurt? Except for a small needle  (catheter) that will be placed in a vein (IV) in your arm at the start of the test, MPI is a noninvasive test. That means it takes place outside of your body and does not involve surgery of any kind. You will stay awake and alert the entire time. A small amount of radioactive liquid (REGADENOSON), called a tracer, will be injected into your bloodstream through the catheter during the MPI test. This tracer helps the doctor see the blood flow to your heart. You probably will not feel any effects from the tracer, which your body eliminates by natural means.  Which foods, drinks, and medications should I avoid before my test? DO NOT consume caffeine-containing foods and drinks or medications that contain methylxanthines (eg, caffeine, aminophylline, or theophylline) in the 12 hours before your scheduled stress test in the event that pharmacologic stress is used. In addition, avoid any prescription medications containing dipyridamole in the 48 hours before your stress test.  TABLE 1: FOODS TO AVOID chocolate candies chocolate cakes brownies chocolate pudding energy bars foods containing guarana  TABLE 2: DRINKS TO AVOID chocolate milk hot cocoa coffee (brewed, instant, iced, decaf) tea (brewed, instant, iced, decaf) soda pop (including "caffeine-free") energy drinks drinks containing guarana

## 2020-08-19 ENCOUNTER — Telehealth: Payer: Self-pay | Admitting: Cardiology

## 2020-08-19 LAB — BASIC METABOLIC PANEL
BUN/Creatinine Ratio: 12 (ref 12–28)
BUN: 9 mg/dL (ref 8–27)
CO2: 21 mmol/L (ref 20–29)
Calcium: 9.5 mg/dL (ref 8.7–10.3)
Chloride: 105 mmol/L (ref 96–106)
Creatinine, Ser: 0.74 mg/dL (ref 0.57–1.00)
GFR calc Af Amer: 99 mL/min/{1.73_m2} (ref >59)
GFR calc non Af Amer: 86 mL/min/{1.73_m2} (ref >59)
Glucose: 77 mg/dL (ref 65–99)
Potassium: 3.8 mmol/L (ref 3.5–5.2)
Sodium: 145 mmol/L — ABNORMAL HIGH (ref 134–144)

## 2020-08-19 LAB — CBC
Hematocrit: 39.1 % (ref 34.0–46.6)
Hemoglobin: 12.8 g/dL (ref 11.1–15.9)
MCH: 27.8 pg (ref 26.6–33.0)
MCHC: 32.7 g/dL (ref 31.5–35.7)
MCV: 85 fL (ref 79–97)
Platelets: 171 10*3/uL (ref 150–450)
RBC: 4.6 x10E6/uL (ref 3.77–5.28)
RDW: 13.3 % (ref 11.7–15.4)
WBC: 7 10*3/uL (ref 3.4–10.8)

## 2020-08-19 LAB — TSH: TSH: 1.96 u[IU]/mL (ref 0.450–4.500)

## 2020-08-19 LAB — BRAIN NATRIURETIC PEPTIDE: BNP: 88.5 pg/mL (ref 0.0–100.0)

## 2020-08-19 NOTE — Telephone Encounter (Signed)
Spoke with patient who reports the MD's name listed below is on her MyChart account. She is not familiar with the MD and upon Google search, he is a cardiologist from [facility]. She states her PCP is Marda Stalker PA (updated in chart). Advised that she contact MyChart assistance to see if this provider can be removed. He is not showing up in her care teams list per my Epic view

## 2020-08-19 NOTE — Telephone Encounter (Signed)
Kendra Gilbert is calling stating there is a physician listed on her MyChart with our address that she is not familiar with. She states the name is Merri Ray, MD. She states she just wanted to clarify who this was and what it was in regards to to avoid any confusion with billing. Please advise.

## 2020-08-21 ENCOUNTER — Telehealth (HOSPITAL_COMMUNITY): Payer: Self-pay | Admitting: *Deleted

## 2020-08-21 NOTE — Telephone Encounter (Signed)
Close encounter 

## 2020-08-26 ENCOUNTER — Other Ambulatory Visit: Payer: Self-pay

## 2020-08-26 ENCOUNTER — Ambulatory Visit (HOSPITAL_COMMUNITY)
Admission: RE | Admit: 2020-08-26 | Discharge: 2020-08-26 | Disposition: A | Discharge status: Home / Self Care | Payer: Medicaid Other | Source: Ambulatory Visit | Attending: Cardiovascular Disease | Admitting: Cardiovascular Disease

## 2020-08-26 DIAGNOSIS — R0602 Shortness of breath: Secondary | ICD-10-CM | POA: Insufficient documentation

## 2020-08-26 LAB — MYOCARDIAL PERFUSION IMAGING
LV dias vol: 63 mL (ref 46–106)
LV sys vol: 21 mL
Peak HR: 102 {beats}/min
Rest HR: 70 {beats}/min
SDS: 21
SRS: 0
SSS: 0
TID: 1.22

## 2020-08-26 MED ORDER — TECHNETIUM TC 99M TETROFOSMIN IV KIT
29.4000 | PACK | Freq: Once | INTRAVENOUS | Status: AC | PRN
  Administered 2020-08-26: 29.4 via INTRAVENOUS
  Filled 2020-08-26: qty 30

## 2020-08-26 MED ORDER — TECHNETIUM TC 99M TETROFOSMIN IV KIT
9.6000 | PACK | Freq: Once | INTRAVENOUS | Status: AC | PRN
  Administered 2020-08-26: 9.6 via INTRAVENOUS
  Filled 2020-08-26: qty 10

## 2020-08-26 MED ORDER — REGADENOSON 0.4 MG/5ML IV SOLN
0.4000 mg | Freq: Once | INTRAVENOUS | Status: AC
  Administered 2020-08-26: 0.4 mg via INTRAVENOUS

## 2020-08-27 ENCOUNTER — Ambulatory Visit (INDEPENDENT_AMBULATORY_CARE_PROVIDER_SITE_OTHER): Payer: Medicaid Other

## 2020-08-27 DIAGNOSIS — J4540 Moderate persistent asthma, uncomplicated: Secondary | ICD-10-CM | POA: Diagnosis not present

## 2020-08-28 ENCOUNTER — Ambulatory Visit: Payer: Self-pay

## 2020-08-31 ENCOUNTER — Other Ambulatory Visit: Payer: Self-pay | Admitting: Cardiology

## 2020-09-10 ENCOUNTER — Ambulatory Visit (INDEPENDENT_AMBULATORY_CARE_PROVIDER_SITE_OTHER): Payer: Medicaid Other

## 2020-09-10 DIAGNOSIS — J4550 Severe persistent asthma, uncomplicated: Secondary | ICD-10-CM

## 2020-09-26 ENCOUNTER — Ambulatory Visit (INDEPENDENT_AMBULATORY_CARE_PROVIDER_SITE_OTHER): Payer: Medicaid Other | Admitting: *Deleted

## 2020-09-26 DIAGNOSIS — J4550 Severe persistent asthma, uncomplicated: Secondary | ICD-10-CM | POA: Diagnosis not present

## 2020-10-10 ENCOUNTER — Other Ambulatory Visit: Payer: Self-pay

## 2020-10-10 ENCOUNTER — Ambulatory Visit (INDEPENDENT_AMBULATORY_CARE_PROVIDER_SITE_OTHER): Payer: Medicaid Other

## 2020-10-10 DIAGNOSIS — J4550 Severe persistent asthma, uncomplicated: Secondary | ICD-10-CM | POA: Diagnosis not present

## 2020-10-24 ENCOUNTER — Other Ambulatory Visit: Payer: Self-pay

## 2020-10-24 ENCOUNTER — Ambulatory Visit (INDEPENDENT_AMBULATORY_CARE_PROVIDER_SITE_OTHER): Payer: Medicaid Other

## 2020-10-24 DIAGNOSIS — J4550 Severe persistent asthma, uncomplicated: Secondary | ICD-10-CM | POA: Diagnosis not present

## 2020-10-29 NOTE — Progress Notes (Signed)
Cardiology Office Note   Date:  10/30/2020   ID:  Kendra, Gilbert 05/30/1956, MRN [id number]  PCP:  Marda Stalker, PA-C  Cardiologist:   Minus Breeding, MD   Chief Complaint  Patient presents with  . Shortness of Breath      History of Present Illness: Kendra Gilbert is a 65 y.o. female who presents for follow up of HTN.  She was intolerant of lisinopril which caused some cough.  We switched her to Cozaar.   She also has nonobstructive coronary disease.  In 2020 she had a CT coronary angiogram that demonstrated some left main calcification.  The LAD had proximal 50 to 69% stenosis.  She was having more shortness of breath I saw her recently. Her Lexiscan Myoview was negative in Feb.    Since I last saw her she has had lots of problems with joints.  She is getting this soon.  She has problems with asthma as an allergy.  She is seeing allergist.  She still walks 3 to 4 days a week 1-1/2 hours by her report.  She is not having any new chest pressure, neck or arm discomfort.  Her breathing is related more to the pollen in the heat.  She is not having any resting shortness of breath, PND or orthopnea.  She had no new chest pressure, neck or arm discomfort.  She has some chronic mild lower extremity swelling.  Past Medical History:  Diagnosis Date  . Asthma   . Chronic kidney disease    cyst in left kidney  . Colon cancer ([place]) 2017   surgical tx  removal of polyp  . Coronary artery calcification    a. POET in 2015 with no acute findings.  . Diarrhea since 02-16-17   c dif test normal per pt  . Dyspnea     with exertion, occ at rest no oxygen or inhaler use  . GERD (gastroesophageal reflux disease)   . H/O: rheumatic fever as child  . Headache    occ left side migraine  . Hypertension   . Lung disease    Lung nodule  . Non-alcoholic cirrhosis ([place]) 6712   no current issues with  . Pneumonia 2016  . Rheumatoid arthritis ([place])    oa also, back arthritis, hx  rheumatoid     Past Surgical History:  Procedure Laterality Date  . ABDOMINAL HYSTERECTOMY     ovaries left  . COLONOSCOPY WITH PROPOFOL N/A 03/23/2017   Procedure: COLONOSCOPY WITH PROPOFOL;  Surgeon: Arta Silence, MD;  Location: [facility];  Service: Endoscopy;  Laterality: N/A;  . colonscopy  2017  . DILATION AND CURETTAGE OF UTERUS     few done for endometriosis     Current Outpatient Medications  Medication Sig Dispense Refill  . acetaminophen (TYLENOL) 325 MG tablet Take 325 mg by mouth daily as needed for mild pain, fever or headache.    . albuterol (PROVENTIL HFA) 108 (90 Base) MCG/ACT inhaler Inhale 2 puffs into the lungs every 4 (four) hours as needed for wheezing or shortness of breath. 18 g 2  . azelastine (ASTELIN) 0.1 % nasal spray Use 2 sprays in each nostrils twice daily 30 mL 5  . Bepotastine Besilate (BEPREVE) 1.5 % SOLN Place 1 drop into both eyes 2 (two) times daily as needed. 10 mL 5  . budesonide-formoterol (SYMBICORT) 160-4.5 MCG/ACT inhaler INHALE 2 PUFFS INTO THE LUNGS TWICE DAILY 10.2 g 5  . diphenhydrAMINE (BENADRYL) 25 MG tablet Take 25  mg by mouth every 6 (six) hours as needed.    Marland Kitchen EPINEPHrine 0.3 mg/0.3 mL IJ SOAJ injection Inject into the muscle.    . fluticasone (FLOVENT HFA) 110 MCG/ACT inhaler Inhale two puffs twice daily if needed to prevent cough or wheeze.  Rinse, gargle, and spit after use. 12 g 5  . ipratropium-albuterol (DUONEB) 0.5-2.5 (3) MG/3ML SOLN Use 1 vial via nebulizer every 4-6 hours as needed for cough, wheeze, shortness of breath or chest tightness 180 mL 1  . levocetirizine (XYZAL) 5 MG tablet Xyzal 5 mg tablet  Take 1 tablet every day by oral route.    . pantoprazole (PROTONIX) 40 MG tablet 1 tablet    . pravastatin (PRAVACHOL) 40 MG tablet Take 1 tablet (40 mg total) by mouth at bedtime. 90 tablet 3  . Sodium Chloride-Sodium Bicarb 1.57 g PACK Place into the nose.    Bethann Humble Sulfate (VISINE-AC OP) Apply 1 drop to  eye in the morning, at noon, in the evening, and at bedtime.    . TRAMADOL HCL PO     . XOLAIR 150 MG injection Inject into the skin.    Marland Kitchen losartan (COZAAR) 25 MG tablet Take 1 tablet (25 mg total) by mouth in the morning and at bedtime. 180 tablet 3   Current Facility-Administered Medications  Medication Dose Route Frequency Provider Last Rate Last Admin  . omalizumab Arvid Right) injection 375 mg  375 mg Subcutaneous Q14 Days Kennith Gain, MD   375 mg at 10/24/20 9892    Allergies:   Amlodipine, Bee venom, Lisinopril, Other, Aspirin, Erythromycin, Hctz [hydrochlorothiazide], and Penicillins    ROS:  Please see the history of present illness.   Otherwise, review of systems are positive for none.   All other systems are reviewed and negative.    PHYSICAL EXAM: VS:  BP (!) 160/82   Pulse 81   Ht 4\' 11"  (1.499 m)   Wt 186 lb 9.6 oz (84.6 kg)   SpO2 98%   BMI 37.69 kg/m  , BMI Body mass index is 37.69 kg/m. GENERAL:  Well appearing NECK:  No jugular venous distention, waveform within normal limits, carotid upstroke brisk and symmetric, no bruits, no thyromegaly LUNGS:  Clear to auscultation bilaterally CHEST:  Unremarkable HEART:  PMI not displaced or sustained,S1 and S2 within normal limits, no S3, no S4, no clicks, no rubs, no murmurs ABD:  Flat, positive bowel sounds normal in frequency in pitch, no bruits, no rebound, no guarding, no midline pulsatile mass, no hepatomegaly, no splenomegaly EXT:  2 plus pulses throughout, mild leg edema, no cyanosis no clubbing   EKG:  EKG is  not ordered today.   Recent Labs: 08/18/2020: BNP 88.5; BUN 9; Creatinine, Ser 0.74; Hemoglobin 12.8; Platelets 171; Potassium 3.8; Sodium 145; TSH 1.960    Lipid Panel    Component Value Date/Time   CHOL 228 (H) 02/06/2016 0930   TRIG 93 02/06/2016 0930   HDL 69 02/06/2016 0930   CHOLHDL 3.3 02/06/2016 0930   VLDL 19 02/06/2016 0930   LDLCALC 140 (H) 02/06/2016 0930   LDLDIRECT 135.2  08/23/2013 1142      Wt Readings from Last 3 Encounters:  10/30/20 186 lb 9.6 oz (84.6 kg)  08/26/20 186 lb (84.4 kg)  08/18/20 186 lb (84.4 kg)      Other studies Reviewed: Additional studies/ records that were reviewed today include: Lexiscan Myoview. Review of the above records demonstrates:  Please see elsewhere in the note.  ASSESSMENT AND PLAN:  SOB:     She had normal labs including a BNP level.  She also had a negative perfusion study.  Likely her dyspnea is related to weight deconditioning allergies and asthma.  No further cardiac work-up is suggested.  Leg swelling: She had venous Dopplers without any evidence of DVT-I will order a venous Doppler to rule out DVT.  This is probably related to her arthritis.  No further cardiac work-up.  HTN: Her blood pressure is not at target.  I am getting increase her Cozaar to 25 mg twice daily.  Dyslipidemia: LDL was 137.  She agrees to add pravastatin 40 mg nightly and get a repeat lipid profile in 10 weeks.  I do not think that pravastatin will compound her many joint issues so I think she will be able to take it but she will let me know if she has any problems.    Current medicines are reviewed at length with the patient today.  The patient does not have concerns regarding medicines.  The following changes have been made: As above  Labs/ tests ordered today include:   Orders Placed This Encounter  Procedures  . Lipid panel     Disposition:   FU with me in 12 months.    Signed, Minus Breeding, MD  10/30/2020 10:06 AM    [facility]

## 2020-10-30 ENCOUNTER — Encounter: Payer: Self-pay | Admitting: Cardiology

## 2020-10-30 ENCOUNTER — Other Ambulatory Visit: Payer: Self-pay

## 2020-10-30 ENCOUNTER — Ambulatory Visit: Payer: Medicaid Other | Admitting: Cardiology

## 2020-10-30 VITALS — BP 160/82 | HR 81 | Ht 59.0 in | Wt 186.6 lb

## 2020-10-30 DIAGNOSIS — I10 Essential (primary) hypertension: Secondary | ICD-10-CM | POA: Diagnosis not present

## 2020-10-30 DIAGNOSIS — R0602 Shortness of breath: Secondary | ICD-10-CM

## 2020-10-30 DIAGNOSIS — E785 Hyperlipidemia, unspecified: Secondary | ICD-10-CM | POA: Diagnosis not present

## 2020-10-30 DIAGNOSIS — M7989 Other specified soft tissue disorders: Secondary | ICD-10-CM

## 2020-10-30 MED ORDER — PRAVASTATIN SODIUM 40 MG PO TABS
40.0000 mg | ORAL_TABLET | Freq: Every day | ORAL | 3 refills | Status: DC
Start: 2020-10-30 — End: 2020-12-29

## 2020-10-30 MED ORDER — LOSARTAN POTASSIUM 25 MG PO TABS: 25.0000 mg | ORAL_TABLET | Freq: Two times a day (BID) | ORAL | 3 refills | Status: DC

## 2020-10-30 NOTE — Patient Instructions (Signed)
Medication Instructions:  INCREASE- Losartan 25 mg by mouth twice a day START- Pravastatin 40 mg by mouth daily  *If you need a refill on your cardiac medications before your next appointment, please call your pharmacy*   Lab Work: Fasting Lipid in 8 weeks  If you have labs (blood work) drawn today and your tests are completely normal, you will receive your results only by: Marland Kitchen MyChart Message (if you have MyChart) OR . A paper copy in the mail If you have any lab test that is abnormal or we need to change your treatment, we will call you to review the results.   Testing/Procedures: None Ordered   Follow-Up: At [facility], you and your health needs are our priority.  As part of our continuing mission to provide you with exceptional heart care, we have created designated Provider Care Teams.  These Care Teams include your primary Cardiologist (physician) and Advanced Practice Providers (APPs -  Physician Assistants and Nurse Practitioners) who all work together to provide you with the care you need, when you need it.  We recommend signing up for the patient portal called "MyChart".  Sign up information is provided on this After Visit Summary.  MyChart is used to connect with patients for Virtual Visits (Telemedicine).  Patients are able to view lab/test results, encounter notes, upcoming appointments, etc.  Non-urgent messages can be sent to your provider as well.   To learn more about what you can do with MyChart, go to [email or web address].    Your next appointment:   1 year(s)  The format for your next appointment:   In Person  Provider:   You may see Minus Breeding, MD or one of the following Advanced Practice Providers on your designated Care Team:    Rosaria Ferries, PA-C  Jory Sims, DNP, ANP

## 2020-11-10 ENCOUNTER — Ambulatory Visit (INDEPENDENT_AMBULATORY_CARE_PROVIDER_SITE_OTHER): Payer: Medicaid Other | Admitting: *Deleted

## 2020-11-10 ENCOUNTER — Other Ambulatory Visit: Payer: Self-pay

## 2020-11-10 ENCOUNTER — Telehealth: Payer: Self-pay | Admitting: *Deleted

## 2020-11-10 DIAGNOSIS — J4550 Severe persistent asthma, uncomplicated: Secondary | ICD-10-CM | POA: Diagnosis not present

## 2020-11-10 NOTE — Telephone Encounter (Signed)
Patient stated that she is going to have foot surgery soon but is not sure of the date yet. She stated that she may be immobile for 2 months due to the surgery and was wondering about her Xolair injections. I did provide one option that as long as she could ambulate to a wheelchair and come in the back door we could give her her injection towards the back of the building. She does typically take public transportation so I did advise that she would need a ride. Patient verbalized understanding and stated that she has some more appointments so that she will know more by her appointment with you. She just wanted you to be aware.

## 2020-11-10 NOTE — Telephone Encounter (Signed)
Ok thanks for update.Marland Kitchen  and like your recommendation.  If she does not have transportation then will come up with another plan.

## 2020-11-24 ENCOUNTER — Ambulatory Visit (INDEPENDENT_AMBULATORY_CARE_PROVIDER_SITE_OTHER): Payer: Medicaid Other

## 2020-11-24 ENCOUNTER — Other Ambulatory Visit: Payer: Self-pay

## 2020-11-24 DIAGNOSIS — J4550 Severe persistent asthma, uncomplicated: Secondary | ICD-10-CM | POA: Diagnosis not present

## 2020-11-25 ENCOUNTER — Ambulatory Visit: Payer: Medicaid Other | Admitting: Internal Medicine

## 2020-11-27 ENCOUNTER — Encounter: Payer: Self-pay | Admitting: Allergy

## 2020-11-27 ENCOUNTER — Ambulatory Visit (INDEPENDENT_AMBULATORY_CARE_PROVIDER_SITE_OTHER): Payer: Medicaid Other | Admitting: Allergy

## 2020-11-27 ENCOUNTER — Other Ambulatory Visit: Payer: Self-pay

## 2020-11-27 VITALS — BP 110/62 | HR 75 | Temp 97.7°F | Resp 16

## 2020-11-27 DIAGNOSIS — H1012 Acute atopic conjunctivitis, left eye: Secondary | ICD-10-CM

## 2020-11-27 DIAGNOSIS — J4550 Severe persistent asthma, uncomplicated: Secondary | ICD-10-CM

## 2020-11-27 DIAGNOSIS — L299 Pruritus, unspecified: Secondary | ICD-10-CM | POA: Diagnosis not present

## 2020-11-27 DIAGNOSIS — J3089 Other allergic rhinitis: Secondary | ICD-10-CM

## 2020-11-27 MED ORDER — AZELASTINE HCL 0.1 % NA SOLN: NASAL | 3 refills | Status: DC

## 2020-11-27 NOTE — Patient Instructions (Addendum)
-   continue avoidance measures for grass pollens, weed pollens, tree pollens, mold, dust mites - continue Xyzal 5mg  daily.   - continue performing nasal saline rinses as needed.  Ensure appropriate temperature of water before proceeding.  Breathe through your mouth while performing saline rinse.   - for nasal drainage continue use of nasal antihistamine, Astelin 2 sprays each nostril as needed at this time.  - for nasal congestion/obstruction can use Nasacort 2 sprays each nostril daily for 1-2 weeks at a time before stopping once symptoms improve - continue Bepreve 1 drop each eye twice a day as needed for itchy, watery eyes  - have access to albuterol inhaler 2 puffs or albuterol 1 vial via nebulizer every 4-6 hours as needed for cough/wheeze/shortness of breath/chest tightness.  May use 15-20 minutes prior to activity.   Monitor frequency of use.    Use inhaler with spacer device.   - Symbicort 12mcg 2 puffs twice a day - Use Flovent 133mcg 2 puffs twice a day as needed if not meeting below goals  - will stop Xolair for now.  Let us know how you are feeling in about 4-6 weeks off Xolair  Asthma control goals:   Full participation in all desired activities (may need albuterol before activity)  Albuterol use two time or less a week on average (not counting use with activity)  Cough interfering with sleep two time or less a month  Oral steroids no more than once a year  No hospitalizations   Follow-up in 3 months or sooner if needed

## 2020-11-27 NOTE — Progress Notes (Signed)
Follow-up Note  RE: Kendra Gilbert MRN: [id number] DOB: September 24, 1955 Date of Office Visit: 11/27/2020   History of present illness: Kendra Gilbert is a 65 y.o. female presenting today for follow-up of asthma, rhinitis and conjunctivitis and itch.  She was last seen in the office on 07/31/2020 by myself.  She states she is doing better than she was at her last visit.  She has been staying inside more especially with the high pollen count and fluctuation in weather.  She states she has identified things that improve her overall health and things that worsen her health and she is trying to reduce the latter.  She is using albuterol via nebulizer about twice a day on average for cough, shortness of breath.  She states the albuterol inhaler she may use more with outdoor exposure.  She does her symbicort twice a day.  She is currently not needing to use Flovent as well with the Symbicort.  She is on xolair every 2 weeks.  She states she is experiencing a lot of joint pain and itching on days of the injection but she is not sure if it related to season.  She does have osteoarthritis and is not sure if her joint pain is her OA or if it may be related to Xolair use.  She states she may be headed for surgery on her foot.   She continues on xyzal and states it does still help.  She is using astelin more now with pollen and is very helpful for drainage and congestion control.  Bepreve eyedrop she states is more helpful than her previous eye drop but she still notes more symptoms with her left eye than her right.  She states the [facility] was just a regular moisturizer for her and no better or worse than over-the-counter moisturizers.     Review of systems: Review of Systems  Constitutional: Negative.   HENT:       See HPI  Eyes:       See HPI  Respiratory:       See HPI  Cardiovascular: Negative.   Gastrointestinal: Negative.   Musculoskeletal: Positive for joint pain.  Skin: Positive for itching.  Negative for rash.  Neurological: Negative.     All other systems negative unless noted above in HPI  Past medical/social/surgical/family history have been reviewed and are unchanged unless specifically indicated below.  No changes  Medication List: Current Outpatient Medications  Medication Sig Dispense Refill  . acetaminophen (TYLENOL) 325 MG tablet Take 325 mg by mouth daily as needed for mild pain, fever or headache.    . albuterol (PROVENTIL HFA) 108 (90 Base) MCG/ACT inhaler Inhale 2 puffs into the lungs every 4 (four) hours as needed for wheezing or shortness of breath. 18 g 2  . Bepotastine Besilate (BEPREVE) 1.5 % SOLN Place 1 drop into both eyes 2 (two) times daily as needed. 10 mL 5  . budesonide-formoterol (SYMBICORT) 160-4.5 MCG/ACT inhaler INHALE 2 PUFFS INTO THE LUNGS TWICE DAILY 10.2 g 5  . diphenhydrAMINE (BENADRYL) 25 MG tablet Take 25 mg by mouth every 6 (six) hours as needed.    Marland Kitchen EPINEPHrine 0.3 mg/0.3 mL IJ SOAJ injection Inject into the muscle.    . fluticasone (FLOVENT HFA) 110 MCG/ACT inhaler Inhale two puffs twice daily if needed to prevent cough or wheeze.  Rinse, gargle, and spit after use. 12 g 5  . ipratropium-albuterol (DUONEB) 0.5-2.5 (3) MG/3ML SOLN Use 1 vial via nebulizer every 4-6 hours as  needed for cough, wheeze, shortness of breath or chest tightness 180 mL 1  . levocetirizine (XYZAL) 5 MG tablet Xyzal 5 mg tablet  Take 1 tablet every day by oral route.    Marland Kitchen losartan (COZAAR) 25 MG tablet Take 1 tablet (25 mg total) by mouth in the morning and at bedtime. 180 tablet 3  . pantoprazole (PROTONIX) 40 MG tablet 20 mg daily.    . pravastatin (PRAVACHOL) 40 MG tablet Take 1 tablet (40 mg total) by mouth at bedtime. 90 tablet 3  . Sodium Chloride-Sodium Bicarb 1.57 g PACK Place into the nose.    Bethann Humble Sulfate (VISINE-AC OP) Apply 1 drop to eye in the morning, at noon, in the evening, and at bedtime.    . TRAMADOL HCL PO     . XOLAIR 150 MG  injection Inject into the skin.    Marland Kitchen azelastine (ASTELIN) 0.1 % nasal spray Use 2 sprays in each nostrils twice daily 30 mL 3   Current Facility-Administered Medications  Medication Dose Route Frequency Provider Last Rate Last Admin  . omalizumab Arvid Right) injection 375 mg  375 mg Subcutaneous Q14 Days Kennith Gain, MD   375 mg at 11/24/20 0830     Known medication allergies: Allergies  Allergen Reactions  . Amlodipine Shortness Of Breath and Swelling  . Bee Venom Anaphylaxis and Hives  . Lisinopril Itching and Cough    Dry cough and severe itching in the throat   . Other Other (See Comments)    Mosquito bites cause hives all over body  . Aspirin Other (See Comments)    Causes stomach problems  . Erythromycin Other (See Comments)    Ate the lining of her stomach  . Hctz [Hydrochlorothiazide]     Rash   . Penicillins Rash    Has patient had a PCN reaction causing immediate rash, facial/tongue/throat swelling, SOB or lightheadedness with hypotension: Yes Has patient had a PCN reaction causing severe rash involving mucus membranes or skin necrosis: Yes Has patient had a PCN reaction that required hospitalization: No  Has patient had a PCN reaction occurring within the last 10 years: No If all of the above answers are "NO", then may proceed with Cephalosporin use.      Physical examination: Blood pressure 110/62, pulse 75, temperature 97.7 F (36.5 C), temperature source Temporal, resp. rate 16, SpO2 97 %.  General: Alert, interactive, in no acute distress. HEENT: PERRLA, TMs pearly gray, turbinates minimally edematous without discharge, post-pharynx non erythematous. Neck: Supple without lymphadenopathy. Lungs: Mildly decreased breath sounds with expiratory wheezing bilaterally. {no increased work of breathing. CV: Normal S1, S2 without murmurs. Abdomen: Nondistended, nontender. Skin: Warm and dry, without lesions or rashes. Extremities:  No clubbing, cyanosis  or edema. Neuro:   Grossly intact.   Diagnositics/Labs:  Spirometry: FEV1: 1.23L 76%, FVC: 1.48L 84% predicted.  This is the spirometry she has performed today.  Her FEV1 is normal for age/demographic  Assessment and plan:   Allergic rhinitis with conjunctivitis Pruritus - continue avoidance measures for grass pollens, weed pollens, tree pollens, mold, dust mites - continue Xyzal 5mg  daily.   - continue performing nasal saline rinses as needed.  Ensure appropriate temperature of water before proceeding.  Breathe through your mouth while performing saline rinse.   - for nasal drainage continue use of nasal antihistamine, Astelin 2 sprays each nostril as needed at this time.  - for nasal congestion/obstruction can use Nasacort 2 sprays each nostril daily for 1-2 weeks  at a time before stopping once symptoms improve - continue Bepreve 1 drop each eye twice a day as needed for itchy, watery eyes  Severe persistent asthma - have access to albuterol inhaler 2 puffs or albuterol 1 vial via nebulizer every 4-6 hours as needed for cough/wheeze/shortness of breath/chest tightness.  May use 15-20 minutes prior to activity.   Monitor frequency of use.    Use inhaler with spacer device.   - Symbicort 148mcg 2 puffs twice a day - Use Flovent 155mcg 2 puffs twice a day as needed if not meeting below goals  - will stop Xolair for now.  Let us know how you are feeling in about 4-6 weeks off Xolair  Asthma control goals:   Full participation in all desired activities (may need albuterol before activity)  Albuterol use two time or less a week on average (not counting use with activity)  Cough interfering with sleep two time or less a month  Oral steroids no more than once a year  No hospitalizations   Follow-up in 3 months or sooner if needed   I appreciate the opportunity to take part in Kendra Gilbert's care. Please do not hesitate to contact me with questions.  Sincerely,   Prudy Feeler,  MD Allergy/Immunology [facility]

## 2020-12-04 NOTE — Progress Notes (Signed)
Office Visit Note  Patient: Kendra Gilbert             Date of Birth: 02-05-1956           MRN: [id number]             PCP: Marda Stalker, PA-C Referring: Iran Planas, MD Visit Date: 12/05/2020   Subjective:  Other (Patient reports rash on cheeks that has been ongoing for a while. Patient reports pain and swelling in bilateral hands, bilateral feet/ankles and right elbow.)   History of Present Illness: Kendra Gilbert is a 65 y.o. female here for evaluation of rheumatoid arthritis affecting both hands. She has a long history with reportedly a diagnosis of RA since age adolescence and took HCQ at one point but has been off any treatment for many years. More recently was informed she has osteoarthritis of her joints. 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  She has been noticing pain and swelling occurring in both hands both feet and ankles intermittently but can be more problematic over time.  Particular has had issues with the right elbow wrist and base of the thumb and in her left ankle which she has been seeing for posterior tibial tendon dysfunction.  She notices occasional swelling but mostly has had bony enlargement of distal finger joints but also in the right wrist and base of the thumb.  She underwent fairly recent injection of seems to be the right first [facility] joint but did not have a great amount of relief.  She has discussed with her orthopedic surgeons whether surgical management would be beneficial or if there is a recommendation for additional medical treatments of an inflammatory arthritis.  She is also been experiencing facial rashes for quite some time that she most often notices exacerbated by heat exposure such as outdoors hot showers and some increased with the mask wearing.   Activities of Daily Living:   Patient reports morning stiffness for 30  minutes.   Patient Reports nocturnal pain.  Difficulty dressing/grooming: Denies Difficulty climbing stairs: Reports Difficulty getting out of chair: Reports Difficulty using hands for taps, buttons, cutlery, and/or writing: Reports  Review of Systems  Constitutional: Positive for fatigue.  HENT: Positive for mouth dryness and nose dryness. Negative for mouth sores.   Eyes: Positive for pain, itching and dryness.  Respiratory: Positive for shortness of breath and difficulty breathing.   Cardiovascular: Positive for chest pain and swelling in legs/feet. Negative for palpitations.  Gastrointestinal: Positive for constipation and diarrhea. Negative for blood in stool.  Endocrine: Negative for increased urination.  Genitourinary: Negative for difficulty urinating.  Musculoskeletal: Positive for arthralgias, joint pain, joint swelling, myalgias, morning stiffness and myalgias. Negative for muscle tenderness.  Skin: Positive for color change, rash and redness.  Allergic/Immunologic: Positive for susceptible to infections.  Neurological: Positive for dizziness, numbness and headaches. Negative for memory loss.  Hematological: Positive for bruising/bleeding tendency.  Psychiatric/Behavioral: Negative for confusion and sleep disturbance.    PMFS History:  Patient Active Problem List   Diagnosis Date Noted  . History of rheumatoid arthritis 12/05/2020  . Bilateral hand pain 12/05/2020  . Localized edema 08/25/2019  . Educated about COVID-19 virus infection 08/25/2019  . Alternating constipation and diarrhea 04/23/2019  . Blood in feces 04/23/2019  . Epigastric pain 04/23/2019  . Gastroesophageal reflux disease 04/23/2019  . Hematochezia  04/23/2019  . Hypercholesterolemia 04/23/2019  . Irritable bowel syndrome without diarrhea 04/23/2019  . Polyp of colon 04/23/2019  . Essential hypertension 02/14/2019  . Fibromyalgia 01/16/2019  . Primary  osteoarthritis involving multiple joints 01/16/2019  . DDD (degenerative disc disease), cervical 09/20/2018  . Chest pain 02/17/2016  . Dyslipidemia 02/17/2016  . Family history of heart disease   . Dyspnea 01/20/2016  . Multiple pulmonary nodules determined by computed tomography of lung 01/20/2016  . Unspecified osteoarthritis, unspecified site 11/16/2013  . Coronary artery calcification 08/23/2013  . Upper airway cough syndrome 08/19/2013  . Exertional chest pain 08/19/2013  . Restless leg syndrome 11/14/2012  . Sleep apnea 10/03/2012  . Interstitial lung disease ([place]) 04/12/2011  . Wheezing 04/12/2011    Past Medical History:  Diagnosis Date  . Asthma   . Chronic kidney disease    cyst in left kidney  . Colon cancer ([place]) 2017   surgical tx  removal of polyp  . Coronary artery calcification    a. POET in 2015 with no acute findings.  . Diarrhea since 02-16-17   c dif test normal per pt  . Dyspnea     with exertion, occ at rest no oxygen or inhaler use  . GERD (gastroesophageal reflux disease)   . H/O: rheumatic fever as child  . Headache    occ left side migraine  . Hypertension   . Lung disease    Lung nodule  . 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 Pneumonia 2016  . Rheumatoid arthritis (HCC)    oa also, back arthritis, hx rheumatoid     Family History  Problem Relation Age of Onset  . CAD Mother 28       CABG/stents  . Breast cancer Mother   . Kidney failure Mother   . Asthma Father   . CAD Father 24  . Allergies Father   . CAD Brother   . CAD Brother 87       9 stents  . CAD Brother 55       7 stents  . Heart attack Brother    Past Surgical History:  Procedure Laterality Date  . ABDOMINAL HYSTERECTOMY     ovaries left  . COLONOSCOPY WITH PROPOFOL N/A 03/23/2017   Procedure: COLONOSCOPY WITH PROPOFOL;  Surgeon: Arta Silence, MD;  Location: [facility];  Service: Endoscopy;  Laterality: N/A;  . colonscopy  2017  .  DILATION AND CURETTAGE OF UTERUS     few done for endometriosis   Social History   Social History Narrative   Lives alone.     Immunization History  Administered Date(s) Administered  . Moderna Sars-Covid-2 Vaccination 10/02/2019, 10/30/2019, 05/05/2020, 10/13/2020     Objective: Vital Signs: BP 120/77 (BP Location: Right Arm, Patient Position: Sitting, Cuff Size: Normal)   Pulse 86   Resp 16   Ht 4' 9.25" (1.454 m)   Wt 181 lb (82.1 kg)   BMI 38.83 kg/m    Physical Exam HENT:     Right Ear: External ear normal.     Left Ear: External ear normal.     Mouth/Throat:     Mouth: Mucous membranes are moist.     Pharynx: Oropharynx is clear.  Eyes:     Conjunctiva/sclera: Conjunctivae normal.  Cardiovascular:     Rate and Rhythm: Normal rate and regular rhythm.  Pulmonary:     Effort: Pulmonary effort is normal.     Breath sounds: Normal breath  sounds.  Skin:    General: Skin is warm and dry.     Comments: Central facial erythema with scattered papules over the involved region also scattered throughout face Varicose and tortuous veins in the legs especially at level of knees and ankles bilaterally  Neurological:     General: No focal deficit present.     Mental Status: She is alert.  Psychiatric:        Mood and Affect: Mood normal.     Musculoskeletal Exam:  Shoulder ROM intact no swelling present Tenderness at right 1st CMC joint, Large heberdon nodules of DIP joints worst 2nd-3rd digits of both hands, no palpable synovitis Ankle pain with dorisflexion and inversion bilaterally, worst at dorsum of foot Negative MTP squeeze tenderness  Investigation: No additional findings.  Imaging: XR Hand 2 View Left  Result Date: 12/05/2020 X-ray left hand 2 views Radiocarpal and carpal joints appear normal.  Small cystic change in the base of the first metacarpal.  MCP joint spaces appear normal.  PIP joints appear normal DIP joints show severe joint space loss and large  lateral osteophyte formation with possible central erosive change at third digit.  No periosteal reaction is seen.  Bone mineralization appears normal. Impression X-ray demonstrates advanced osteoarthritis change of DIP joints with possible central joint erosion  XR Hand 2 View Right  Result Date: 12/05/2020 X-ray right hand 2 views Radiocarpal joint space appears normal.  There is degenerative change of the first [facility] joint with partial joint subluxation.  MCP joints appear normal.  PIP joints appear normal DIP joints demonstrate extensive joint space loss and lateral osteophyte formation worst at the second and third digits.  Questionable central erosion present.  Bone mineralization appears normal. Impression Osteoarthritis changes of the first [facility] joint and advanced at DIP joints suspicious for early central erosions   Recent Labs: Lab Results  Component Value Date   WBC 7.0 08/18/2020   HGB 12.8 08/18/2020   PLT 171 08/18/2020   NA 145 (H) 08/18/2020   K 3.8 08/18/2020   CL 105 08/18/2020   CO2 21 08/18/2020   GLUCOSE 77 08/18/2020   BUN 9 08/18/2020   CREATININE 0.74 08/18/2020   BILITOT 0.7 06/02/2017   ALKPHOS 111 06/02/2017   AST 42 (H) 06/02/2017   ALT 43 06/02/2017   PROT 7.9 06/02/2017   ALBUMIN 4.0 06/02/2017   CALCIUM 9.5 08/18/2020   GFRAA 99 08/18/2020    Speciality Comments: No specialty comments available.  Procedures:  No procedures performed Allergies: Amlodipine, Bee venom, Lisinopril, Other, Aspirin, Erythromycin, Hctz [hydrochlorothiazide], and Penicillins   Assessment / Plan:     Visit Diagnoses: Primary osteoarthritis involving multiple joints - Plan: XR Hand 2 View Right, XR Hand 2 View Left  There is evident osteoarthritic change with bony nodulocystic several joints we will check x-ray of the bilateral hands to also assess for any evidence of erosive or inflammatory joint change.  History of rheumatoid arthritis  Bilateral hand pain - Plan: XR Hand  2 View Right, XR Hand 2 View Left, Rheumatoid factor, Cyclic citrul peptide antibody, IgG, Sedimentation rate, Anti-DNA antibody, double-stranded, C3 and C4, RNP Antibody  Describes apparently history with rheumatoid arthritis though has been off any kind of immunosuppressive treatment for many years without evident deformity or erosive disease noted.  Did not see any active synovitis on physical exam today.  Checking radiographs again today.  Also will check serology with RF, CCP, sed rate, dsDNA complements and RNP antibody also  for any evidence of ANA related disease.  Interstitial lung disease ([place])  Previous evaluations have had a concern for interstitial lung disease that was not on any particular treatment for this with immunosuppression.  Can be future for longstanding seropositive RA evaluating labs as above.  Orders: Orders Placed This Encounter  Procedures  . XR Hand 2 View Right  . XR Hand 2 View Left  . Rheumatoid factor  . Cyclic citrul peptide antibody, IgG  . Sedimentation rate  . Anti-DNA antibody, double-stranded  . C3 and C4  . RNP Antibody   No orders of the defined types were placed in this encounter.    Follow-Up Instructions: Return in about 2 weeks (around 12/19/2020) for New pt f/u [facility] vs RA evaluation.   Collier Salina, MD  Note - This record has been created using [employer].  Chart creation errors have been sought, but may not always  have been located. Such creation errors do not reflect on  the standard of medical care.

## 2020-12-05 ENCOUNTER — Ambulatory Visit: Payer: Medicaid Other | Admitting: Internal Medicine

## 2020-12-05 ENCOUNTER — Ambulatory Visit: Payer: Self-pay

## 2020-12-05 ENCOUNTER — Other Ambulatory Visit: Payer: Self-pay

## 2020-12-05 ENCOUNTER — Encounter: Payer: Self-pay | Admitting: Internal Medicine

## 2020-12-05 VITALS — BP 120/77 | HR 86 | Resp 16 | Ht <= 58 in | Wt 181.0 lb

## 2020-12-05 DIAGNOSIS — J849 Interstitial pulmonary disease, unspecified: Secondary | ICD-10-CM

## 2020-12-05 DIAGNOSIS — M79642 Pain in left hand: Secondary | ICD-10-CM | POA: Diagnosis not present

## 2020-12-05 DIAGNOSIS — Z8739 Personal history of other diseases of the musculoskeletal system and connective tissue: Secondary | ICD-10-CM | POA: Diagnosis not present

## 2020-12-05 DIAGNOSIS — M79641 Pain in right hand: Secondary | ICD-10-CM | POA: Diagnosis not present

## 2020-12-05 DIAGNOSIS — M8949 Other hypertrophic osteoarthropathy, multiple sites: Secondary | ICD-10-CM

## 2020-12-05 DIAGNOSIS — M159 Polyosteoarthritis, unspecified: Secondary | ICD-10-CM

## 2020-12-05 NOTE — Patient Instructions (Addendum)
Erythrocyte Sedimentation Rate Test Why am I having this test? The erythrocyte sedimentation rate (ESR) test is used to help find illnesses related to:  Sudden (acute) or long-term (chronic) infections.  Inflammation.  The body's disease-fighting system attacking healthy cells (autoimmune diseases).  Cancer.  Tissue death. If you have symptoms that may be related to any of these illnesses, your health care provider may do an ESR test before doing more specific tests. If you have an inflammatory immune disease, such as rheumatoid arthritis, you may have this test to help monitor your therapy. What is being tested? This test measures how long it takes for your red blood cells (erythrocytes) to settle in a solution over a certain amount of time (sedimentation rate). When you have an infection or inflammation, your red blood cells clump together and settle faster. The sedimentation rate provides information about how much inflammation is present in the body. What kind of sample is taken? A blood sample is required for this test. It is usually collected by inserting a needle into a blood vessel.   How do I prepare for this test? Follow any instructions from your health care provider about changing or stopping your regular medicines. Tell a health care provider about:  Any allergies you have.  All medicines you are taking, including vitamins, herbs, eye drops, creams, and over-the-counter medicines.  Any blood disorders you have.  Any surgeries you have had.  Any medical conditions you have, such as thyroid or kidney disease.  Whether you are pregnant or may be pregnant. How are the results reported? Your results will be reported as a value that measures sedimentation rate in millimeters per hour (mm/hr). Your health care provider will compare your results to normal ranges that were established after testing a large group of people (reference values). Reference values may vary among  labs and hospitals. For this test, common reference values, which vary by age and gender, are:  Newborn: 0-2 mm/hr.  Child, up to puberty: 0-10 mm/hr.  Female: ? Under 50 years: 0-20 mm/hr. ? 50-85 years: 0-30 mm/hr. ? Over 85 years: 0-42 mm/hr.  Female: ? Under 50 years: 0-15 mm/hr. ? 50-85 years: 0-20 mm/hr. ? Over 85 years: 0-30 mm/hr. Certain conditions or medicines may cause ESR levels to be falsely lower or higher, such as:  Pregnancy.  Obesity.  Steroids, birth control pills, and blood thinners.  Thyroid or kidney disease. What do the results mean? Results that are within reference values are considered normal, meaning that the level of inflammation in your body is healthy. High ESR levels mean that there is inflammation in your body. You will have more tests to help make a diagnosis. Inflammation may result from many different conditions or injuries. Talk with your health care provider about what your results mean. Questions to ask your health care provider Ask your health care provider, or the department that is doing the test:  When will my results be ready?  How will I get my results?  What are my treatment options?  What other tests do I need?  What are my next steps? Summary  The erythrocyte sedimentation rate (ESR) test is used to help find illnesses associated with sudden (acute) or long-term (chronic) infections, inflammation, autoimmune diseases, cancer, or tissue death.  If you have symptoms that may be related to any of these illnesses, your health care provider may do an ESR test before doing more specific tests. If you have an inflammatory immune disease, such as  rheumatoid arthritis, you may have this test to help monitor your therapy.  This test measures how long it takes for your red blood cells (erythrocytes) to settle in a solution over a certain amount of time (sedimentation rate). This provides information about how much inflammation is present  in the body.   Anticyclic-Citrullinated Peptide Antibody Test Why am I having this test? You may have the anticyclic-citrullinated peptide antibody test done to help:  Diagnose rheumatoid arthritis (RA). RA is a long-term (chronic) disease that causes inflammation in the joints.  Determine the severity of your RA, including how much worse it is getting (progression). This test may be done if you have unexplained joint inflammation and have previously tested negative for rheumatoid factor. It may also be done if you have been diagnosed with undifferentiated arthritis and your health care provider suspects rheumatoid arthritis. What is being tested? This test checks your blood for the presence of anticyclic-citrullinated peptide antibodies. Antibodies are cells that are part of the body's disease-fighting (immune) system. These antibodies appear early in the course of RA and are thought to be directly involved in the progression of the disease. What kind of sample is taken? A blood sample is required for this test. It is usually collected by inserting a needle into a blood vessel.   How are the results reported? Your test results will be reported as either positive or negative. A result is considered negative if there is less than 20 units of the antibody per mL of blood. What do the results mean?  A positive blood test may mean that you have RA.  A negative blood test means that it is less likely that you have RA. However, a negative test does not completely rule out rheumatoid arthritis. Talk with your health care provider about what your results mean. Questions to ask your health care provider Ask your health care provider, or the department that is doing the test:  When will my results be ready?  How will I get my results?  What are my treatment options?  What other tests do I need?  What are my next steps? Summary  The anticyclic-citrullinated peptide antibody blood test may  be done to help your health care provider diagnose rheumatoid arthritis (RA).  This test checks your blood for the presence of anticyclic-citrullinated peptide antibodies. These antibodies appear early in the course of RA.  A positive blood test may mean that you have RA.   Anti-DNA Antibody Test Why am I having this test? The anti-DNA antibody test helps with the diagnosis and follow-up of systemic lupus erythematosus (SLE). It is also used to monitor treatment of this condition as the antibody decreases with successful therapy. What is being tested? This test measures the amount of anti-DNA antibody in the blood. This antibody is found in 65-80% of patients with active SLE. This antibody is not as common in patients who have other diseases. What kind of sample is taken? A blood sample is required for this test. It is usually collected by inserting a needle into a blood vessel.   How are the results reported? Your test results will be reported as a value. Your test results may also be reported as positive, intermediate, or negative. Your health care provider will compare your results to normal ranges that were established after testing a large group of people (reference values). Reference values may vary among labs and hospitals. For this test, common reference values are:  Positive: 10 or more international  units/mL.  Intermediate: 5-9 international units/mL.  Negative: Less than 5 international units/mL. What do the results mean? Positive results, which are associated with results that are higher than the reference values, may indicate:  Autoimmune disorders such as SLE.  Infectious mononucleosis.  Chronic liver conditions. Intermediate results mean that the anti-DNA antibody levels are higher than normal, but not high enough to be considered positive. Negative results mean that you do not have the anti-DNA antibody that is associated with these conditions. Talk with your health  care provider about what your results mean. Questions to ask your health care provider Ask your health care provider, or the department that is doing the test:  When will my results be ready?  How will I get my results?  What are my treatment options?  What other tests do I need?  What are my next steps? Summary  The anti-DNA antibody test helps with the diagnosis and follow-up of systemic lupus erythematosus (SLE). It is also used to monitor treatment of this condition as the antibody decreases with successful therapy.  This test measures the amount of anti-DNA antibody in the blood.  Elevated levels of anti-DNA antibody can be seen in patients with SLE and certain other conditions.   Complement Assay Test Why am I having this test? Complement refers to a group of proteins that are part of the body's disease-fighting system (immune system). A complement assay test provides information about some or all of these proteins. You may have this test:  To diagnose a lack, or deficiency, of certain complement proteins. Deficiencies can be passed from parent to child (inherited).  To monitor an infection or autoimmune disease.  If you have unexplained inflammation or swelling (edema).  If you have bacterial infections again and again. What is being tested? This test can be used to measure:  Total complement. This is the total number of protein complements in your blood.  The number of each kind of complement in your blood. The nine main kinds of complement are labeled C1 through C9. Some of these complements, such as C3 and C4, are especially important and have many functions in the body. Depending on why you are having the test, your health care provider may test your total complement or only some individual complements, such as C3 and C4. The total complement assay test may be done before individual complements are tested. What kind of sample is taken? A blood sample is required  for this test. It is usually collected by inserting a needle into a blood vessel.   Tell a health care provider about:  Any allergies you have.  All medicines you are taking, including vitamins, herbs, eye drops, creams, and over-the-counter medicines.  Any blood disorders you have.  Any surgeries you have had.  Any medical conditions you have.  Whether you are pregnant or may be pregnant. How are the results reported? Your results will be reported as a value that tells you how much complement is in your blood. This will be given as units per milliliter of blood (units/mL) or as milligrams per deciliter of blood (mg/dL). Your results may be reported as total complement, or as individual complements, or both. Your health care provider will compare your results to normal ranges that were established after testing a large group of people (reference ranges). Reference ranges may vary among labs and hospitals. For this test, reference ranges for some of the most commonly measured complement assays may be:  Total complement: 30-75 units/mL.  C2: 1-4 mg/dL.  C3: 75-175 mg/dL.  C4: 22-45 units/mL. What do the results mean? Results within reference ranges are considered normal, which means you have a normal amount of complement in your blood. Results that are higher than the reference ranges may be caused by:  Inflammatory disease.  Heart attack.  Cancer. Complement deficiencies, or results lower than the reference ranges, may be caused by:  Certain inherited conditions.  Autoimmune disease.  Certain liver diseases.  Malnutrition.  Certain types of anemia that result in breakdown of red blood cells (hemolytic anemia). Talk with your health care provider about what your results mean. Questions to ask your health care provider Ask your health care provider, or the department that is doing the test:  When will my results be ready?  How will I get my results?  What are my  treatment options?  What other tests do I need?  What are my next steps? Summary  Complement refers to a group of proteins that are part of the body's disease-fighting system (immune system). A complement assay test can provide information about some or all of these proteins.  You may have a complement assay test to help diagnose a complement deficiency, and to monitor some infections or autoimmune disease.  Talk with your health care provider about what your results mean. This information is not intended to replace advice given to you by your health care provider. Make sure you discuss any questions you have with your health care provider. Document Revised: 04/17/2020 Document Reviewed: 04/17/2020 Elsevier Patient Education  2021 [employer].

## 2020-12-08 LAB — CYCLIC CITRUL PEPTIDE ANTIBODY, IGG: Cyclic Citrullin Peptide Ab: <16 UNITS

## 2020-12-08 LAB — C3 AND C4
C3 Complement: 193 mg/dL (ref 83–193)
C4 Complement: 22 mg/dL (ref 15–57)

## 2020-12-08 LAB — SEDIMENTATION RATE: Sed Rate: 89 mm/h — ABNORMAL HIGH (ref 0–30)

## 2020-12-08 LAB — RNP ANTIBODY: Ribonucleic Protein(ENA) Antibody, IgG: <1 AI

## 2020-12-08 LAB — RHEUMATOID FACTOR: Rheumatoid fact SerPl-aCnc: <14 IU/mL (ref <14)

## 2020-12-08 LAB — ANTI-DNA ANTIBODY, DOUBLE-STRANDED: ds DNA Ab: 4 IU/mL

## 2020-12-10 ENCOUNTER — Ambulatory Visit: Payer: Self-pay

## 2020-12-11 ENCOUNTER — Ambulatory Visit: Payer: Medicaid Other | Admitting: Internal Medicine

## 2020-12-12 ENCOUNTER — Telehealth: Payer: Self-pay | Admitting: *Deleted

## 2020-12-12 NOTE — Telephone Encounter (Signed)
Patient called and stated that from her office visit with Dr. Nelva Bush in May Dr. Nelva Bush advise to stop Xolair for now and to call with an update in 4-6 weeks in regards to her asthma symptoms and symptoms of joint pain and itching on her injection days. Patient was wondering does she need to call the specialty pharmacy and inform them that she has stopped Xolair?

## 2020-12-19 ENCOUNTER — Encounter: Payer: Self-pay | Admitting: Internal Medicine

## 2020-12-19 ENCOUNTER — Ambulatory Visit: Payer: Medicaid Other | Admitting: Internal Medicine

## 2020-12-19 ENCOUNTER — Other Ambulatory Visit: Payer: Self-pay

## 2020-12-19 VITALS — BP 114/75 | HR 80 | Ht 59.0 in | Wt 179.2 lb

## 2020-12-19 DIAGNOSIS — M6702 Short Achilles tendon (acquired), left ankle: Secondary | ICD-10-CM

## 2020-12-19 DIAGNOSIS — M154 Erosive (osteo)arthritis: Secondary | ICD-10-CM | POA: Diagnosis not present

## 2020-12-19 DIAGNOSIS — Z8739 Personal history of other diseases of the musculoskeletal system and connective tissue: Secondary | ICD-10-CM | POA: Diagnosis not present

## 2020-12-19 NOTE — Progress Notes (Signed)
Office Visit Note  Patient: Kendra Gilbert             Date of Birth: 1955-11-10           MRN: [id number]             PCP: Marda Stalker, PA-C Referring: Marda Stalker, PA-C Visit Date: 12/19/2020   Subjective:  Follow-up (Patient denies changes in symptoms since last office visit. )   History of Present Illness: Kendra Gilbert is a 65 y.o. female here for follow up for her inflammatory joint pain and history of RA. Lab testing at initial visit was negative for RA associated antibodies or ENA panel elements not already excluded. Her sedimentation rate was quite positive at 89. Xrays reviewed did not show much inflammatory arthritis change in her feet but hands showed DIP predominant change with early central erosions consistent with erosive OA. She has previously already tried joint injection without significant benefit, oral steroids with excessive weight gain, limited tolerance for NSAIDs with GI irritation and asthma symptoms, PT and OT limited benefit, and currently wears right wrist and left ankle braces.    Previous HPI Summary: She has reported histroy of RA since many years ago not consistently on treatment. She has been noticing pain and swelling occurring in both hands both feet and ankles intermittently but can be more problematic over time.  Particular has had issues with the right elbow wrist and base of the thumb and in her left ankle which she has been seeing for posterior tibial tendon dysfunction.  She notices occasional swelling but mostly has had bony enlargement of distal finger joints but also in the right wrist and base of the thumb.  She underwent fairly recent injection of seems to be the right first [facility] joint but did not have a great amount of relief.  She has discussed with her orthopedic surgeons whether surgical management would be beneficial or if there is a recommendation for additional medical treatments of an inflammatory arthritis.  She is also  been experiencing facial rashes for quite some time that she most often notices exacerbated by heat exposure such as outdoors hot showers and some increased with the mask wearing.   Review of Systems  Cardiovascular:  Positive for swelling in legs/feet.  Musculoskeletal:  Positive for joint pain, joint pain and joint swelling.  Skin:  Positive for redness.   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 06/30/2020   Radial styloid tenosynovitis of right hand 06/17/2020   Pain and swelling of right wrist 05/02/2020   Localized edema 08/25/2019   Educated about COVID-19 virus infection 08/25/2019   Alternating constipation and diarrhea 04/23/2019   Blood in feces 04/23/2019   Epigastric pain 04/23/2019   Gastroesophageal reflux disease 04/23/2019   Hematochezia 04/23/2019   Hypercholesterolemia 04/23/2019   Irritable bowel syndrome without diarrhea 04/23/2019   Polyp of colon 04/23/2019   Essential hypertension 02/14/2019   Fibromyalgia 01/16/2019   Primary osteoarthritis involving multiple joints 01/16/2019   DDD (degenerative disc disease), cervical 09/20/2018   Chest pain 02/17/2016   Dyslipidemia 02/17/2016   Family history of heart disease    Dyspnea 01/20/2016   Multiple pulmonary nodules determined by computed tomography of  lung 01/20/2016   Unspecified osteoarthritis, unspecified site 11/16/2013   Coronary artery calcification 08/23/2013   Upper airway cough syndrome 08/19/2013   Exertional chest pain 08/19/2013   Restless leg syndrome 11/14/2012   Sleep apnea 10/03/2012   Interstitial lung disease ([place]) 04/12/2011   Wheezing 04/12/2011    Past Medical History:  Diagnosis Date   Asthma     Chronic kidney disease    cyst in left kidney   Colon cancer ([place]) 2017   surgical tx  removal of polyp   Coronary artery calcification    a. POET in 2015 with no acute findings.   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   no current issues with   Pneumonia 2016   Rheumatoid arthritis ([place])    oa also, back arthritis, hx rheumatoid     Family History  Problem Relation Age of Onset   CAD Mother 47       CABG/stents   Breast cancer Mother    Kidney failure Mother    Asthma Father    CAD Father 46   Allergies Father    CAD Brother    CAD Brother 64       9 stents   CAD Brother 64       7 stents   Heart attack Brother    Past Surgical History:  Procedure Laterality Date   ABDOMINAL HYSTERECTOMY     ovaries left   COLONOSCOPY WITH PROPOFOL N/A 03/23/2017   Procedure: COLONOSCOPY WITH PROPOFOL;  Surgeon: Arta Silence, MD;  Location: [facility];  Service: Endoscopy;  Laterality: N/A;   colonscopy  2017   DILATION AND CURETTAGE OF UTERUS     few done for endometriosis   Social History   Social History Narrative   Lives alone.     Immunization History  Administered Date(s) Administered   [employer] Vaccination 10/02/2019, 10/30/2019, 05/05/2020, 10/13/2020     Objective: Vital Signs: BP 114/75 (BP Location: Left Arm, Patient Position: Sitting, Cuff Size: Normal)   Pulse 80   Ht 4\' 11"  (1.499 m)   Wt 179 lb 3.2 oz (81.3 kg)   BMI 36.19 kg/m    Physical Exam Skin:    General: Skin is warm and dry.     Comments: Central facial erythema with scattered papules Varicose and tortuous veins in the legs especially at level of knees and ankles bilaterally, pitting edema above level  of ankle brace on left side     Musculoskeletal Exam:  Shoulder ROM intact no swelling present Tenderness at right 1st [facility] joint, Large heberdon nodules of DIP joints worst 2nd-3rd digits of both hands, no palpable synovitis Ankle pain with dorisflexion and inversion bilaterally, worst at dorsum of foot Negative MTP squeeze tenderness   Investigation: No additional findings.  Imaging: XR Hand 2 View Left  Result Date: 12/05/2020 X-ray left hand 2 views Radiocarpal and carpal joints appear normal.  Small cystic change in the base of the first metacarpal.  MCP joint spaces appear normal.  PIP joints appear normal DIP joints show severe joint space loss and large lateral osteophyte formation with possible  central erosive change at third digit.  No periosteal reaction is seen.  Bone mineralization appears normal. Impression X-ray demonstrates advanced osteoarthritis change of DIP joints with possible central joint erosion  XR Hand 2 View Right  Result Date: 12/05/2020 X-ray right hand 2 views Radiocarpal joint space appears normal.  There is degenerative change of the first [facility] joint with partial joint subluxation.  MCP joints appear normal.  PIP joints appear normal DIP joints demonstrate extensive joint space loss and lateral osteophyte formation worst at the second and third digits.  Questionable central erosion present.  Bone mineralization appears normal. Impression Osteoarthritis changes of the first [facility] joint and advanced at DIP joints suspicious for early central erosions   Recent Labs: Lab Results  Component Value Date   WBC 7.0 08/18/2020   HGB 12.8 08/18/2020   PLT 171 08/18/2020   NA 145 (H) 08/18/2020   K 3.8 08/18/2020   CL 105 08/18/2020   CO2 21 08/18/2020   GLUCOSE 77 08/18/2020   BUN 9 08/18/2020   CREATININE 0.74 08/18/2020   BILITOT 0.7 06/02/2017   ALKPHOS 111 06/02/2017   AST 42 (H) 06/02/2017   ALT 43 06/02/2017   PROT 7.9 06/02/2017   ALBUMIN 4.0 06/02/2017    CALCIUM 9.5 08/18/2020   GFRAA 99 08/18/2020    Speciality Comments: No specialty comments available.  Procedures:  No procedures performed Allergies: Amlodipine, Bee venom, Lisinopril, Other, Aspirin, Erythromycin, Hctz [hydrochlorothiazide], and Penicillins   Assessment / Plan:     Visit Diagnoses: Erosive osteoarthritis of both hands  Unfortunately she has having a lot of symptoms related to this without relief to treatments so far. Current guideline recommendations are against use of conventional and biologic DMARDs for erosive OA due to lack of efficacy and potential for toxicity side effects. She may benefit with orthopedic surgery treatment for the 1st [facility] joint arthritis and radial tenosynovitis issues that are more symptomatic than her DIP joint erosions currently.  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  History of rheumatoid arthritis  She has a history of RA reports diagnosis decades ago but I do not see typical joint involvement and negative serology and symptoms better explained by alternate diagnosis.  Orders: No orders of the defined types were placed in this encounter.  No orders of the defined types were placed in this encounter.    Follow-Up Instructions: Return if symptoms worsen or fail to improve.   Collier Salina, MD  Note - This record has been created using [employer].  Chart creation errors have been sought, but may not always  have been located. Such creation errors do not reflect on  the standard of medical care.

## 2020-12-19 NOTE — Progress Notes (Deleted)
Office Visit Note  Patient: Kendra Gilbert             Date of Birth: 1955/12/11           MRN: [id number]             PCP: Marda Stalker, PA-C Referring: Marda Stalker, PA-C Visit Date: 12/19/2020 Occupation: @GUAROCC @  Subjective:  No chief complaint on file.   History of Present Illness: Kendra Gilbert is a 65 y.o. female ***   Activities of Daily Living:  Patient reports morning stiffness for *** {minute/hour:19697}.   Patient {ACTIONS;DENIES/REPORTS:21021675::"Denies"} nocturnal pain.  Difficulty dressing/grooming: {ACTIONS;DENIES/REPORTS:21021675::"Denies"} Difficulty climbing stairs: {ACTIONS;DENIES/REPORTS:21021675::"Denies"} Difficulty getting out of chair: {ACTIONS;DENIES/REPORTS:21021675::"Denies"} Difficulty using hands for taps, buttons, cutlery, and/or writing: {ACTIONS;DENIES/REPORTS:21021675::"Denies"}  Review of Systems  Constitutional:  Positive for fatigue.  HENT:  Negative for mouth sores, mouth dryness and nose dryness.   Eyes:  Positive for pain, itching and visual disturbance. Negative for dryness.  Respiratory:  Positive for cough, shortness of breath and difficulty breathing. Negative for hemoptysis.   Cardiovascular:  Positive for chest pain and palpitations. Negative for swelling in legs/feet.  Gastrointestinal:  Positive for abdominal pain, constipation and diarrhea. Negative for blood in stool.  Endocrine: Negative for increased urination.  Genitourinary:  Negative for painful urination.  Musculoskeletal:  Positive for joint pain, joint pain, joint swelling, myalgias, muscle weakness, morning stiffness, muscle tenderness and myalgias.  Skin:  Positive for color change, rash and redness.  Allergic/Immunologic: Negative for susceptible to infections.  Neurological:  Positive for dizziness and headaches. Negative for numbness, memory loss and weakness.  Hematological:  Negative for swollen glands.  Psychiatric/Behavioral:  Negative for  confusion and sleep disturbance.    PMFS History:  Patient Active Problem List   Diagnosis Date Noted   History of rheumatoid arthritis 12/05/2020   Bilateral hand pain 12/05/2020   Localized edema 08/25/2019   Educated about COVID-19 virus infection 08/25/2019   Alternating constipation and diarrhea 04/23/2019   Blood in feces 04/23/2019   Epigastric pain 04/23/2019   Gastroesophageal reflux disease 04/23/2019   Hematochezia 04/23/2019   Hypercholesterolemia 04/23/2019   Irritable bowel syndrome without diarrhea 04/23/2019   Polyp of colon 04/23/2019   Essential hypertension 02/14/2019   Fibromyalgia 01/16/2019   Primary osteoarthritis involving multiple joints 01/16/2019   DDD (degenerative disc disease), cervical 09/20/2018   Chest pain 02/17/2016   Dyslipidemia 02/17/2016   Family history of heart disease    Dyspnea 01/20/2016   Multiple pulmonary nodules determined by computed tomography of lung 01/20/2016   Unspecified osteoarthritis, unspecified site 11/16/2013   Coronary artery calcification 08/23/2013   Upper airway cough syndrome 08/19/2013   Exertional chest pain 08/19/2013   Restless leg syndrome 11/14/2012   Sleep apnea 10/03/2012   Interstitial lung disease ([place]) 04/12/2011   Wheezing 04/12/2011    Past Medical History:  Diagnosis Date   Asthma    Chronic kidney disease    cyst in left kidney   Colon cancer ([place]) 2017   surgical tx  removal of polyp   Coronary artery calcification    a. POET in 2015 with no acute findings.   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   no current issues with   Pneumonia 2016   Rheumatoid arthritis ([place])    oa also, back arthritis, hx rheumatoid      Family History  Problem Relation Age of Onset   CAD Mother 75       CABG/stents   Breast cancer Mother    Kidney failure Mother    Asthma Father    CAD Father 74   Allergies Father    CAD Brother    CAD Brother 73       9 stents   CAD Brother 51       7 stents   Heart attack Brother    Past Surgical History:  Procedure Laterality Date   ABDOMINAL HYSTERECTOMY     ovaries left   COLONOSCOPY WITH PROPOFOL N/A 03/23/2017   Procedure: COLONOSCOPY WITH PROPOFOL;  Surgeon: Arta Silence, MD;  Location: [facility];  Service: Endoscopy;  Laterality: N/A;   colonscopy  2017   DILATION AND CURETTAGE OF UTERUS     few done for endometriosis   Social History   Social History Narrative   Lives alone.     Immunization History  Administered Date(s) Administered   [employer] Vaccination 10/02/2019, 10/30/2019, 05/05/2020, 10/13/2020     Objective: Vital Signs: Ht 4\' 11"  (1.499 m)   Wt 179 lb 3.2 oz (81.3 kg)   BMI 36.19 kg/m    Physical Exam   Musculoskeletal Exam:   CDAI Exam: CDAI Score: -- Patient Global: --; Provider Global: -- Swollen: --; Tender: -- Joint Exam 12/19/2020   No joint exam has been documented for this visit   There is currently no information documented on the homunculus. Go to the Rheumatology activity and complete the homunculus joint exam.  Investigation: No additional findings.  Imaging: XR Hand 2 View Left  Result Date: 12/05/2020 X-ray left hand 2 views Radiocarpal and carpal joints appear normal.  Small cystic change in the base of the first metacarpal.  MCP joint spaces appear normal.  PIP joints appear normal DIP joints show severe joint space loss and large lateral osteophyte formation with possible central erosive change at third digit.  No periosteal reaction is seen.  Bone mineralization appears normal. Impression X-ray demonstrates advanced osteoarthritis change of DIP joints with possible central joint erosion  XR Hand 2  View Right  Result Date: 12/05/2020 X-ray right hand 2 views Radiocarpal joint space appears normal.  There is degenerative change of the first [facility] joint with partial joint subluxation.  MCP joints appear normal.  PIP joints appear normal DIP joints demonstrate extensive joint space loss and lateral osteophyte formation worst at the second and third digits.  Questionable central erosion present.  Bone mineralization appears normal. Impression Osteoarthritis changes of the first [facility] joint and advanced at DIP joints suspicious for early central erosions   Recent Labs: Lab Results  Component Value Date   WBC 7.0 08/18/2020   HGB 12.8 08/18/2020   PLT 171 08/18/2020   NA 145 (H) 08/18/2020   K 3.8 08/18/2020   CL 105 08/18/2020   CO2 21 08/18/2020   GLUCOSE 77 08/18/2020   BUN 9 08/18/2020   CREATININE 0.74 08/18/2020   BILITOT 0.7 06/02/2017   ALKPHOS 111 06/02/2017   AST 42 (H) 06/02/2017   ALT 43 06/02/2017   PROT 7.9 06/02/2017   ALBUMIN 4.0 06/02/2017   CALCIUM 9.5 08/18/2020   GFRAA 99 08/18/2020    Speciality Comments:  No specialty comments available.  Procedures:  No procedures performed Allergies: Amlodipine, Bee venom, Lisinopril, Other, Aspirin, Erythromycin, Hctz [hydrochlorothiazide], and Penicillins   Assessment / Plan:     Visit Diagnoses: No diagnosis found.  Orders: No orders of the defined types were placed in this encounter.  No orders of the defined types were placed in this encounter.   Face-to-face time spent with patient was *** minutes. Greater than 50% of time was spent in counseling and coordination of care.  Follow-Up Instructions: No follow-ups on file.   Ron Agee, RT  Note - This record has been created using [employer].  Chart creation errors have been sought, but may not always  have been located. Such creation errors do not reflect on  the standard of medical care.

## 2020-12-22 ENCOUNTER — Other Ambulatory Visit: Payer: Self-pay | Admitting: Allergy

## 2020-12-26 LAB — LIPID PANEL
Chol/HDL Ratio: 2.9 ratio (ref 0.0–4.4)
Cholesterol, Total: 174 mg/dL (ref 100–199)
HDL: 60 mg/dL (ref >39)
LDL Chol Calc (NIH): 93 mg/dL (ref 0–99)
Triglycerides: 118 mg/dL (ref 0–149)
VLDL Cholesterol Cal: 21 mg/dL (ref 5–40)

## 2020-12-29 ENCOUNTER — Telehealth: Payer: Self-pay | Admitting: *Deleted

## 2020-12-29 DIAGNOSIS — E785 Hyperlipidemia, unspecified: Secondary | ICD-10-CM

## 2020-12-29 MED ORDER — PRAVASTATIN SODIUM 80 MG PO TABS: 80.0000 mg | ORAL_TABLET | Freq: Every day | ORAL | 3 refills | Status: DC

## 2020-12-29 NOTE — Telephone Encounter (Addendum)
-----   Message from Minus Breeding, MD sent at 12/26/2020 12:53 PM EDT ----- She is not quite at target.  Would she agree to take 80 mg of pravastatin and repeat a lipid profile in 3 months.  Call Ms. Michels with the results and send results to Marda Stalker, PA-C  Left message for pt to call

## 2020-12-29 NOTE — Telephone Encounter (Signed)
Spoke with pt, Aware of dr hochrein's recommendations.  New script sent to the pharmacy  Lab orders mailed to the pt

## 2021-01-21 ENCOUNTER — Telehealth: Payer: Self-pay | Admitting: *Deleted

## 2021-01-21 NOTE — Telephone Encounter (Signed)
   [facility] Pre-operative Risk Assessment    Patient Name: Kendra Gilbert  DOB: Dec 27, 1955 MRN: [id number]  HEARTCARE STAFF:  - IMPORTANT!!!!!! Under Visit Info/Reason for Call, type in Other and utilize the format Clearance MM/DD/YY or Clearance TBD. Do not use dashes or single digits. - Please review there is not already an duplicate clearance open for this procedure. - If request is for dental extraction, please clarify the # of teeth to be extracted. - If the patient is currently at the dentist's office, call Pre-Op Callback Staff (MA/nurse) to input urgent request.  - If the patient is not currently in the dentist office, please route to the Pre-Op pool.  Request for surgical clearance:  What type of surgery is being performed? Lt gastroc recession; calcaneal osteotomy  When is this surgery scheduled? TBD  What type of clearance is required (medical clearance vs. Pharmacy clearance to hold med vs. Both)? medical  Are there any medications that need to be held prior to surgery and how long? none  Practice name and name of physician performing surgery? [facility]  What is the office phone number? [phone]   7.   What is the office fax number? [phone]  8.   Anesthesia type (None, local, MAC, general) ? general   Fredia Beets 01/21/2021, 4:58 PM  _________________________________________________________________   (provider comments below)

## 2021-01-26 ENCOUNTER — Other Ambulatory Visit: Payer: Self-pay | Admitting: Allergy

## 2021-01-26 NOTE — Telephone Encounter (Signed)
Left message to call back 9:36 AM on 01/26/2021.  Patient is call back

## 2021-01-26 NOTE — Telephone Encounter (Signed)
Pt returning phone call... please advise  

## 2021-01-28 NOTE — Telephone Encounter (Signed)
Patient was calling back with update. Please advise

## 2021-01-29 ENCOUNTER — Ambulatory Visit: Payer: Medicaid Other | Admitting: Allergy

## 2021-01-29 NOTE — Telephone Encounter (Signed)
Patient is returning call regarding clearance.

## 2021-01-29 NOTE — Telephone Encounter (Signed)
Left voice message 8:05 AM on 01/29/2021.  Patient needs call back.

## 2021-01-30 ENCOUNTER — Other Ambulatory Visit (HOSPITAL_COMMUNITY): Payer: Self-pay | Admitting: Orthopedic Surgery

## 2021-01-30 NOTE — Telephone Encounter (Signed)
    Pt is calling back to f/u pre op clearance, she gave phone # [phone]

## 2021-01-30 NOTE — Telephone Encounter (Signed)
   Primary Cardiologist: Minus Breeding, MD  Chart reviewed as part of pre-operative protocol coverage. Given past medical history and time since last visit, based on ACC/AHA guidelines, Kendra Gilbert would be at acceptable risk for the planned procedure without further cardiovascular testing.   Patient was advised that if she develops new symptoms prior to surgery to contact our office to arrange a follow-up appointment.  She verbalized understanding.  I will route this recommendation to the requesting party via Epic fax function and remove from pre-op pool.  Please call with questions.  Kendra Gilbert. Kendra Hartwell NP-C    01/30/2021, 9:24 AM [address] Office [phone] Fax [phone]

## 2021-02-04 ENCOUNTER — Encounter (HOSPITAL_BASED_OUTPATIENT_CLINIC_OR_DEPARTMENT_OTHER): Payer: Self-pay | Admitting: Orthopedic Surgery

## 2021-02-04 ENCOUNTER — Other Ambulatory Visit: Payer: Self-pay

## 2021-02-05 NOTE — Telephone Encounter (Signed)
Patient states she has an appointment on 9/2, but she would like to know if she should continue to stay off her Xolair injection. Patient states that she has not been as itchy since stopping the Xolair and her asthma has been okay.   Please advise.

## 2021-02-05 NOTE — Telephone Encounter (Signed)
Left a message informing patient that her 03/06/2021 office visit is with Dr. Nelva Bush and at that time Dr. Nelva Bush can address if she should continue to stay off of her Xolair. If she would like to know before we can certainly send Dr. Nelva Bush a message however if she is doing well then maybe we can see if that changes prior to her appointment.

## 2021-02-05 NOTE — Progress Notes (Signed)

## 2021-02-10 NOTE — Telephone Encounter (Signed)
I called and spoke with patient and she stated that she is having joint pain but she believes that to be from the arthritis. She did state that now that she is off of Xolair she is no longer itching. She stated that when she is outside in the heat and humidity her wheezing and coughing gets worse but she said it was the same thing when she was on Xolair. She states that when she is in the air conditioning her breathing is much better. She is continuing all of her medications as prescribed.

## 2021-02-11 NOTE — Telephone Encounter (Signed)
Called patient and left a detailed voicemail advising.

## 2021-02-12 ENCOUNTER — Ambulatory Visit (HOSPITAL_BASED_OUTPATIENT_CLINIC_OR_DEPARTMENT_OTHER): Payer: Medicare Other | Admitting: Certified Registered"

## 2021-02-12 ENCOUNTER — Encounter (HOSPITAL_BASED_OUTPATIENT_CLINIC_OR_DEPARTMENT_OTHER): Payer: Self-pay | Admitting: Orthopedic Surgery

## 2021-02-12 ENCOUNTER — Encounter (HOSPITAL_BASED_OUTPATIENT_CLINIC_OR_DEPARTMENT_OTHER)
Admission: RE | Disposition: A | Discharge status: Home / Self Care | Payer: Self-pay | Source: Home / Self Care | Attending: Orthopedic Surgery

## 2021-02-12 ENCOUNTER — Other Ambulatory Visit: Payer: Self-pay

## 2021-02-12 ENCOUNTER — Ambulatory Visit (HOSPITAL_BASED_OUTPATIENT_CLINIC_OR_DEPARTMENT_OTHER)
Admission: RE | Admit: 2021-02-12 | Discharge: 2021-02-12 | Disposition: A | Discharge status: Home / Self Care | Payer: Medicare Other | Attending: Orthopedic Surgery | Admitting: Orthopedic Surgery

## 2021-02-12 DIAGNOSIS — Z886 Allergy status to analgesic agent status: Secondary | ICD-10-CM | POA: Insufficient documentation

## 2021-02-12 DIAGNOSIS — Z880 Allergy status to penicillin: Secondary | ICD-10-CM | POA: Insufficient documentation

## 2021-02-12 DIAGNOSIS — M76822 Posterior tibial tendinitis, left leg: Secondary | ICD-10-CM | POA: Diagnosis not present

## 2021-02-12 DIAGNOSIS — Z85038 Personal history of other malignant neoplasm of large intestine: Secondary | ICD-10-CM | POA: Diagnosis not present

## 2021-02-12 DIAGNOSIS — Z79899 Other long term (current) drug therapy: Secondary | ICD-10-CM | POA: Diagnosis not present

## 2021-02-12 DIAGNOSIS — M069 Rheumatoid arthritis, unspecified: Secondary | ICD-10-CM | POA: Insufficient documentation

## 2021-02-12 DIAGNOSIS — Z91030 Bee allergy status: Secondary | ICD-10-CM | POA: Diagnosis not present

## 2021-02-12 DIAGNOSIS — Z8249 Family history of ischemic heart disease and other diseases of the circulatory system: Secondary | ICD-10-CM | POA: Insufficient documentation

## 2021-02-12 DIAGNOSIS — Z888 Allergy status to other drugs, medicaments and biological substances status: Secondary | ICD-10-CM | POA: Diagnosis not present

## 2021-02-12 DIAGNOSIS — Z841 Family history of disorders of kidney and ureter: Secondary | ICD-10-CM | POA: Insufficient documentation

## 2021-02-12 DIAGNOSIS — Z881 Allergy status to other antibiotic agents status: Secondary | ICD-10-CM | POA: Diagnosis not present

## 2021-02-12 DIAGNOSIS — Z7951 Long term (current) use of inhaled steroids: Secondary | ICD-10-CM | POA: Insufficient documentation

## 2021-02-12 DIAGNOSIS — N189 Chronic kidney disease, unspecified: Secondary | ICD-10-CM | POA: Insufficient documentation

## 2021-02-12 DIAGNOSIS — I129 Hypertensive chronic kidney disease with stage 1 through stage 4 chronic kidney disease, or unspecified chronic kidney disease: Secondary | ICD-10-CM | POA: Insufficient documentation

## 2021-02-12 DIAGNOSIS — Z825 Family history of asthma and other chronic lower respiratory diseases: Secondary | ICD-10-CM | POA: Diagnosis not present

## 2021-02-12 DIAGNOSIS — Z803 Family history of malignant neoplasm of breast: Secondary | ICD-10-CM | POA: Insufficient documentation

## 2021-02-12 DIAGNOSIS — M6702 Short Achilles tendon (acquired), left ankle: Secondary | ICD-10-CM | POA: Insufficient documentation

## 2021-02-12 HISTORY — PX: GASTROC RECESSION EXTREMITY: SHX6262

## 2021-02-12 HISTORY — PX: ANKLE SURGERY: SHX546

## 2021-02-12 SURGERY — RECESSION, TENDON, GASTROCNEMIUS
Anesthesia: General | Site: Leg Lower | Laterality: Left

## 2021-02-12 MED ORDER — RIVAROXABAN 10 MG PO TABS: 10.0000 mg | ORAL_TABLET | Freq: Every day | ORAL | 0 refills | Status: DC

## 2021-02-12 MED ORDER — PROPOFOL 10 MG/ML IV BOLUS
INTRAVENOUS | Status: DC | PRN
  Administered 2021-02-12: 140 mg via INTRAVENOUS

## 2021-02-12 MED ORDER — VANCOMYCIN HCL 500 MG IV SOLR
INTRAVENOUS | Status: AC
  Filled 2021-02-12: qty 500

## 2021-02-12 MED ORDER — OXYCODONE HCL 5 MG PO TABS: 5.0000 mg | ORAL_TABLET | ORAL | 0 refills | Status: AC | PRN

## 2021-02-12 MED ORDER — LACTATED RINGERS IV SOLN: INTRAVENOUS | Status: DC

## 2021-02-12 MED ORDER — FENTANYL CITRATE (PF) 100 MCG/2ML IJ SOLN
50.0000 ug | Freq: Once | INTRAMUSCULAR | Status: AC
  Administered 2021-02-12: 50 ug via INTRAVENOUS

## 2021-02-12 MED ORDER — ACETAMINOPHEN 500 MG PO TABS
ORAL_TABLET | ORAL | Status: AC
  Filled 2021-02-12: qty 2

## 2021-02-12 MED ORDER — VANCOMYCIN HCL 500 MG IV SOLR
INTRAVENOUS | Status: DC | PRN
  Administered 2021-02-12: 500 mg via TOPICAL

## 2021-02-12 MED ORDER — FENTANYL CITRATE (PF) 100 MCG/2ML IJ SOLN
INTRAMUSCULAR | Status: AC
  Filled 2021-02-12: qty 2

## 2021-02-12 MED ORDER — OXYCODONE HCL 5 MG PO TABS: 5.0000 mg | ORAL_TABLET | Freq: Once | ORAL | Status: DC | PRN

## 2021-02-12 MED ORDER — CEFAZOLIN SODIUM-DEXTROSE 2-4 GM/100ML-% IV SOLN
INTRAVENOUS | Status: AC
  Filled 2021-02-12: qty 100

## 2021-02-12 MED ORDER — SODIUM CHLORIDE 0.9 % IV SOLN: INTRAVENOUS | Status: DC

## 2021-02-12 MED ORDER — OXYCODONE HCL 5 MG/5ML PO SOLN
5.0000 mg | Freq: Once | ORAL | Status: DC | PRN
Start: 2021-02-12 — End: 2021-02-12

## 2021-02-12 MED ORDER — DOCUSATE SODIUM 100 MG PO CAPS: 100.0000 mg | ORAL_CAPSULE | Freq: Two times a day (BID) | ORAL | 0 refills | Status: DC

## 2021-02-12 MED ORDER — 0.9 % SODIUM CHLORIDE (POUR BTL) OPTIME
TOPICAL | Status: DC | PRN
  Administered 2021-02-12: 200 mL

## 2021-02-12 MED ORDER — PHENYLEPHRINE HCL (PRESSORS) 10 MG/ML IV SOLN
INTRAVENOUS | Status: DC | PRN
  Administered 2021-02-12 (×2): 40 ug via INTRAVENOUS
  Administered 2021-02-12: 80 ug via INTRAVENOUS

## 2021-02-12 MED ORDER — CEFAZOLIN SODIUM-DEXTROSE 2-4 GM/100ML-% IV SOLN
2.0000 g | INTRAVENOUS | Status: AC
  Administered 2021-02-12: 2 g via INTRAVENOUS

## 2021-02-12 MED ORDER — FENTANYL CITRATE (PF) 100 MCG/2ML IJ SOLN
25.0000 ug | INTRAMUSCULAR | Status: DC | PRN
  Administered 2021-02-12: 50 ug via INTRAVENOUS

## 2021-02-12 MED ORDER — ROPIVACAINE HCL 5 MG/ML IJ SOLN
INTRAMUSCULAR | Status: DC | PRN
  Administered 2021-02-12: 30 mL via PERINEURAL

## 2021-02-12 MED ORDER — SENNA 8.6 MG PO TABS: 2.0000 | ORAL_TABLET | Freq: Two times a day (BID) | ORAL | 0 refills | Status: DC

## 2021-02-12 MED ORDER — ACETAMINOPHEN 500 MG PO TABS
1000.0000 mg | ORAL_TABLET | Freq: Once | ORAL | Status: AC
  Administered 2021-02-12: 1000 mg via ORAL

## 2021-02-12 MED ORDER — MIDAZOLAM HCL 2 MG/2ML IJ SOLN
INTRAMUSCULAR | Status: AC
  Filled 2021-02-12: qty 2

## 2021-02-12 MED ORDER — BUPIVACAINE-EPINEPHRINE (PF) 0.5% -1:200000 IJ SOLN
INTRAMUSCULAR | Status: AC
  Filled 2021-02-12: qty 30

## 2021-02-12 MED ORDER — PROPOFOL 500 MG/50ML IV EMUL
INTRAVENOUS | Status: DC | PRN
  Administered 2021-02-12: 25 ug/kg/min via INTRAVENOUS

## 2021-02-12 MED ORDER — DEXAMETHASONE SODIUM PHOSPHATE 10 MG/ML IJ SOLN
INTRAMUSCULAR | Status: DC | PRN
  Administered 2021-02-12: 4 mg via INTRAVENOUS

## 2021-02-12 MED ORDER — MIDAZOLAM HCL 2 MG/2ML IJ SOLN
1.0000 mg | Freq: Once | INTRAMUSCULAR | Status: AC
  Administered 2021-02-12: 1 mg via INTRAVENOUS

## 2021-02-12 MED ORDER — AMISULPRIDE (ANTIEMETIC) 5 MG/2ML IV SOLN: 10.0000 mg | Freq: Once | INTRAVENOUS | Status: DC | PRN

## 2021-02-12 MED ORDER — LIDOCAINE HCL (CARDIAC) PF 100 MG/5ML IV SOSY
PREFILLED_SYRINGE | INTRAVENOUS | Status: DC | PRN
  Administered 2021-02-12: 30 mg via INTRAVENOUS

## 2021-02-12 MED ORDER — ONDANSETRON HCL 4 MG/2ML IJ SOLN
INTRAMUSCULAR | Status: DC | PRN
  Administered 2021-02-12: 4 mg via INTRAVENOUS

## 2021-02-12 MED ORDER — EPHEDRINE SULFATE 50 MG/ML IJ SOLN
INTRAMUSCULAR | Status: DC | PRN
  Administered 2021-02-12: 5 mg via INTRAVENOUS
  Administered 2021-02-12: 10 mg via INTRAVENOUS

## 2021-02-12 MED ORDER — PROMETHAZINE HCL 25 MG/ML IJ SOLN: 6.2500 mg | INTRAMUSCULAR | Status: DC | PRN

## 2021-02-12 SURGICAL SUPPLY — 81 items
APL PRP STRL LF DISP 70% ISPRP (MISCELLANEOUS) ×3
BANDAGE ESMARK 6X9 LF (GAUZE/BANDAGES/DRESSINGS) IMPLANT
BLADE AVERAGE 25X9 (BLADE) IMPLANT
BLADE MICRO SAGITTAL (BLADE) IMPLANT
BLADE SURG 15 STRL LF DISP TIS (BLADE) ×12 IMPLANT
BLADE SURG 15 STRL SS (BLADE) ×16
BNDG CMPR 9X6 STRL LF SNTH (GAUZE/BANDAGES/DRESSINGS)
BNDG COHESIVE 4X5 TAN ST LF (GAUZE/BANDAGES/DRESSINGS) ×4 IMPLANT
BNDG COHESIVE 6X5 TAN ST LF (GAUZE/BANDAGES/DRESSINGS) ×4 IMPLANT
BNDG ESMARK 6X9 LF (GAUZE/BANDAGES/DRESSINGS)
CANISTER SUCT 1200ML W/VALVE (MISCELLANEOUS) ×4 IMPLANT
CHLORAPREP W/TINT 26 (MISCELLANEOUS) ×4 IMPLANT
COVER BACK TABLE 60X90IN (DRAPES) ×4 IMPLANT
CUFF TOURN SGL QUICK 34 (TOURNIQUET CUFF) ×4
CUFF TRNQT CYL 34X4.125X (TOURNIQUET CUFF) ×3 IMPLANT
DECANTER SPIKE VIAL GLASS SM (MISCELLANEOUS) IMPLANT
DRAPE EXTREMITY T 121X128X90 (DISPOSABLE) ×4 IMPLANT
DRAPE OEC MINIVIEW 54X84 (DRAPES) ×4 IMPLANT
DRAPE U-SHAPE 47X51 STRL (DRAPES) ×4 IMPLANT
DRSG MEPITEL 4X7.2 (GAUZE/BANDAGES/DRESSINGS) ×4 IMPLANT
DRSG PAD ABDOMINAL 8X10 ST (GAUZE/BANDAGES/DRESSINGS) ×8 IMPLANT
ELECT REM PT RETURN 9FT ADLT (ELECTROSURGICAL) ×4
ELECTRODE REM PT RTRN 9FT ADLT (ELECTROSURGICAL) ×3 IMPLANT
GAUZE SPONGE 4X4 12PLY STRL (GAUZE/BANDAGES/DRESSINGS) ×4 IMPLANT
GLOVE SRG 8 PF TXTR STRL LF DI (GLOVE) ×6 IMPLANT
GLOVE SURG ENC MOIS LTX SZ8 (GLOVE) ×4 IMPLANT
GLOVE SURG LTX SZ8 (GLOVE) ×4 IMPLANT
GLOVE SURG POLYISO LF SZ7 (GLOVE) ×8 IMPLANT
GLOVE SURG UNDER POLY LF SZ7 (GLOVE) ×12 IMPLANT
GLOVE SURG UNDER POLY LF SZ8 (GLOVE) ×8
GOWN STRL REUS W/ TWL LRG LVL3 (GOWN DISPOSABLE) ×3 IMPLANT
GOWN STRL REUS W/ TWL XL LVL3 (GOWN DISPOSABLE) ×6 IMPLANT
GOWN STRL REUS W/TWL LRG LVL3 (GOWN DISPOSABLE) ×4
GOWN STRL REUS W/TWL XL LVL3 (GOWN DISPOSABLE) ×8
KIT ACCESSORY DRILL 5 (KITS) ×4 IMPLANT
NDL SUT 6 .5 CRC .975X.05 MAYO (NEEDLE) IMPLANT
NEEDLE HYPO 22GX1.5 SAFETY (NEEDLE) IMPLANT
NEEDLE MAYO TAPER (NEEDLE)
NS IRRIG 1000ML POUR BTL (IV SOLUTION) ×4 IMPLANT
PACK BASIN DAY SURGERY FS (CUSTOM PROCEDURE TRAY) ×4 IMPLANT
PAD CAST 4YDX4 CTTN HI CHSV (CAST SUPPLIES) ×3 IMPLANT
PADDING CAST ABS 4INX4YD NS (CAST SUPPLIES)
PADDING CAST ABS COTTON 4X4 ST (CAST SUPPLIES) IMPLANT
PADDING CAST COTTON 4X4 STRL (CAST SUPPLIES) ×4
PADDING CAST COTTON 6X4 STRL (CAST SUPPLIES) ×4 IMPLANT
PASSER SUT SWANSON 36MM LOOP (INSTRUMENTS) IMPLANT
PENCIL SMOKE EVACUATOR (MISCELLANEOUS) ×4 IMPLANT
RETRIEVER SUT HEWSON (MISCELLANEOUS) IMPLANT
SANITIZER HAND PURELL 535ML FO (MISCELLANEOUS) ×4 IMPLANT
SCREW PEEK TENODESIS 6X12MM (Screw) ×4 IMPLANT
SHEET MEDIUM DRAPE 40X70 STRL (DRAPES) ×4 IMPLANT
SLEEVE SCD COMPRESS KNEE MED (STOCKING) ×4 IMPLANT
SPLINT FAST PLASTER 5X30 (CAST SUPPLIES) ×20
SPLINT PLASTER CAST FAST 5X30 (CAST SUPPLIES) ×60 IMPLANT
SPONGE T-LAP 18X18 ~~LOC~~+RFID (SPONGE) ×4 IMPLANT
STOCKINETTE 6  STRL (DRAPES) ×1
STOCKINETTE 6 STRL (DRAPES) ×3 IMPLANT
SUCTION FRAZIER HANDLE 10FR (MISCELLANEOUS) ×1
SUCTION TUBE FRAZIER 10FR DISP (MISCELLANEOUS) ×3 IMPLANT
SUT BONE WAX W31G (SUTURE) IMPLANT
SUT ETHIBOND 0 MO6 C/R (SUTURE) IMPLANT
SUT ETHIBOND 2 OS 4 DA (SUTURE) IMPLANT
SUT ETHILON 3 0 PS 1 (SUTURE) ×8 IMPLANT
SUT FIBERWIRE #2 38 T-5 BLUE (SUTURE)
SUT FIBERWIRE 2-0 18 17.9 3/8 (SUTURE)
SUT MERSILENE 2.0 SH NDLE (SUTURE) IMPLANT
SUT MNCRL AB 3-0 PS2 18 (SUTURE) ×4 IMPLANT
SUT VIC AB 0 SH 27 (SUTURE) ×4 IMPLANT
SUT VIC AB 2-0 SH 18 (SUTURE) IMPLANT
SUT VIC AB 2-0 SH 27 (SUTURE) ×4
SUT VIC AB 2-0 SH 27XBRD (SUTURE) ×3 IMPLANT
SUT VICRYL 0 UR6 27IN ABS (SUTURE) IMPLANT
SUTURE FIBERWR #2 38 T-5 BLUE (SUTURE) IMPLANT
SUTURE FIBERWR 2-0 18 17.9 3/8 (SUTURE) IMPLANT
SUTURE TAPE 1.3 FIBERLOP 20 ST (SUTURE) IMPLANT
SUTURETAPE 1.3 FIBERLOOP 20 ST (SUTURE)
SYR BULB EAR ULCER 3OZ GRN STR (SYRINGE) ×4 IMPLANT
SYR CONTROL 10ML LL (SYRINGE) IMPLANT
TOWEL GREEN STERILE FF (TOWEL DISPOSABLE) ×8 IMPLANT
TUBE CONNECTING 20X1/4 (TUBING) ×4 IMPLANT
UNDERPAD 30X36 HEAVY ABSORB (UNDERPADS AND DIAPERS) ×4 IMPLANT

## 2021-02-12 NOTE — Anesthesia Preprocedure Evaluation (Addendum)
Anesthesia Evaluation  Patient identified by MRN, date of birth, ID band Patient awake    Reviewed: Allergy & Precautions, NPO status , Patient's Chart, lab work & pertinent test results  Airway Mallampati: II  TM Distance: >3 FB Neck ROM: Full    Dental no notable dental hx.    Pulmonary shortness of breath, asthma (inhaler) , sleep apnea ,    Pulmonary exam normal breath sounds clear to auscultation       Cardiovascular hypertension, Pt. on medications negative cardio ROS Normal cardiovascular exam Rhythm:Regular Rate:Normal     Neuro/Psych  Headaches, negative psych ROS   GI/Hepatic Neg liver ROS, GERD  ,  Endo/Other  negative endocrine ROS  Renal/GU Renal InsufficiencyRenal disease  negative genitourinary   Musculoskeletal  (+) Arthritis , Rheumatoid disorders,  Fibromyalgia -  Abdominal   Peds negative pediatric ROS (+)  Hematology negative hematology ROS (+)   Anesthesia Other Findings   Reproductive/Obstetrics negative OB ROS                            Anesthesia Physical Anesthesia Plan  ASA: 2  Anesthesia Plan: General   Post-op Pain Management: GA combined w/ Regional for post-op pain   Induction: Intravenous  PONV Risk Score and Plan: 3 and Treatment may vary due to age or medical condition, Midazolam, Ondansetron and Dexamethasone  Airway Management Planned: LMA  Additional Equipment:   Intra-op Plan:   Post-operative Plan: Extubation in OR  Informed Consent: I have reviewed the patients History and Physical, chart, labs and discussed the procedure including the risks, benefits and alternatives for the proposed anesthesia with the patient or authorized representative who has indicated his/her understanding and acceptance.     Dental advisory given  Plan Discussed with: CRNA and Anesthesiologist  Anesthesia Plan Comments: (GA/LMA. Popliteal block for postop  pain control. )        Anesthesia Quick Evaluation

## 2021-02-12 NOTE — Anesthesia Postprocedure Evaluation (Signed)
Anesthesia Post Note  Patient: Kendra Gilbert  Procedure(s) Performed: Left gastroc recession (Left: Leg Lower) Posterior Tibial Tenolysis; Flexor Digitorum Longus transfer to Navicular (Left: Ankle)     Patient location during evaluation: PACU Anesthesia Type: General and Regional Level of consciousness: awake Pain management: pain level controlled Vital Signs Assessment: post-procedure vital signs reviewed and stable Respiratory status: spontaneous breathing and respiratory function stable Cardiovascular status: stable Postop Assessment: no apparent nausea or vomiting Anesthetic complications: no   No notable events documented.  Last Vitals:  Vitals:   02/12/21 1206 02/12/21 1215  BP:    Pulse:  68  Resp:  15  Temp:    SpO2: 94% 94%    Last Pain:  Vitals:   02/12/21 1215  TempSrc:   PainSc: 0-No pain                 Merlinda Frederick

## 2021-02-12 NOTE — Op Note (Signed)
02/12/2021  11:46 AM  PATIENT:  Kendra Gilbert  65 y.o. female  PRE-OPERATIVE DIAGNOSIS: 1.  Left posterior tibial tendon dysfunction 2.  Short left Achilles tendon  POST-OPERATIVE DIAGNOSIS: Same  Procedure(s): 1.  Left gastroc recession 2.  Left posterior Tibial Tenolysis 3.  Left Flexor Digitorum Longus transfer to Navicular  SURGEON:  Wylene Simmer, MD  ASSISTANT: Mechele Claude, PA-C  ANESTHESIA:   General, regional  EBL:  minimal   TOURNIQUET:   Total Tourniquet Time Documented: Thigh (Left) - 32 minutes Total: Thigh (Left) - 32 minutes  COMPLICATIONS:  None apparent  DISPOSITION:  Extubated, awake and stable to recovery.  INDICATION FOR PROCEDURE: The patient is a 65 year old female without significant past medical history.  She has a long history of left medial ankle and hindfoot pain due to posterior tibial tendinitis.  She also has a tight heel cord.  She has failed nonoperative treatment and presents today for surgical correction of this painful and limiting left ankle and hindfoot condition.  The risks and benefits of the alternative treatment options have been discussed in detail.  The patient wishes to proceed with surgery and specifically understands risks of bleeding, infection, nerve damage, blood clots, need for additional surgery, amputation and death.   PROCEDURE IN DETAIL:  After pre operative consent was obtained, and the correct operative site was identified, the patient was brought to the operating room and placed supine on the OR table.  Anesthesia was administered.  Pre-operative antibiotics were administered.  A surgical timeout was taken.  The left lower extremity was prepped and draped in standard sterile fashion with a tourniquet around the thigh.  The extremity was elevated and the tourniquet was inflated to 250 mmHg.  A longitudinal incision was made over the medial calf.  Dissection was carried sharply down through the subcutaneous tissues.   Superficial fascia was incised.  The gastrocnemius tendon was identified.  The sural nerve was protected.  The plantaris and gastrocnemius tendons were then divided in their entirety under direct vision.  The wound was irrigated copiously.  The ankle was able to be dorsiflexed 20 degrees with the knee extended.  Vancomycin powder was sprinkled in the wound.  The incision was closed with Monocryl and nylon.  Attention was turned to the foot.  The patient had no significant hindfoot valgus.  The decision was made to defer the calcaneus osteotomy.  Attention was turned to the medial aspect of the ankle and hindfoot.  An incision was made over the posterior tibial tendon.  Dissection was carried through the skin and subcutaneous tissues and down through the tendon sheath.  Careful tenolysis was then performed along the posterior tibial tendon removing all synovitis with a scalpel.  There was no evidence of tear.  The spring ligament was noted to be intact.  The flexor digitorum longus tendon was identified.  It was dissected distally to the knot of Henry and transected.  A 0 Vicryl stitch was placed in the end of the tendon.  A 5 mm hole was then drilled in the tuberosity of the navicular.  The tendon was pulled up from plantar to dorsal and fixed with a 6 mm [employer] bolt.  The tendon was then further repaired to the adjacent periosteum with figure-of-eight sutures of 0 Vicryl.  Final AP and lateral radiographs confirmed appropriate position of the drill hole in the navicular and appropriate coverage of the talar head by the navicular.  The medial wound was then irrigated copiously and  sprinkled with vancomycin powder.  The subcutaneous tissues were approximated with 2-0 Vicryl.  The skin incision was closed with 3-0 nylon.  Sterile dressings were applied followed by a well-padded short leg splint.  Tourniquet was released after application of the dressings.  The patient was awakened from anesthesia and  transported to the recovery room in stable condition.   FOLLOW UP PLAN: Nonweightbearing on the left lower extremity in a short leg splint.  Follow-up in the office in 2 weeks for suture removal and conversion to a short leg cast.  Xarelto for DVT prophylaxis.  Plan 6 weeks postoperative nonweightbearing immobilization.   RADIOGRAPHS: AP and lateral radiographs of the left foot are obtained intraoperatively.  These show appropriate position of the drill hole in the navicular tuberosity and appropriate correction of the arch alignment.  No other acute injuries are noted.    Mechele Claude PA-C was present and scrubbed for the duration of the operative case. His assistance was essential in positioning the patient, prepping and draping, gaining and maintaining exposure, performing the operation, closing and dressing the wounds and applying the splint.

## 2021-02-12 NOTE — Transfer of Care (Signed)
Immediate Anesthesia Transfer of Care Note  Patient: Kendra Gilbert  Procedure(s) Performed: Left gastroc recession (Left: Leg Lower) Posterior Tibial Tenolysis; Flexor Digitorum Longus transfer to Navicular (Left: Ankle)  Patient Location: PACU  Anesthesia Type:GA combined with regional for post-op pain  Level of Consciousness: awake, alert , oriented and patient cooperative  Airway & Oxygen Therapy: Patient Spontanous Breathing and Patient connected to face mask oxygen  Post-op Assessment: Report given to RN and Post -op Vital signs reviewed and stable  Post vital signs: Reviewed and stable  Last Vitals:  Vitals Value Taken Time  BP 141/67 02/12/21 1145  Temp    Pulse 62 02/12/21 1147  Resp 16 02/12/21 1147  SpO2 100 % 02/12/21 1147  Vitals shown include unvalidated device data.  Last Pain:  Vitals:   02/12/21 0803  TempSrc: Oral  PainSc: 0-No pain         Complications: No notable events documented.

## 2021-02-12 NOTE — Discharge Instructions (Addendum)
Wylene Simmer, MD [facility]  Please read the following information regarding your care after surgery.  Medications  You only need a prescription for the narcotic pain medicine (ex. oxycodone, Percocet, Norco).  All of the other medicines listed below are available over the counter. X Aleve 2 pills twice a day for the first 3 days after surgery. X acetominophen (Tylenol) 650 mg every 4-6 hours as you need for minor to moderate pain X oxycodone as prescribed for severe pain  Narcotic pain medicine (ex. oxycodone, Percocet, Vicodin) will cause constipation.  To prevent this problem, take the following medicines while you are taking any pain medicine. X docusate sodium (Colace) 100 mg twice a day X senna (Senokot) 2 tablets twice a day  X To help prevent blood clots, take Xarelto as prescribed for two weeks after surgery.  You should also get up every hour while you are awake to move around.    Weight Bearing X Do not bear any weight on the operated leg or foot.  Cast / Splint / Dressing X Keep your splint, cast or dressing clean and dry.  Don't put anything (coat hanger, pencil, etc) down inside of it.  If it gets damp, use a hair dryer on the cool setting to dry it.  If it gets soaked, call the office to schedule an appointment for a cast change.   After your dressing, cast or splint is removed; you may shower, but do not soak or scrub the wound.  Allow the water to run over it, and then gently pat it dry.  Swelling It is normal for you to have swelling where you had surgery.  To reduce swelling and pain, keep your toes above your nose for at least 3 days after surgery.  It may be necessary to keep your foot or leg elevated for several weeks.  If it hurts, it should be elevated.  Follow Up Call my office at [phone] when you are discharged from the hospital or surgery center to schedule an appointment to be seen two weeks after surgery.  Call my office at [phone] if you develop  a fever >101.5 F, nausea, vomiting, bleeding from the surgical site or severe pain.        Post Anesthesia Home Care Instructions  Activity: Get plenty of rest for the remainder of the day. A responsible individual must stay with you for 24 hours following the procedure.  For the next 24 hours, DO NOT: -Drive a car -Paediatric nurse -Drink alcoholic beverages -Take any medication unless instructed by your physician -Make any legal decisions or sign important papers.  Meals: Start with liquid foods such as gelatin or soup. Progress to regular foods as tolerated. Avoid greasy, spicy, heavy foods. If nausea and/or vomiting occur, drink only clear liquids until the nausea and/or vomiting subsides. Call your physician if vomiting continues.  Special Instructions/Symptoms: Your throat may feel dry or sore from the anesthesia or the breathing tube placed in your throat during surgery. If this causes discomfort, gargle with warm salt water. The discomfort should disappear within 24 hours.  If you had a scopolamine patch placed behind your ear for the management of post- operative nausea and/or vomiting:  1. The medication in the patch is effective for 72 hours, after which it should be removed.  Wrap patch in a tissue and discard in the trash. Wash hands thoroughly with soap and water. 2. You may remove the patch earlier than 72 hours if you experience unpleasant side  effects which may include dry mouth, dizziness or visual disturbances. 3. Avoid touching the patch. Wash your hands with soap and water after contact with the patch.        Regional Anesthesia Blocks  1. Numbness or the inability to move the "blocked" extremity may last from 3-48 hours after placement. The length of time depends on the medication injected and your individual response to the medication. If the numbness is not going away after 48 hours, call your surgeon.  2. The extremity that is blocked will need to be  protected until the numbness is gone and the  Strength has returned. Because you cannot feel it, you will need to take extra care to avoid injury. Because it may be weak, you may have difficulty moving it or using it. You may not know what position it is in without looking at it while the block is in effect.  3. For blocks in the legs and feet, returning to weight bearing and walking needs to be done carefully. You will need to wait until the numbness is entirely gone and the strength has returned. You should be able to move your leg and foot normally before you try and bear weight or walk. You will need someone to be with you when you first try to ensure you do not fall and possibly risk injury.  4. Bruising and tenderness at the needle site are common side effects and will resolve in a few days.  5. Persistent numbness or new problems with movement should be communicated to the surgeon or the [place] [phone] [place] [phone]).        Next dose of Tylenol can be given after 2:10 PM.

## 2021-02-12 NOTE — Anesthesia Procedure Notes (Addendum)
Anesthesia Regional Block: Popliteal block   Pre-Anesthetic Checklist: , timeout performed,  Correct Patient, Correct Site, Correct Laterality,  Correct Procedure, Correct Position, site marked,  Risks and benefits discussed,  Surgical consent,  Pre-op evaluation,  At surgeon's request and post-op pain management  Laterality: Left  Prep: chloraprep       Needles:  Injection technique: Single-shot  Needle Type: Echogenic Stimulator Needle     Needle Length: 10cm  Needle Gauge: 20     Additional Needles:   Procedures:,,,, ultrasound used (permanent image in chart),,    Narrative:  Start time: 02/12/2021 9:10 AM End time: 02/12/2021 9:15 AM Injection made incrementally with aspirations every 5 mL.  Performed by: Personally  Anesthesiologist: Merlinda Frederick, MD  Additional Notes: A functioning IV was confirmed and monitors were applied.  Sterile prep and drape, hand hygiene and sterile gloves were used.  Negative aspiration and test dose prior to incremental administration of local anesthetic. The patient tolerated the procedure well.Ultrasound  guidance: relevant anatomy identified, needle position confirmed, local anesthetic spread visualized around nerve(s), vascular puncture avoided.  Image printed for medical record.

## 2021-02-12 NOTE — Progress Notes (Signed)
Assisted Dr. Elgie Congo with left, ultrasound guided, popliteal block. Side rails up, monitors on throughout procedure. See vital signs in flow sheet. Tolerated Procedure well.

## 2021-02-12 NOTE — Anesthesia Procedure Notes (Signed)
Procedure Name: LMA Insertion Date/Time: 02/12/2021 10:52 AM Performed by: Signe Colt, CRNA Pre-anesthesia Checklist: Patient identified, Emergency Drugs available, Suction available and Patient being monitored Patient Re-evaluated:Patient Re-evaluated prior to induction Oxygen Delivery Method: Circle System Utilized Preoxygenation: Pre-oxygenation with 100% oxygen Induction Type: IV induction Ventilation: Mask ventilation without difficulty LMA: LMA inserted LMA Size: 4.0 Number of attempts: 1 Airway Equipment and Method: bite block Placement Confirmation: positive ETCO2 Tube secured with: Tape Dental Injury: Teeth and Oropharynx as per pre-operative assessment

## 2021-02-12 NOTE — H&P (Signed)
Kendra Gilbert is an 65 y.o. female.   Chief Complaint: Left ankle pain HPI: The patient is a 65 year old woman without significant past medical history.  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  She has failed nonoperative treatment to date including activity modification, oral anti-inflammatories, bracing and physical therapy.  She presents today for surgical treatment of this painful and limiting left ankle condition.  Past Medical History:  Diagnosis Date   Asthma    Chronic kidney disease    cyst in left kidney   Colon cancer ([place]) 2017   surgical tx  removal of polyp   Coronary artery calcification    a. POET in 2015 with no acute findings.   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   no current issues with   Pneumonia 2016   Rheumatoid arthritis ([place])    oa also, back arthritis, hx rheumatoid     Past Surgical History:  Procedure Laterality Date   ABDOMINAL HYSTERECTOMY     ovaries left   COLONOSCOPY WITH PROPOFOL N/A 03/23/2017   Procedure: COLONOSCOPY WITH PROPOFOL;  Surgeon: Arta Silence, MD;  Location: [facility];  Service: Endoscopy;  Laterality: N/A;   colonscopy  2017   DILATION AND CURETTAGE OF UTERUS     few done for endometriosis    Family History  Problem Relation Age of Onset   CAD Mother 14       CABG/stents   Breast cancer Mother    Kidney failure Mother    Asthma Father    CAD Father 77   Allergies Father    CAD Brother    CAD Brother 36       9 stents   CAD Brother 13       7 stents   Heart attack Brother    Social History:  reports that she has never smoked. She has  never used smokeless tobacco. She reports that she does not drink alcohol and does not use drugs.  Allergies:  Allergies  Allergen Reactions   Amlodipine Shortness Of Breath and Swelling   Bee Venom Anaphylaxis and Hives   Lisinopril Itching and Cough    Dry cough and severe itching in the throat    Other Other (See Comments)    Mosquito bites cause hives all over body   Aspirin Other (See Comments)    Causes stomach problems   Erythromycin Other (See Comments)    Ate the lining of her stomach   Hctz [Hydrochlorothiazide]     Rash    Penicillins Rash    Has patient had a PCN reaction causing immediate rash, facial/tongue/throat swelling, SOB or lightheadedness with hypotension: Yes Has patient had a PCN reaction causing severe rash involving mucus membranes or skin necrosis: Yes Has patient had a PCN reaction that required hospitalization: No  Has patient had a PCN reaction occurring within the last 10 years: No If all of the above answers are "NO", then may proceed with Cephalosporin use.     Facility-Administered Medications Prior to Admission  Medication  Dose Route Frequency Provider Last Rate Last Admin   omalizumab Arvid Right) injection 375 mg  375 mg Subcutaneous Q14 Days Kennith Gain, MD   375 mg at 11/24/20 0830   Medications Prior to Admission  Medication Sig Dispense Refill   albuterol (PROVENTIL HFA) 108 (90 Base) MCG/ACT inhaler Inhale 2 puffs into the lungs every 4 (four) hours as needed for wheezing or shortness of breath. 18 g 2   azelastine (ASTELIN) 0.1 % nasal spray Use 2 sprays in each nostrils twice daily 30 mL 3   budesonide-formoterol (SYMBICORT) 160-4.5 MCG/ACT inhaler INHALE 2 PUFFS INTO THE LUNGS TWICE DAILY 10.2 g 0   diphenhydrAMINE (BENADRYL) 25 MG tablet Take 25 mg by mouth every 6 (six) hours as needed.     fluticasone (FLOVENT HFA) 110 MCG/ACT inhaler Inhale two puffs twice daily if needed to prevent cough or wheeze.  Rinse, gargle, and  spit after use. 12 g 5   ipratropium-albuterol (DUONEB) 0.5-2.5 (3) MG/3ML SOLN Use 1 vial via nebulizer every 4-6 hours as needed for cough, wheeze, shortness of breath or chest tightness 180 mL 1   levocetirizine (XYZAL) 5 MG tablet Xyzal 5 mg tablet  Take 1 tablet every day by oral route.     losartan (COZAAR) 25 MG tablet Take 1 tablet (25 mg total) by mouth in the morning and at bedtime. 180 tablet 3   pantoprazole (PROTONIX) 20 MG tablet Take 20 mg by mouth daily.     pravastatin (PRAVACHOL) 80 MG tablet Take 1 tablet (80 mg total) by mouth at bedtime. 90 tablet 3   acetaminophen (TYLENOL) 325 MG tablet Take 325 mg by mouth daily as needed for mild pain, fever or headache.     Bepotastine Besilate (BEPREVE) 1.5 % SOLN Place 1 drop into both eyes 2 (two) times daily as needed. 10 mL 5   EPINEPHrine 0.3 mg/0.3 mL IJ SOAJ injection Inject into the muscle.     pantoprazole (PROTONIX) 40 MG tablet 20 mg daily. (Patient not taking: No sig reported)     Sodium Chloride-Sodium Bicarb 1.57 g PACK Place into the nose.     Tetrahydrozoline-Zn Sulfate (VISINE-AC OP) Apply 1 drop to eye in the morning, at noon, in the evening, and at bedtime. (Patient not taking: Reported on 12/19/2020)     TRAMADOL HCL PO as needed.     XOLAIR 150 MG injection Inject into the skin. On hold for now- 12/19/2020      No results found for this or any previous visit (from the past 48 hour(s)). No results found.  Review of Systems no recent fever, chills, nausea, vomiting or changes in her appetite  Blood pressure 120/61, pulse (!) 51, temperature 97.8 F (36.6 C), temperature source Oral, resp. rate 18, height '4\' 11"'$  (1.499 m), weight 79.8 kg, SpO2 100 %. Physical Exam  Well-nourished well-developed woman in no apparent distress.  Alert and oriented x4.  Normal mood and affect.  Gait is antalgic to the left.  The left ankle has tenderness to palpation along posterior tibial tendon.  4-5 strength in plantarflexion and  inversion.  She is unable to do a single heel rise.  Skin is healthy and intact.  No lymphadenopathy.  Pulses are palpable in the foot.   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  The risks and benefits of the alternative treatment options have been discussed in detail.  The patient wishes to proceed with surgery and specifically understands risks of bleeding, infection, nerve damage, blood clots, need for additional surgery, amputation and death.   Wylene Simmer, MD 02/14/2021, 10:20 AM

## 2021-02-13 ENCOUNTER — Encounter (HOSPITAL_BASED_OUTPATIENT_CLINIC_OR_DEPARTMENT_OTHER): Payer: Self-pay | Admitting: Orthopedic Surgery

## 2021-02-19 ENCOUNTER — Other Ambulatory Visit: Payer: Self-pay | Admitting: Family Medicine

## 2021-02-19 DIAGNOSIS — Z1231 Encounter for screening mammogram for malignant neoplasm of breast: Secondary | ICD-10-CM

## 2021-03-06 ENCOUNTER — Ambulatory Visit (INDEPENDENT_AMBULATORY_CARE_PROVIDER_SITE_OTHER): Payer: Medicare Other | Admitting: Allergy

## 2021-03-06 ENCOUNTER — Other Ambulatory Visit: Payer: Self-pay

## 2021-03-06 ENCOUNTER — Encounter: Payer: Self-pay | Admitting: Allergy

## 2021-03-06 VITALS — BP 128/76 | HR 77 | Temp 98.1°F | Resp 18

## 2021-03-06 DIAGNOSIS — J3089 Other allergic rhinitis: Secondary | ICD-10-CM | POA: Diagnosis not present

## 2021-03-06 DIAGNOSIS — H1012 Acute atopic conjunctivitis, left eye: Secondary | ICD-10-CM | POA: Diagnosis not present

## 2021-03-06 DIAGNOSIS — J4550 Severe persistent asthma, uncomplicated: Secondary | ICD-10-CM | POA: Diagnosis not present

## 2021-03-06 DIAGNOSIS — L309 Dermatitis, unspecified: Secondary | ICD-10-CM

## 2021-03-06 MED ORDER — SYMBICORT 160-4.5 MCG/ACT IN AERO: INHALATION_SPRAY | RESPIRATORY_TRACT | 5 refills | Status: DC

## 2021-03-06 NOTE — Progress Notes (Signed)
Follow-up Note  RE: Kendra Gilbert MRN: [id number] DOB: December 24, 1955 Date of Office Visit: 03/06/2021   History of present illness: Kendra Gilbert is a 65 y.o. female presenting today for follow-up of asthma and allergic rhinitis with conjunctivitis.  She was last seen in the office on 11/27/2020 by myself.  Since this visit she had left foot surgery for tendon issue on February 12, 2021.  She is currently in cast that was sting place till mid-September.  At that time she is hoping to be transition to a walking boot.  She also has a walker to use at this time.  We did stop xolair after her last visit and she does report the itching is gone and she does not feel as much joint pain (she does have osteoarthritis but she does have a degree of joint pain at baseline).   Her asthma control really depends on the weather and her activity.  She still has some wheezing and coughing but she overall feels more improved since her initial visit with me in 2020.  Due to having the cast on her foot she has not been as active as before and not been outside home as much as before.  However she states she still walks around the home which is active for her.  She states she has not needed to use albuterol in several weeks as she has been inside.  She does continue to use Symbicort 160 mcg 2 puffs twice a day.  She has Flovent to add on June to asthma flare.  She is not noticing any discolored sinus drainage and congestion at this time.  The astelin does help to a degree with nasal drainage control and she does take it twice a day.  She takes xyzal daily which she also finds helpful for her general allergy symptom control.  The bepreve eyedrop helps much better than visine.   She has a itchy rash above the area where her cat stands on her left leg that is beneath the knee.   Review of systems: Review of Systems  Constitutional:  Positive for malaise/fatigue.  HENT: Negative.    Eyes: Negative.   Respiratory:  Negative.    Cardiovascular: Negative.   Gastrointestinal: Negative.   Musculoskeletal:  Positive for joint pain.  Skin:  Positive for itching and rash.  Neurological: Negative.    All other systems negative unless noted above in HPI  Past medical/social/surgical/family history have been reviewed and are unchanged unless specifically indicated below.  See HPI  Medication List: Current Outpatient Medications  Medication Sig Dispense Refill   acetaminophen (TYLENOL) 325 MG tablet Take 325 mg by mouth daily as needed for mild pain, fever or headache.     albuterol (PROVENTIL HFA) 108 (90 Base) MCG/ACT inhaler Inhale 2 puffs into the lungs every 4 (four) hours as needed for wheezing or shortness of breath. 18 g 2   azelastine (ASTELIN) 0.1 % nasal spray Use 2 sprays in each nostrils twice daily 30 mL 3   Bepotastine Besilate (BEPREVE) 1.5 % SOLN Place 1 drop into both eyes 2 (two) times daily as needed. 10 mL 5   diphenhydrAMINE (BENADRYL) 25 MG tablet Take 25 mg by mouth every 6 (six) hours as needed.     EPINEPHrine 0.3 mg/0.3 mL IJ SOAJ injection Inject into the muscle.     fluticasone (FLOVENT HFA) 110 MCG/ACT inhaler Inhale two puffs twice daily if needed to prevent cough or wheeze.  Rinse, gargle, and spit after  use. 12 g 5   ipratropium-albuterol (DUONEB) 0.5-2.5 (3) MG/3ML SOLN Use 1 vial via nebulizer every 4-6 hours as needed for cough, wheeze, shortness of breath or chest tightness 180 mL 1   levocetirizine (XYZAL) 5 MG tablet Xyzal 5 mg tablet  Take 1 tablet every day by oral route.     losartan (COZAAR) 25 MG tablet Take 1 tablet (25 mg total) by mouth in the morning and at bedtime. 180 tablet 3   pantoprazole (PROTONIX) 40 MG tablet 20 mg daily.     pravastatin (PRAVACHOL) 80 MG tablet Take 1 tablet (80 mg total) by mouth at bedtime. 90 tablet 3   rivaroxaban (XARELTO) 10 MG TABS tablet Take 1 tablet (10 mg total) by mouth daily. 14 tablet 0   senna (SENOKOT) 8.6 MG TABS  tablet Take 2 tablets (17.2 mg total) by mouth 2 (two) times daily. 30 tablet 0   Sodium Chloride-Sodium Bicarb 1.57 g PACK Place into the nose.     SYMBICORT 160-4.5 MCG/ACT inhaler INHALE 2 PUFFS INTO THE LUNGS TWICE DAILY 10 g 5   XOLAIR 150 MG injection Inject into the skin. On hold for now- 12/19/2020 (Patient not taking: Reported on 03/06/2021)     Current Facility-Administered Medications  Medication Dose Route Frequency Provider Last Rate Last Admin   omalizumab Arvid Right) injection 375 mg  375 mg Subcutaneous Q14 Days Kennith Gain, MD   375 mg at 11/24/20 0830     Known medication allergies: Allergies  Allergen Reactions   Amlodipine Shortness Of Breath and Swelling   Bee Venom Anaphylaxis and Hives   Lisinopril Itching and Cough    Dry cough and severe itching in the throat    Other Other (See Comments)    Mosquito bites cause hives all over body   Aspirin Other (See Comments)    Causes stomach problems   Erythromycin Other (See Comments)    Ate the lining of her stomach   Hctz [Hydrochlorothiazide]     Rash    Penicillins Rash    Has patient had a PCN reaction causing immediate rash, facial/tongue/throat swelling, SOB or lightheadedness with hypotension: Yes Has patient had a PCN reaction causing severe rash involving mucus membranes or skin necrosis: Yes Has patient had a PCN reaction that required hospitalization: No  Has patient had a PCN reaction occurring within the last 10 years: No If all of the above answers are "NO", then may proceed with Cephalosporin use.      Physical examination: Blood pressure 128/76, pulse 77, temperature 98.1 F (36.7 C), temperature source Temporal, resp. rate 18, SpO2 97 %.  General: Alert, interactive, in no acute distress. HEENT: PERRLA, TMs pearly gray, turbinates non-edematous without discharge, post-pharynx non erythematous. Neck: Supple without lymphadenopathy. Lungs: Clear to auscultation without wheezing,  rhonchi or rales. {no increased work of breathing. CV: Normal S1, S2 without murmurs. Abdomen: Nondistended, nontender. Skin: Left lower extremity beneath the knee, above the edge of her cast is an erythematous macular rash.  Erythematous macules to papules across the cheeks and nasal bridg extremities: Cast in place on foot extending up the lower leg, no clubbing, cyanosis or edema. Neuro:   Grossly intact.  Diagnositics/Labs:  Spirometry: FEV1: 1.1 L 60%, FVC: 1.35 L 58% predicted.  Which is consistent with a restrictive pattern   Assessment and plan: Allergic rhinitis with conjunctivitis  - continue avoidance measures for grass pollens, weed pollens, tree pollens, mold, dust mites - continue Xyzal '5mg'$  daily.   -  continue performing nasal saline rinses as needed.  Ensure appropriate temperature of water before proceeding.  Breathe through your mouth while performing saline rinse.   - for nasal drainage continue use of nasal antihistamine, Astelin 2 sprays each nostril as needed at this time.  - for nasal congestion/obstruction can use Nasacort 2 sprays each nostril daily for 1-2 weeks at a time before stopping once symptoms improve - continue Bepreve 1 drop each eye twice a day as needed for itchy, watery eyes  Severe persistent asthma - have access to albuterol inhaler 2 puffs or albuterol 1 vial via nebulizer every 4-6 hours as needed for cough/wheeze/shortness of breath/chest tightness.  May use 15-20 minutes prior to activity.   Monitor frequency of use.    Use inhaler with spacer device.   - Symbicort 174mg 2 puffs twice a day - Use Flovent 1150m 2 puffs twice a day as needed if not meeting below goals  - doing ok off Xolair and itching and joint pain symptoms have improved thus does appear to be adverse events for you.  Will remain off Xolair.   Asthma control goals:  Full participation in all desired activities (may need albuterol before activity) Albuterol use two time or  less a week on average (not counting use with activity) Cough interfering with sleep two time or less a month Oral steroids no more than once a year No hospitalizations  Dermatitis - can use Cortisone on itchy rash on leg - epiceram moisturizer discussed and samples provided.  This is a prescription based moisturizer that may be more effective in maintaining healthy skin barrier than OTC moisturizer  Follow-up in 4-6 months or sooner if needed   I appreciate the opportunity to take part in Kendra Gilbert's care. Please do not hesitate to contact me with questions.  Sincerely,   ShPrudy FeelerMD Allergy/Immunology [facility]

## 2021-03-06 NOTE — Patient Instructions (Addendum)
-   continue avoidance measures for grass pollens, weed pollens, tree pollens, mold, dust mites - continue Xyzal '5mg'$  daily.   - continue performing nasal saline rinses as needed.  Ensure appropriate temperature of water before proceeding.  Breathe through your mouth while performing saline rinse.   - for nasal drainage continue use of nasal antihistamine, Astelin 2 sprays each nostril as needed at this time.  - for nasal congestion/obstruction can use Nasacort 2 sprays each nostril daily for 1-2 weeks at a time before stopping once symptoms improve - continue Bepreve 1 drop each eye twice a day as needed for itchy, watery eyes  - have access to albuterol inhaler 2 puffs or albuterol 1 vial via nebulizer every 4-6 hours as needed for cough/wheeze/shortness of breath/chest tightness.  May use 15-20 minutes prior to activity.   Monitor frequency of use.    Use inhaler with spacer device.   - Symbicort 161mg 2 puffs twice a day - Use Flovent 1113m 2 puffs twice a day as needed if not meeting below goals  - doing ok off Xolair and itching and joint pain symptoms have improved thus does appear to be adverse events for you.  Will remain off Xolair.   Asthma control goals:  Full participation in all desired activities (may need albuterol before activity) Albuterol use two time or less a week on average (not counting use with activity) Cough interfering with sleep two time or less a month Oral steroids no more than once a year No hospitalizations   - can use Cortisone on itchy rash on leg - epiceram moisturizer discussed and samples provided.  This is a prescription based moisturizer that may be more effective in maintaining healthy skin barrier than OTC moisturizer  Follow-up in 4-6 months or sooner if needed

## 2021-03-11 ENCOUNTER — Telehealth: Payer: Self-pay | Admitting: Allergy

## 2021-03-11 NOTE — Telephone Encounter (Signed)
[employer] called to let us know that patient's policy no longer covers bepostastine besilate. They gave a name of an alternative that her policy will cover, it is cromolyn 4% eye drops. They would like to know if the doctor would approve this. If so, they would like the patient to be contacted and it can be sent to her local pharmacy.

## 2021-03-11 NOTE — Telephone Encounter (Signed)
Please advise in place of Dr. Nelva Bush.

## 2021-03-12 MED ORDER — CROMOLYN SODIUM 4 % OP SOLN: 1.0000 [drp] | Freq: Four times a day (QID) | OPHTHALMIC | 5 refills | Status: DC | PRN

## 2021-03-12 NOTE — Telephone Encounter (Signed)
That is fine.  She can do 1 drop per eye every 6 hours as needed.  Salvatore Marvel, MD [facility]

## 2021-03-12 NOTE — Telephone Encounter (Signed)
Left detailed message of the change on pts voicemail and sent in rx cormolyn 1 gtt both eyes q6hrs prn

## 2021-03-20 ENCOUNTER — Other Ambulatory Visit: Payer: Self-pay | Admitting: Urology

## 2021-03-20 DIAGNOSIS — N281 Cyst of kidney, acquired: Secondary | ICD-10-CM

## 2021-03-25 LAB — LIPID PANEL
Chol/HDL Ratio: 2.5 ratio (ref 0.0–4.4)
Cholesterol, Total: 166 mg/dL (ref 100–199)
HDL: 67 mg/dL (ref >39)
LDL Chol Calc (NIH): 80 mg/dL (ref 0–99)
Triglycerides: 105 mg/dL (ref 0–149)
VLDL Cholesterol Cal: 19 mg/dL (ref 5–40)

## 2021-03-25 LAB — HEPATIC FUNCTION PANEL
ALT: 21 IU/L (ref 0–32)
AST: 28 IU/L (ref 0–40)
Albumin: 4.2 g/dL (ref 3.8–4.8)
Alkaline Phosphatase: 121 IU/L (ref 44–121)
Bilirubin Total: 0.6 mg/dL (ref 0.0–1.2)
Bilirubin, Direct: 0.17 mg/dL (ref 0.00–0.40)
Total Protein: 7.3 g/dL (ref 6.0–8.5)

## 2021-04-01 ENCOUNTER — Ambulatory Visit: Payer: Medicare Other

## 2021-04-10 ENCOUNTER — Ambulatory Visit
Admission: RE | Admit: 2021-04-10 | Discharge: 2021-04-10 | Disposition: A | Discharge status: Home / Self Care | Payer: Medicare Other | Source: Ambulatory Visit | Attending: Urology | Admitting: Urology

## 2021-04-10 DIAGNOSIS — N281 Cyst of kidney, acquired: Secondary | ICD-10-CM

## 2021-04-10 MED ORDER — GADOBENATE DIMEGLUMINE 529 MG/ML IV SOLN
16.0000 mL | Freq: Once | INTRAVENOUS | Status: AC | PRN
  Administered 2021-04-10: 16 mL via INTRAVENOUS

## 2021-04-20 DIAGNOSIS — S060XAA Concussion with loss of consciousness status unknown, initial encounter: Secondary | ICD-10-CM

## 2021-04-20 HISTORY — DX: Concussion with loss of consciousness status unknown, initial encounter: S06.0XAA

## 2021-04-22 ENCOUNTER — Emergency Department (HOSPITAL_COMMUNITY): Payer: Medicare Other

## 2021-04-22 ENCOUNTER — Emergency Department (HOSPITAL_COMMUNITY)
Admission: EM | Admit: 2021-04-22 | Discharge: 2021-04-22 | Disposition: A | Discharge status: Home / Self Care | Payer: Medicare Other | Attending: Emergency Medicine | Admitting: Emergency Medicine

## 2021-04-22 ENCOUNTER — Other Ambulatory Visit: Payer: Self-pay

## 2021-04-22 ENCOUNTER — Encounter (HOSPITAL_COMMUNITY): Payer: Self-pay | Admitting: Emergency Medicine

## 2021-04-22 ENCOUNTER — Ambulatory Visit
Admission: RE | Admit: 2021-04-22 | Discharge: 2021-04-22 | Disposition: A | Discharge status: Home / Self Care | Payer: Medicare Other | Source: Ambulatory Visit | Attending: Family Medicine | Admitting: Family Medicine

## 2021-04-22 DIAGNOSIS — S060X0A Concussion without loss of consciousness, initial encounter: Secondary | ICD-10-CM | POA: Diagnosis not present

## 2021-04-22 DIAGNOSIS — S0083XA Contusion of other part of head, initial encounter: Secondary | ICD-10-CM

## 2021-04-22 DIAGNOSIS — I10 Essential (primary) hypertension: Secondary | ICD-10-CM | POA: Diagnosis not present

## 2021-04-22 DIAGNOSIS — J45909 Unspecified asthma, uncomplicated: Secondary | ICD-10-CM | POA: Insufficient documentation

## 2021-04-22 DIAGNOSIS — S0990XA Unspecified injury of head, initial encounter: Secondary | ICD-10-CM

## 2021-04-22 DIAGNOSIS — Z1231 Encounter for screening mammogram for malignant neoplasm of breast: Secondary | ICD-10-CM

## 2021-04-22 DIAGNOSIS — W010XXA Fall on same level from slipping, tripping and stumbling without subsequent striking against object, initial encounter: Secondary | ICD-10-CM | POA: Diagnosis not present

## 2021-04-22 MED ORDER — ACETAMINOPHEN 500 MG PO TABS
1000.0000 mg | ORAL_TABLET | Freq: Once | ORAL | Status: AC
  Administered 2021-04-22: 1000 mg via ORAL
  Filled 2021-04-22: qty 2

## 2021-04-22 NOTE — ED Provider Notes (Signed)
[facility] EMERGENCY DEPARTMENT Provider Note   CSN: [id number] Arrival date & time: 04/22/21  3149     History Chief Complaint  Patient presents with   Head Injury    Kendra Gilbert is a 65 y.o. female presenting with head injury.  She reports she tripped 3 days ago outside and fell onto the ground, striking her face on the ground.  She reports frontal headache, nose pain, and noticed swelling and discoloration around her both eyes yesterday, prompting the ED visit.  She also reports  noting a lot of "clear liquid" draining from her nose, but states she suffers from extensive allergies and feels this could be consistent with her chronic rhinosinusitis issues.  Taking tylenol sparingly at home for headache/pain.  Not on A/C.  HPI     Past Medical History:  Diagnosis Date   Asthma    Chronic kidney disease    cyst in left kidney   Colon cancer ([place]) 2017   surgical tx  removal of polyp   Coronary artery calcification    a. POET in 2015 with no acute findings.   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/19/2020   History of rheumatoid arthritis 12/05/2020   Bilateral hand pain 12/05/2020   Tibialis posterior tendinitis 06/30/2020   Pain in left foot 06/30/2020   Acquired short Achilles tendon of left lower extremity 06/30/2020   Radial styloid tenosynovitis of right hand 06/17/2020   Pain and swelling of right wrist 05/02/2020   Localized edema 08/25/2019   Educated  about COVID-19 virus infection 08/25/2019   Alternating constipation and diarrhea 04/23/2019   Blood in feces 04/23/2019   Epigastric pain 04/23/2019   Gastroesophageal reflux disease 04/23/2019   Hematochezia 04/23/2019   Hypercholesterolemia 04/23/2019   Irritable bowel syndrome without diarrhea 04/23/2019   Polyp of colon 04/23/2019   Essential hypertension 02/14/2019   Fibromyalgia 01/16/2019   Primary osteoarthritis involving multiple joints 01/16/2019   DDD (degenerative disc disease), cervical 09/20/2018   Chest pain 02/17/2016   Dyslipidemia 02/17/2016   Family history of heart disease    Dyspnea 01/20/2016   Multiple pulmonary nodules determined by computed tomography of lung 01/20/2016   Unspecified osteoarthritis, unspecified site 11/16/2013   Coronary artery calcification 08/23/2013   Upper airway cough syndrome 08/19/2013   Exertional chest pain 08/19/2013   Restless leg syndrome 11/14/2012   Sleep apnea 10/03/2012   Interstitial lung disease ([place]) 04/12/2011   Wheezing 04/12/2011    Past Surgical History:  Procedure Laterality Date   ABDOMINAL HYSTERECTOMY     ovaries left   COLONOSCOPY WITH PROPOFOL N/A 03/23/2017   Procedure: COLONOSCOPY WITH PROPOFOL;  Surgeon: Arta Silence, MD;  Location: [facility];  Service: Endoscopy;  Laterality: N/A;   colonscopy  2017   DILATION AND CURETTAGE OF UTERUS     few done for endometriosis  GASTROC RECESSION EXTREMITY Left 02/12/2021   Procedure: Left gastroc recession;  Surgeon: Wylene Simmer, MD;  Location: [facility];  Service: Orthopedics;  Laterality: Left;     OB History   No obstetric history on file.     Family History  Problem Relation Age of Onset   CAD Mother 35       CABG/stents   Breast cancer Mother    Kidney failure Mother    Asthma Father    CAD Father 28   Allergies Father    CAD Brother    CAD Brother 44       9 stents   CAD Brother 47       7 stents   Heart attack  Brother     Social History   Tobacco Use   Smoking status: Never   Smokeless tobacco: Never  Vaping Use   Vaping Use: Never used  Substance Use Topics   Alcohol use: No   Drug use: No    Home Medications Prior to Admission medications   Medication Sig Start Date End Date Taking? Authorizing Provider  acetaminophen (TYLENOL) 325 MG tablet Take 325 mg by mouth daily as needed for mild pain, fever or headache.    [provider]  albuterol (PROVENTIL HFA) 108 (90 Base) MCG/ACT inhaler Inhale 2 puffs into the lungs every 4 (four) hours as needed for wheezing or shortness of breath. 07/31/20   Padgett, Rae Halsted, MD  azelastine (ASTELIN) 0.1 % nasal spray Use 2 sprays in each nostrils twice daily 11/27/20   Kennith Gain, MD  Bepotastine Besilate (BEPREVE) 1.5 % SOLN Place 1 drop into both eyes 2 (two) times daily as needed. 07/31/20   Kennith Gain, MD  cromolyn (OPTICROM) 4 % ophthalmic solution Place 1 drop into both eyes every 6 (six) hours as needed. 03/12/21   Valentina Shaggy, MD  diphenhydrAMINE (BENADRYL) 25 MG tablet Take 25 mg by mouth every 6 (six) hours as needed.    [provider]  EPINEPHrine 0.3 mg/0.3 mL IJ SOAJ injection Inject into the muscle. 02/13/20   [provider]  fluticasone (FLOVENT HFA) 110 MCG/ACT inhaler Inhale two puffs twice daily if needed to prevent cough or wheeze.  Rinse, gargle, and spit after use. 04/16/20   Kennith Gain, MD  ipratropium-albuterol (DUONEB) 0.5-2.5 (3) MG/3ML SOLN Use 1 vial via nebulizer every 4-6 hours as needed for cough, wheeze, shortness of breath or chest tightness 01/18/20   Kennith Gain, MD  levocetirizine (XYZAL) 5 MG tablet Xyzal 5 mg tablet  Take 1 tablet every day by oral route.    [provider]  losartan (COZAAR) 25 MG tablet Take 1 tablet (25 mg total) by mouth in the morning and at bedtime. 10/30/20   Minus Breeding, MD   pantoprazole (PROTONIX) 40 MG tablet 20 mg daily. 07/17/20   [provider]  pravastatin (PRAVACHOL) 80 MG tablet Take 1 tablet (80 mg total) by mouth at bedtime. 12/29/20 03/29/21  Minus Breeding, MD  rivaroxaban (XARELTO) 10 MG TABS tablet Take 1 tablet (10 mg total) by mouth daily. 02/12/21   Corky Sing, PA-C  senna (SENOKOT) 8.6 MG TABS tablet Take 2 tablets (17.2 mg total) by mouth 2 (two) times daily. 02/12/21   Corky Sing, PA-C  Sodium Chloride-Sodium Bicarb 1.57 g PACK Place into the nose.    [provider]  SYMBICORT 160-4.5 MCG/ACT inhaler INHALE 2 PUFFS INTO THE LUNGS TWICE  DAILY 03/06/21   Kennith Gain, MD  XOLAIR 150 MG injection Inject into the skin. On hold for now- 12/19/2020 Patient not taking: Reported on 03/06/2021 04/07/20   [provider]    Allergies    Amlodipine, Bee venom, Lisinopril, Other, Aspirin, Erythromycin, Hctz [hydrochlorothiazide], and Penicillins  Review of Systems   Review of Systems  Constitutional:  Negative for chills and fever.  HENT:  Positive for facial swelling, rhinorrhea, sinus pressure and sinus pain. Negative for ear discharge, ear pain, hearing loss, nosebleeds and voice change.   Eyes:  Negative for pain and redness.  Respiratory:  Negative for cough and shortness of breath.   Cardiovascular:  Negative for chest pain and palpitations.  Gastrointestinal:  Negative for abdominal pain and vomiting.  Genitourinary:  Negative for dysuria and hematuria.  Musculoskeletal:  Negative for arthralgias and neck pain.  Skin:  Positive for rash and wound.  Neurological:  Positive for light-headedness and headaches. Negative for syncope.  All other systems reviewed and are negative.  Physical Exam Updated Vital Signs BP (!) 108/91   Pulse 62   Temp 97.9 F (36.6 C) (Oral)   Resp (!) 22   SpO2 100%   Physical Exam Constitutional:      General: She is not in acute distress. HENT:     Head:  Normocephalic. Raccoon eyes present.     Comments: Central forehead hematoma, moderate, no open laceration or wound    Right Ear: Hearing, tympanic membrane and ear canal normal.     Left Ear: Hearing, tympanic membrane and ear canal normal.     Nose: No septal deviation.     Right Nostril: No septal hematoma.     Left Nostril: No septal hematoma.     Comments: Tenderness overlying bridge of nose, no active nasal drainage/leak Eyes:     Extraocular Movements: Extraocular movements intact.     Conjunctiva/sclera: Conjunctivae normal.     Pupils: Pupils are equal, round, and reactive to light.  Cardiovascular:     Rate and Rhythm: Normal rate and regular rhythm.  Pulmonary:     Effort: Pulmonary effort is normal. No respiratory distress.  Abdominal:     General: There is no distension.     Tenderness: There is no abdominal tenderness.  Musculoskeletal:     Cervical back: Normal range of motion and neck supple.     Comments: Left ankle in cam boot (ortho repair)  Skin:    General: Skin is warm and dry.  Neurological:     General: No focal deficit present.     Mental Status: She is alert. Mental status is at baseline.  Psychiatric:        Mood and Affect: Mood normal.        Behavior: Behavior normal.    ED Results / Procedures / Treatments   Labs (all labs ordered are listed, but only abnormal results are displayed) Labs Reviewed - No data to display  EKG None  Radiology CT Head Wo Contrast  Result Date: 04/22/2021 CLINICAL DATA:  Head trauma, mod-severe; technologist note states recent fall EXAM: CT HEAD WITHOUT CONTRAST TECHNIQUE: Contiguous axial images were obtained from the base of the skull through the vertex without intravenous contrast. COMPARISON:  None. FINDINGS: Brain: There is no acute intracranial hemorrhage, mass effect, or edema. Gray-white differentiation is preserved. There is no extra-axial fluid collection. Ventricles and sulci are within normal limits in  size and configuration. Vascular: There is atherosclerotic calcification at the  skull base. Skull: Calvarium is unremarkable. Sinuses/Orbits: No acute finding. Other: Soft tissue swelling anterior frontal scalp and right periorbital region. IMPRESSION: No evidence of acute intracranial injury. Electronically Signed   By: Macy Mis M.D.   On: 04/22/2021 10:21   CT Maxillofacial Wo Contrast  Result Date: 04/22/2021 CLINICAL DATA:  Facial trauma, evaluate for orbital fracture, right sided, and likely nasal fracture EXAM: CT MAXILLOFACIAL WITHOUT CONTRAST TECHNIQUE: Multidetector CT imaging of the maxillofacial structures was performed. Multiplanar CT image reconstructions were also generated. COMPARISON:  None. FINDINGS: Osseous: No acute facial fracture. Orbits: No intraorbital hematoma. Sinuses: Minor mucosal thickening. Soft tissues: Anterior frontal scalp soft tissue swelling. Probable asymmetric right periorbital soft tissue swelling. Limited intracranial: Better evaluated on recent dedicated imaging. IMPRESSION: No acute facial fracture. Electronically Signed   By: Macy Mis M.D.   On: 04/22/2021 11:19    Procedures Procedures   Medications Ordered in ED Medications  acetaminophen (TYLENOL) tablet 1,000 mg (1,000 mg Oral Given 04/22/21 1047)    ED Course  I have reviewed the triage vital signs and the nursing notes.  Pertinent labs & imaging results that were available during my care of the patient were reviewed by me and considered in my medical decision making (see chart for details).  Patient's after mechanical fall 3 days ago with a hematoma to the forehead and now swelling and ecchymosis around bilateral eyes.  This is likely drainage of the forehead hematoma due to gravity.  Her CT scan of the brain does not show any acute evidence skull fracture per my review or any sign of intracranial hemorrhage or bleed.  I explained she likely has a concussion from her injury.  We  discussed continuing Tylenol for her pain.  With her mention of clear liquid draining from the nose, I did feel it would be prudent to obtain a CT facial bone scan to ensure that there is no significant underlying fracture and possible CSF leak.  It is possible that this drainage may be due to her chronic seasonal allergies.  Regardless we discussed avoiding nose blowing for 2 weeks, flonase x 3 nights, and tylenol for pain.    CTH reviewed, CT facial bones reviewed - no acute traumatic fx noted. Okay for discharge    Final Clinical Impression(s) / ED Diagnoses Final diagnoses:  Injury of head, initial encounter  Traumatic hematoma of forehead, initial encounter  Concussion without loss of consciousness, initial encounter    Rx / DC Orders ED Discharge Orders     None        Kadan Millstein, Carola Rhine, MD 04/22/21 1718

## 2021-04-22 NOTE — ED Triage Notes (Signed)
Patient here for evaluation of head injury after tripping and falling on the road while walking on Monday this week. Denies loss of consciousness. Patient alert, oriented, and ambulatory at this time, has left post-op boot from ankle surgery from August 2022. Bruising and swelling around both eyes but sclera is white and clear. Patient is not anticoagulated.

## 2021-04-22 NOTE — ED Notes (Signed)
Got patient into a gown on the monitor got patient a warm blanket patient is resting with call bell in reach  

## 2021-04-28 IMAGING — MR MR ANKLE*L* W/O CM
5 series · 38 of 40 positions shown · non-contrast
Comparison: None.

CLINICAL DATA: Left ankle pain and swelling.  More medial pain.

EXAM:
MRI OF THE LEFT ANKLE WITHOUT CONTRAST
TECHNIQUE: Multiplanar, multisequence MR imaging of the ankle was performed. No
intravenous contrast was administered.

[Series 4: T2 fat-sat · axial · 3.0mm · 0.50mm/px · z∈[-93,+28]mm · 8 of 32 slices shown (1 of 2)]
[im 1/32]
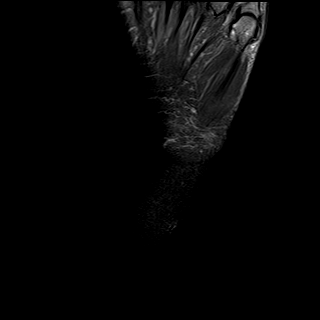
[im 4/32]
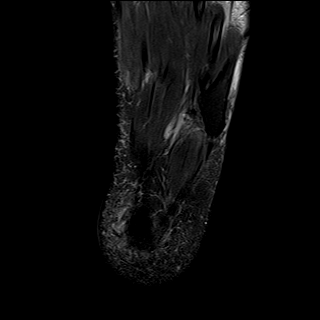
[im 11/32]
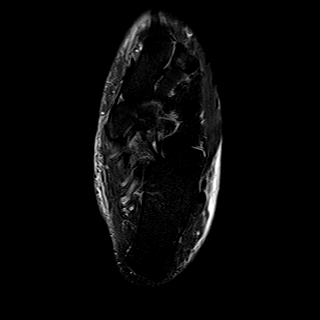
[im 14/32]
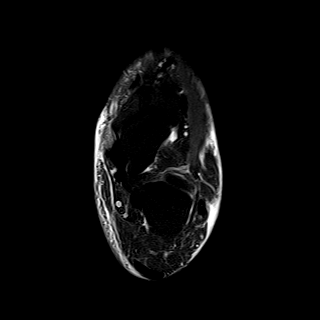
[im 18/32]
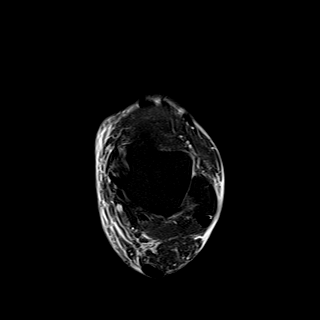
[im 21/32]
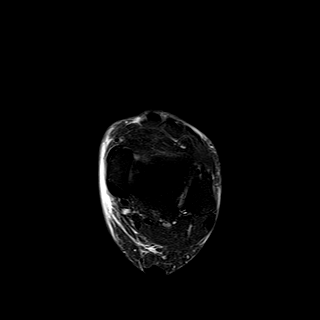
[im 28/32]
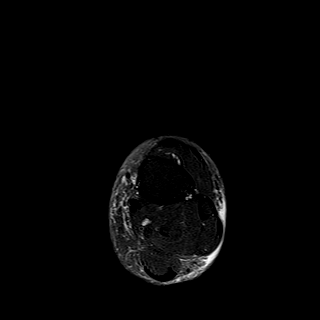
[im 32/32]
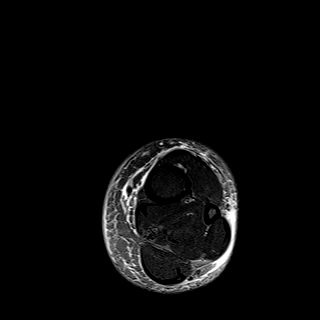

[Series 5: PD fat-sat · axial · 3.0mm · 0.50mm/px · z∈[-93,+28]mm · 9 of 32 slices shown]
[im 1/32]
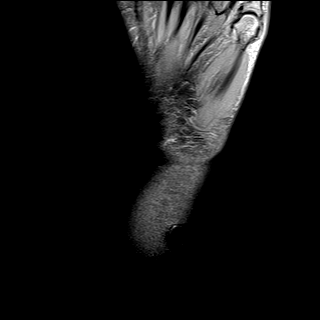
[im 4/32]
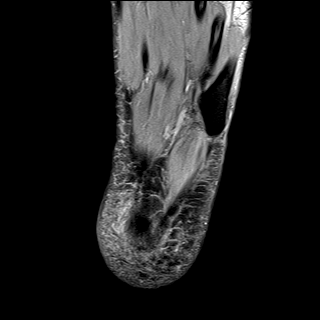
[im 8/32]
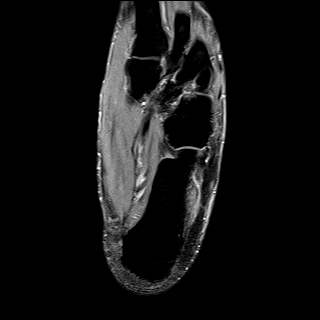
[im 12/32]
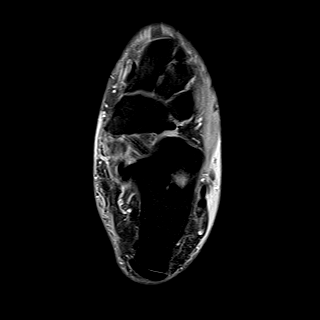
[im 16/32]
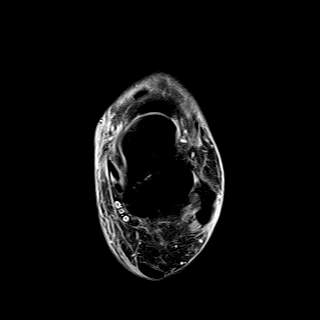
[im 20/32]
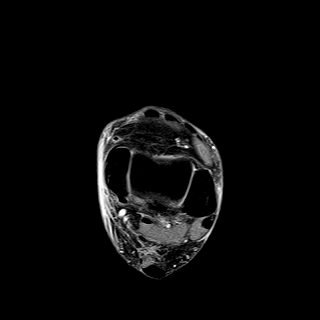
[im 24/32]
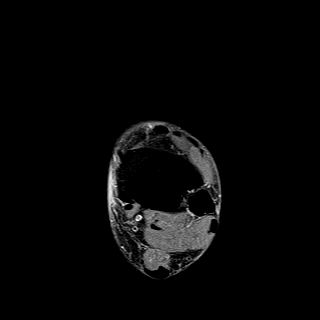
[im 28/32]
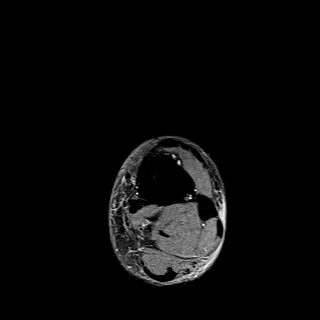
[im 32/32]
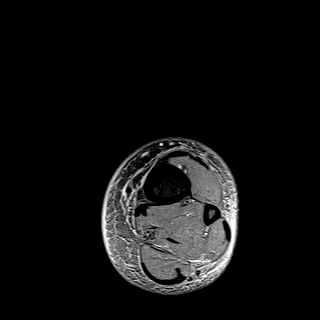

[Series 6: T1 · sagittal · 4.0mm · 0.56mm/px · 6 of 20 slices shown]
[im 1/20]
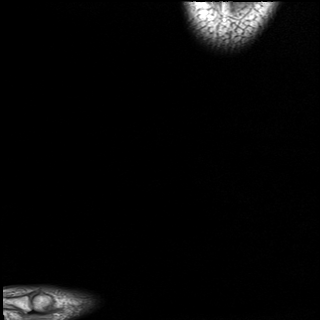
[im 4/20]
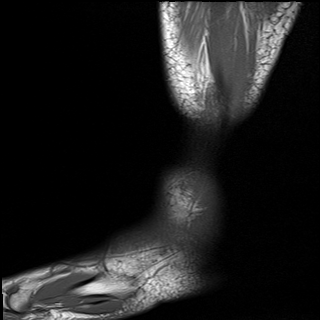
[im 8/20]
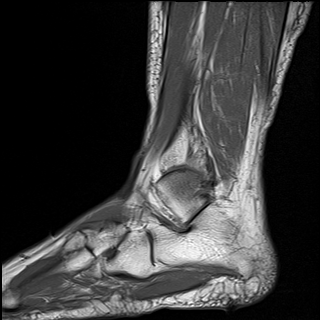
[im 12/20]
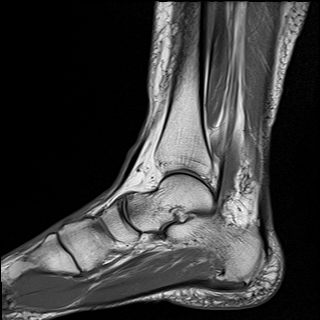
[im 16/20]
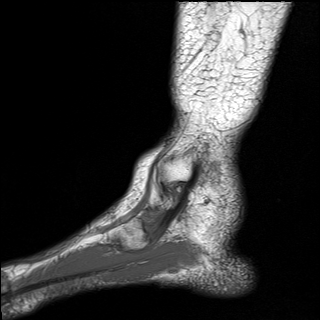
[im 20/20]
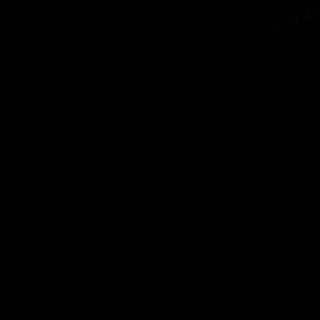

[Series 7: STIR · sagittal · 4.0mm · 0.35mm/px · 6 of 20 slices shown]
[im 1/20]
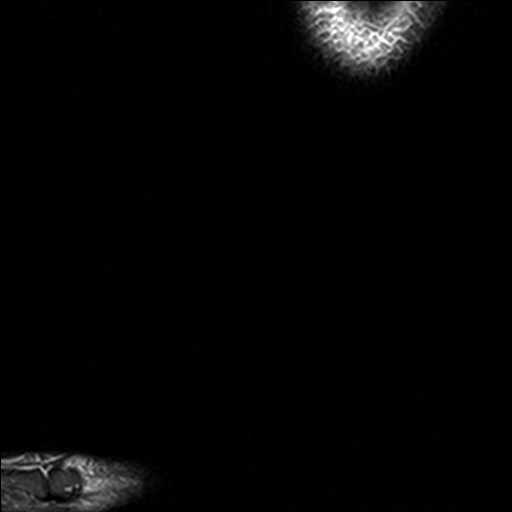
[im 4/20]
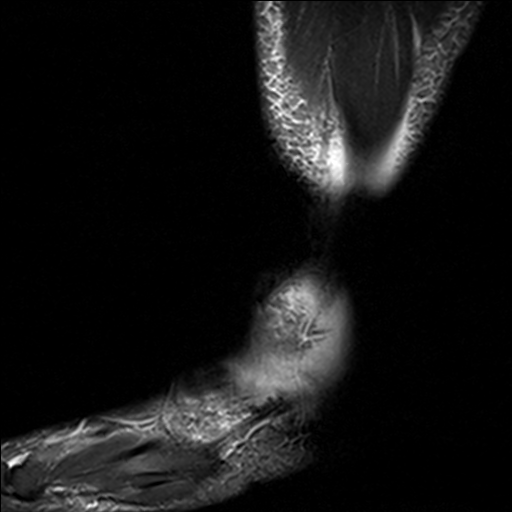
[im 8/20]
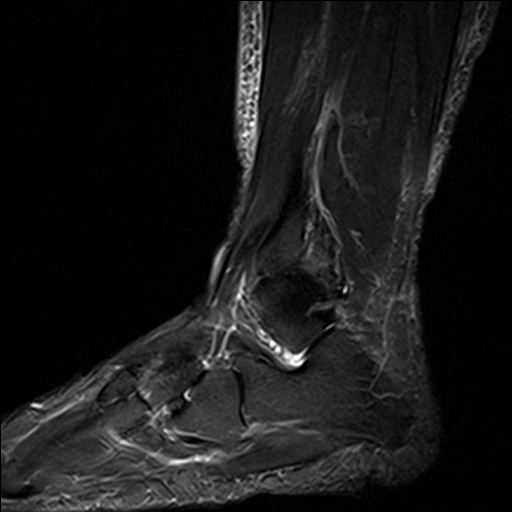
[im 12/20]
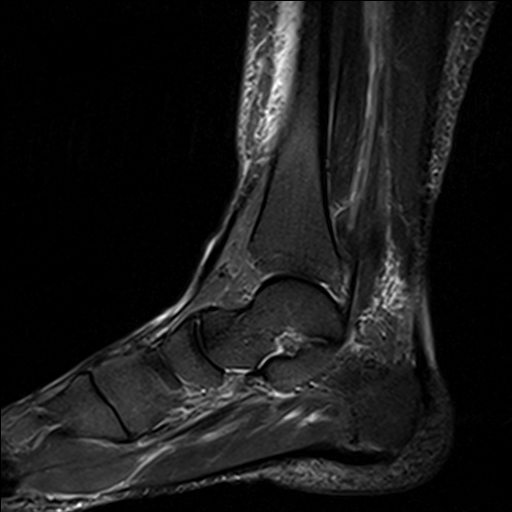
[im 16/20]
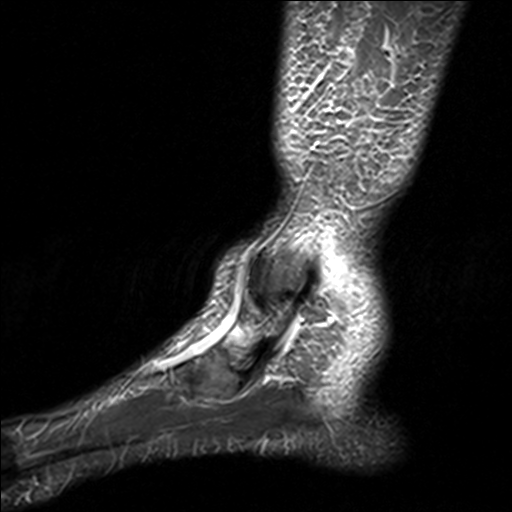
[im 20/20]
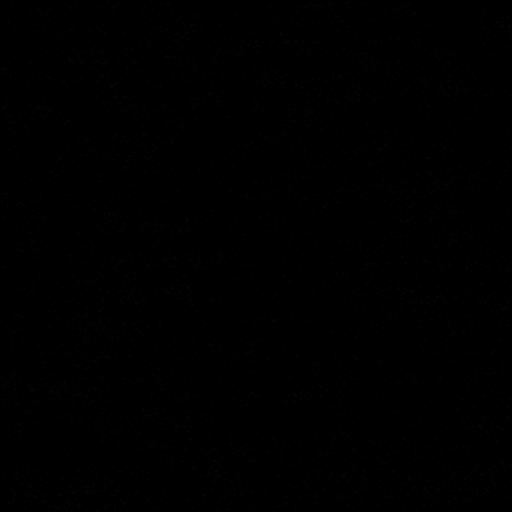

[Series 8: T2 fat-sat · coronal · 3.0mm · 0.50mm/px · 9 of 32 slices shown (2 of 2)]
[im 1/32]
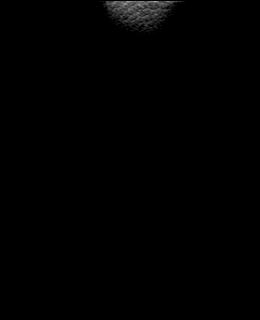
[im 4/32]
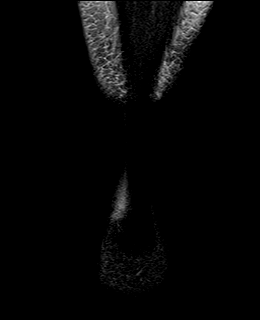
[im 8/32]
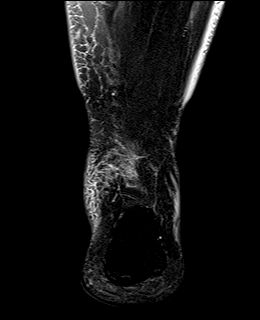
[im 12/32]
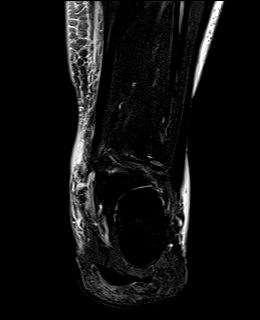
[im 16/32]
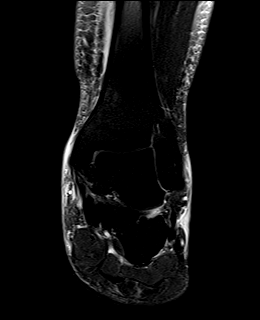
[im 20/32]
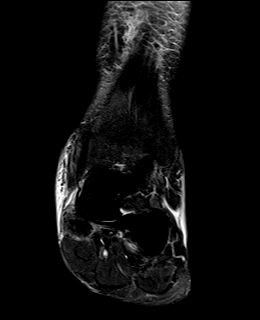
[im 24/32]
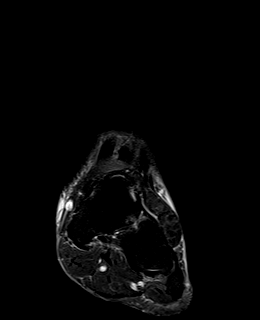
[im 28/32]
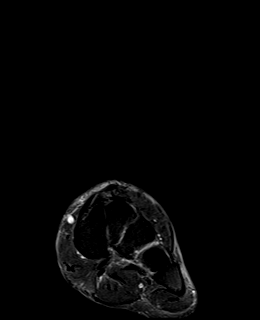
[im 32/32]
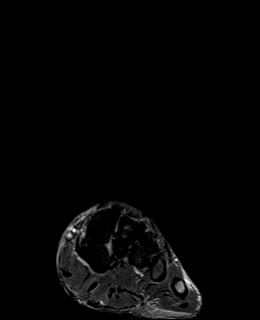

[38 of 40 positions shown; findings below may reference images not displayed]

FINDINGS: TENDONS

Peroneal: Peroneal longus tendon intact. Peroneal brevis intact.

Posteromedial: Posterior tibial tendon intact. Flexor hallucis
longus tendon intact. Flexor digitorum longus tendon intact.

Anterior: Tibialis anterior tendon intact. Extensor hallucis longus
tendon intact Extensor digitorum longus tendon intact.

Achilles:  Intact. Mild edema in Hoffa's fat.

Plantar Fascia: Mild thickening of the medial band of the plantar
fascia at the calcaneal insertion consistent with mild plantar
fasciitis. Mild subcortical marrow edema at the insertion.

LIGAMENTS

Lateral: Anterior talofibular ligament intact. Calcaneofibular
ligament intact. Posterior talofibular ligament intact. Anterior and
posterior tibiofibular ligaments intact.

Medial: Deltoid ligament intact. Spring ligament intact.

CARTILAGE

Ankle Joint: No joint effusion. Normal ankle mortise. No chondral
defect.

Subtalar Joints/Sinus Tarsi: Normal subtalar joints. No subtalar
joint effusion. Normal sinus tarsi.

Bones: No marrow signal abnormality. No fracture or dislocation.
Mild osteoarthritis of the talonavicular joint. Mild osteoarthritis
of the second and third tarsometatarsal joints.

Soft Tissue: No fluid collection or hematoma. Mild soft tissue edema
around the ankle. Muscles are normal.
IMPRESSION: 1. Mild plantar fasciitis of the medial band of the plantar fascia
at the calcaneal insertion.
2. No acute osseous injury of the left ankle.
3. Mild osteoarthritis of the talonavicular joint.
4. Mild osteoarthritis of the second and third tarsometatarsal
joints.

## 2021-05-02 ENCOUNTER — Other Ambulatory Visit: Payer: Self-pay | Admitting: Allergy

## 2021-05-06 ENCOUNTER — Other Ambulatory Visit: Payer: Self-pay

## 2021-05-06 ENCOUNTER — Ambulatory Visit (INDEPENDENT_AMBULATORY_CARE_PROVIDER_SITE_OTHER): Payer: Medicare Other | Admitting: Allergy

## 2021-05-06 ENCOUNTER — Encounter: Payer: Self-pay | Admitting: Allergy

## 2021-05-06 VITALS — BP 128/64 | HR 72 | Temp 98.0°F | Resp 20 | Ht 59.0 in | Wt 175.0 lb

## 2021-05-06 DIAGNOSIS — J3089 Other allergic rhinitis: Secondary | ICD-10-CM | POA: Diagnosis not present

## 2021-05-06 DIAGNOSIS — J4550 Severe persistent asthma, uncomplicated: Secondary | ICD-10-CM | POA: Diagnosis not present

## 2021-05-06 DIAGNOSIS — H1012 Acute atopic conjunctivitis, left eye: Secondary | ICD-10-CM | POA: Diagnosis not present

## 2021-05-06 DIAGNOSIS — S060X0D Concussion without loss of consciousness, subsequent encounter: Secondary | ICD-10-CM

## 2021-05-06 DIAGNOSIS — J300 Vasomotor rhinitis: Secondary | ICD-10-CM | POA: Diagnosis not present

## 2021-05-06 MED ORDER — IPRATROPIUM BROMIDE 0.06 % NA SOLN: 2.0000 | NASAL | 5 refills | Status: DC | PRN

## 2021-05-06 MED ORDER — EPINEPHRINE 0.3 MG/0.3ML IJ SOAJ: 0.3000 mg | INTRAMUSCULAR | 1 refills | Status: DC | PRN

## 2021-05-06 NOTE — Progress Notes (Signed)
Follow-up Note  RE: Kendra Gilbert MRN: [id number] DOB: 11-01-1955 Date of Office Visit: 05/06/2021   History of present illness: Kendra Gilbert is a 65 y.o. female presenting today for follow-up of allergic rhinoconjunctivitis and asthma status post concussion.  She was last seen in the office on 03/06/2021 by myself.  She sustained a concussion in October after a fall.  She did present to the ED on 04/22/2021 which per the ED report the accident happened 3 days prior.  She reports she tripped outside in the rain and fell to the ground striking her face on the ground.  She had frontal headache, nose pain, facial swelling and nasal drainage.  In the ED she was noted on exam to have "raccoon eyes present", "central forehead hematoma, moderate, no open laceration or wound", "tenderness overlying brding of nose, no active nasal drainage/leak".   CT brain did not show any acute evidence of skull fracture, hemorrhage or bleed.  Also had a CT facial bone which did not show any underlying fracture or CSF leak. She was treated with tylenol.  She was recommended to avoid blowing nose for 2 weeks.    She state she has been having a lot of itch on her scalp, forehead, left arm.  Also having watery, itchy eyes and nasal congestion and drainage mostly when she eats or has outdoor exposure.  She states this is all new and feels it is related to the concussion.    She states she is still having significant pain related to her injury.  She also states she is still quite light sensitive and sounds also bother her.  She did stop taking her allergy medications and using her nasal regimen after her injury as she did not know how it might affect her.  She also stopped performing her nasal rinses for now.  She states she did continue using her Symbicort 2 puffs twice a day.  Review of systems: Review of Systems  Constitutional: Negative.   HENT:  Positive for congestion.        See HPI  Eyes:        See HPI   Respiratory: Negative.    Cardiovascular: Negative.   Gastrointestinal: Negative.   Musculoskeletal: Negative.   Skin:  Positive for itching. Negative for rash.  Neurological: Negative.    All other systems negative unless noted above in HPI  Past medical/social/surgical/family history have been reviewed and are unchanged unless specifically indicated below.  No changes  Medication List: Current Outpatient Medications  Medication Sig Dispense Refill   Acetaminophen (TYLENOL PO) Take by mouth.     acetaminophen (TYLENOL) 325 MG tablet Take 325 mg by mouth daily as needed for mild pain, fever or headache.     albuterol (PROVENTIL HFA) 108 (90 Base) MCG/ACT inhaler Inhale 2 puffs into the lungs every 4 (four) hours as needed for wheezing or shortness of breath. 18 g 2   azelastine (ASTELIN) 0.1 % nasal spray USE 2 SPRAYS IN EACH NOSTRILS TWICE DAILY. 30 mL 4   Bepotastine Besilate (BEPREVE) 1.5 % SOLN Place 1 drop into both eyes 2 (two) times daily as needed. 10 mL 5   cromolyn (OPTICROM) 4 % ophthalmic solution Place 1 drop into both eyes every 6 (six) hours as needed. 10 mL 5   diphenhydrAMINE (BENADRYL) 25 MG tablet Take 25 mg by mouth every 6 (six) hours as needed.     fluticasone (FLOVENT HFA) 110 MCG/ACT inhaler Inhale two puffs twice daily if  needed to prevent cough or wheeze.  Rinse, gargle, and spit after use. 12 g 5   ipratropium (ATROVENT) 0.06 % nasal spray Place 2 sprays into both nostrils every 4 (four) hours as needed for rhinitis. 15 mL 5   ipratropium-albuterol (DUONEB) 0.5-2.5 (3) MG/3ML SOLN Use 1 vial via nebulizer every 4-6 hours as needed for cough, wheeze, shortness of breath or chest tightness 180 mL 1   levocetirizine (XYZAL) 5 MG tablet Xyzal 5 mg tablet  Take 1 tablet every day by oral route.     losartan (COZAAR) 25 MG tablet Take 1 tablet (25 mg total) by mouth in the morning and at bedtime. 180 tablet 3   pantoprazole (PROTONIX) 40 MG tablet 20 mg daily.      Sodium Chloride-Sodium Bicarb 1.57 g PACK Place into the nose.     SYMBICORT 160-4.5 MCG/ACT inhaler INHALE 2 PUFFS INTO THE LUNGS TWICE DAILY 10 g 5   EPINEPHrine 0.3 mg/0.3 mL IJ SOAJ injection Inject 0.3 mg into the muscle as needed for anaphylaxis. 2 each 1   pravastatin (PRAVACHOL) 80 MG tablet Take 1 tablet (80 mg total) by mouth at bedtime. 90 tablet 3   rivaroxaban (XARELTO) 10 MG TABS tablet Take 1 tablet (10 mg total) by mouth daily. (Patient not taking: Reported on 05/06/2021) 14 tablet 0   senna (SENOKOT) 8.6 MG TABS tablet Take 2 tablets (17.2 mg total) by mouth 2 (two) times daily. (Patient not taking: Reported on 05/06/2021) 30 tablet 0   XOLAIR 150 MG injection Inject into the skin. On hold for now- 12/19/2020 (Patient not taking: No sig reported)     Current Facility-Administered Medications  Medication Dose Route Frequency Provider Last Rate Last Admin   omalizumab Arvid Right) injection 375 mg  375 mg Subcutaneous Q14 Days Kennith Gain, MD   375 mg at 11/24/20 0830     Known medication allergies: Allergies  Allergen Reactions   Amlodipine Shortness Of Breath and Swelling   Bee Venom Anaphylaxis and Hives   Lisinopril Itching and Cough    Dry cough and severe itching in the throat    Other Other (See Comments)    Mosquito bites cause hives all over body   Aspirin Other (See Comments)    Causes stomach problems   Erythromycin Other (See Comments)    Ate the lining of her stomach   Hctz [Hydrochlorothiazide]     Rash    Penicillins Rash    Has patient had a PCN reaction causing immediate rash, facial/tongue/throat swelling, SOB or lightheadedness with hypotension: Yes Has patient had a PCN reaction causing severe rash involving mucus membranes or skin necrosis: Yes Has patient had a PCN reaction that required hospitalization: No  Has patient had a PCN reaction occurring within the last 10 years: No If all of the above answers are "NO", then may proceed with  Cephalosporin use.      Physical examination: Blood pressure 128/64, pulse 72, temperature 98 F (36.7 C), temperature source Temporal, resp. rate 20, height 4\' 11"  (1.499 m), weight 175 lb (79.4 kg), SpO2 100 %.  General: Alert, interactive, in no acute distress. HEENT: PERRLA, TMs pearly gray, turbinates minimally edematous without discharge, post-pharynx non erythematous. Neck: Supple without lymphadenopathy. Lungs: Clear to auscultation without wheezing, rhonchi or rales. {no increased work of breathing. CV: Normal S1, S2 without murmurs. Abdomen: Nondistended, nontender. Skin: Warm and dry, without lesions or rashes. Extremities:  No clubbing, cyanosis or edema. Neuro:   Grossly intact.  Diagnositics/Labs: None today  Assessment and plan: Allergic rhinitis with conjunctivitis Vasomotor rhinitis - continue avoidance measures for grass pollens, weed pollens, tree pollens, mold, dust mites - resume Xyzal 5mg  daily.   - hold your nasal rinses at this time until you have fully recovered from your concussion/injury.  . - for nasal drainage/congestion related to eating or outdoor exposure use nasal Atrovent 0.6% 1-2 sprays each nostril 5-15 minutes prior to eating or activities that trigger nasal drainage.  Can use up to 4 times a day as needed.  This should not have any taste or smell and should not burn the nose  - hold nasal your eye drop as well as nasal Astelin and Nasacort or other nasal steroid for now until you have fully recovered from your concussion/injury.    Severe persistent asthma - have access to albuterol inhaler 2 puffs or albuterol 1 vial via nebulizer every 4-6 hours as needed for cough/wheeze/shortness of breath/chest tightness.  May use 15-20 minutes prior to activity.   Monitor frequency of use.    Use inhaler with spacer device.   - Continue Symbicort 159mcg 2 puffs twice a day - Use Flovent 149mcg 2 puffs twice a day as needed if not meeting below goals    Asthma control goals:  Full participation in all desired activities (may need albuterol before activity) Albuterol use two time or less a week on average (not counting use with activity) Cough interfering with sleep two time or less a month Oral steroids no more than once a year No hospitalizations  Postconcussive syndrome -Advised if she is not seeing improvement in her symptoms including facial pain, light sensitivity, sound sensitivity etc. then would recommend she have a neurology evaluation  Follow-up in Jan 2023  I appreciate the opportunity to take part in Cydne's care. Please do not hesitate to contact me with questions.  Sincerely,   Prudy Feeler, MD Allergy/Immunology [facility]

## 2021-05-06 NOTE — Patient Instructions (Addendum)
-   continue avoidance measures for grass pollens, weed pollens, tree pollens, mold, dust mites - resume Xyzal 5mg  daily.   - hold your nasal rinses at this time until you have fully recovered from your concussion/injury.  . - for nasal drainage/congestion related to eating or outdoor exposure use nasal Atrovent 0.6% 1-2 sprays each nostril 5-15 minutes prior to eating or activities that trigger nasal drainage.  Can use up to 4 times a day as needed.  This should not have any taste or smell and should not burn the nose  - hold nasal your eye drop as well as nasal Astelin and Nasacort or other nasal steroid for now until you have fully recovered from your concussion/injury.    - have access to albuterol inhaler 2 puffs or albuterol 1 vial via nebulizer every 4-6 hours as needed for cough/wheeze/shortness of breath/chest tightness.  May use 15-20 minutes prior to activity.   Monitor frequency of use.    Use inhaler with spacer device.   - Continue Symbicort 177mcg 2 puffs twice a day - Use Flovent 127mcg 2 puffs twice a day as needed if not meeting below goals   Asthma control goals:  Full participation in all desired activities (may need albuterol before activity) Albuterol use two time or less a week on average (not counting use with activity) Cough interfering with sleep two time or less a month Oral steroids no more than once a year No hospitalizations   Follow-up in Jan 2023

## 2021-05-13 ENCOUNTER — Telehealth: Payer: Self-pay

## 2021-05-13 NOTE — Telephone Encounter (Signed)
Patient returned call

## 2021-05-13 NOTE — Telephone Encounter (Signed)
   [facility] Pre-operative Risk Assessment    Patient Name: Kendra Gilbert  DOB: Jul 17, 1955 MRN: [id number]  HEARTCARE STAFF:  - IMPORTANT!!!!!! Under Visit Info/Reason for Call, type in Other and utilize the format Clearance MM/DD/YY or Clearance TBD. Do not use dashes or single digits. - Please review there is not already an duplicate clearance open for this procedure. - If request is for dental extraction, please clarify the # of teeth to be extracted. - If the patient is currently at the dentist's office, call Pre-Op Callback Staff (MA/nurse) to input urgent request.  - If the patient is not currently in the dentist office, please route to the Pre-Op pool.  Request for surgical clearance:  What type of surgery is being performed? Right thumb carpometacarpal arthroplasty  When is this surgery scheduled? TBD  What type of clearance is required (medical clearance vs. Pharmacy clearance to hold med vs. Both)? Both  Are there any medications that need to be held prior to surgery and how long? Xarelto  Practice name and name of physician performing surgery? [facility]; Dr. Gavin Pound  What is the office phone number? [phone]   7.   What is the office fax number? [phone]  8.   Anesthesia type (None, local, MAC, general) ? N/A   Orvan July 05/13/2021, 12:05 PM  _________________________________________________________________   (provider comments below)

## 2021-05-13 NOTE — Telephone Encounter (Signed)
Left message to call back and ask to speak with preop team.   Of note, it looks like patient was started on Xarelto in 02/2021. Unclear why but suspect for a non-cardiac reason given no cardiac note indicating atrial fibrillation or need for DOAC. Will need to clarify this when patient calls back.  Darreld Mclean, PA-C 05/13/2021 2:47 PM

## 2021-05-14 NOTE — Telephone Encounter (Signed)
    Patient Name: Kendra Gilbert  DOB: [birth year] MRN: [id number]  Primary Cardiologist: Minus Breeding, MD  Chart reviewed as part of pre-operative protocol coverage. Given past medical history and time since last visit, based on ACC/AHA guidelines, Kimbly Eanes would be at acceptable risk for the planned procedure without further cardiovascular testing.   Patient currently not taking Xarelto. She was on it for one month after ankle surgery in Aug.   The patient was advised that if she develops new symptoms prior to surgery to contact our office to arrange for a follow-up visit, and she verbalized understanding.  I will route this recommendation to the requesting party via Epic fax function and remove from pre-op pool.  Please call with questions.  [place], [place] 05/14/2021, 9:09 AM

## 2021-05-14 NOTE — Telephone Encounter (Signed)
Left voice mail to call back 

## 2021-05-20 ENCOUNTER — Other Ambulatory Visit: Payer: Self-pay

## 2021-05-20 ENCOUNTER — Ambulatory Visit (INDEPENDENT_AMBULATORY_CARE_PROVIDER_SITE_OTHER): Payer: Medicare Other

## 2021-05-20 DIAGNOSIS — Z23 Encounter for immunization: Secondary | ICD-10-CM

## 2021-05-25 ENCOUNTER — Encounter (HOSPITAL_COMMUNITY): Payer: Self-pay | Admitting: Emergency Medicine

## 2021-05-25 ENCOUNTER — Other Ambulatory Visit: Payer: Self-pay

## 2021-05-25 ENCOUNTER — Emergency Department (HOSPITAL_COMMUNITY)
Admission: EM | Admit: 2021-05-25 | Discharge: 2021-05-25 | Disposition: A | Discharge status: Home / Self Care | Payer: Medicare Other | Attending: Emergency Medicine | Admitting: Emergency Medicine

## 2021-05-25 ENCOUNTER — Emergency Department (HOSPITAL_COMMUNITY): Payer: Medicare Other

## 2021-05-25 DIAGNOSIS — Z85038 Personal history of other malignant neoplasm of large intestine: Secondary | ICD-10-CM | POA: Insufficient documentation

## 2021-05-25 DIAGNOSIS — S0502XA Injury of conjunctiva and corneal abrasion without foreign body, left eye, initial encounter: Secondary | ICD-10-CM | POA: Diagnosis not present

## 2021-05-25 DIAGNOSIS — X58XXXA Exposure to other specified factors, initial encounter: Secondary | ICD-10-CM | POA: Diagnosis not present

## 2021-05-25 DIAGNOSIS — Z7951 Long term (current) use of inhaled steroids: Secondary | ICD-10-CM | POA: Diagnosis not present

## 2021-05-25 DIAGNOSIS — R519 Headache, unspecified: Secondary | ICD-10-CM | POA: Insufficient documentation

## 2021-05-25 DIAGNOSIS — I129 Hypertensive chronic kidney disease with stage 1 through stage 4 chronic kidney disease, or unspecified chronic kidney disease: Secondary | ICD-10-CM | POA: Insufficient documentation

## 2021-05-25 DIAGNOSIS — Z20822 Contact with and (suspected) exposure to covid-19: Secondary | ICD-10-CM | POA: Insufficient documentation

## 2021-05-25 DIAGNOSIS — Z79899 Other long term (current) drug therapy: Secondary | ICD-10-CM | POA: Diagnosis not present

## 2021-05-25 DIAGNOSIS — N189 Chronic kidney disease, unspecified: Secondary | ICD-10-CM | POA: Insufficient documentation

## 2021-05-25 DIAGNOSIS — J019 Acute sinusitis, unspecified: Secondary | ICD-10-CM | POA: Insufficient documentation

## 2021-05-25 DIAGNOSIS — J45909 Unspecified asthma, uncomplicated: Secondary | ICD-10-CM | POA: Insufficient documentation

## 2021-05-25 DIAGNOSIS — S0592XA Unspecified injury of left eye and orbit, initial encounter: Secondary | ICD-10-CM | POA: Diagnosis present

## 2021-05-25 LAB — BASIC METABOLIC PANEL
Anion gap: 11 (ref 5–15)
BUN: 16 mg/dL (ref 8–23)
CO2: 22 mmol/L (ref 22–32)
Calcium: 9.1 mg/dL (ref 8.9–10.3)
Chloride: 110 mmol/L (ref 98–111)
Creatinine, Ser: 0.66 mg/dL (ref 0.44–1.00)
GFR, Estimated: >60 mL/min (ref >60)
Glucose, Bld: 86 mg/dL (ref 70–99)
Potassium: 3.7 mmol/L (ref 3.5–5.1)
Sodium: 143 mmol/L (ref 135–145)

## 2021-05-25 LAB — CBC
HCT: 43.4 % (ref 36.0–46.0)
Hemoglobin: 12.9 g/dL (ref 12.0–15.0)
MCH: 27.5 pg (ref 26.0–34.0)
MCHC: 29.7 g/dL — ABNORMAL LOW (ref 30.0–36.0)
MCV: 92.5 fL (ref 80.0–100.0)
Platelets: 175 10*3/uL (ref 150–400)
RBC: 4.69 MIL/uL (ref 3.87–5.11)
RDW: 12.7 % (ref 11.5–15.5)
WBC: 9 10*3/uL (ref 4.0–10.5)
nRBC: 0 % (ref 0.0–0.2)

## 2021-05-25 LAB — RESP PANEL BY RT-PCR (FLU A&B, COVID) ARPGX2
Influenza A by PCR: NEGATIVE
Influenza B by PCR: NEGATIVE
SARS Coronavirus 2 by RT PCR: NEGATIVE

## 2021-05-25 MED ORDER — DOXYCYCLINE HYCLATE 100 MG PO CAPS: 100.0000 mg | ORAL_CAPSULE | Freq: Two times a day (BID) | ORAL | 0 refills | Status: DC

## 2021-05-25 MED ORDER — ACETAMINOPHEN 500 MG PO TABS
1000.0000 mg | ORAL_TABLET | Freq: Once | ORAL | Status: AC
  Administered 2021-05-25: 1000 mg via ORAL
  Filled 2021-05-25: qty 2

## 2021-05-25 MED ORDER — SODIUM CHLORIDE 0.9 % IV BOLUS: 1000.0000 mL | Freq: Once | INTRAVENOUS | Status: DC

## 2021-05-25 MED ORDER — SODIUM CHLORIDE 0.9 % IV SOLN: INTRAVENOUS | Status: DC

## 2021-05-25 MED ORDER — CIPROFLOXACIN HCL 0.3 % OP SOLN: 2.0000 [drp] | OPHTHALMIC | 0 refills | Status: DC

## 2021-05-25 MED ORDER — TETRACAINE HCL 0.5 % OP SOLN
2.0000 [drp] | Freq: Once | OPHTHALMIC | Status: AC
  Administered 2021-05-25: 2 [drp] via OPHTHALMIC
  Filled 2021-05-25: qty 4

## 2021-05-25 MED ORDER — FLUORESCEIN SODIUM 1 MG OP STRP
1.0000 | ORAL_STRIP | Freq: Once | OPHTHALMIC | Status: AC
  Administered 2021-05-25: 1 via OPHTHALMIC
  Filled 2021-05-25: qty 1

## 2021-05-25 NOTE — ED Notes (Addendum)
Attempted IV twice, but was unsuccessful. Notified MD and asked phlebotomy to obtain labs

## 2021-05-25 NOTE — ED Provider Notes (Signed)
[facility] EMERGENCY DEPARTMENT Provider Note   CSN: [id number] Arrival date & time: 05/25/21  3614     History Chief Complaint  Patient presents with   Headache    Kendra Gilbert is a 65 y.o. female.  Pt presents to the ED today with a headache, left eye redness and drainage, sinus pain and intermittent runny nose.  The pt did have a bad fall in October (10/19) resulting in a concussion.  She is worried her headache is related to that fall.  She said she has had a bloody nose along with yellow/green discharge.  Her left eye is painful and draining clear fluid.  She denies f/c.  She said her sx started 3 days ago.  She has had the covid and flu vaccines.      Past Medical History:  Diagnosis Date   Asthma    Chronic kidney disease    cyst in left kidney   Colon cancer ([place]) 2017   surgical tx  removal of polyp   Coronary artery calcification    a. POET in 2015 with no acute findings.   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/19/2020   History of rheumatoid arthritis 12/05/2020   Bilateral hand pain 12/05/2020   Tibialis posterior tendinitis 06/30/2020   Pain in left foot 06/30/2020   Acquired short Achilles tendon of left lower extremity 06/30/2020   Radial styloid tenosynovitis of right hand 06/17/2020   Pain and swelling of right wrist 05/02/2020   Localized edema 08/25/2019   Educated about COVID-19 virus infection 08/25/2019   Alternating constipation and  diarrhea 04/23/2019   Blood in feces 04/23/2019   Epigastric pain 04/23/2019   Gastroesophageal reflux disease 04/23/2019   Hematochezia 04/23/2019   Hypercholesterolemia 04/23/2019   Irritable bowel syndrome without diarrhea 04/23/2019   Polyp of colon 04/23/2019   Essential hypertension 02/14/2019   Fibromyalgia 01/16/2019   Primary osteoarthritis involving multiple joints 01/16/2019   DDD (degenerative disc disease), cervical 09/20/2018   Chest pain 02/17/2016   Dyslipidemia 02/17/2016   Family history of heart disease    Dyspnea 01/20/2016   Multiple pulmonary nodules determined by computed tomography of lung 01/20/2016   Unspecified osteoarthritis, unspecified site 11/16/2013   Coronary artery calcification 08/23/2013   Upper airway cough syndrome 08/19/2013   Exertional chest pain 08/19/2013   Restless leg syndrome 11/14/2012   Sleep apnea 10/03/2012   Interstitial lung disease ([place]) 04/12/2011   Wheezing 04/12/2011    Past Surgical History:  Procedure Laterality Date   ABDOMINAL HYSTERECTOMY     ovaries left   ANKLE SURGERY Left 02/12/2021   COLONOSCOPY WITH PROPOFOL N/A 03/23/2017   Procedure: COLONOSCOPY WITH PROPOFOL;  Surgeon: Arta Silence, MD;  Location: [facility];  Service: Endoscopy;  Laterality: N/A;   colonscopy  2017   DILATION AND CURETTAGE OF UTERUS  few done for endometriosis   GASTROC RECESSION EXTREMITY Left 02/12/2021   Procedure: Left gastroc recession;  Surgeon: Wylene Simmer, MD;  Location: [facility];  Service: Orthopedics;  Laterality: Left;     OB History   No obstetric history on file.     Family History  Problem Relation Age of Onset   CAD Mother 60       CABG/stents   Breast cancer Mother    Kidney failure Mother    Asthma Father    CAD Father 40   Allergies Father    CAD Brother    CAD Brother 52       9 stents   CAD Brother 71       7 stents   Heart attack Brother     Social History   Tobacco Use    Smoking status: Never   Smokeless tobacco: Never  Vaping Use   Vaping Use: Never used  Substance Use Topics   Alcohol use: No   Drug use: No    Home Medications Prior to Admission medications   Medication Sig Start Date End Date Taking? Authorizing Provider  ciprofloxacin (CILOXAN) 0.3 % ophthalmic solution Place 2 drops into both eyes every 2 (two) hours. Administer 1 drop, every 2 hours, while awake, for 2 days. Then 1 drop, every 4 hours, while awake, for the next 5 days. 05/25/21  Yes Isla Pence, MD  doxycycline (VIBRAMYCIN) 100 MG capsule Take 1 capsule (100 mg total) by mouth 2 (two) times daily. 05/25/21  Yes Isla Pence, MD  Acetaminophen (TYLENOL PO) Take by mouth.    [provider]  acetaminophen (TYLENOL) 325 MG tablet Take 325 mg by mouth daily as needed for mild pain, fever or headache.    [provider]  albuterol (PROVENTIL HFA) 108 (90 Base) MCG/ACT inhaler Inhale 2 puffs into the lungs every 4 (four) hours as needed for wheezing or shortness of breath. 07/31/20   Padgett, Rae Halsted, MD  azelastine (ASTELIN) 0.1 % nasal spray USE 2 SPRAYS IN EACH NOSTRILS TWICE DAILY. 05/04/21   Kennith Gain, MD  Bepotastine Besilate (BEPREVE) 1.5 % SOLN Place 1 drop into both eyes 2 (two) times daily as needed. 07/31/20   Kennith Gain, MD  cromolyn (OPTICROM) 4 % ophthalmic solution Place 1 drop into both eyes every 6 (six) hours as needed. 03/12/21   Valentina Shaggy, MD  diphenhydrAMINE (BENADRYL) 25 MG tablet Take 25 mg by mouth every 6 (six) hours as needed.    [provider]  EPINEPHrine 0.3 mg/0.3 mL IJ SOAJ injection Inject 0.3 mg into the muscle as needed for anaphylaxis. 05/06/21   Kennith Gain, MD  fluticasone (FLOVENT HFA) 110 MCG/ACT inhaler Inhale two puffs twice daily if needed to prevent cough or wheeze.  Rinse, gargle, and spit after use. 04/16/20   Kennith Gain, MD  ipratropium  (ATROVENT) 0.06 % nasal spray Place 2 sprays into both nostrils every 4 (four) hours as needed for rhinitis. 05/06/21   Kennith Gain, MD  ipratropium-albuterol (DUONEB) 0.5-2.5 (3) MG/3ML SOLN Use 1 vial via nebulizer every 4-6 hours as needed for cough, wheeze, shortness of breath or chest tightness 01/18/20   Kennith Gain, MD  levocetirizine (XYZAL) 5 MG tablet Xyzal 5 mg tablet  Take 1 tablet every day by oral route.    [provider]  losartan (COZAAR) 25 MG tablet Take 1 tablet (25 mg total) by mouth in the  morning and at bedtime. 10/30/20   Minus Breeding, MD  pantoprazole (PROTONIX) 40 MG tablet 20 mg daily. 07/17/20   [provider]  pravastatin (PRAVACHOL) 80 MG tablet Take 1 tablet (80 mg total) by mouth at bedtime. 12/29/20 03/29/21  Minus Breeding, MD  rivaroxaban (XARELTO) 10 MG TABS tablet Take 1 tablet (10 mg total) by mouth daily. Patient not taking: Reported on 05/06/2021 02/12/21   Corky Sing, PA-C  senna (SENOKOT) 8.6 MG TABS tablet Take 2 tablets (17.2 mg total) by mouth 2 (two) times daily. Patient not taking: Reported on 05/06/2021 02/12/21   Corky Sing, PA-C  Sodium Chloride-Sodium Bicarb 1.57 g PACK Place into the nose.    [provider]  SYMBICORT 160-4.5 MCG/ACT inhaler INHALE 2 PUFFS INTO THE LUNGS TWICE DAILY 03/06/21   Kennith Gain, MD  XOLAIR 150 MG injection Inject into the skin. On hold for now- 12/19/2020 Patient not taking: No sig reported 04/07/20   [provider]    Allergies    Amlodipine, Bee venom, Lisinopril, Other, Aspirin, Erythromycin, Hctz [hydrochlorothiazide], and Penicillins  Review of Systems   Review of Systems  HENT:  Positive for nosebleeds, sinus pressure and sinus pain.   Eyes:  Positive for discharge and redness.  Neurological:  Positive for headaches.  All other systems reviewed and are negative.  Physical Exam Updated Vital Signs BP (!) 141/63 (BP  Location: Right Arm)   Pulse 78   Temp 97.9 F (36.6 C) (Oral)   Resp 15   SpO2 100%   Physical Exam Vitals and nursing note reviewed.  Constitutional:      Appearance: She is well-developed.  HENT:     Head: Normocephalic and atraumatic.     Mouth/Throat:     Mouth: Mucous membranes are moist.     Pharynx: Oropharynx is clear.  Eyes:     Extraocular Movements: Extraocular movements intact.     Conjunctiva/sclera:     Left eye: Left conjunctiva is injected. Exudate present.     Pupils: Pupils are equal, round, and reactive to light.     Left eye: Corneal abrasion and fluorescein uptake present.  Cardiovascular:     Rate and Rhythm: Normal rate and regular rhythm.     Heart sounds: Normal heart sounds.  Pulmonary:     Effort: Pulmonary effort is normal.     Breath sounds: Normal breath sounds.  Abdominal:     General: Bowel sounds are normal.     Palpations: Abdomen is soft.  Musculoskeletal:        General: Normal range of motion.     Cervical back: Normal range of motion and neck supple.  Skin:    General: Skin is warm.  Neurological:     Mental Status: She is alert and oriented to person, place, and time.  Psychiatric:        Mood and Affect: Mood normal.        Speech: Speech normal.        Behavior: Behavior normal.   ED Results / Procedures / Treatments   Labs (all labs ordered are listed, but only abnormal results are displayed) Labs Reviewed  CBC - Abnormal; Notable for the following components:      Result Value   MCHC 29.7 (*)    All other components within normal limits  RESP PANEL BY RT-PCR (FLU A&B, COVID) ARPGX2  BASIC METABOLIC PANEL    EKG None  Radiology CT HEAD WO CONTRAST  Result Date: 05/25/2021 CLINICAL DATA:  Headache, new or worsening (Age >= 50y) EXAM: CT HEAD WITHOUT CONTRAST TECHNIQUE: Contiguous axial images were obtained from the base of the skull through the vertex without intravenous contrast. COMPARISON:  Head CT 04/22/2021  FINDINGS: Brain: No evidence of acute intracranial hemorrhage or extra-axial collection.No evidence of mass lesion/concern mass effect.The ventricles are normal in size. Vascular: No hyperdense vessel or unexpected calcification. Skull: Normal. Negative for fracture or focal lesion. Sinuses/Orbits: No acute finding. Other: Minimal residual soft tissue swelling of the lung the forehead, decreased from the prior exam. IMPRESSION: No acute intracranial abnormality. Electronically Signed   By: Maurine Simmering M.D.   On: 05/25/2021 08:52    Procedures Procedures   Medications Ordered in ED Medications  sodium chloride 0.9 % bolus 1,000 mL (1,000 mLs Intravenous Not Given 05/25/21 0831)    And  0.9 %  sodium chloride infusion ( Intravenous Not Given 05/25/21 0832)  acetaminophen (TYLENOL) tablet 1,000 mg (1,000 mg Oral Given 05/25/21 0807)  fluorescein ophthalmic strip 1 strip (1 strip Left Eye Given 05/25/21 0818)  tetracaine (PONTOCAINE) 0.5 % ophthalmic solution 2 drop (2 drops Left Eye Given 05/25/21 0818)    ED Course  I have reviewed the triage vital signs and the nursing notes.  Pertinent labs & imaging results that were available during my care of the patient were reviewed by me and considered in my medical decision making (see chart for details).    MDM Rules/Calculators/A&P                           Headache is either from the corneal abrasion or from sinusitis.  CT neg.  Covid and Flu neg.  She is d/c home with ciloxan eye drops.  She is also given rx for doxy for her sinusitis.  She is told to use a saline rinse.  She is to f/u with ophthalmology. She also wanted the number to the concussion clinic, so she is given that as well.  Pt is stable for d/c.  She is to return if worse.   Final Clinical Impression(s) / ED Diagnoses Final diagnoses:  Abrasion of left cornea, initial encounter  Acute nonintractable headache, unspecified headache type  Acute non-recurrent sinusitis, unspecified  location    Rx / DC Orders ED Discharge Orders          Ordered    doxycycline (VIBRAMYCIN) 100 MG capsule  2 times daily        05/25/21 0934    ciprofloxacin (CILOXAN) 0.3 % ophthalmic solution  Every 2 hours        05/25/21 0934             Isla Pence, MD 05/25/21 [phone]

## 2021-05-25 NOTE — ED Triage Notes (Signed)
Patient here with complaint of headache, left eye irritation, and intermittent epistaxis that started approximately three days ago. Patient concerned that these symptoms may be related to a concussion she suffered in October this year. Patient is alert, oriented, and in no apparent distress at this time.

## 2021-06-03 ENCOUNTER — Telehealth: Payer: Self-pay

## 2021-06-03 NOTE — Telephone Encounter (Signed)
Left message for patient to call back for appointment in concussion clinic.

## 2021-06-08 NOTE — Progress Notes (Signed)
Benito Mccreedy D.[facility] [place] Phone: [phone]  Assessment and Plan:     1. Concussion without loss of consciousness, initial encounter -Subacute, uncertain prognosis, initial sports medicine visit - Concussion diagnosed based off of HPI, physical exam, special testing - Mild improvement in concussion-like symptoms since fall on 04/22/2021, although patient continues to have significant headaches - Continue mental and physical activity as tolerated keeping symptoms <3/10  2. Acute post-traumatic headache, not intractable -Subacute, initial visit - Likely multifactorial with patient experiencing allergy-like symptoms, concussion headache exacerbated by triggers like light and sound, continued neck tightness - Start HEP for range of motion of neck - Continue allergy medications and asthma medications  3. Ataxia -Subacute, initial visit - Likely multifactorial with patient experiencing multiple musculoskeletal complaints including left ankle pain and is currently in a walking boot - Start vestibular therapy   Date of injury was 04/22/21. Symptom severity scores of 15 and 60 today.  The patient was counseled on the nature of the injury, typical course and potential options for further evaluation and treatment. Discussed the importance of compliance with recommendations. Patient stated understanding of this plan and willingness to comply.  - Recommend light aerobic activity while keeping symptoms <3/10 as long as >48 hours from concussive event - Eliminate screen time as much as possible for first 48 hours from concussive event, then continue limited screen time   - Encouraged to RTC in 2 weeks for reassessment or sooner for any concerns or acute changes   Pertinent previous records reviewed include ER note 05/25/2021, ER note 04/22/2021, CT head 05/25/2021   Time of visit 45 minutes, which included chart review, physical exam,  treatment plan, symptom severity score, VOMS, and tandem gait testing being performed, interpreted, and discussed with patient at today's visit.   Subjective:    Chief Complaint: concussion like symptoms   HPI:   06/09/21 Patient is a 65 year old female presenting with concussion like symptoms after a fall on 04/22/21. Patient was seen at the ED on 04/22/21 stating that she tripped outside and hit her face on the ground. Patient has noticed frontal headaches, nose pain, swelling and discoloration around both eyes. Patient was seen back at the ED and eye doctor on 05/25/21 C/O headaches, but was unsure if this was related to sinusitis or her fall. Patient was to continue taking tylenol and to follow up with sports medicine for evaluation and treatment.      Concussion HPI:  - Injury date: 04/22/21   - Mechanism of injury: fall  - LOC: no  - Initial evaluation: 04/22/21  - Previous head injuries/concussions: no   - Previous imaging: yes    - Social history: activities include retired  Hospitalization for head injury? No Diagnosed/treated for headache disorder or migraines? yes Diagnosed with learning disability [name] No Diagnosed with ADD/ADHD? No Diagnose with Depression, anxiety, or other Psychiatric Disorder? No   Current medications:  Current Outpatient Medications  Medication Sig Dispense Refill   Acetaminophen (TYLENOL PO) Take by mouth.     acetaminophen (TYLENOL) 325 MG tablet Take 325 mg by mouth daily as needed for mild pain, fever or headache.     albuterol (PROVENTIL HFA) 108 (90 Base) MCG/ACT inhaler Inhale 2 puffs into the lungs every 4 (four) hours as needed for wheezing or shortness of breath. 18 g 2   azelastine (ASTELIN) 0.1 % nasal spray USE 2 SPRAYS IN EACH NOSTRILS TWICE DAILY. [place]  mL 4   Bepotastine Besilate (BEPREVE) 1.5 % SOLN Place 1 drop into both eyes 2 (two) times daily as needed. 10 mL 5   ciprofloxacin (CILOXAN) 0.3 % ophthalmic solution Place 2  drops into both eyes every 2 (two) hours. Administer 1 drop, every 2 hours, while awake, for 2 days. Then 1 drop, every 4 hours, while awake, for the next 5 days. 5 mL 0   cromolyn (OPTICROM) 4 % ophthalmic solution Place 1 drop into both eyes every 6 (six) hours as needed. 10 mL 5   diphenhydrAMINE (BENADRYL) 25 MG tablet Take 25 mg by mouth every 6 (six) hours as needed.     doxycycline (VIBRAMYCIN) 100 MG capsule Take 1 capsule (100 mg total) by mouth 2 (two) times daily. 14 capsule 0   EPINEPHrine 0.3 mg/0.3 mL IJ SOAJ injection Inject 0.3 mg into the muscle as needed for anaphylaxis. 2 each 1   fluticasone (FLOVENT HFA) 110 MCG/ACT inhaler Inhale two puffs twice daily if needed to prevent cough or wheeze.  Rinse, gargle, and spit after use. 12 g 5   ipratropium (ATROVENT) 0.06 % nasal spray Place 2 sprays into both nostrils every 4 (four) hours as needed for rhinitis. 15 mL 5   ipratropium-albuterol (DUONEB) 0.5-2.5 (3) MG/3ML SOLN Use 1 vial via nebulizer every 4-6 hours as needed for cough, wheeze, shortness of breath or chest tightness 180 mL 1   levocetirizine (XYZAL) 5 MG tablet Xyzal 5 mg tablet  Take 1 tablet every day by oral route.     losartan (COZAAR) 25 MG tablet Take 1 tablet (25 mg total) by mouth in the morning and at bedtime. 180 tablet 3   pantoprazole (PROTONIX) 40 MG tablet 20 mg daily.     rivaroxaban (XARELTO) 10 MG TABS tablet Take 1 tablet (10 mg total) by mouth daily. 14 tablet 0   senna (SENOKOT) 8.6 MG TABS tablet Take 2 tablets (17.2 mg total) by mouth 2 (two) times daily. 30 tablet 0   Sodium Chloride-Sodium Bicarb 1.57 g PACK Place into the nose.     SYMBICORT 160-4.5 MCG/ACT inhaler INHALE 2 PUFFS INTO THE LUNGS TWICE DAILY 10 g 5   XOLAIR 150 MG injection Inject into the skin. On hold for now- 12/19/2020     pravastatin (PRAVACHOL) 80 MG tablet Take 1 tablet (80 mg total) by mouth at bedtime. 90 tablet 3   Current Facility-Administered Medications  Medication  Dose Route Frequency Provider Last Rate Last Admin   omalizumab Arvid Right) injection 375 mg  375 mg Subcutaneous Q14 Days Kennith Gain, MD   375 mg at 11/24/20 0830      Objective:     Vitals:   06/09/21 0855  BP: 130/80  Pulse: 87  SpO2: 98%  Weight: 177 lb (80.3 kg)  Height: 4\' 11"  (1.499 m)      Body mass index is 35.75 kg/m.    Physical Exam:     General: Well-appearing, cooperative, sitting comfortably in no acute distress.  Psychiatric: Mood and affect are appropriate.     Today's Symptom Severity Score:  Scores: 0-6  Headache:5 "Pressure in head":5  Neck Pain:3  Nausea or vomiting:2  Dizziness:4  Blurred vision:5  Balance problems:4  Sensitivity to light:6  Sensitivity to noise:4  Feeling slowed down:3  Feeling like "in a fog":0  "Don't feel right":4  Difficulty concentrating:5  Difficulty remembering:0  Fatigue or low energy:6  Confusion:0  Drowsiness:2  More emotional:0  Irritability:2  Sadness:0  Nervous or Anxious:0  Trouble falling asleep:0   Total number of symptoms: 15/22  Symptom Severity index: 60/132  Worse with physical activity? yes Worse with mental activity? yes Percent improved since injury: 0%    Full pain-free cervical PROM: No, "tightness" especially with sidebending   Tandem gait: Patient with walking boot on left foot - Forward, eyes open: 0 errors - Backward, eyes open: 2 errors - Forward, eyes closed: 4 errors - Backward, eyes closed: 4 errors  VOMS:   - Baseline symptoms: Mild headache - Smooth pursuits: Eye pressure - Vertical Saccades: Eye pressure - Horizontal Saccades: Eye pressure - Vertical Vestibular-Ocular Reflex: Eye pressure - Horizontal Vestibular-Ocular Reflex: Dizziness 7/10  - Visual Motion Sensitivity Test: Dizziness 4/10  - Convergence: Patient recovering from corneal abrasion, so unable to complete this exam    Electronically signed by:  Benito Mccreedy D.[facility] 9:36 AM 06/09/21

## 2021-06-09 ENCOUNTER — Ambulatory Visit (INDEPENDENT_AMBULATORY_CARE_PROVIDER_SITE_OTHER): Payer: Medicare Other | Admitting: Sports Medicine

## 2021-06-09 ENCOUNTER — Other Ambulatory Visit: Payer: Self-pay

## 2021-06-09 VITALS — BP 130/80 | HR 87 | Ht 59.0 in | Wt 177.0 lb

## 2021-06-09 DIAGNOSIS — R270 Ataxia, unspecified: Secondary | ICD-10-CM

## 2021-06-09 DIAGNOSIS — S060X0A Concussion without loss of consciousness, initial encounter: Secondary | ICD-10-CM | POA: Diagnosis not present

## 2021-06-09 DIAGNOSIS — G44319 Acute post-traumatic headache, not intractable: Secondary | ICD-10-CM | POA: Diagnosis not present

## 2021-06-09 NOTE — Patient Instructions (Addendum)
Good to see you  Neck exercises ROM given Referral to vestibular therapy can call: [facility]: [place] [facility]: [phone] [address] Let us know which one works best and we will send that referral in for you   Limit physical and mental activity to Less than 3/10 symptom wise  Follow up in two weeks

## 2021-06-10 ENCOUNTER — Telehealth: Payer: Self-pay | Admitting: Sports Medicine

## 2021-06-10 DIAGNOSIS — S060X0A Concussion without loss of consciousness, initial encounter: Secondary | ICD-10-CM

## 2021-06-10 DIAGNOSIS — R270 Ataxia, unspecified: Secondary | ICD-10-CM

## 2021-06-10 DIAGNOSIS — G44319 Acute post-traumatic headache, not intractable: Secondary | ICD-10-CM

## 2021-06-10 NOTE — Telephone Encounter (Signed)
Have placed the referral for vestibular therapy to [facility] on [address]

## 2021-06-10 NOTE — Telephone Encounter (Signed)
Patient has decided she would like to be referred for PT to [facility]  Fax [phone]  Please enter referral.

## 2021-06-16 DIAGNOSIS — M25512 Pain in left shoulder: Secondary | ICD-10-CM | POA: Insufficient documentation

## 2021-06-16 DIAGNOSIS — M25522 Pain in left elbow: Secondary | ICD-10-CM | POA: Insufficient documentation

## 2021-06-22 NOTE — Progress Notes (Signed)
Kendra Gilbert D.[place] Phone: [phone]  Assessment and Plan:     1. Concussion without loss of consciousness, initial encounter -Subacute, subsequent visit - Mild improvement in concussion-like symptoms, however multiple symptoms appear to be more related to chronic seasonal allergies -I feel the patient has largely recovered from any concussion-like symptoms despite symptomatic symptom score.  We will continue to treat with rest and vestibular therapy for an additional 2 to 3 weeks and reassess  2. Acute post-traumatic headache, not intractable -Subacute, subsequent visit - Likely multifactorial with chronic allergies complicating picture - Recommend continuing HEP for neck range of motion - Follow-up with allergist in 07/2021  3. Ataxia -Subacute, improving - Likely multifactorial with patient experiencing multiple musculoskeletal complaints - Scheduled to start vestibular therapy this week   Date of injury was 04/22/2021. Symptom severity scores of 15 and 69 today. Original symptom severity scores were 15 and 60. The patient was counseled on the nature of the injury, typical course and potential options for further evaluation and treatment. Discussed the importance of compliance with recommendations. Patient stated understanding of this plan and willingness to comply.  Recommendations:  -  Complete mental and physical rest for 48 hours after concussive event - Recommend light aerobic activity while keeping symptoms less than 3/10 - Stop mental or physical activities that cause symptoms to worsen greater than 3/10, and wait 24 hours before attempting them again - Eliminate screen time as much as possible for first 48 hours after concussive event, then continue limited screen time (recommend less than 2 hours per day)   - Encouraged to RTC in 2 to 3 weeks for reassessment or sooner for any concerns or acute changes    Pertinent previous records reviewed include none   Time of visit 36 minutes, which included chart review, physical exam, treatment plan, symptom severity score, VOMS, and tandem gait testing being performed, interpreted, and discussed with patient at today's visit.   Subjective:    I, Kendra Gilbert, am serving as a Education administrator for Doctor Glennon Mac  Chief Complaint: concussion symptoms   HPI:   06/09/21 Patient is a 65 year old female presenting with concussion like symptoms after a fall on 04/22/21. Patient was seen at the ED on 04/22/21 stating that she tripped outside and hit her face on the ground. Patient has noticed frontal headaches, nose pain, swelling and discoloration around both eyes. Patient was seen back at the ED and eye doctor on 05/25/21 C/O headaches, but was unsure if this was related to sinusitis or her fall. Patient was to continue taking tylenol and to follow up with sports medicine for evaluation and treatment.    06/23/2021 Patient states that she still has a lot of head pains feels like she has bubbles in her ears a lot of nasal pain and sometimes teeth hurt, a lot of neck issues, tried the neck exercises but felt crunchy and decided to leave it alone. Still complaining of eye discomfort has limited screen time and mental activity. Has a rotator cuff strain she learned from [facility] will be seeing shoulder doctor next Thursday already has an appointment with shoulder doc      Concussion HPI:  - Injury date: 04/22/21   - Mechanism of injury: fall  - LOC: no  - Initial evaluation: 04/22/21  - Previous head injuries/concussions: no   - Previous imaging: yes    - Social history: activities include retired  Hospitalization for head injury? No Diagnosed/treated for headache disorder or migraines? yes Diagnosed with learning disability [name] No Diagnosed with ADD/ADHD? No Diagnose with Depression, anxiety, or other Psychiatric Disorder? No       Current medications:  Current Outpatient Medications  Medication Sig Dispense Refill   Acetaminophen (TYLENOL PO) Take by mouth.     acetaminophen (TYLENOL) 325 MG tablet Take 325 mg by mouth daily as needed for mild pain, fever or headache.     albuterol (PROVENTIL HFA) 108 (90 Base) MCG/ACT inhaler Inhale 2 puffs into the lungs every 4 (four) hours as needed for wheezing or shortness of breath. 18 g 2   azelastine (ASTELIN) 0.1 % nasal spray USE 2 SPRAYS IN EACH NOSTRILS TWICE DAILY. 30 mL 4   Bepotastine Besilate (BEPREVE) 1.5 % SOLN Place 1 drop into both eyes 2 (two) times daily as needed. 10 mL 5   ciprofloxacin (CILOXAN) 0.3 % ophthalmic solution Place 2 drops into both eyes every 2 (two) hours. Administer 1 drop, every 2 hours, while awake, for 2 days. Then 1 drop, every 4 hours, while awake, for the next 5 days. 5 mL 0   cromolyn (OPTICROM) 4 % ophthalmic solution Place 1 drop into both eyes every 6 (six) hours as needed. 10 mL 5   diphenhydrAMINE (BENADRYL) 25 MG tablet Take 25 mg by mouth every 6 (six) hours as needed.     doxycycline (VIBRAMYCIN) 100 MG capsule Take 1 capsule (100 mg total) by mouth 2 (two) times daily. 14 capsule 0   EPINEPHrine 0.3 mg/0.3 mL IJ SOAJ injection Inject 0.3 mg into the muscle as needed for anaphylaxis. 2 each 1   fluticasone (FLOVENT HFA) 110 MCG/ACT inhaler Inhale two puffs twice daily if needed to prevent cough or wheeze.  Rinse, gargle, and spit after use. 12 g 5   ipratropium (ATROVENT) 0.06 % nasal spray Place 2 sprays into both nostrils every 4 (four) hours as needed for rhinitis. 15 mL 5   ipratropium-albuterol (DUONEB) 0.5-2.5 (3) MG/3ML SOLN Use 1 vial via nebulizer every 4-6 hours as needed for cough, wheeze, shortness of breath or chest tightness 180 mL 1   levocetirizine (XYZAL) 5 MG tablet Xyzal 5 mg tablet  Take 1 tablet every day by oral route.     losartan (COZAAR) 25 MG tablet Take 1 tablet (25 mg total) by mouth in the morning and at  bedtime. 180 tablet 3   pantoprazole (PROTONIX) 40 MG tablet 20 mg daily.     pravastatin (PRAVACHOL) 80 MG tablet Take 1 tablet (80 mg total) by mouth at bedtime. 90 tablet 3   rivaroxaban (XARELTO) 10 MG TABS tablet Take 1 tablet (10 mg total) by mouth daily. 14 tablet 0   senna (SENOKOT) 8.6 MG TABS tablet Take 2 tablets (17.2 mg total) by mouth 2 (two) times daily. 30 tablet 0   Sodium Chloride-Sodium Bicarb 1.57 g PACK Place into the nose.     SYMBICORT 160-4.5 MCG/ACT inhaler INHALE 2 PUFFS INTO THE LUNGS TWICE DAILY 10 g 5   XOLAIR 150 MG injection Inject into the skin. On hold for now- 12/19/2020     Current Facility-Administered Medications  Medication Dose Route Frequency Provider Last Rate Last Admin   omalizumab Arvid Right) injection 375 mg  375 mg Subcutaneous Q14 Days Kennith Gain, MD   375 mg at 11/24/20 0830      Objective:     Vitals:   06/23/21 0746  BP: 120/62  Pulse:  68  SpO2: 98%  Height: 4\' 11"  (1.499 m)      Body mass index is 35.75 kg/m.    Physical Exam:     General: Well-appearing, cooperative, sitting comfortably in no acute distress.  Psychiatric: Mood and affect are appropriate.     Today's Symptom Severity Score:  Scores: 0-6  Headache:6 "Pressure in head":6  Neck Pain:6  Nausea or vomiting:3  Dizziness:3  Blurred vision:3  Balance problems:4  Sensitivity to light:6  Sensitivity to noise:6  Feeling slowed down:3  Feeling like in a fog:0  Dont feel right:0  Difficulty concentrating:3  Difficulty remembering:2  Fatigue or low energy:4  Confusion:0  Drowsiness:2  More emotional:0  Irritability:2  Sadness:0  Nervous or Anxious:0  Trouble falling asleep:0   Total number of symptoms: 15/22  Symptom Severity index: 69/132  Worse with physical activity? yes Worse with mental activity? yes Percent improved since injury: 45%    Full pain-free cervical PROM: No, rotation limited by "crunching"  Tandem gait: -  Forward, eyes open: 0 errors - Backward, eyes open: 0 errors - Forward, eyes closed: 2 errors - Backward, eyes closed: 3 errors  VOMS:   - Baseline symptoms: Head pressure - Vertical Vestibular-Ocular Reflex: Head pressure 3/10  - Horizontal Vestibular-Ocular Reflex: Head pressure 3/10  - Smooth pursuits: Head pressure 3/10  - Vertical Saccades: Head pressure 3/10  - Horizontal Saccades: Head pressure 3/10  - Visual Motion Sensitivity Test: Head pressure 3/10, dizzy 2/10      Electronically signed by:  Kendra Gilbert D.[facility] 8:40 AM 06/23/21

## 2021-06-23 ENCOUNTER — Other Ambulatory Visit: Payer: Self-pay

## 2021-06-23 ENCOUNTER — Ambulatory Visit (INDEPENDENT_AMBULATORY_CARE_PROVIDER_SITE_OTHER): Payer: Medicare Other | Admitting: Sports Medicine

## 2021-06-23 VITALS — BP 120/62 | HR 68 | Ht 59.0 in

## 2021-06-23 DIAGNOSIS — G44319 Acute post-traumatic headache, not intractable: Secondary | ICD-10-CM | POA: Diagnosis not present

## 2021-06-23 DIAGNOSIS — R270 Ataxia, unspecified: Secondary | ICD-10-CM

## 2021-06-23 DIAGNOSIS — S060X0A Concussion without loss of consciousness, initial encounter: Secondary | ICD-10-CM | POA: Diagnosis not present

## 2021-06-23 NOTE — Patient Instructions (Addendum)
Good to see you  Continue vestibular therapy  2/3 week follow up

## 2021-07-10 ENCOUNTER — Other Ambulatory Visit: Payer: Self-pay

## 2021-07-10 ENCOUNTER — Encounter: Payer: Self-pay | Admitting: Allergy

## 2021-07-10 ENCOUNTER — Ambulatory Visit (INDEPENDENT_AMBULATORY_CARE_PROVIDER_SITE_OTHER): Payer: Medicare Other | Admitting: Allergy

## 2021-07-10 VITALS — BP 122/62 | HR 100 | Temp 98.2°F | Resp 18

## 2021-07-10 DIAGNOSIS — J3089 Other allergic rhinitis: Secondary | ICD-10-CM

## 2021-07-10 DIAGNOSIS — J4550 Severe persistent asthma, uncomplicated: Secondary | ICD-10-CM

## 2021-07-10 DIAGNOSIS — H1013 Acute atopic conjunctivitis, bilateral: Secondary | ICD-10-CM

## 2021-07-10 DIAGNOSIS — H1012 Acute atopic conjunctivitis, left eye: Secondary | ICD-10-CM

## 2021-07-10 MED ORDER — FLUTICASONE PROPIONATE HFA 110 MCG/ACT IN AERO: INHALATION_SPRAY | RESPIRATORY_TRACT | 5 refills | Status: DC

## 2021-07-10 NOTE — Progress Notes (Signed)
Follow-up Note  RE: Kendra Gilbert MRN: [id number] DOB: [birth year] Date of Office Visit: 07/10/2021   History of present illness: Kendra Gilbert is a 66 y.o. female presenting today for follow-up of allergic rhinitis with conjunctivitis, vasomotor rhinitis, asthma.  She had a concussive incident last fall that she is still recovering from.   She states 2020 she was not a great year for her.  Over the holidays she felt sick for most of it.  She reports having fever over the weekend and having more cough and congestion.  She has been trying to rest more however the cough keeps her awake at night.  She also has been having hoarseness.  She also has having more congestion and drainage however she is not using any nasal sprays right now due to nasal irritation that she has had since the concussion.  She states her nose is still quite sensitive.  She states she had sinus infections in November and December.  She is taking the Xyzal on a daily basis.  She is also taking Symbicort 160 mcg 2 puffs twice a day and Flovent 110 mcg 2 puffs twice a day for her asthma regimen.  She is concerned about her symptoms at this time however she is worried that from other medications might have impact on her current medications or worsen symptoms.  Review of systems: Review of Systems  Constitutional:  Positive for fatigue.  HENT:  Positive for congestion, postnasal drip, sore throat and voice change.   Eyes: Negative.   Respiratory:  Positive for cough and shortness of breath.   Cardiovascular: Negative.   Gastrointestinal: Negative.   Musculoskeletal: Negative.   Skin: Negative.   Allergic/Immunologic: Negative.   Neurological: Negative.     All other systems negative unless noted above in HPI  Past medical/social/surgical/family history have been reviewed and are unchanged unless specifically indicated below.  No changes  Medication List: Current Outpatient Medications  Medication Sig Dispense  Refill   Acetaminophen (TYLENOL PO) Take by mouth.     acetaminophen (TYLENOL) 325 MG tablet Take 325 mg by mouth daily as needed for mild pain, fever or headache.     albuterol (PROVENTIL HFA) 108 (90 Base) MCG/ACT inhaler Inhale 2 puffs into the lungs every 4 (four) hours as needed for wheezing or shortness of breath. 18 g 2   azelastine (ASTELIN) 0.1 % nasal spray USE 2 SPRAYS IN EACH NOSTRILS TWICE DAILY. 30 mL 4   Bepotastine Besilate (BEPREVE) 1.5 % SOLN Place 1 drop into both eyes 2 (two) times daily as needed. 10 mL 5   ciprofloxacin (CILOXAN) 0.3 % ophthalmic solution Place 2 drops into both eyes every 2 (two) hours. Administer 1 drop, every 2 hours, while awake, for 2 days. Then 1 drop, every 4 hours, while awake, for the next 5 days. 5 mL 0   cromolyn (OPTICROM) 4 % ophthalmic solution Place 1 drop into both eyes every 6 (six) hours as needed. 10 mL 5   diphenhydrAMINE (BENADRYL) 25 MG tablet Take 25 mg by mouth every 6 (six) hours as needed.     EPINEPHrine 0.3 mg/0.3 mL IJ SOAJ injection Inject 0.3 mg into the muscle as needed for anaphylaxis. 2 each 1   fluticasone (FLOVENT HFA) 110 MCG/ACT inhaler Inhale two puffs twice daily if needed to prevent cough or wheeze.  Rinse, gargle, and spit after use. 12 g 5   ipratropium (ATROVENT) 0.06 % nasal spray Place 2 sprays into both nostrils every 4 (  four) hours as needed for rhinitis. 15 mL 5   ipratropium-albuterol (DUONEB) 0.5-2.5 (3) MG/3ML SOLN Use 1 vial via nebulizer every 4-6 hours as needed for cough, wheeze, shortness of breath or chest tightness 180 mL 1   levocetirizine (XYZAL) 5 MG tablet Xyzal 5 mg tablet  Take 1 tablet every day by oral route.     losartan (COZAAR) 25 MG tablet Take 1 tablet (25 mg total) by mouth in the morning and at bedtime. 180 tablet 3   pantoprazole (PROTONIX) 40 MG tablet 20 mg daily.     rivaroxaban (XARELTO) 10 MG TABS tablet Take 1 tablet (10 mg total) by mouth daily. 14 tablet 0   senna (SENOKOT) 8.6  MG TABS tablet Take 2 tablets (17.2 mg total) by mouth 2 (two) times daily. 30 tablet 0   Sodium Chloride-Sodium Bicarb 1.57 g PACK Place into the nose.     SYMBICORT 160-4.5 MCG/ACT inhaler INHALE 2 PUFFS INTO THE LUNGS TWICE DAILY 10 g 5   XOLAIR 150 MG injection Inject into the skin. On hold for now- 12/19/2020     pravastatin (PRAVACHOL) 80 MG tablet Take 1 tablet (80 mg total) by mouth at bedtime. 90 tablet 3   Current Facility-Administered Medications  Medication Dose Route Frequency Provider Last Rate Last Admin   omalizumab Arvid Right) injection 375 mg  375 mg Subcutaneous Q14 Days Kennith Gain, MD   375 mg at 11/24/20 0830     Known medication allergies: Allergies  Allergen Reactions   Amlodipine Shortness Of Breath and Swelling   Bee Venom Anaphylaxis and Hives   Lisinopril Itching and Cough    Dry cough and severe itching in the throat    Other Other (See Comments)    Mosquito bites cause hives all over body   Aspirin Other (See Comments)    Causes stomach problems   Erythromycin Other (See Comments)    Ate the lining of her stomach   Hctz [Hydrochlorothiazide]     Rash    Penicillins Rash    Has patient had a PCN reaction causing immediate rash, facial/tongue/throat swelling, SOB or lightheadedness with hypotension: Yes Has patient had a PCN reaction causing severe rash involving mucus membranes or skin necrosis: Yes Has patient had a PCN reaction that required hospitalization: No  Has patient had a PCN reaction occurring within the last 10 years: No If all of the above answers are "NO", then may proceed with Cephalosporin use.      Physical examination: Blood pressure 122/62, pulse 100, temperature 98.2 F (36.8 C), temperature source Temporal, resp. rate 18, SpO2 100 %.  General: Alert, interactive, in no acute distress. HEENT: PERRLA, TMs pearly gray, turbinates moderately edematous with clear discharge, post-pharynx non erythematous. Neck: Supple  without lymphadenopathy. Lungs: Mildly decreased breath sounds with rhonchi in all fields . {no increased work of breathing. CV: Normal S1, S2 without murmurs. Abdomen: Nondistended, nontender. Skin: Warm and dry, without lesions or rashes. Extremities:  No clubbing, cyanosis or edema. Neuro:   Grossly intact.  Diagnositics/Labs:  Spirometry: FEV1: 1.1 L 60%, FVC: 1.35 L 58% predicted.  This is stable from her previous study  Assessment and plan: Allergic rhinitis with conjunctivitis Vasomotor rhinitis Severe persistent asthma   - continue avoidance measures for grass pollens, weed pollens, tree pollens, mold, dust mites - continue Xyzal 5mg  daily.   - hold your nasal rinses and sprays at this time until you have fully recovered from your concussion/injury.   - recommend  use of MucinexDM to help with loosening/thinning mucus to mobilize it and to help with cough control.  Take 1 tab twice a day as needed with glass of water.  Samples provided.    - have access to albuterol inhaler 2 puffs or albuterol 1 vial via nebulizer every 4-6 hours as needed for cough/wheeze/shortness of breath/chest tightness.  May use 15-20 minutes prior to activity.   Monitor frequency of use.    Use inhaler with spacer device.   - continue Symbicort 154mcg 2 puffs twice a day - use Flovent 132mcg 2 puffs twice a day as needed if not meeting below goals  - recommend use of flutter valve device which helps to loosen/mobilize mucus from chest.  We will see him or we can order this through  Asthma control goals:  Full participation in all desired activities (may need albuterol before activity) Albuterol use two time or less a week on average (not counting use with activity) Cough interfering with sleep two time or less a month Oral steroids no more than once a year No hospitalizations   Follow-up in 4-6 months or sooner if needed  I appreciate the opportunity to take part in Norissa's care. Please do not  hesitate to contact me with questions.  Sincerely,   Prudy Feeler, MD Allergy/Immunology [facility]

## 2021-07-10 NOTE — Patient Instructions (Signed)
°-   continue avoidance measures for grass pollens, weed pollens, tree pollens, mold, dust mites - continue Xyzal 5mg  daily.   - hold your nasal rinses and sprays at this time until you have fully recovered from your concussion/injury.    - recommend use of MucinexDM to help with loosening/thinning mucus to mobilize it and to help with cough control.  Take 1 tab twice a day as needed with glass of water.  Samples provided.     - have access to albuterol inhaler 2 puffs or albuterol 1 vial via nebulizer every 4-6 hours as needed for cough/wheeze/shortness of breath/chest tightness.  May use 15-20 minutes prior to activity.   Monitor frequency of use.    Use inhaler with spacer device.   - continue Symbicort 160mcg 2 puffs twice a day - use Flovent 134mcg 2 puffs twice a day as needed if not meeting below goals  - recommend use of flutter valve device which helps to loosen/mobilize mucus from chest  Asthma control goals:  Full participation in all desired activities (may need albuterol before activity) Albuterol use two time or less a week on average (not counting use with activity) Cough interfering with sleep two time or less a month Oral steroids no more than once a year No hospitalizations   Follow-up in 4-6 months or sooner if needed

## 2021-07-13 NOTE — Progress Notes (Signed)
Benito Mccreedy D.[facility] Phone: [phone]  Assessment and Plan:     1. Concussion without loss of consciousness, initial encounter -Subacute, subsequent visit - Mild improvement in concussion-like symptoms however many symptoms appear to be exacerbated by patient's continued sinus pressure, nosebleeds, congestion, seasonal allergies - Believe that overall patient's concussion symptoms have significantly improved, and agree that she should continue investigation with a visit to ENT which she has made an appointment  2. Ataxia -Subacute, improving - Moderate improvement with vestibular therapy - Continue vestibular therapy  3. Acute post-traumatic headache, not intractable -Subacute, improving, subsequent visit - Likely multifactorial with chronic allergies, sinus pressure, nosebleeds, asthma - Continue follow-up with asthma doctor and establish care ENT    Date of injury was 04/22/2021. Symptom severity scores of 18 and 63 today. Original symptom severity scores were 15 and 60. The patient was counseled on the nature of the injury, typical course and potential options for further evaluation and treatment. Discussed the importance of compliance with recommendations. Patient stated understanding of this plan and willingness to comply.  Recommendations:  -  Complete mental and physical rest for 48 hours after concussive event - Recommend light aerobic activity while keeping symptoms less than 3/10 - Stop mental or physical activities that cause symptoms to worsen greater than 3/10, and wait 24 hours before attempting them again - Eliminate screen time as much as possible for first 48 hours after concussive event, then continue limited screen time (recommend less than 2 hours per day)   - Encouraged to RTC in 2 weeks for reassessment or sooner for any concerns or acute changes   Pertinent previous records reviewed include  none   Time of visit 33 minutes, which included chart review, physical exam, treatment plan, symptom severity score, VOMS, and tandem gait testing being performed, interpreted, and discussed with patient at today's visit.   Subjective:   I, [name], am serving as a Education administrator for Doctor Glennon Mac  Chief Complaint: concussion symptoms   HPI:  06/09/21 Patient is a 66 year old female presenting with concussion like symptoms after a fall on 04/22/21. Patient was seen at the ED on 04/22/21 stating that she tripped outside and hit her face on the ground. Patient has noticed frontal headaches, nose pain, swelling and discoloration around both eyes. Patient was seen back at the ED and eye doctor on 05/25/21 C/O headaches, but was unsure if this was related to sinusitis or her fall. Patient was to continue taking tylenol and to follow up with sports medicine for evaluation and treatment.    06/23/2021 Patient states that she still has a lot of head pains feels like she has bubbles in her ears a lot of nasal pain and sometimes teeth hurt, a lot of neck issues, tried the neck exercises but felt crunchy and decided to leave it alone. Still complaining of eye discomfort has limited screen time and mental activity. Has a rotator cuff strain she learned [facility] will be seeing shoulder doctor next Thursday already has an appointment with shoulder doc    07/14/2021 Patient states that she has been sick. The head pain continues and the illness has made it not better. Has an appointment for ear nose and throat . Has been going to vestibular therapy but its painful    Concussion HPI:  - Injury date: 04/22/21   - Mechanism of injury: fall  - LOC: no  -  Initial evaluation: 04/22/21  - Previous head injuries/concussions: no   - Previous imaging: yes    - Social history: activities include retired  Hospitalization for head injury? No Diagnosed/treated for headache disorder or migraines?  yes Diagnosed with learning disability [name] No Diagnosed with ADD/ADHD? No Diagnose with Depression, anxiety, or other Psychiatric Disorder? No    Current medications:  Current Outpatient Medications  Medication Sig Dispense Refill   Acetaminophen (TYLENOL PO) Take by mouth.     acetaminophen (TYLENOL) 325 MG tablet Take 325 mg by mouth daily as needed for mild pain, fever or headache.     albuterol (PROVENTIL HFA) 108 (90 Base) MCG/ACT inhaler Inhale 2 puffs into the lungs every 4 (four) hours as needed for wheezing or shortness of breath. 18 g 2   azelastine (ASTELIN) 0.1 % nasal spray USE 2 SPRAYS IN EACH NOSTRILS TWICE DAILY. 30 mL 4   Bepotastine Besilate (BEPREVE) 1.5 % SOLN Place 1 drop into both eyes 2 (two) times daily as needed. 10 mL 5   ciprofloxacin (CILOXAN) 0.3 % ophthalmic solution Place 2 drops into both eyes every 2 (two) hours. Administer 1 drop, every 2 hours, while awake, for 2 days. Then 1 drop, every 4 hours, while awake, for the next 5 days. 5 mL 0   cromolyn (OPTICROM) 4 % ophthalmic solution Place 1 drop into both eyes every 6 (six) hours as needed. 10 mL 5   diphenhydrAMINE (BENADRYL) 25 MG tablet Take 25 mg by mouth every 6 (six) hours as needed.     EPINEPHrine 0.3 mg/0.3 mL IJ SOAJ injection Inject 0.3 mg into the muscle as needed for anaphylaxis. 2 each 1   fluticasone (FLOVENT HFA) 110 MCG/ACT inhaler Inhale two puffs twice daily if needed to prevent cough or wheeze.  Rinse, gargle, and spit after use. 12 g 5   ipratropium (ATROVENT) 0.06 % nasal spray Place 2 sprays into both nostrils every 4 (four) hours as needed for rhinitis. 15 mL 5   ipratropium-albuterol (DUONEB) 0.5-2.5 (3) MG/3ML SOLN Use 1 vial via nebulizer every 4-6 hours as needed for cough, wheeze, shortness of breath or chest tightness 180 mL 1   levocetirizine (XYZAL) 5 MG tablet Xyzal 5 mg tablet  Take 1 tablet every day by oral route.     losartan (COZAAR) 25 MG tablet Take 1 tablet (25  mg total) by mouth in the morning and at bedtime. 180 tablet 3   pantoprazole (PROTONIX) 40 MG tablet 20 mg daily.     pravastatin (PRAVACHOL) 80 MG tablet Take 1 tablet (80 mg total) by mouth at bedtime. 90 tablet 3   rivaroxaban (XARELTO) 10 MG TABS tablet Take 1 tablet (10 mg total) by mouth daily. 14 tablet 0   senna (SENOKOT) 8.6 MG TABS tablet Take 2 tablets (17.2 mg total) by mouth 2 (two) times daily. 30 tablet 0   Sodium Chloride-Sodium Bicarb 1.57 g PACK Place into the nose.     SYMBICORT 160-4.5 MCG/ACT inhaler INHALE 2 PUFFS INTO THE LUNGS TWICE DAILY 10 g 5   XOLAIR 150 MG injection Inject into the skin. On hold for now- 12/19/2020     Current Facility-Administered Medications  Medication Dose Route Frequency Provider Last Rate Last Admin   omalizumab Arvid Right) injection 375 mg  375 mg Subcutaneous Q14 Days Kennith Gain, MD   375 mg at 11/24/20 0830      Objective:     Vitals:   07/14/21 0746  BP: 120/70  Pulse: 87  SpO2: 99%  Weight: 178 lb (80.7 kg)  Height: 4\' 11"  (1.499 m)      Body mass index is 35.95 kg/m.    Physical Exam:     General: Well-appearing, cooperative, sitting comfortably in no acute distress.  Psychiatric: Mood and affect are appropriate.     Today's Symptom Severity Score:  Scores: 0-6  Headache:6 "Pressure in head":6  Neck Pain:3  Nausea or vomiting:2  Dizziness:2  Blurred vision:2  Balance problems:4  Sensitivity to light:6  Sensitivity to noise:5  Feeling slowed down:3  Feeling like in a fog:2  Dont feel right:6  Difficulty concentrating:4  Difficulty remembering:2  Fatigue or low energy:5  Confusion:2  Drowsiness:5  More emotional:0  Irritability:3  Sadness:0  Nervous or Anxious:0  Trouble falling asleep:0   Total number of symptoms: 18/22  Symptom Severity index: 63/132  Worse with physical activity? Yes  Worse with mental activity? Yes  Percent improved since injury: 3%    Full pain-free  cervical PROM: yes    Tandem gait: - Forward, eyes open: 0 errors - Backward, eyes open: 1 errors - Forward, eyes closed: 1 errors - Backward, eyes closed: 3 errors  VOMS:   - Baseline symptoms: 0 - Vertical Vestibular-Ocular Reflex: Head pressure 2/10  - Horizontal Vestibular-Ocular Reflex: 0/10  - Smooth pursuits: 0/10  - Vertical Saccades: Head pressure 2/10  - Horizontal Saccades:  0/10  - Visual Motion Sensitivity Test: Dizzy 2/10  - Convergence: 8,8cm (<5 cm normal)     Electronically signed by:  Benito Mccreedy D.[facility] 8:31 AM 07/14/21

## 2021-07-14 ENCOUNTER — Other Ambulatory Visit: Payer: Self-pay

## 2021-07-14 ENCOUNTER — Ambulatory Visit (INDEPENDENT_AMBULATORY_CARE_PROVIDER_SITE_OTHER): Payer: Medicare Other | Admitting: Sports Medicine

## 2021-07-14 VITALS — BP 120/70 | HR 87 | Ht 59.0 in | Wt 178.0 lb

## 2021-07-14 DIAGNOSIS — S060X0A Concussion without loss of consciousness, initial encounter: Secondary | ICD-10-CM

## 2021-07-14 DIAGNOSIS — R270 Ataxia, unspecified: Secondary | ICD-10-CM | POA: Diagnosis not present

## 2021-07-14 DIAGNOSIS — G44319 Acute post-traumatic headache, not intractable: Secondary | ICD-10-CM

## 2021-07-14 NOTE — Patient Instructions (Addendum)
Good to see you  2 week follow

## 2021-07-27 NOTE — Progress Notes (Signed)
Kendra Gilbert D.[place] Phone: [phone]  Assessment and Plan:     1. Concussion without loss of consciousness, subsequent encounter -Chronic, minimal improvement, subsequent visit - Minimal change in concussion-like symptoms however complicated by patient's continued sinus pressure, congestion, seasonal allergies - Patient has appointment with ENT next week.  Plan to follow-up with patient 2 weeks after ENT visit to see if there treatment recommendations will help alleviate patient's symptoms -CT head x2 since injury have been unremarkable  2. Ataxia -Chronic, improving - Continued improvement with vestibular therapy - Continue vestibular therapy  3. Acute post-traumatic headache, not intractable -Chronic, minimal improvement, subsequent visit - Minimal change in concussion-like symptoms however complicated by patient's continued sinus pressure, congestion, seasonal allergies - Patient has appointment with ENT next week.  Plan to follow-up with patient 2 weeks after ENT visit to see if there treatment recommendations will help alleviate patient's symptoms    Date of injury was 04/22/2021. Symptom severity scores of 19 and 74 today. Original symptom severity scores were 15 and 60. The patient was counseled on the nature of the injury, typical course and potential options for further evaluation and treatment. Discussed the importance of compliance with recommendations. Patient stated understanding of this plan and willingness to comply.  Recommendations:  -  Complete mental and physical rest for 48 hours after concussive event - Recommend light aerobic activity while keeping symptoms less than 3/10 - Stop mental or physical activities that cause symptoms to worsen greater than 3/10, and wait 24 hours before attempting them again - Eliminate screen time as much as possible for first 48 hours after concussive event, then  continue limited screen time (recommend less than 2 hours per day)   - Encouraged to RTC in 3 weeks for reassessment or sooner for any concerns or acute changes   Pertinent previous records reviewed include none   Time of visit 31 minutes, which included chart review, physical exam, treatment plan, symptom severity score, VOMS, and tandem gait testing being performed, interpreted, and discussed with patient at today's visit.   Subjective:   I, Kendra Gilbert, am serving as a Education administrator for Kendra Gilbert  Chief Complaint: concussion symptoms   HPI:  06/09/21 Patient is a 66 year old female presenting with concussion like symptoms after a fall on 04/22/21. Patient was seen at the ED on 04/22/21 stating that she tripped outside and hit her face on the ground. Patient has noticed frontal headaches, nose pain, swelling and discoloration around both eyes. Patient was seen back at the ED and eye Kendra on 05/25/21 C/O headaches, but was unsure if this was related to sinusitis or her fall. Patient was to continue taking tylenol and to follow up with sports medicine for evaluation and treatment.    06/23/2021 Patient states that she still has a lot of head pains feels like she has bubbles in her ears a lot of nasal pain and sometimes teeth hurt, a lot of neck issues, tried the neck exercises but felt crunchy and decided to leave it alone. Still complaining of eye discomfort has limited screen time and mental activity. Has a rotator cuff strain she learned from [facility] will be seeing shoulder Kendra next Thursday already has an appointment with shoulder doc    07/14/2021 Patient states that she has been sick. The head pain continues and the illness has made it not better. Has an appointment for ear nose and throat .  Has been going to vestibular therapy but its painful   07/28/2021 Patient states that she is  hanging in there , the head is not good at all. Vestibular was good was having neck  issues and that seemed to help just so sensitive all the head pain is on the left side , seeing ear nose and throat next Wednesday . Ever since the fall body has become hypersensitive   Concussion HPI:  - Injury date: 04/22/21   - Mechanism of injury: fall  - LOC: no  - Initial evaluation: 04/22/21  - Previous head injuries/concussions: no   - Previous imaging: yes    - Social history: activities include retired  Hospitalization for head injury? No Diagnosed/treated for headache disorder or migraines? yes Diagnosed with learning disability Kendra Gilbert? No Diagnosed with ADD/ADHD? No Diagnose with Depression, anxiety, or other Psychiatric Disorder? No     Current medications:  Current Outpatient Medications  Medication Sig Dispense Refill   Acetaminophen (TYLENOL PO) Take by mouth.     acetaminophen (TYLENOL) 325 MG tablet Take 325 mg by mouth daily as needed for mild pain, fever or headache.     albuterol (PROVENTIL HFA) 108 (90 Base) MCG/ACT inhaler Inhale 2 puffs into the lungs every 4 (four) hours as needed for wheezing or shortness of breath. 18 g 2   azelastine (ASTELIN) 0.1 % nasal spray USE 2 SPRAYS IN EACH NOSTRILS TWICE DAILY. 30 mL 4   Bepotastine Besilate (BEPREVE) 1.5 % SOLN Place 1 drop into both eyes 2 (two) times daily as needed. 10 mL 5   ciprofloxacin (CILOXAN) 0.3 % ophthalmic solution Place 2 drops into both eyes every 2 (two) hours. Administer 1 drop, every 2 hours, while awake, for 2 days. Then 1 drop, every 4 hours, while awake, for the next 5 days. 5 mL 0   cromolyn (OPTICROM) 4 % ophthalmic solution Place 1 drop into both eyes every 6 (six) hours as needed. 10 mL 5   diphenhydrAMINE (BENADRYL) 25 MG tablet Take 25 mg by mouth every 6 (six) hours as needed.     EPINEPHrine 0.3 mg/0.3 mL IJ SOAJ injection Inject 0.3 mg into the muscle as needed for anaphylaxis. 2 each 1   fluticasone (FLOVENT HFA) 110 MCG/ACT inhaler Inhale two puffs twice daily if needed to prevent  cough or wheeze.  Rinse, gargle, and spit after use. 12 g 5   ipratropium (ATROVENT) 0.06 % nasal spray Place 2 sprays into both nostrils every 4 (four) hours as needed for rhinitis. 15 mL 5   ipratropium-albuterol (DUONEB) 0.5-2.5 (3) MG/3ML SOLN Use 1 vial via nebulizer every 4-6 hours as needed for cough, wheeze, shortness of breath or chest tightness 180 mL 1   levocetirizine (XYZAL) 5 MG tablet Xyzal 5 mg tablet  Take 1 tablet every day by oral route.     losartan (COZAAR) 25 MG tablet Take 1 tablet (25 mg total) by mouth in the morning and at bedtime. 180 tablet 3   pantoprazole (PROTONIX) 40 MG tablet 20 mg daily.     pravastatin (PRAVACHOL) 80 MG tablet Take 1 tablet (80 mg total) by mouth at bedtime. 90 tablet 3   rivaroxaban (XARELTO) 10 MG TABS tablet Take 1 tablet (10 mg total) by mouth daily. 14 tablet 0   senna (SENOKOT) 8.6 MG TABS tablet Take 2 tablets (17.2 mg total) by mouth 2 (two) times daily. 30 tablet 0   Sodium Chloride-Sodium Bicarb 1.57 g PACK Place into the nose.  SYMBICORT 160-4.5 MCG/ACT inhaler INHALE 2 PUFFS INTO THE LUNGS TWICE DAILY 10 g 5   XOLAIR 150 MG injection Inject into the skin. On hold for now- 12/19/2020     Current Facility-Administered Medications  Medication Dose Route Frequency Provider Last Rate Last Admin   omalizumab Arvid Right) injection 375 mg  375 mg Subcutaneous Q14 Days Kennith Gain, MD   375 mg at 11/24/20 0830      Objective:     Vitals:   07/28/21 0751  BP: 118/80  Pulse: 69  SpO2: 93%  Weight: 176 lb (79.8 kg)  Height: 4\' 11"  (1.499 m)      Body mass index is 35.55 kg/m.    Physical Exam:     General: Well-appearing, cooperative, sitting comfortably in no acute distress.  Psychiatric: Mood and affect are appropriate.     Today's Symptom Severity Score:  Scores: 0-6  Headache:6 "Pressure in head":6  Neck Pain:6  Nausea or vomiting:2  Dizziness:3  Blurred vision:3  Balance problems:3  Sensitivity to  light:6  Sensitivity to noise:6  Feeling slowed down:4  Feeling like in a fog:3  Dont feel right:6  Difficulty concentrating:2  Difficulty remembering:2  Fatigue or low energy:5  Confusion:1  Drowsiness:4  More emotional:0  Irritability:2  Sadness:0  Nervous or Anxious:0  Trouble falling asleep:4   Total number of symptoms: 19/22  Symptom Severity index: 74/132  Worse with physical activity? yes Worse with mental activity? Yes  Percent improved since injury: 50%    Full pain-free cervical PROM: yes    Tandem gait: - Forward, eyes open: 0 errors - Backward, eyes open: 1 errors - Forward, eyes closed: 4 errors - Backward, eyes closed: 3 errors  VOMS:   - Baseline symptoms: 0 - Horizontal Vestibular-Ocular Reflex: 0/10  - Vertical Vestibular-Ocular Reflex: Eye pressure 2/10  - Smooth pursuits: 0/10  - Horizontal Saccades: Eye pressure 3/10  - Vertical Saccades: Left-sided headache/10  - Visual Motion Sensitivity Test: Dizzy 3/10  - Convergence: 9, 9 cm (<5 cm normal)     Electronically signed by:  Kendra Gilbert D.[facility] 8:17 AM 07/28/21

## 2021-07-28 ENCOUNTER — Other Ambulatory Visit: Payer: Self-pay

## 2021-07-28 ENCOUNTER — Ambulatory Visit (INDEPENDENT_AMBULATORY_CARE_PROVIDER_SITE_OTHER): Payer: Medicare Other | Admitting: Sports Medicine

## 2021-07-28 VITALS — BP 118/80 | HR 69 | Ht 59.0 in | Wt 176.0 lb

## 2021-07-28 DIAGNOSIS — S060X0A Concussion without loss of consciousness, initial encounter: Secondary | ICD-10-CM | POA: Diagnosis not present

## 2021-07-28 DIAGNOSIS — R270 Ataxia, unspecified: Secondary | ICD-10-CM | POA: Diagnosis not present

## 2021-07-28 DIAGNOSIS — G44319 Acute post-traumatic headache, not intractable: Secondary | ICD-10-CM

## 2021-07-28 DIAGNOSIS — S060X0D Concussion without loss of consciousness, subsequent encounter: Secondary | ICD-10-CM

## 2021-07-28 NOTE — Patient Instructions (Addendum)
Good to see you 3 week follow up  

## 2021-08-05 DIAGNOSIS — J329 Chronic sinusitis, unspecified: Secondary | ICD-10-CM | POA: Insufficient documentation

## 2021-08-11 NOTE — Progress Notes (Signed)
Kendra Gilbert D.Kendra Gilbert Phone: [phone]  Assessment and Plan:     1. Concussion without loss of consciousness, subsequent encounter -Chronic, unchanged, subsequent visit - Essentially no change in left-sided headaches, sinus pressure since previous office visit which included establishing care with ENT and completion of 2-week course of antibiotic cefdinir - Patient's symptom score has remained essentially unchanged and her physical exam has remained consistent for the past several office visits.  At this point I am not sure if any remaining symptoms are truly concussion related - CT head x2 since injury have been unremarkable and patient plans to have additional imaging with ENT to further evaluate chronic sinus pressure  2. Ataxia -Chronic, improving - Patient feels she is still receiving some benefit from vestibular therapy with most benefit coming from neck physical therapy - Continue vestibular therapy  3. Acute post-traumatic headache, not intractable -Chronic, unchanged, subsequent visit - Continue work-up with ENT - If no improvement at follow-up visit in 2 weeks, and unremarkable work-up with ENT, could consider starting amitriptyline   Date of injury was 04/22/2021. Symptom severity scores of 18 and 70 today. Original symptom severity scores were 15 and 60. The patient was counseled on the nature of the injury, typical course and potential options for further evaluation and treatment. Discussed the importance of compliance with recommendations. Patient stated understanding of this plan and willingness to comply.  Recommendations:  -  Complete mental and physical rest for 48 hours after concussive event - Recommend light aerobic activity while keeping symptoms less than 3/10 - Stop mental or physical activities that cause symptoms to worsen greater than 3/10, and wait 24 hours before attempting them  again - Eliminate screen time as much as possible for first 48 hours after concussive event, then continue limited screen time (recommend less than 2 hours per day)   - Encouraged to RTC in 2 weeks for reassessment or sooner for any concerns or acute changes   Pertinent previous records reviewed include ENT visit from 08/05/2021   Time of visit 37 minutes, which included chart review, physical exam, treatment plan, symptom severity score, VOMS, and tandem gait testing being performed, interpreted, and discussed with patient at today's visit.   Subjective:   I, Kendra Gilbert, am serving as a Education administrator for Kendra Gilbert  Chief Complaint: concussion symptoms   HPI:  06/09/21 Patient is a 66 year old female presenting with concussion like symptoms after a fall on 04/22/21. Patient was seen at the ED on 04/22/21 stating that she tripped outside and hit her face on the ground. Patient has noticed frontal headaches, nose pain, swelling and discoloration around both eyes. Patient was seen back at the ED and eye Kendra on 05/25/21 C/O headaches, but was unsure if this was related to sinusitis or her fall. Patient was to continue taking tylenol and to follow up with sports medicine for evaluation and treatment.    06/23/2021 Patient states that she still has a lot of head pains feels like she has bubbles in her ears a lot of nasal pain and sometimes teeth hurt, a lot of neck issues, tried the neck exercises but felt crunchy and decided to leave it alone. Still complaining of eye discomfort has limited screen time and mental activity. Has a rotator cuff strain she learned from [facility] will be seeing shoulder Kendra next Thursday already has an appointment with shoulder doc    07/14/2021 Patient  states that she has been sick. The head pain continues and the illness has made it not better. Has an appointment for ear nose and throat . Has been going to vestibular therapy but its painful    07/28/2021 Patient states that she is  hanging in there , the head is not good at all. Vestibular was good was having neck issues and that seemed to help just so sensitive all the head pain is on the left side , seeing ear nose and throat next Wednesday . Ever since the fall body has become hypersensitive   08/18/2021 Patient states has a antibiotic and will have a CAT scan from ENT, has been going to PT and they are working on her neck pain , and her eyes since they eyes and neck work hand in hand, had MRI on L shoulder yesterday , head still isn't doing well, a lot of head pain on the left side sometimes middle of the forehead but mostly on the left side with the left eye  , pressure gets bad when she is reading or doing computer work , this morning her eye started acting up when she inhaled to much or when she steps outside . She states that she feels the same    Concussion HPI:  - Injury date: 04/22/21   - Mechanism of injury: fall  - LOC: no  - Initial evaluation: 04/22/21  - Previous head injuries/concussions: no   - Previous imaging: yes    - Social history: activities include retired  Hospitalization for head injury? No Diagnosed/treated for headache disorder or migraines? yes Diagnosed with learning disability Kendra Gilbert? No Diagnosed with ADD/ADHD? No Diagnose with Depression, anxiety, or other Psychiatric Disorder? No   Current medications:  Current Outpatient Medications  Medication Sig Dispense Refill   Acetaminophen (TYLENOL PO) Take by mouth.     acetaminophen (TYLENOL) 325 MG tablet Take 325 mg by mouth daily as needed for mild pain, fever or headache.     albuterol (PROVENTIL HFA) 108 (90 Base) MCG/ACT inhaler Inhale 2 puffs into the lungs every 4 (four) hours as needed for wheezing or shortness of breath. 18 g 2   azelastine (ASTELIN) 0.1 % nasal spray USE 2 SPRAYS IN EACH NOSTRILS TWICE DAILY. 30 mL 4   Bepotastine Besilate (BEPREVE) 1.5 % SOLN Place 1 drop into both  eyes 2 (two) times daily as needed. 10 mL 5   cefdinir (OMNICEF) 300 MG capsule Take 300 mg by mouth 2 (two) times daily.     ciprofloxacin (CILOXAN) 0.3 % ophthalmic solution Place 2 drops into both eyes every 2 (two) hours. Administer 1 drop, every 2 hours, while awake, for 2 days. Then 1 drop, every 4 hours, while awake, for the next 5 days. 5 mL 0   cromolyn (OPTICROM) 4 % ophthalmic solution Place 1 drop into both eyes every 6 (six) hours as needed. 10 mL 5   diphenhydrAMINE (BENADRYL) 25 MG tablet Take 25 mg by mouth every 6 (six) hours as needed.     EPINEPHrine 0.3 mg/0.3 mL IJ SOAJ injection Inject 0.3 mg into the muscle as needed for anaphylaxis. 2 each 1   fluticasone (FLOVENT HFA) 110 MCG/ACT inhaler Inhale two puffs twice daily if needed to prevent cough or wheeze.  Rinse, gargle, and spit after use. 12 g 5   ipratropium (ATROVENT) 0.06 % nasal spray Place 2 sprays into both nostrils every 4 (four) hours as needed for rhinitis. 15 mL 5   ipratropium-albuterol (  DUONEB) 0.5-2.5 (3) MG/3ML SOLN Use 1 vial via nebulizer every 4-6 hours as needed for cough, wheeze, shortness of breath or chest tightness 180 mL 1   levocetirizine (XYZAL) 5 MG tablet Xyzal 5 mg tablet  Take 1 tablet every day by oral route.     losartan (COZAAR) 25 MG tablet Take 1 tablet (25 mg total) by mouth in the morning and at bedtime. 180 tablet 3   pantoprazole (PROTONIX) 40 MG tablet 20 mg daily.     rivaroxaban (XARELTO) 10 MG TABS tablet Take 1 tablet (10 mg total) by mouth daily. 14 tablet 0   senna (SENOKOT) 8.6 MG TABS tablet Take 2 tablets (17.2 mg total) by mouth 2 (two) times daily. 30 tablet 0   Sodium Chloride-Sodium Bicarb 1.57 g PACK Place into the nose.     SYMBICORT 160-4.5 MCG/ACT inhaler INHALE 2 PUFFS INTO THE LUNGS TWICE DAILY 10 g 5   XOLAIR 150 MG injection Inject into the skin. On hold for now- 12/19/2020     pravastatin (PRAVACHOL) 80 MG tablet Take 1 tablet (80 mg total) by mouth at bedtime. 90  tablet 3   Current Facility-Administered Medications  Medication Dose Route Frequency Provider Last Rate Last Admin   omalizumab Arvid Right) injection 375 mg  375 mg Subcutaneous Q14 Days Kennith Gain, MD   375 mg at 11/24/20 0830      Objective:     Vitals:   08/18/21 0810  BP: 132/80  Pulse: 77  SpO2: 95%  Weight: 181 lb (82.1 kg)  Height: 4\' 11"  (1.499 m)      Body mass index is 36.56 kg/m.    Physical Exam:     General: Well-appearing, cooperative, sitting comfortably in no acute distress.  Psychiatric: Mood and affect are appropriate.     Today's Symptom Severity Score:  Scores: 0-6  Headache:6 "Pressure in head":6  Neck Pain:5  Nausea or vomiting:5  Dizziness:3  Blurred vision:2  Balance problems:4  Sensitivity to light:6  Sensitivity to noise:6  Feeling slowed down:4  Feeling like "in a fog":2  "Don't feel right":3  Difficulty concentrating:3  Difficulty remembering:4  Fatigue or low energy:3  Confusion:0  Drowsiness:2  More emotional:0  Irritability:2  Sadness:0  Nervous or Anxious:0  Trouble falling asleep:3   Total number of symptoms: 18/22  Symptom Severity index: 70/132  Worse with physical activity? Yes Worse with mental activity? Yes  Percent improved since injury: 60%    Full pain-free cervical PROM: yes    Tandem gait: - Forward, eyes open: 4 errors - Backward, eyes open: 4 errors - Forward, eyes closed: 3 errors - Backward, eyes closed: 3 errors  VOMS:   - Baseline symptoms: 0 - Horizontal Vestibular-Ocular Reflex: 0/10  - Vertical Vestibular-Ocular Reflex: Dizzy 2/10  - Smooth pursuits: Left eye pressure 3/10  - Horizontal Saccades:  0/10  - Vertical Saccades: 0/10  - Visual Motion Sensitivity Test: Dizzy 3/10  - Convergence: 9, 9 cm (<5 cm normal)     Electronically signed by:  Kendra Gilbert D.[facility] 8:40 AM 08/18/21

## 2021-08-18 ENCOUNTER — Ambulatory Visit (INDEPENDENT_AMBULATORY_CARE_PROVIDER_SITE_OTHER): Payer: Medicare Other | Admitting: Sports Medicine

## 2021-08-18 ENCOUNTER — Other Ambulatory Visit: Payer: Self-pay

## 2021-08-18 VITALS — BP 132/80 | HR 77 | Ht 59.0 in | Wt 181.0 lb

## 2021-08-18 DIAGNOSIS — G44319 Acute post-traumatic headache, not intractable: Secondary | ICD-10-CM | POA: Diagnosis not present

## 2021-08-18 DIAGNOSIS — S060X0D Concussion without loss of consciousness, subsequent encounter: Secondary | ICD-10-CM

## 2021-08-18 DIAGNOSIS — R270 Ataxia, unspecified: Secondary | ICD-10-CM

## 2021-08-18 NOTE — Patient Instructions (Addendum)
Good to see you 2 week follow up  

## 2021-08-22 DIAGNOSIS — M7542 Impingement syndrome of left shoulder: Secondary | ICD-10-CM | POA: Insufficient documentation

## 2021-08-22 DIAGNOSIS — M19019 Primary osteoarthritis, unspecified shoulder: Secondary | ICD-10-CM | POA: Insufficient documentation

## 2021-08-27 ENCOUNTER — Telehealth: Payer: Self-pay | Admitting: *Deleted

## 2021-08-27 NOTE — Progress Notes (Signed)
Kendra Gilbert Kendra Gilbert [place] Phone: [phone]  Assessment and Plan:     1. Concussion without loss of consciousness, subsequent encounter -Chronic, unchanged, subsequent visit - Essentially no change in left-sided headaches, sinus pressure, neck soreness - Patient had an office visit with ENT who states that they did not feel that her symptoms were sinus related after completion of multiple antibiotics and review of unremarkable CTs - CT head x2 since injury been unremarkable - At this point patient may be experiencing postconcussion symptoms versus chronic musculoskeletal conditions leading to headaches  2. Ataxia -Chronic, improving - Patient feels that she is still receiving benefit from vestibular therapy including neck exercises - Continue vestibular therapy  3. Acute post-traumatic headache, not intractable -Chronic, unchanged, subsequent visit - Patient had an office visit with ENT who states that they did not feel that her symptoms were sinus related after completion of multiple antibiotics and review of unremarkable CTs - CT head x2 since injury been unremarkable - At this point patient may be experiencing postconcussion symptoms versus chronic musculoskeletal conditions leading to headaches - Recommend that patient continue with orthopedic surgery to have shoulder operation.  If patient's shoulder pain and ROM improved, may decrease tension in patient's neck and trapezius which could in turn decrease patient's left-sided headaches - Recommend the patient follow-up 2 to 3 weeks after shoulder surgery.  The patient is still experiencing headaches despite improvement in shoulder and neck pain, could consider amitriptyline at that time   Date of injury was 04/22/2021. Symptom severity scores of 17 and 64 today. Original symptom severity scores were 15 and 60. The patient was counseled on the nature of the injury, typical  course and potential options for further evaluation and treatment. Discussed the importance of compliance with recommendations. Patient stated understanding of this plan and willingness to comply.  Recommendations:  -  Complete mental and physical rest for 48 hours after concussive event - Recommend light aerobic activity while keeping symptoms less than 3/10 - Stop mental or physical activities that cause symptoms to worsen greater than 3/10, and wait 24 hours before attempting them again - Eliminate screen time as much as possible for first 48 hours after concussive event, then continue limited screen time (recommend less than 2 hours per day)     Pertinent previous records reviewed include ENT note 08/26/2021   Time of visit 34 minutes, which included chart review, physical exam, treatment plan, symptom severity score, VOMS, and tandem gait testing being performed, interpreted, and discussed with patient at today's visit.   Subjective:   I, [name], am serving as a Education administrator for Doctor Glennon Mac  Chief Complaint: concussion symptoms   HPI:  06/09/21 Patient is a 66 year old female presenting with concussion like symptoms after a fall on 04/22/21. Patient was seen at the ED on 04/22/21 stating that she tripped outside and hit her face on the ground. Patient has noticed frontal headaches, nose pain, swelling and discoloration around both eyes. Patient was seen back at the ED and eye doctor on 05/25/21 C/O headaches, but was unsure if this was related to sinusitis or her fall. Patient was to continue taking tylenol and to follow up with sports medicine for evaluation and treatment.    06/23/2021 Patient states that she still has a lot of head pains feels like she has bubbles in her ears a lot of nasal pain and sometimes teeth hurt, a lot of  neck issues, tried the neck exercises but felt crunchy and decided to leave it alone. Still complaining of eye discomfort has limited screen  time and mental activity. Has a rotator cuff strain she learned from [facility] will be seeing shoulder doctor next Thursday already has an appointment with shoulder doc    07/14/2021 Patient states that she has been sick. The head pain continues and the illness has made it not better. Has an appointment for ear nose and throat . Has been going to vestibular therapy but its painful   07/28/2021 Patient states that she is  hanging in there , the head is not good at all. Vestibular was good was having neck issues and that seemed to help just so sensitive all the head pain is on the left side , seeing ear nose and throat next Wednesday . Ever since the fall body has become hypersensitive   08/18/2021 Patient states has a antibiotic and will have a CAT scan from ENT, has been going to PT and they are working on her neck pain , and her eyes since they eyes and neck work hand in hand, had MRI on L shoulder yesterday , head still isn't doing well, a lot of head pain on the left side sometimes middle of the forehead but mostly on the left side with the left eye  , pressure gets bad when she is reading or doing computer work , this morning her eye started acting up when she inhaled to much or when she steps outside . 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 No Diagnosed/treated for headache disorder or migraines? yes Diagnosed with learning disability Kendra Gilbert?  No Diagnosed with ADD/ADHD? No Diagnose with Depression, anxiety, or other Psychiatric Disorder? No   Current medications:  Current Outpatient Medications  Medication Sig Dispense Refill   Acetaminophen (TYLENOL PO) Take by mouth.     acetaminophen (TYLENOL) 325 MG tablet Take 325 mg by mouth daily as needed for mild pain, fever or headache.     albuterol (PROVENTIL HFA) 108 (90 Base) MCG/ACT inhaler Inhale 2 puffs into the lungs every 4 (four) hours as needed for wheezing or shortness of breath. 18 g 2   azelastine (ASTELIN) 0.1 % nasal spray USE 2 SPRAYS IN EACH NOSTRILS TWICE DAILY. 30 mL 4   Bepotastine Besilate (BEPREVE) 1.5 % SOLN Place 1 drop into both eyes 2 (two) times daily as needed. 10 mL 5   ciprofloxacin (CILOXAN) 0.3 % ophthalmic solution Place 2 drops into both eyes every 2 (two) hours. Administer 1 drop, every 2 hours, while awake, for 2 days. Then 1 drop, every 4 hours, while awake, for the next 5 days. 5 mL 0   cromolyn (OPTICROM) 4 % ophthalmic solution Place 1 drop into both eyes every 6 (six) hours as needed. 10 mL 5   diphenhydrAMINE (BENADRYL) 25 MG tablet Take 25 mg by  mouth every 6 (six) hours as needed.     EPINEPHrine 0.3 mg/0.3 mL IJ SOAJ injection Inject 0.3 mg into the muscle as needed for anaphylaxis. 2 each 1   fluticasone (FLOVENT HFA) 110 MCG/ACT inhaler Inhale two puffs twice daily if needed to prevent cough or wheeze.  Rinse, gargle, and spit after use. 12 g 5   ipratropium (ATROVENT) 0.06 % nasal spray Place 2 sprays into both nostrils every 4 (four) hours as needed for rhinitis. 15 mL 5   ipratropium-albuterol (DUONEB) 0.5-2.5 (3) MG/3ML SOLN Use 1 vial via nebulizer every 4-6 hours as needed for cough, wheeze, shortness of breath or chest tightness 180 mL 1   levocetirizine (XYZAL) 5 MG tablet Xyzal 5 mg tablet  Take 1 tablet every day by oral route.     losartan (COZAAR) 25 MG tablet Take 1 tablet (25 mg total) by mouth in the morning and at bedtime. 180  tablet 3   pantoprazole (PROTONIX) 40 MG tablet 20 mg daily.     rivaroxaban (XARELTO) 10 MG TABS tablet Take 1 tablet (10 mg total) by mouth daily. 14 tablet 0   senna (SENOKOT) 8.6 MG TABS tablet Take 2 tablets (17.2 mg total) by mouth 2 (two) times daily. 30 tablet 0   Sodium Chloride-Sodium Bicarb 1.57 g PACK Place into the nose.     SYMBICORT 160-4.5 MCG/ACT inhaler INHALE 2 PUFFS INTO THE LUNGS TWICE DAILY 10 g 5   XOLAIR 150 MG injection Inject into the skin. On hold for now- 12/19/2020     pravastatin (PRAVACHOL) 80 MG tablet Take 1 tablet (80 mg total) by mouth at bedtime. 90 tablet 3   Current Facility-Administered Medications  Medication Dose Route Frequency Provider Last Rate Last Admin   omalizumab Arvid Right) injection 375 mg  375 mg Subcutaneous Q14 Days Kennith Gain, MD   375 mg at 11/24/20 0830      Objective:     Vitals:   08/28/21 0750  BP: 132/80  Pulse: 73  SpO2: 97%  Weight: 179 lb (81.2 kg)  Height: 4\' 11"  (1.499 m)      Body mass index is 36.15 kg/m.    Physical Exam:     General: Well-appearing, cooperative, sitting comfortably in no acute distress.  Psychiatric: Mood and affect are appropriate.     Today's Symptom Severity Score:  Scores: 0-6  Headache:6 "Pressure in head":6  Neck Pain:5  Nausea or vomiting:0  Dizziness:6  Blurred vision:4  Balance problems:3  Sensitivity to light:6  Sensitivity to noise:6  Feeling slowed down:2  Feeling like in a fog:2  Dont feel right:1  Difficulty concentrating:4  Difficulty remembering:3  Fatigue or low energy:4  Confusion:0  Drowsiness:3  More emotional:0  Irritability:1  Sadness:0  Nervous or Anxious:0  Trouble falling asleep:2   Total number of symptoms: 17/22  Symptom Severity index: 64/132  Worse with physical activity? Yes  Worse with mental activity? Yes  Percent improved since injury: 60%    Full pain-free cervical PROM: yes    Tandem gait: - Forward, eyes open:  2 errors - Backward, eyes open: 4 errors - Forward, eyes closed: 2 errors - Backward, eyes closed: 2 errors  VOMS:   - Baseline symptoms: 0 - Horizontal Vestibular-Ocular Reflex: Dizzy 2/10  - Vertical Vestibular-Ocular Reflex: Head pressure 2/10  - Smooth pursuits: Dizzy 2/10  - Horizontal Saccades:  0/10  - Vertical Saccades: 0/10  - Visual Motion Sensitivity Test: Dizzy for/10  - Convergence: 6, 6  cm (<5 cm normal)     Electronically signed by:  Kendra Gilbert D.[facility] 8:20 AM 08/28/21

## 2021-08-27 NOTE — Telephone Encounter (Signed)
° °  Pre-operative Risk Assessment    Patient Name: Kendra Gilbert  DOB: 07/03/1956 MRN: [id number]      Request for Surgical Clearance    Procedure:   LEFT SHOULDER ROTATOR CUFF REPAIR  Date of Surgery:  Clearance TBD                                 Surgeon:  DR. Esmond Plants Surgeon's Group or Practice Name:  [facility] Phone number:  [phone] Fax number:  [phone] ATTN: Kendra Gilbert   Type of Clearance Requested:   - Medical  - Pharmacy:  Hold Rivaroxaban (Xarelto)     Type of Anesthesia:  General    Additional requests/questions:    Kendra Gilbert   08/27/2021, 3:36 PM

## 2021-08-28 ENCOUNTER — Other Ambulatory Visit: Payer: Self-pay

## 2021-08-28 ENCOUNTER — Ambulatory Visit (INDEPENDENT_AMBULATORY_CARE_PROVIDER_SITE_OTHER): Payer: Medicare Other | Admitting: Sports Medicine

## 2021-08-28 VITALS — BP 132/80 | HR 73 | Ht 59.0 in | Wt 179.0 lb

## 2021-08-28 DIAGNOSIS — S060X0D Concussion without loss of consciousness, subsequent encounter: Secondary | ICD-10-CM | POA: Diagnosis not present

## 2021-08-28 DIAGNOSIS — G44319 Acute post-traumatic headache, not intractable: Secondary | ICD-10-CM | POA: Diagnosis not present

## 2021-08-28 DIAGNOSIS — R270 Ataxia, unspecified: Secondary | ICD-10-CM

## 2021-08-28 NOTE — Telephone Encounter (Signed)
° °  Name: Kendra Gilbert  DOB: 09/25/1955  MRN: [id number]   Primary Cardiologist: Minus Breeding, MD  Chart reviewed as part of pre-operative protocol coverage. Patient was contacted 08/28/2021 in reference to pre-operative risk assessment for pending surgery as outlined below.  Kendra Gilbert was last seen on 10/30/2020 by Dr. Minus Breeding.  Since that day, Kendra Gilbert has done from a cardiac standpoint.  She denies any new or concerning symptoms..  According to the Revised Cardiac Risk Index (RCRI), her perioperative risk of major cardiac event is 0.4%.Her METS are 5.38 according to the [place] Activity Status Index (DASI).Therefore, based on ACC/AHA guidelines, the patient would be at acceptable risk for the planned procedure without further cardiovascular testing.   The patient was advised that if she develops new symptoms prior to surgery to contact our office to arrange for a follow-up visit, and she verbalized understanding.  Patient no longer takes Xarelto. She took it for 1 month following ankle surgery in August 2022 and has not taken it since that time..  I will route this recommendation to the requesting party via Epic fax function and remove from pre-op pool. Please call with questions.  Lenna Sciara, NP 08/28/2021, 10:21 AM

## 2021-08-28 NOTE — Patient Instructions (Addendum)
Good to see you  Recommend you call and set up a follow up appointment with [place] 2-3 weeks after your shoulder surgery

## 2021-08-28 NOTE — Telephone Encounter (Signed)
° °  Patient Name: Kendra Gilbert  DOB: 1955/08/07 MRN: [id number]  Primary Cardiologist: Minus Breeding, MD  Chart reviewed as part of pre-operative protocol coverage. Patient contacted on 08/28/2021 as part of preoperative screening process.  No answer.  Left voicemail for patient to call back.  Need to clarify whether or not patient is taking Xarelto and screen for any new or concerning symptoms prior to clearance.   Lenna Sciara, NP 08/28/2021, 8:13 AM

## 2021-09-02 HISTORY — PX: ROTATOR CUFF REPAIR: SHX139

## 2021-09-09 DIAGNOSIS — M75110 Incomplete rotator cuff tear or rupture of unspecified shoulder, not specified as traumatic: Secondary | ICD-10-CM | POA: Insufficient documentation

## 2021-10-12 NOTE — Progress Notes (Signed)
? Kendra Gilbert [name] ?[name] ?[place] ?Phone: [phone] ? ?Assessment and Plan:   ?  ?1. Concussion without loss of consciousness, subsequent encounter ?-Chronic, unchanged, subsequent visit ?- Essentially no change in left-sided headaches, sinus pressure, neck soreness ?- Patient had an office visit with ENT who states that they did not feel that her symptoms were sinus related after completion of multiple antibiotics and review of unremarkable CTs ?- CT head x2 since injury been unremarkable ?- Patient had recent shoulder surgery and has increased tension in the left side of her neck which may be contributing to left-sided headaches ?- At this point patient may be experiencing postconcussion symptoms versus chronic musculoskeletal conditions leading to headaches ?-I feel the patient has maximized her improvement at this time from a concussion standpoint.  I recommend focusing on musculoskeletal issues including her left shoulder and neck.  HEP for neck provided today. ? ?2. Acute post-traumatic headache, not intractable ?-Chronic, unchanged, subsequent visit ?- Patient had an office visit with ENT who states that they did not feel that her symptoms were sinus related after completion of multiple antibiotics and review of unremarkable CTs ?- CT head x2 since injury been unremarkable ?- At this point patient may be experiencing postconcussion symptoms versus chronic musculoskeletal conditions leading to headaches ?- Recommend that patient continue with orthopedic surgery and physical therapy.  If patient's shoulder pain and ROM improved, may decrease tension in patient's neck and trapezius which could in turn decrease patient's left-sided headaches  ?  ?Date of injury was 04/22/2021. Symptom severity scores of 19 and 80 today. Original symptom severity scores were 15 and 60. The patient was counseled on the nature of the injury, typical course and potential  options for further evaluation and treatment. Discussed the importance of compliance with recommendations. Patient stated understanding of this plan and willingness to comply. ? ?Recommendations:  ?-  Complete mental and physical rest for 48 hours after concussive event ?- Recommend light aerobic activity while keeping symptoms less than 3/10 ?- Stop mental or physical activities that cause symptoms to worsen greater than 3/10, and wait 24 hours before attempting them again ?- Eliminate screen time as much as possible for first 48 hours after concussive event, then continue limited screen time (recommend less than 2 hours per day) ? ? ?- Encouraged to RTC as needed ? ?Pertinent previous records reviewed include none ?  ?Time of visit 32 minutes, which included chart review, physical exam, treatment plan, symptom severity score, VOMS, and tandem gait testing being performed, interpreted, and discussed with patient at today's visit. ?  ?Subjective:   ?I, [name], am serving as a Education administrator for [employer] ?  ?Chief Complaint: concussion symptoms  ?  ?HPI:  ?06/09/21 ?Patient is a 66 year old female presenting with concussion like symptoms after a fall on 04/22/21. Patient was seen at the ED on 04/22/21 stating that she tripped outside and hit her face on the ground. Patient has noticed frontal headaches, nose pain, swelling and discoloration around both eyes. Patient was seen back at the ED and eye doctor on 05/25/21 C/O headaches, but was unsure if this was related to sinusitis or her fall. Patient was to continue taking tylenol and to follow up with sports medicine for evaluation and treatment. ?  ? 06/23/2021 ?Patient states that she still has a lot of head pains feels like she has bubbles in her ears a lot of nasal pain  and sometimes teeth hurt, a lot of neck issues, tried the neck exercises but felt crunchy and decided to leave it alone. Still complaining of eye discomfort has limited screen time  and mental activity. Has a rotator cuff strain she learned from [facility] will be seeing shoulder doctor next Thursday already has an appointment with shoulder doc  ?  ?07/14/2021 ?Patient states that she has been sick. The head pain continues and the illness has made it not better. Has an appointment for ear nose and throat . Has been going to vestibular therapy but its painful ?  ?07/28/2021 ?Patient states that she is  hanging in there , the head is not good at all. Vestibular was good was having neck issues and that seemed to help just so sensitive all the head pain is on the left side , seeing ear nose and throat next Wednesday . Ever since the fall body has become hypersensitive ?  ?08/18/2021 ?Patient states has a antibiotic and will have a CAT scan from ENT, has been going to PT and they are working on her neck pain , and her eyes since they eyes and neck work hand in hand, had MRI on L shoulder yesterday , head still isn't doing well, a lot of head pain on the left side sometimes middle of the forehead but mostly on the left side with the left eye  , pressure gets bad when she is reading or doing computer work , this morning her eye started acting up when she inhaled to much or when she steps outside . She states that she feels the same  ?  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  ?10/13/2021 ?Patient states that she doesn't have as much head pain In the forehead now its more on the left side due to the shoulder surgery  ? ?Concussion HPI:  ?- Injury date: 04/22/21   ?- Mechanism of injury: fall  ?- LOC: no  ?- Initial evaluation: 04/22/21  ?- Previous head injuries/concussions: no   ?- Previous imaging: yes    ?- Social history: activities include retired  ?Hospitalization  for head injury? No ?Diagnosed/treated for headache disorder or migraines? yes ?Diagnosed with learning disability [name] No ?Diagnosed with ADD/ADHD? No ?Diagnose with Depression, anxiety, or other Psychiatric Disorder? No ?  ?Current medications:  ?Current Outpatient Medications  ?Medication Sig Dispense Refill  ? Acetaminophen (TYLENOL PO) Take by mouth.    ? acetaminophen (TYLENOL) 325 MG tablet Take 325 mg by mouth daily as needed for mild pain, fever or headache.    ? albuterol (PROVENTIL HFA) 108 (90 Base) MCG/ACT inhaler Inhale 2 puffs into the lungs every 4 (four) hours as needed for wheezing or shortness of breath. 18 g 2  ? azelastine (ASTELIN) 0.1 % nasal spray USE 2 SPRAYS IN EACH NOSTRILS TWICE DAILY. 30 mL 4  ? Bepotastine Besilate (BEPREVE) 1.5 % SOLN Place 1 drop into both eyes 2 (two) times daily as needed. 10 mL 5  ? ciprofloxacin (CILOXAN) 0.3 % ophthalmic solution Place 2 drops into both eyes every 2 (two) hours. Administer 1 drop, every 2 hours, while awake, for 2 days. Then 1 drop, every 4 hours, while awake, for the next 5 days. 5 mL  0  ? cromolyn (OPTICROM) 4 % ophthalmic solution Place 1 drop into both eyes every 6 (six) hours as needed. 10 mL 5  ? diphenhydrAMINE (BENADRYL) 25 MG tablet Take 25 mg by mouth every 6 (six) hours as needed.    ? EPINEPHrine 0.3 mg/0.3 mL IJ SOAJ injection Inject 0.3 mg into the muscle as needed for anaphylaxis. 2 each 1  ? fluticasone (FLOVENT HFA) 110 MCG/ACT inhaler Inhale two puffs twice daily if needed to prevent cough or wheeze.  Rinse, gargle, and spit after use. 12 g 5  ? ipratropium (ATROVENT) 0.06 % nasal spray Place 2 sprays into both nostrils every 4 (four) hours as needed for rhinitis. 15 mL 5  ? ipratropium-albuterol (DUONEB) 0.5-2.5 (3) MG/3ML SOLN Use 1 vial via nebulizer every 4-6 hours as needed for cough, wheeze, shortness of breath or chest tightness 180 mL 1  ? levocetirizine (XYZAL) 5 MG tablet Xyzal 5 mg tablet ? Take 1 tablet every  day by oral route.    ? losartan (COZAAR) 25 MG tablet Take 1 tablet (25 mg total) by mouth in the morning and at bedtime. 180 tablet 3  ? methocarbamol (ROBAXIN) 500 MG tablet Take 500 mg by mouth every

## 2021-10-13 ENCOUNTER — Ambulatory Visit (INDEPENDENT_AMBULATORY_CARE_PROVIDER_SITE_OTHER): Payer: Medicare Other | Admitting: Sports Medicine

## 2021-10-13 VITALS — BP 118/78 | HR 77 | Ht 59.0 in | Wt 177.0 lb

## 2021-10-13 DIAGNOSIS — S060X0D Concussion without loss of consciousness, subsequent encounter: Secondary | ICD-10-CM | POA: Diagnosis not present

## 2021-10-13 DIAGNOSIS — G44319 Acute post-traumatic headache, not intractable: Secondary | ICD-10-CM | POA: Diagnosis not present

## 2021-10-13 NOTE — Patient Instructions (Addendum)
Good to see you  Neck HEP As needed follow up  

## 2021-10-28 ENCOUNTER — Other Ambulatory Visit: Payer: Self-pay | Admitting: Cardiology

## 2021-11-13 ENCOUNTER — Ambulatory Visit (INDEPENDENT_AMBULATORY_CARE_PROVIDER_SITE_OTHER): Payer: Medicare Other | Admitting: Allergy

## 2021-11-13 ENCOUNTER — Encounter: Payer: Self-pay | Admitting: Allergy

## 2021-11-13 VITALS — BP 118/60 | HR 93 | Temp 98.1°F | Resp 20 | Ht <= 58 in | Wt 177.8 lb

## 2021-11-13 DIAGNOSIS — J3089 Other allergic rhinitis: Secondary | ICD-10-CM

## 2021-11-13 DIAGNOSIS — H1013 Acute atopic conjunctivitis, bilateral: Secondary | ICD-10-CM | POA: Diagnosis not present

## 2021-11-13 DIAGNOSIS — J4550 Severe persistent asthma, uncomplicated: Secondary | ICD-10-CM | POA: Diagnosis not present

## 2021-11-13 DIAGNOSIS — J300 Vasomotor rhinitis: Secondary | ICD-10-CM

## 2021-11-13 DIAGNOSIS — H1012 Acute atopic conjunctivitis, left eye: Secondary | ICD-10-CM

## 2021-11-13 MED ORDER — IPRATROPIUM-ALBUTEROL 0.5-2.5 (3) MG/3ML IN SOLN: RESPIRATORY_TRACT | 1 refills | Status: DC

## 2021-11-13 NOTE — Addendum Note (Signed)
Addended by: Eloy End D on: 11/13/2021 05:21 PM ? ? Modules accepted: Orders ? ?

## 2021-11-13 NOTE — Progress Notes (Signed)
? ? ?Follow-up Note ? ?RE: Kendra Gilbert MRN: [id number] DOB: 1955/11/01 ?Date of Office Visit: 11/13/2021 ? ? ?History of present illness: ?Kendra Gilbert is a 66 y.o. female presenting today for follow-up of allergic rhinitis with conjunctivitis, vasomotor rhinitis and asthma.  She was last seen in the office on 623 by myself.  She had shoulder surgery in March and is currently in PT. She states the surgery went well.  Both the surgery and her need for pain control she has been using oxycodone for this she states she has been off all to her allergy medications which includes the Xyzal.  She has continued to take her Symbicort 160 mcg 2 puffs twice a day and states that this does continue to help with her asthma control.  She also has access to Flovent which she has not needed to add in at this time.  She has access to albuterol inhaler as well as vials for nebulizer.  She reports quite rare use of her albuterol.  She has not tried to sting side effect of possible to minimize her pollen exposure.  She does plan to get back on her Xyzal soon here.  She still reports irritation and soreness of the nasal passage but she has not use any nasal sprays at to decrease any further irritation.  She has been noticing some more chest tightness, cough, itching and nasal congestion and attributes the symptoms to any increase in activity that she may need to do, changes in the temperature/triggers. ?She did see ENT Dr. West Carbo back in February who believes her continued symptoms of headaches and sinus inflammation stems from her past history of concussion. ? ?Review of systems: ?Review of Systems  ?Constitutional: Negative.   ?HENT:  Positive for congestion.   ?Eyes: Negative.   ?Respiratory:  Positive for cough and chest tightness.   ?Cardiovascular: Negative.   ?Gastrointestinal: Negative.   ?Musculoskeletal: Negative.   ?Skin: Negative.   ?     itch  ?Allergic/Immunologic: Negative.   ?Neurological: Negative.    ? ?All  other systems negative unless noted above in HPI ? ?Past medical/social/surgical/family history have been reviewed and are unchanged unless specifically indicated below. ? ?No changes ? ?Medication List: ?Current Outpatient Medications  ?Medication Sig Dispense Refill  ? acetaminophen (TYLENOL) 325 MG tablet Take 325 mg by mouth daily as needed for mild pain, fever or headache.    ? albuterol (PROVENTIL HFA) 108 (90 Base) MCG/ACT inhaler Inhale 2 puffs into the lungs every 4 (four) hours as needed for wheezing or shortness of breath. 18 g 2  ? diphenhydrAMINE (BENADRYL) 25 MG tablet Take 25 mg by mouth every 6 (six) hours as needed.    ? EPINEPHrine 0.3 mg/0.3 mL IJ SOAJ injection Inject 0.3 mg into the muscle as needed for anaphylaxis. 2 each 1  ? fluticasone (FLOVENT HFA) 110 MCG/ACT inhaler Inhale two puffs twice daily if needed to prevent cough or wheeze.  Rinse, gargle, and spit after use. 12 g 5  ? ipratropium (ATROVENT) 0.06 % nasal spray Place 2 sprays into both nostrils every 4 (four) hours as needed for rhinitis. 15 mL 5  ? levocetirizine (XYZAL) 5 MG tablet Xyzal 5 mg tablet ? Take 1 tablet every day by oral route.    ? losartan (COZAAR) 25 MG tablet TAKE 1 TABLET(25 MG) BY MOUTH IN THE MORNING AND AT BEDTIME 180 tablet 3  ? pantoprazole (PROTONIX) 40 MG tablet 20 mg daily.    ? pravastatin (PRAVACHOL) 80  MG tablet pravastatin 80 mg tablet    ? Sodium Chloride-Sodium Bicarb 1.57 g PACK Place into the nose.    ? SYMBICORT 160-4.5 MCG/ACT inhaler INHALE 2 PUFFS INTO THE LUNGS TWICE DAILY 10 g 5  ? XOLAIR 150 MG injection Inject into the skin. On hold for now- 12/19/2020    ? azelastine (ASTELIN) 0.1 % nasal spray USE 2 SPRAYS IN EACH NOSTRILS TWICE DAILY. (Patient not taking: Reported on 11/13/2021) 30 mL 4  ? cromolyn (OPTICROM) 4 % ophthalmic solution Place 1 drop into both eyes every 6 (six) hours as needed. (Patient not taking: Reported on 11/13/2021) 10 mL 5  ? ipratropium-albuterol (DUONEB) 0.5-2.5 (3)  MG/3ML SOLN Use 1 vial via nebulizer every 4-6 hours as needed for cough, wheeze, shortness of breath or chest tightness 180 mL 1  ? ?Current Facility-Administered Medications  ?Medication Dose Route Frequency Provider Last Rate Last Admin  ? omalizumab Arvid Right) injection 375 mg  375 mg Subcutaneous Q14 Days Kennith Gain, MD   375 mg at 11/24/20 0830  ?  ? ?Known medication allergies: ?Allergies  ?Allergen Reactions  ? Amlodipine Shortness Of Breath and Swelling  ? Bee Venom Anaphylaxis and Hives  ? Lisinopril Itching and Cough  ?  Dry cough and severe itching in the throat ?  ? Other Other (See Comments)  ?  Mosquito bites cause hives all over body  ? Aspirin Other (See Comments)  ?  Causes stomach problems  ? Erythromycin Other (See Comments)  ?  Ate the lining of her stomach  ? Hctz [Hydrochlorothiazide]   ?  Rash ?  ? Penicillins Rash  ?  Has patient had a PCN reaction causing immediate rash, facial/tongue/throat swelling, SOB or lightheadedness with hypotension: Yes ?Has patient had a PCN reaction causing severe rash involving mucus membranes or skin necrosis: Yes ?Has patient had a PCN reaction that required hospitalization: No  ?Has patient had a PCN reaction occurring within the last 10 years: No ?If all of the above answers are "NO", then may proceed with Cephalosporin use. ?  ? ? ? ?Physical examination: ?Blood pressure 118/60, pulse 93, temperature 98.1 ?F (36.7 ?C), temperature source Temporal, resp. rate 20, height 4' 8.5" (1.435 m), weight 177 lb 12.8 oz (80.6 kg), SpO2 99 %. ? ?General: Alert, interactive, in no acute distress. ?HEENT: PERRLA, TMs pearly gray, turbinates minimally edematous without discharge, post-pharynx non erythematous. ?Neck: Supple without lymphadenopathy. ?Lungs: Clear to auscultation without wheezing, rhonchi or rales. {no increased work of breathing. ?CV: Normal S1, S2 without murmurs. ?Abdomen: Nondistended, nontender. ?Skin: Warm and dry, without lesions or  rashes. ?Extremities: Left arm in a sling, no clubbing, cyanosis or edema. ?Neuro:   Grossly intact. ? ?Diagnositics/Labs: ? ?Spirometry: FEV1: 1.12 L 66%, FVC: 1.35 L 61% predicted.  This is an improved study from her previous! ? ?Assessment and plan: ?Allergic rhinitis with conjunctivitis ?Vasomotor rhinitis ?Severe persistent asthma  ? ?- continue avoidance measures for grass pollens, weed pollens, tree pollens, mold, dust mites ?- continue Xyzal '5mg'$  daily.      ?- have access to albuterol inhaler 2 puffs or albuterol 1 vial via nebulizer every 4-6 hours as needed for cough/wheeze/shortness of breath/chest tightness.  May use 15-20 minutes prior to activity.   Monitor frequency of use.    Use inhaler with spacer device.   ?- continue Symbicort 136mg 2 puffs twice a day ?- use Flovent 1197m 2 puffs twice a day as needed if not meeting below goals  ?-  recommend use of flutter valve device which helps to loosen/mobilize mucus from chest ? ?Asthma control goals:  ?Full participation in all desired activities (may need albuterol before activity) ?Albuterol use two time or less a week on average (not counting use with activity) ?Cough interfering with sleep two time or less a month ?Oral steroids no more than once a year ?No hospitalizations ? ? ?Follow-up in 4-6 months or sooner if needed ?I appreciate the opportunity to take part in Jamie's care. Please do not hesitate to contact me with questions. ? ?Sincerely, ? ? ?Prudy Feeler, MD ?Allergy/Immunology ?[facility] ? ? ?

## 2021-11-13 NOTE — Patient Instructions (Addendum)
?-   continue avoidance measures for grass pollens, weed pollens, tree pollens, mold, dust mites ?- continue Xyzal '5mg'$  daily.      ?- have access to albuterol inhaler 2 puffs or albuterol 1 vial via nebulizer every 4-6 hours as needed for cough/wheeze/shortness of breath/chest tightness.  May use 15-20 minutes prior to activity.   Monitor frequency of use.    Use inhaler with spacer device.   ?- continue Symbicort 151mg 2 puffs twice a day ?- use Flovent 1180m 2 puffs twice a day as needed if not meeting below goals  ?- recommend use of flutter valve device which helps to loosen/mobilize mucus from chest ? ?Asthma control goals:  ?Full participation in all desired activities (may need albuterol before activity) ?Albuterol use two time or less a week on average (not counting use with activity) ?Cough interfering with sleep two time or less a month ?Oral steroids no more than once a year ?No hospitalizations ? ? ?Follow-up in 4-6 months or sooner if needed ?

## 2021-11-23 ENCOUNTER — Telehealth: Payer: Self-pay

## 2021-11-23 NOTE — Telephone Encounter (Signed)
Kendra Gilbert, Pharmacy Tech/ -([phone] - called in requesting Dx for [facility] per Medicare - gave [id number].

## 2021-11-27 ENCOUNTER — Other Ambulatory Visit: Payer: Self-pay | Admitting: Allergy

## 2021-12-15 DIAGNOSIS — M7989 Other specified soft tissue disorders: Secondary | ICD-10-CM | POA: Insufficient documentation

## 2021-12-15 NOTE — Progress Notes (Unsigned)
Cardiology Office Note   Date:  12/16/2021   ID:  Branae, Crail January 17, 1956, MRN [id number]  PCP:  Marda Stalker, PA-C  Cardiologist:   Minus Breeding, MD   Chief Complaint  Patient presents with   Coronary Artery Disease      History of Present Illness: Kendra Gilbert is a 66 y.o. female who presents for follow up of HTN.  She was intolerant of lisinopril which caused some cough.  We switched her to Cozaar.   She also has nonobstructive coronary disease.  In 2020 she had a CT coronary angiogram that demonstrated some left main calcification.  The LAD had proximal 50 to 69% stenosis.  She was having more shortness of breath I saw her recently. Her Lexiscan Myoview was negative in Feb 2022.    Since I last saw her she has had some shoulder problems.  She has been doing physical therapy with this.  She gets occasional left sharp sided chest pain.  This seems to be longstanding.  Seems to be sometimes with exertion but sporadic.  I am not sure that it is changed from previous.  She cannot necessarily bring it on.  It last for only a few minutes.  It does not seem to be increasing in frequency or intensity.  She does not have any associated symptoms.  She is not having any substernal discomfort or shoulder discomfort.  It is not clear to me that this is any different from symptoms that she experienced prior to her catheterization or her perfusion study.   Past Medical History:  Diagnosis Date   Asthma    Chronic kidney disease    cyst in left kidney   Colon cancer ([place]) 2017   surgical tx  removal of polyp   Coronary artery calcification    a. POET in 2015 with no acute findings.   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   no current issues with   Pneumonia 2016   Rheumatoid arthritis ([place])    oa also, back arthritis, hx rheumatoid     Past Surgical History:  Procedure Laterality Date   ABDOMINAL HYSTERECTOMY     ovaries left   ANKLE SURGERY Left 02/12/2021   COLONOSCOPY WITH PROPOFOL N/A 03/23/2017   Procedure: COLONOSCOPY WITH PROPOFOL;  Surgeon: Arta Silence, MD;  Location: [facility];  Service: Endoscopy;  Laterality: N/A;   colonscopy  2017   DILATION AND CURETTAGE OF UTERUS     few done for endometriosis   GASTROC RECESSION EXTREMITY Left 02/12/2021   Procedure: Left gastroc recession;  Surgeon: Wylene Simmer, MD;  Location: [facility];  Service: Orthopedics;  Laterality: Left;   ROTATOR CUFF REPAIR  09/2021   athroscopic and open     Current Outpatient Medications  Medication Sig Dispense Refill   acetaminophen (TYLENOL) 500 MG tablet Take 325 mg by mouth daily as needed for mild pain, fever or headache.     albuterol (PROVENTIL HFA) 108 (90 Base) MCG/ACT inhaler Inhale 2 puffs into the lungs every 4 (four) hours as needed for wheezing or shortness of breath. 18 g  2   azelastine (ASTELIN) 0.1 % nasal spray USE 2 SPRAYS IN EACH NOSTRILS TWICE DAILY. 30 mL 4   cromolyn (OPTICROM) 4 % ophthalmic solution Place 1 drop into both eyes every 6 (six) hours as needed. 10 mL 5   diphenhydrAMINE (BENADRYL) 25 MG tablet Take 25 mg by mouth every 6 (six) hours as needed.     EPINEPHrine 0.3 mg/0.3 mL IJ SOAJ injection Inject 0.3 mg into the muscle as needed for anaphylaxis. 2 each 1   fluticasone (FLOVENT HFA) 110 MCG/ACT inhaler Inhale two puffs twice daily if needed to prevent cough or wheeze.  Rinse, gargle, and spit after use. 12 g 5   ipratropium (ATROVENT) 0.06 % nasal spray Place 2 sprays into both nostrils every 4 (four) hours as needed for rhinitis. 15 mL 5   ipratropium-albuterol (DUONEB) 0.5-2.5 (3) MG/3ML SOLN Use 1 vial via nebulizer every  4-6 hours as needed for cough, wheeze, shortness of breath or chest tightness 180 mL 1   levocetirizine (XYZAL) 5 MG tablet Xyzal 5 mg tablet  Take 1 tablet every day by oral route.     losartan (COZAAR) 25 MG tablet TAKE 1 TABLET(25 MG) BY MOUTH IN THE MORNING AND AT BEDTIME 180 tablet 3   pantoprazole (PROTONIX) 20 MG tablet Take 20 mg by mouth daily.     pravastatin (PRAVACHOL) 80 MG tablet pravastatin 80 mg tablet     Sodium Chloride-Sodium Bicarb 1.57 g PACK Place into the nose.     SYMBICORT 160-4.5 MCG/ACT inhaler INHALE 2 PUFFS INTO THE LUNGS TWICE DAILY 10.2 g 5   XOLAIR 150 MG injection Inject into the skin. On hold for now- 12/19/2020     Current Facility-Administered Medications  Medication Dose Route Frequency Provider Last Rate Last Admin   omalizumab Arvid Right) injection 375 mg  375 mg Subcutaneous Q14 Days Kennith Gain, MD   375 mg at 11/24/20 0830    Allergies:   Amlodipine, Bee venom, Lisinopril, Other, Aspirin, Cefdinir, Erythromycin, Hctz [hydrochlorothiazide], and Penicillins    ROS:  Please see the history of present illness.   Otherwise, review of systems are positive for none.   All other systems are reviewed and negative.    PHYSICAL EXAM: VS:  BP 122/68   Pulse 75   Ht '4\' 11"'$  (1.499 m)   Wt 180 lb 3.2 oz (81.7 kg)   SpO2 98%   BMI 36.40 kg/m  , BMI Body mass index is 36.4 kg/m. GENERAL:  Well appearing NECK:  No jugular venous distention, waveform within normal limits, carotid upstroke brisk and symmetric, no bruits, no thyromegaly LUNGS:  Clear to auscultation bilaterally CHEST:  Unremarkable HEART:  PMI not displaced or sustained,S1 and S2 within normal limits, no S3, no S4, no clicks, no rubs, no murmurs ABD:  Flat, positive bowel sounds normal in frequency in pitch, no bruits, no rebound, no guarding, no midline pulsatile mass, no hepatomegaly, no splenomegaly EXT:  2 plus pulses throughout, mild  edema, no cyanosis no clubbing  EKG:  EKG  is  ordered today. Sinus rhythm, rate 75, axis within normal limits, intervals within normal limits, no acute ST-T wave changes.  Recent Labs: 03/25/2021: ALT 21 05/25/2021: BUN 16; Creatinine, Ser 0.66; Hemoglobin 12.9; Platelets 175; Potassium 3.7; Sodium 143    Lipid Panel    Component Value Date/Time   CHOL 166 03/25/2021 0901   TRIG 105 03/25/2021 0901   HDL 67 03/25/2021 0901   CHOLHDL 2.5 03/25/2021 0901  CHOLHDL 3.3 02/06/2016 0930   VLDL 19 02/06/2016 0930   LDLCALC 80 03/25/2021 0901   LDLDIRECT 135.2 08/23/2013 1142      Wt Readings from Last 3 Encounters:  12/16/21 180 lb 3.2 oz (81.7 kg)  11/13/21 177 lb 12.8 oz (80.6 kg)  10/13/21 177 lb (80.3 kg)      Other studies Reviewed: Additional studies/ records that were reviewed today include: Labs Review of the above records demonstrates:  Please see elsewhere in the note.     ASSESSMENT AND PLAN:  Chest pain:   Her chest pain is nonanginal in description.  She has had an extensive work-up and I do not think there is been a change since her perfusion study.  She would let me know if this changes in quality or frequency or intensity.  Until then she needs aggressive risk reduction we had a long conversation about this.   Of note she does not tolerate ASA.     Leg swelling: She had venous Dopplers with no evidence of DVT.  This seems to be mild today.  HTN: Her blood pressure is at target.  No change in therapy.   Dyslipidemia: LDL was down from 1 37-80 with the addition of pravastatin.  She would not tolerate other meds and would not want to try these.  I think with this LDL and HDL of 67 I would not change necessarily her therapy.  We talked about a plant-based Mediterranean diet.     Current medicines are reviewed at length with the patient today.  The patient does not have concerns regarding medicines.  The following changes have been made: None  Labs/ tests ordered today include: None  Orders Placed This  Encounter  Procedures   EKG 12-Lead     Disposition:   FU with me in 12 months or sooner based on symptoms.Ronnell Guadalajara, MD  12/16/2021 9:30 AM    [place]

## 2021-12-16 ENCOUNTER — Ambulatory Visit (INDEPENDENT_AMBULATORY_CARE_PROVIDER_SITE_OTHER): Payer: Medicare Other | Admitting: Cardiology

## 2021-12-16 ENCOUNTER — Encounter: Payer: Self-pay | Admitting: Cardiology

## 2021-12-16 VITALS — BP 122/68 | HR 75 | Ht 59.0 in | Wt 180.2 lb

## 2021-12-16 DIAGNOSIS — R0602 Shortness of breath: Secondary | ICD-10-CM | POA: Diagnosis not present

## 2021-12-16 DIAGNOSIS — E785 Hyperlipidemia, unspecified: Secondary | ICD-10-CM | POA: Diagnosis not present

## 2021-12-16 DIAGNOSIS — I10 Essential (primary) hypertension: Secondary | ICD-10-CM | POA: Diagnosis not present

## 2021-12-16 DIAGNOSIS — M7989 Other specified soft tissue disorders: Secondary | ICD-10-CM | POA: Diagnosis not present

## 2021-12-16 NOTE — Patient Instructions (Signed)

## 2021-12-27 ENCOUNTER — Other Ambulatory Visit: Payer: Self-pay | Admitting: Cardiology

## 2021-12-28 ENCOUNTER — Encounter (HOSPITAL_COMMUNITY): Payer: Self-pay | Admitting: *Deleted

## 2021-12-28 ENCOUNTER — Other Ambulatory Visit: Payer: Self-pay

## 2021-12-28 ENCOUNTER — Ambulatory Visit (HOSPITAL_COMMUNITY)
Admission: EM | Admit: 2021-12-28 | Discharge: 2021-12-28 | Disposition: A | Discharge status: Home / Self Care | Payer: Medicare Other | Attending: Student | Admitting: Student

## 2021-12-28 DIAGNOSIS — R0789 Other chest pain: Secondary | ICD-10-CM | POA: Diagnosis not present

## 2021-12-28 DIAGNOSIS — J4541 Moderate persistent asthma with (acute) exacerbation: Secondary | ICD-10-CM

## 2021-12-28 MED ORDER — DOXYCYCLINE HYCLATE 100 MG PO CAPS: 100.0000 mg | ORAL_CAPSULE | Freq: Two times a day (BID) | ORAL | 0 refills | Status: AC

## 2021-12-28 MED ORDER — PREDNISONE 20 MG PO TABS: 40.0000 mg | ORAL_TABLET | Freq: Every day | ORAL | 0 refills | Status: AC

## 2021-12-30 ENCOUNTER — Encounter: Payer: Self-pay | Admitting: Allergy

## 2021-12-30 ENCOUNTER — Ambulatory Visit (INDEPENDENT_AMBULATORY_CARE_PROVIDER_SITE_OTHER): Payer: Medicare Other | Admitting: Allergy

## 2021-12-30 VITALS — BP 108/72 | HR 87 | Temp 97.9°F | Ht 59.0 in | Wt 179.8 lb

## 2021-12-30 DIAGNOSIS — J4550 Severe persistent asthma, uncomplicated: Secondary | ICD-10-CM | POA: Diagnosis not present

## 2021-12-30 MED ORDER — METHYLPREDNISOLONE ACETATE 80 MG/ML IJ SUSP
80.0000 mg | Freq: Once | INTRAMUSCULAR | Status: AC
  Administered 2021-12-30: 80 mg via INTRAMUSCULAR

## 2021-12-30 MED ORDER — IPRATROPIUM-ALBUTEROL 0.5-2.5 (3) MG/3ML IN SOLN
3.0000 mL | RESPIRATORY_TRACT | Status: AC
Start: 2021-12-30 — End: 2021-12-30
  Administered 2021-12-30: 3 mL via RESPIRATORY_TRACT

## 2021-12-30 NOTE — Progress Notes (Signed)
Follow-up Note  RE: Kendra Gilbert MRN: [id number] DOB: [birth year] Date of Office Visit: 12/30/2021   History of present illness: Kendra Gilbert is a 66 y.o. female presenting today for sick visit for asthma flare.  She was last seen in the office on 11/13/2021 by myself.  She states about 3 weeks ago is when she started having symptoms of increased cough, wheezing, chest tightness and back ache.  The symptoms are interfering with sleep.  She believes that the symptoms were triggered by the [place] exposures here.  She states when she would go outside is actually really noticed the symptoms.  She has been needing to use her albuterol more often due to the symptoms.  She is on both Symbicort and Flovent at this time to help minimize her symptoms. She did go to an urgent care earlier in the week with the symptoms and was prescribed doxycycline which she is in the middle of her course and was also prescribed prednisone course.  She did not start this as she states she has had side effects related to prednisone use in the past tolerated what caused redness of the skin and swelling.  She states she has had steroid shots before like cortisone injections with minimal symptoms.  Review of systems: Review of Systems  Constitutional: Negative.   HENT: Negative.    Eyes: Negative.   Respiratory:  Positive for cough, chest tightness, shortness of breath and wheezing.   Cardiovascular: Negative.   Gastrointestinal: Negative.   Musculoskeletal:  Positive for back pain.  Skin: Negative.   Allergic/Immunologic: Negative.   Neurological: Negative.      All other systems negative unless noted above in HPI  Past medical/social/surgical/family history have been reviewed and are unchanged unless specifically indicated below.  No changes  Medication List: Current Outpatient Medications  Medication Sig Dispense Refill   acetaminophen (TYLENOL) 500 MG tablet Take 325 mg by mouth daily as  needed for mild pain, fever or headache.     albuterol (PROVENTIL HFA) 108 (90 Base) MCG/ACT inhaler Inhale 2 puffs into the lungs every 4 (four) hours as needed for wheezing or shortness of breath. 18 g 2   diphenhydrAMINE (BENADRYL) 25 MG tablet Take 25 mg by mouth every 6 (six) hours as needed.     doxycycline (VIBRAMYCIN) 100 MG capsule Take 1 capsule (100 mg total) by mouth 2 (two) times daily for 7 days. 14 capsule 0   EPINEPHrine 0.3 mg/0.3 mL IJ SOAJ injection Inject 0.3 mg into the muscle as needed for anaphylaxis. 2 each 1   fluticasone (FLOVENT HFA) 110 MCG/ACT inhaler Inhale two puffs twice daily if needed to prevent cough or wheeze.  Rinse, gargle, and spit after use. 12 g 5   ipratropium-albuterol (DUONEB) 0.5-2.5 (3) MG/3ML SOLN Use 1 vial via nebulizer every 4-6 hours as needed for cough, wheeze, shortness of breath or chest tightness 180 mL 1   levocetirizine (XYZAL) 5 MG tablet Xyzal 5 mg tablet  Take 1 tablet every day by oral route.     losartan (COZAAR) 25 MG tablet TAKE 1 TABLET(25 MG) BY MOUTH IN THE MORNING AND AT BEDTIME 180 tablet 3   pantoprazole (PROTONIX) 20 MG tablet Take 20 mg by mouth daily.     pravastatin (PRAVACHOL) 80 MG tablet pravastatin 80 mg tablet     predniSONE (DELTASONE) 20 MG tablet Take 2 tablets (40 mg total) by mouth daily for 5 days. Take with breakfast or lunch. Avoid NSAIDs (ibuprofen, etc)  while taking this medication. 10 tablet 0   Sodium Chloride-Sodium Bicarb 1.57 g PACK Place into the nose.     SYMBICORT 160-4.5 MCG/ACT inhaler INHALE 2 PUFFS INTO THE LUNGS TWICE DAILY 10.2 g 5   Current Facility-Administered Medications  Medication Dose Route Frequency Provider Last Rate Last Admin   omalizumab Arvid Right) injection 375 mg  375 mg Subcutaneous Q14 Days Kennith Gain, MD   375 mg at 11/24/20 0830     Known medication allergies: Allergies  Allergen Reactions   Amlodipine Shortness Of Breath and Swelling   Bee Venom Anaphylaxis  and Hives   Lisinopril Itching and Cough    Dry cough and severe itching in the throat    Other Other (See Comments)    Mosquito bites cause hives all over body   Aspirin Other (See Comments)    Causes stomach problems   Cefdinir     Other reaction(s): hives   Erythromycin Other (See Comments)    Ate the lining of her stomach   Hctz [Hydrochlorothiazide]     Rash    Penicillins Rash    Has patient had a PCN reaction causing immediate rash, facial/tongue/throat swelling, SOB or lightheadedness with hypotension: Yes Has patient had a PCN reaction causing severe rash involving mucus membranes or skin necrosis: Yes Has patient had a PCN reaction that required hospitalization: No  Has patient had a PCN reaction occurring within the last 10 years: No If all of the above answers are "NO", then may proceed with Cephalosporin use.      Physical examination: Blood pressure 108/72, pulse 87, temperature 97.9 F (36.6 C), height '4\' 11"'$  (1.499 m), weight 179 lb 12.8 oz (81.6 kg), SpO2 93 %.  General: Alert, interactive, in no acute distress. HEENT: PERRLA, TMs pearly gray, turbinates minimally edematous without discharge, post-pharynx non erythematous. Neck: Supple without lymphadenopathy. Lungs: Decreased breath sounds with expiratory wheezing bilaterally. {no increased work of breathing. CV: Normal S1, S2 without murmurs. Abdomen: Nondistended, nontender. Skin: Warm and dry, without lesions or rashes. Extremities:  No clubbing, cyanosis or edema. Neuro:   Grossly intact.  Diagnositics/Labs:  Spirometry: FEV1: 0.85L 57%, FVC: 1.03L 55% predicted.  DuoNeb given in the office with post lung exam improved airflow with expiratory wheeze  Assessment and plan:   Asthma flare secondary to environmental pollutants Allergic rhinitis with conjunctivitis Vasomotor rhinitis  - continue avoidance measures for grass pollens, weed pollens, tree pollens, mold, dust mites - continue Xyzal '5mg'$   daily.      - have access to albuterol inhaler 2 puffs or albuterol 1 vial via nebulizer every 4-6 hours as needed for cough/wheeze/shortness of breath/chest tightness.  May use 15-20 minutes prior to activity.   Monitor frequency of use.    Use inhaler with spacer device.   - continue Symbicort 176mg 2 puffs twice a day - use Flovent 1152m 2 puffs twice a day for now until symptoms have improved - provided with depomedrol injection in office to improve asthma flare symptoms - recommend use of flutter valve device which helps to loosen/mobilize mucus from chest  Asthma control goals:  Full participation in all desired activities (may need albuterol before activity) Albuterol use two time or less a week on average (not counting use with activity) Cough interfering with sleep two time or less a month Oral steroids no more than once a year No hospitalizations   Follow-up in 4-6 months or sooner if needed  I appreciate the opportunity to take part in  Anjeanette's care. Please do not hesitate to contact me with questions.  Sincerely,   Prudy Feeler, MD Allergy/Immunology [facility]

## 2021-12-30 NOTE — Patient Instructions (Signed)
Asthma flare secondary to environmental pollutants  - continue avoidance measures for grass pollens, weed pollens, tree pollens, mold, dust mites - continue Xyzal '5mg'$  daily.      - have access to albuterol inhaler 2 puffs or albuterol 1 vial via nebulizer every 4-6 hours as needed for cough/wheeze/shortness of breath/chest tightness.  May use 15-20 minutes prior to activity.   Monitor frequency of use.    Use inhaler with spacer device.   - continue Symbicort 150mg 2 puffs twice a day - use Flovent 1138m 2 puffs twice a day for now until symptoms have improved - provided with depomedrol injection in office to improve asthma flare symptoms - recommend use of flutter valve device which helps to loosen/mobilize mucus from chest  Asthma control goals:  Full participation in all desired activities (may need albuterol before activity) Albuterol use two time or less a week on average (not counting use with activity) Cough interfering with sleep two time or less a month Oral steroids no more than once a year No hospitalizations   Follow-up in 4-6 months or sooner if needed

## 2022-01-04 ENCOUNTER — Other Ambulatory Visit: Payer: Self-pay | Admitting: Cardiology

## 2022-01-04 ENCOUNTER — Telehealth: Payer: Self-pay | Admitting: Cardiology

## 2022-01-04 NOTE — Telephone Encounter (Signed)
Medication refilled by provider already

## 2022-01-04 NOTE — Telephone Encounter (Signed)
*  STAT* If patient is at the pharmacy, call can be transferred to refill team.   1. Which medications need to be refilled? (please list name of each medication and dose if known) pravastatin (PRAVACHOL) 80 MG tablet  2. Which pharmacy/location (including street and city if local pharmacy) is medication to be sent to? [facility], [place] - [address] AT [place]  3. Do they need a 30 day or 90 day supply? 90 day  Patient is completely out of medication.

## 2022-01-06 ENCOUNTER — Telehealth: Payer: Self-pay

## 2022-01-06 MED ORDER — PROAIR HFA 108 (90 BASE) MCG/ACT IN AERS: 2.0000 | INHALATION_SPRAY | RESPIRATORY_TRACT | 1 refills | Status: DC | PRN

## 2022-01-06 NOTE — Telephone Encounter (Signed)
Patient called in - DOB/Pharmacy verified - requesting medication refill on ProAir inhaler - Rx never received at pharmacy.  Electronically sent in ProAir inhaler to pharmacy on file.

## 2022-01-14 MED ORDER — VENTOLIN HFA 108 (90 BASE) MCG/ACT IN AERS: 2.0000 | INHALATION_SPRAY | RESPIRATORY_TRACT | 1 refills | Status: DC | PRN

## 2022-01-14 NOTE — Addendum Note (Signed)
Addended by: Herbie Drape on: 01/14/2022 11:46 AM   Modules accepted: Orders

## 2022-01-14 NOTE — Telephone Encounter (Signed)
Patient called stating that she called last week about refilling her Proair and she stated that the pharmacy is saying there is missing information. I informed the patient that it was sent in on 01/06/2022 however unfortunately Proair is no longer available and we would send in Ventolin instead. Patient verbalized understanding. Prescription has been sent to the requested pharmacy.

## 2022-02-04 ENCOUNTER — Telehealth: Payer: Self-pay
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
## 2022-02-05 ENCOUNTER — Encounter: Payer: Self-pay | Admitting: Allergy

## 2022-02-05 ENCOUNTER — Ambulatory Visit (INDEPENDENT_AMBULATORY_CARE_PROVIDER_SITE_OTHER): Payer: Medicare Other | Admitting: Allergy

## 2022-02-05 VITALS — BP 124/78 | HR 103 | Temp 98.2°F | Resp 20 | Ht 59.0 in | Wt 180.2 lb

## 2022-02-05 DIAGNOSIS — R209 Unspecified disturbances of skin sensation: Secondary | ICD-10-CM | POA: Insufficient documentation

## 2022-02-05 DIAGNOSIS — Z860100 Personal history of colon polyps, unspecified: Secondary | ICD-10-CM | POA: Insufficient documentation

## 2022-02-05 DIAGNOSIS — R198 Other specified symptoms and signs involving the digestive system and abdomen: Secondary | ICD-10-CM | POA: Insufficient documentation

## 2022-02-05 DIAGNOSIS — M069 Rheumatoid arthritis, unspecified: Secondary | ICD-10-CM | POA: Insufficient documentation

## 2022-02-05 DIAGNOSIS — J4550 Severe persistent asthma, uncomplicated: Secondary | ICD-10-CM

## 2022-02-05 DIAGNOSIS — R1013 Epigastric pain: Secondary | ICD-10-CM | POA: Insufficient documentation

## 2022-02-05 DIAGNOSIS — R1310 Dysphagia, unspecified: Secondary | ICD-10-CM | POA: Insufficient documentation

## 2022-02-05 DIAGNOSIS — M19072 Primary osteoarthritis, left ankle and foot: Secondary | ICD-10-CM | POA: Insufficient documentation

## 2022-02-05 DIAGNOSIS — C189 Malignant neoplasm of colon, unspecified: Secondary | ICD-10-CM | POA: Insufficient documentation

## 2022-02-05 DIAGNOSIS — J302 Other seasonal allergic rhinitis: Secondary | ICD-10-CM | POA: Insufficient documentation

## 2022-02-05 DIAGNOSIS — N2889 Other specified disorders of kidney and ureter: Secondary | ICD-10-CM | POA: Insufficient documentation

## 2022-02-05 DIAGNOSIS — I872 Venous insufficiency (chronic) (peripheral): Secondary | ICD-10-CM | POA: Insufficient documentation

## 2022-02-05 MED ORDER — FLUTICASONE PROPIONATE HFA 220 MCG/ACT IN AERO: 2.0000 | INHALATION_SPRAY | Freq: Two times a day (BID) | RESPIRATORY_TRACT | 5 refills | Status: DC

## 2022-02-05 NOTE — Progress Notes (Signed)
Follow-up Note  RE: [name] MRN: [id number] DOB: 05-08-1956 Date of Office Visit: 02/05/2022   History of present illness: Kendra Gilbert is a 66 y.o. female presenting today for discussion regarding her asthma and jury duty.  She was last seen in the office on 12/30/2021 at which time she was in the midst of an asthma exacerbation.  I treated her at that time with a Depo-Medrol injection.  She states it did help with her asthma symptoms and also help with her back pain but several hours after the injection that was given in her right buttocks.  She did notice some numbness and weakness of the right leg.  She states she was unable to use the right leg.  These symptoms did resolve over 2 to 3 days or so without any intervention. The air quality is still causing issues with her breathing however when she has to go outside.  She also states when she is around a lot of people where she cannot control what she smells or gets exposed to can also drive symptoms.  She states going places like church can cause her issues whether lots of exposures there.  She does still use mask when she is out in public. She is using albuterol 2-3 times a week at this time.  This has been the worst summer for her.  She continues to take her symbicort early morning then 40 min -1 hour later will use the flovent.  She will take the Symbicort again around 5ish in the afternooon followed by the Flovent again 1 hour later.  She has continued to take the Flovent as she does feel it provides an extra boost in regards to her breathing control.  Review of systems: Review of Systems  Constitutional: Negative.   HENT: Negative.    Eyes: Negative.   Respiratory:  Positive for cough and shortness of breath.   Cardiovascular: Negative.   Gastrointestinal: Negative.   Musculoskeletal: Negative.   Skin: Negative.   Allergic/Immunologic: Negative.   Neurological: Negative.      All other systems negative unless noted  above in HPI  Past medical/social/surgical/family history have been reviewed and are unchanged unless specifically indicated below.  No changes  Medication List: Current Outpatient Medications  Medication Sig Dispense Refill   acetaminophen (TYLENOL) 500 MG tablet Take 325 mg by mouth daily as needed for mild pain, fever or headache.     diphenhydrAMINE (BENADRYL) 25 MG tablet Take 25 mg by mouth every 6 (six) hours as needed.     EPINEPHrine 0.3 mg/0.3 mL IJ SOAJ injection Inject 0.3 mg into the muscle as needed for anaphylaxis. 2 each 1   fluticasone (FLOVENT HFA) 110 MCG/ACT inhaler Inhale two puffs twice daily if needed to prevent cough or wheeze.  Rinse, gargle, and spit after use. 12 g 5   ipratropium-albuterol (DUONEB) 0.5-2.5 (3) MG/3ML SOLN Use 1 vial via nebulizer every 4-6 hours as needed for cough, wheeze, shortness of breath or chest tightness 180 mL 1   levocetirizine (XYZAL) 5 MG tablet Xyzal 5 mg tablet  Take 1 tablet every day by oral route.     losartan (COZAAR) 25 MG tablet TAKE 1 TABLET(25 MG) BY MOUTH IN THE MORNING AND AT BEDTIME 180 tablet 3   pantoprazole (PROTONIX) 20 MG tablet Take 20 mg by mouth daily.     pravastatin (PRAVACHOL) 80 MG tablet Take 1 tablet (80 mg total) by mouth daily. 90 tablet 3   Sodium Chloride-Sodium Bicarb  1.57 g PACK Place into the nose.     SYMBICORT 160-4.5 MCG/ACT inhaler INHALE 2 PUFFS INTO THE LUNGS TWICE DAILY 10.2 g 5   VENTOLIN HFA 108 (90 Base) MCG/ACT inhaler Inhale 2 puffs into the lungs every 4 (four) hours as needed for wheezing or shortness of breath. 18 g 1   Current Facility-Administered Medications  Medication Dose Route Frequency Provider Last Rate Last Admin   omalizumab Arvid Right) injection 375 mg  375 mg Subcutaneous Q14 Days Kennith Gain, MD   375 mg at 11/24/20 0830     Known medication allergies: Allergies  Allergen Reactions   Amlodipine Shortness Of Breath and Swelling   Bee Venom Anaphylaxis and  Hives   Lisinopril Itching and Cough    Dry cough and severe itching in the throat    Other Other (See Comments)    Mosquito bites cause hives all over body   Aspirin Other (See Comments)    Causes stomach problems   Cefdinir     Other reaction(s): hives   Erythromycin Other (See Comments)    Ate the lining of her stomach   Hctz [Hydrochlorothiazide]     Rash    Penicillins Rash    Has patient had a PCN reaction causing immediate rash, facial/tongue/throat swelling, SOB or lightheadedness with hypotension: Yes Has patient had a PCN reaction causing severe rash involving mucus membranes or skin necrosis: Yes Has patient had a PCN reaction that required hospitalization: No  Has patient had a PCN reaction occurring within the last 10 years: No If all of the above answers are "NO", then may proceed with Cephalosporin use.      Physical examination: Blood pressure 124/78, pulse (!) 103, temperature 98.2 F (36.8 C), resp. rate 20, height '4\' 11"'$  (1.499 m), weight 180 lb 3.2 oz (81.7 kg), SpO2 97 %.  General: Alert, interactive, in no acute distress. HEENT: PERRLA, TMs pearly gray, turbinates minimally edematous without discharge, post-pharynx non erythematous. Neck: Supple without lymphadenopathy. Lungs: Mildly decreased breath sounds bilaterally without wheezing, rhonchi or rales. {no increased work of breathing. CV: Normal S1, S2 without murmurs. Abdomen: Nondistended, nontender. Skin: Warm and dry, without lesions or rashes. Extremities:  No clubbing, cyanosis or edema. Neuro:   Grossly intact.  Diagnositics/Labs:  Spirometry: FEV1: 1.27 L, FVC: 1.52 L predicted.  This is a much better spirometry than her previous when she was exacerbated.  Assessment and plan: Severe persistent asthma   - continue avoidance measures for grass pollens, weed pollens, tree pollens, mold, dust mites - continue Xyzal '5mg'$  daily.      - have access to albuterol inhaler 2 puffs or albuterol 1  vial via nebulizer every 4-6 hours as needed for cough/wheeze/shortness of breath/chest tightness.  May use 15-20 minutes prior to activity.   Monitor frequency of use.    Use inhaler with spacer device.   - continue Symbicort 150mg 2 puffs twice a day - use Flovent 1187m 2 puffs twice a day for now until you run out then will increase to Flovent to 20 mcg 2 puffs twice a day to replace your current Flovent dose - recommend use of flutter valve device which helps to loosen/mobilize mucus from chest  - discussed that I am not sure why she had the numbness and weakness and inability to move the right leg after receiving the Depo-Medrol injection.  It is potentially possible that a nerve was hit with the injection but not happy to hear that it did self  resolve.  -I do recommend that if she can be released from jury duty that would be in her best interest as she does have quite severe asthma that has been not well controlled this summer due to a variety of different triggers including the heat and air quality especially.  I have provided with a letter that she can provide to records stating her diagnosis and severity of symptoms as well as need to be able to further use her albuterol or other asthma therapies if she has symptoms.  Asthma control goals:  Full participation in all desired activities (may need albuterol before activity) Albuterol use two time or less a week on average (not counting use with activity) Cough interfering with sleep two time or less a month Oral steroids no more than once a year No hospitalizations   Follow-up in 4-6 months or sooner if needed  I appreciate the opportunity to take part in Tory's care. Please do not hesitate to contact me with questions.  Sincerely,   Prudy Feeler, MD Allergy/Immunology [facility]

## 2022-02-05 NOTE — Patient Instructions (Addendum)
-   continue avoidance measures for grass pollens, weed pollens, tree pollens, mold, dust mites - continue Xyzal '5mg'$  daily.      - have access to albuterol inhaler 2 puffs or albuterol 1 vial via nebulizer every 4-6 hours as needed for cough/wheeze/shortness of breath/chest tightness.  May use 15-20 minutes prior to activity.   Monitor frequency of use.    Use inhaler with spacer device.   - continue Symbicort 162mg 2 puffs twice a day - use Flovent 1115m 2 puffs twice a day for now until you run out then will increase to Flovent to 20 mcg 2 puffs twice a day to replace your current Flovent dose - recommend use of flutter valve device which helps to loosen/mobilize mucus from chest  Asthma control goals:  Full participation in all desired activities (may need albuterol before activity) Albuterol use two time or less a week on average (not counting use with activity) Cough interfering with sleep two time or less a month Oral steroids no more than once a year No hospitalizations   Follow-up in 4-6 months or sooner if needed

## 2022-02-08 ENCOUNTER — Telehealth: Payer: Self-pay | Admitting: Allergy

## 2022-02-08 NOTE — Telephone Encounter (Signed)
Spoke with patient, informed her that letter is upfront ready for pick up. Patient verbalized understanding.

## 2022-02-08 NOTE — Telephone Encounter (Signed)
Pt called about a letter she waas needing and wondered if she needed to pick it up or if it was mailed.

## 2022-03-02 ENCOUNTER — Other Ambulatory Visit: Payer: Self-pay

## 2022-03-02 ENCOUNTER — Ambulatory Visit (HOSPITAL_COMMUNITY)
Admission: EM | Admit: 2022-03-02 | Discharge: 2022-03-02 | Disposition: A | Discharge status: Home / Self Care | Payer: Medicare Other | Attending: Family Medicine | Admitting: Family Medicine

## 2022-03-02 ENCOUNTER — Emergency Department (HOSPITAL_COMMUNITY): Payer: Medicare Other

## 2022-03-02 ENCOUNTER — Encounter (HOSPITAL_COMMUNITY): Payer: Self-pay

## 2022-03-02 ENCOUNTER — Emergency Department (HOSPITAL_COMMUNITY)
Admission: EM | Admit: 2022-03-02 | Discharge: 2022-03-02 | Disposition: A | Discharge status: Home / Self Care | Payer: Medicare Other | Attending: Emergency Medicine | Admitting: Emergency Medicine

## 2022-03-02 DIAGNOSIS — H5712 Ocular pain, left eye: Secondary | ICD-10-CM

## 2022-03-02 DIAGNOSIS — R519 Headache, unspecified: Secondary | ICD-10-CM | POA: Diagnosis not present

## 2022-03-02 DIAGNOSIS — H53149 Visual discomfort, unspecified: Secondary | ICD-10-CM | POA: Insufficient documentation

## 2022-03-02 DIAGNOSIS — H538 Other visual disturbances: Secondary | ICD-10-CM | POA: Diagnosis not present

## 2022-03-02 MED ORDER — TETRACAINE HCL 0.5 % OP SOLN
2.0000 [drp] | Freq: Once | OPHTHALMIC | Status: AC
Start: 2022-03-02 — End: 2022-03-02
  Administered 2022-03-02: 2 [drp] via OPHTHALMIC
  Filled 2022-03-02: qty 4

## 2022-03-02 MED ORDER — FLUORESCEIN SODIUM 1 MG OP STRP
1.0000 | ORAL_STRIP | Freq: Once | OPHTHALMIC | Status: DC
  Filled 2022-03-02: qty 1

## 2022-03-02 NOTE — Discharge Instructions (Signed)
You were seen today for headache, eye pressure, and vision changes.  Please go to the ER for further evaluation given your symptoms.

## 2022-03-02 NOTE — ED Provider Notes (Signed)
[facility] EMERGENCY DEPARTMENT Provider Note   CSN: [id number] Arrival date & time: 03/02/22  7829     History  Chief Complaint  Patient presents with   Eye Pain    Kendra Gilbert is a 66 y.o. female.  This is a 66 year old female who presents with left eye pain which occurs when she tries to focus.  Has had a mild headache with out vomiting or diarrhea.  Slight photophobia.  States that this is been going on for several weeks.  Symptoms get worse when she allows her eyes to rest.  Denies any eye drainage.  No vision loss.  No cloudiness to her vision.  No prior history of glaucoma.  No recent history of eye injuries.  Does not wear contact lens.  Went to urgent care and sent here for further evaluation       Home Medications Prior to Admission medications   Medication Sig Start Date End Date Taking? Authorizing Provider  acetaminophen (TYLENOL) 500 MG tablet Take 325 mg by mouth daily as needed for mild pain, fever or headache.    [provider]  diphenhydrAMINE (BENADRYL) 25 MG tablet Take 25 mg by mouth every 6 (six) hours as needed.    [provider]  EPINEPHrine 0.3 mg/0.3 mL IJ SOAJ injection Inject 0.3 mg into the muscle as needed for anaphylaxis. 05/06/21   Kennith Gain, MD  fluticasone (FLOVENT HFA) 220 MCG/ACT inhaler Inhale 2 puffs into the lungs 2 (two) times daily. 02/05/22   Kennith Gain, MD  ipratropium-albuterol (DUONEB) 0.5-2.5 (3) MG/3ML SOLN Use 1 vial via nebulizer every 4-6 hours as needed for cough, wheeze, shortness of breath or chest tightness 11/13/21   Kennith Gain, MD  levocetirizine (XYZAL) 5 MG tablet Xyzal 5 mg tablet  Take 1 tablet every day by oral route.    [provider]  losartan (COZAAR) 25 MG tablet TAKE 1 TABLET(25 MG) BY MOUTH IN THE MORNING AND AT BEDTIME 10/28/21   Minus Breeding, MD  pantoprazole (PROTONIX) 20 MG tablet Take 20 mg by mouth daily. 10/27/21    [provider]  pravastatin (PRAVACHOL) 80 MG tablet Take 1 tablet (80 mg total) by mouth daily. 01/04/22   Minus Breeding, MD  Sodium Chloride-Sodium Bicarb 1.57 g PACK Place into the nose.    [provider]  SYMBICORT 160-4.5 MCG/ACT inhaler INHALE 2 PUFFS INTO THE LUNGS TWICE DAILY 11/27/21   Kennith Gain, MD  VENTOLIN HFA 108 [id number] Base) MCG/ACT inhaler Inhale 2 puffs into the lungs every 4 (four) hours as needed for wheezing or shortness of breath. 01/14/22   Kennith Gain, MD      Allergies    Amlodipine, Bee venom, Lisinopril, Other, Aspirin, Cefdinir, Erythromycin, Hctz [hydrochlorothiazide], and Penicillins    Review of Systems   Review of Systems  All other systems reviewed and are negative.   Physical Exam Updated Vital Signs BP (!) 165/70 (BP Location: Left Arm)   Pulse 81   Temp 98.5 F (36.9 C) (Oral)   Resp 17   SpO2 99%  Physical Exam Vitals and nursing note reviewed.  Constitutional:      General: She is not in acute distress.    Appearance: Normal appearance. She is well-developed. She is not toxic-appearing.  HENT:     Head: Normocephalic and atraumatic.  Eyes:     General: Lids are normal.     Intraocular pressure: Left eye pressure is 16 mmHg. Measurements  were taken using a handheld tonometer.    Conjunctiva/sclera: Conjunctivae normal.     Right eye: Right conjunctiva is not injected. No chemosis.    Left eye: Left conjunctiva is not injected. No chemosis or exudate.    Pupils: Pupils are equal, round, and reactive to light.  Neck:     Thyroid: No thyroid mass.     Trachea: No tracheal deviation.  Cardiovascular:     Rate and Rhythm: Normal rate and regular rhythm.     Heart sounds: Normal heart sounds. No murmur heard.    No gallop.  Pulmonary:     Effort: Pulmonary effort is normal. No respiratory distress.     Breath sounds: Normal breath sounds. No stridor. No decreased breath sounds, wheezing, rhonchi  or rales.  Abdominal:     General: There is no distension.     Palpations: Abdomen is soft.     Tenderness: There is no abdominal tenderness. There is no rebound.  Musculoskeletal:        General: No tenderness. Normal range of motion.     Cervical back: Normal range of motion and neck supple.  Skin:    General: Skin is warm and dry.     Findings: No abrasion or rash.  Neurological:     Mental Status: She is alert and oriented to person, place, and time. Mental status is at baseline.     GCS: GCS eye subscore is 4. GCS verbal subscore is 5. GCS motor subscore is 6.     Cranial Nerves: No cranial nerve deficit.     Sensory: No sensory deficit.     Motor: Motor function is intact.  Psychiatric:        Attention and Perception: Attention normal.        Speech: Speech normal.        Behavior: Behavior normal.     ED Results / Procedures / Treatments   Labs (all labs ordered are listed, but only abnormal results are displayed) Labs Reviewed - No data to display  EKG None  Radiology CT Head Wo Contrast  Result Date: 03/02/2022 CLINICAL DATA:  Headache, new or worsening (Age >= 50y) EXAM: CT HEAD WITHOUT CONTRAST TECHNIQUE: Contiguous axial images were obtained from the base of the skull through the vertex without intravenous contrast. RADIATION DOSE REDUCTION: This exam was performed according to the departmental dose-optimization program which includes automated exposure control, adjustment of the mA and/or kV according to patient size and/or use of iterative reconstruction technique. COMPARISON:  None Available. FINDINGS: Brain: No acute intracranial abnormality. Specifically, no hemorrhage, hydrocephalus, mass lesion, acute infarction, or significant intracranial injury. Vascular: No hyperdense vessel or unexpected calcification. Skull: No acute calvarial abnormality. Sinuses/Orbits: No acute findings Other: None IMPRESSION: Normal study. Electronically Signed   By: Rolm Baptise M.D.    On: 03/02/2022 10:05    Procedures Procedures    Medications Ordered in ED Medications  fluorescein ophthalmic strip 1 strip (has no administration in time range)  tetracaine (PONTOCAINE) 0.5 % ophthalmic solution 2 drop (has no administration in time range)    ED Course/ Medical Decision Making/ A&P                           Medical Decision Making  Head CT per my review interpretation was negative.  Concern for possible glaucoma.  Tono-Pen had a intraocular pressure of 16.  Globe is not tender to palpation.  Her cornea is  clear.  Do not feel that she has this.  She has a normal ocular exam at this time.  Patient's last prescription was over a year ago.  Suspect this may need a new 1.  Do not feel that she needs any other work-up at this time.  We will give ophthalmology referral        Final Clinical Impression(s) / ED Diagnoses Final diagnoses:  None    Rx / DC Orders ED Discharge Orders     None         Lacretia Leigh, MD 03/02/22 1156

## 2022-03-02 NOTE — ED Notes (Signed)
Patient is being discharged from the Urgent Care and sent to the Emergency Department via personal vehicle . Per Dr.Piontek, patient is in need of higher level of care due to head pain with history of head injury. Patient is aware and verbalizes understanding of plan of care.  Vitals:   03/02/22 0821  BP: (!) 156/82  Pulse: 84  Resp: 16  Temp: 98.2 F (36.8 C)  SpO2: 96%

## 2022-03-02 NOTE — ED Provider Notes (Signed)
[place]    CSN: [id number] Arrival date & time: 03/02/22  0803      History   Chief Complaint Chief Complaint  Patient presents with   Eye Pain   Headache    HPI Kendra Gilbert is a 66 y.o. female.   Patient is here for 3 days of pain around the left eye area.  The pain has increased.  Pressure, the eye is teary, and then pain start.  She has to close her eyes.  Worse with reading.  She then has head pain on the left side.  Mild nausea, vomiting.  + blurriness at the left eye.  She did fall in October 2022, and worried that caused some damage.   She does have h/o migraines many years ago and this feel different.   Past Medical History:  Diagnosis Date   Asthma    Chronic kidney disease    cyst in left kidney   Colon cancer ([place]) 2017   surgical tx  removal of polyp   Coronary artery calcification    a. POET in 2015 with no acute findings.   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   no current issues with   Pneumonia 2016   Rheumatoid arthritis ([place])    oa also, back arthritis, hx rheumatoid     Patient Active Problem List   Diagnosis Date Noted   Dyspepsia 02/05/2022   Dysphagia 02/05/2022   Malignant tumor of colon ([place]) 02/05/2022   Other seasonal allergic rhinitis 02/05/2022   Other specified disorders of kidney and ureter 02/05/2022   Other specified symptoms and signs involving the digestive system and abdomen 02/05/2022   Peripheral venous insufficiency 02/05/2022   Personal history of colonic polyps 02/05/2022   Primary osteoarthritis, left ankle and foot 02/05/2022   Rheumatoid arthritis ([place]) 02/05/2022   Skin sensation disturbance 02/05/2022   Leg swelling  12/15/2021   Partial thickness rotator cuff tear 09/09/2021   Impingement syndrome of left shoulder region 08/22/2021   Osteoarthritis of acromioclavicular joint 08/22/2021   Sinusitis 08/05/2021   Pain in joint of left elbow 06/16/2021   Pain in joint of left shoulder 06/16/2021   Erosive osteoarthritis of both hands 12/19/2020   History of rheumatoid arthritis 12/05/2020   Bilateral hand pain 12/05/2020   Tibialis posterior tendinitis 06/30/2020   Pain in left foot 06/30/2020   Acquired short Achilles tendon of left lower extremity 06/30/2020   Radial styloid tenosynovitis of right hand 06/17/2020   Pain and swelling of right wrist 05/02/2020   Localized edema 08/25/2019   Educated about COVID-19 virus infection 08/25/2019   Alternating constipation and diarrhea 04/23/2019   Blood in feces 04/23/2019   Epigastric pain 04/23/2019   Gastroesophageal reflux disease 04/23/2019   Hematochezia 04/23/2019   Hypercholesterolemia 04/23/2019   Irritable bowel syndrome without diarrhea 04/23/2019   Polyp of colon 04/23/2019   Essential hypertension 02/14/2019   Fibromyalgia 01/16/2019   Primary osteoarthritis involving multiple joints 01/16/2019   DDD (degenerative disc disease), cervical 09/20/2018   Chest pain 02/17/2016   Dyslipidemia 02/17/2016   Family history of heart disease  Dyspnea 01/20/2016   Multiple pulmonary nodules determined by computed tomography of lung 01/20/2016   Unspecified osteoarthritis, unspecified site 11/16/2013   Coronary artery calcification 08/23/2013   Upper airway cough syndrome 08/19/2013   Exertional chest pain 08/19/2013   Restless leg syndrome 11/14/2012   Sleep apnea 10/03/2012   Interstitial lung disease ([place]) 04/12/2011   Wheezing 04/12/2011    Past Surgical History:  Procedure Laterality Date   ABDOMINAL HYSTERECTOMY     ovaries left   ANKLE SURGERY Left 02/12/2021   COLONOSCOPY WITH PROPOFOL N/A 03/23/2017   Procedure: COLONOSCOPY  WITH PROPOFOL;  Surgeon: Arta Silence, MD;  Location: [facility];  Service: Endoscopy;  Laterality: N/A;   colonscopy  2017   DILATION AND CURETTAGE OF UTERUS     few done for endometriosis   GASTROC RECESSION EXTREMITY Left 02/12/2021   Procedure: Left gastroc recession;  Surgeon: Wylene Simmer, MD;  Location: [facility];  Service: Orthopedics;  Laterality: Left;   ROTATOR CUFF REPAIR  09/2021   athroscopic and open    OB History   No obstetric history on file.      Home Medications    Prior to Admission medications   Medication Sig Start Date End Date Taking? Authorizing Provider  acetaminophen (TYLENOL) 500 MG tablet Take 325 mg by mouth daily as needed for mild pain, fever or headache.   Yes [provider]  ipratropium-albuterol (DUONEB) 0.5-2.5 (3) MG/3ML SOLN Use 1 vial via nebulizer every 4-6 hours as needed for cough, wheeze, shortness of breath or chest tightness 11/13/21  Yes Padgett, Rae Halsted, MD  levocetirizine (XYZAL) 5 MG tablet Xyzal 5 mg tablet  Take 1 tablet every day by oral route.   Yes [provider]  losartan (COZAAR) 25 MG tablet TAKE 1 TABLET(25 MG) BY MOUTH IN THE MORNING AND AT BEDTIME 10/28/21  Yes Hochrein, Jeneen Rinks, MD  pantoprazole (PROTONIX) 20 MG tablet Take 20 mg by mouth daily. 10/27/21  Yes [provider]  pravastatin (PRAVACHOL) 80 MG tablet Take 1 tablet (80 mg total) by mouth daily. 01/04/22  Yes Minus Breeding, MD  Sodium Chloride-Sodium Bicarb 1.57 g PACK Place into the nose.   Yes [provider]  SYMBICORT 160-4.5 MCG/ACT inhaler INHALE 2 PUFFS INTO THE LUNGS TWICE DAILY 11/27/21  Yes Padgett, Rae Halsted, MD  VENTOLIN HFA 108 (90 Base) MCG/ACT inhaler Inhale 2 puffs into the lungs every 4 (four) hours as needed for wheezing or shortness of breath. 01/14/22  Yes Padgett, Rae Halsted, MD  diphenhydrAMINE (BENADRYL) 25 MG tablet Take 25 mg by mouth every 6 (six) hours as needed.     [provider]  EPINEPHrine 0.3 mg/0.3 mL IJ SOAJ injection Inject 0.3 mg into the muscle as needed for anaphylaxis. 05/06/21   Kennith Gain, MD  fluticasone (FLOVENT HFA) 220 MCG/ACT inhaler Inhale 2 puffs into the lungs 2 (two) times daily. 02/05/22   Kennith Gain, MD    Family History Family History  Problem Relation Age of Onset   CAD Mother 25       CABG/stents   Breast cancer Mother    Kidney failure Mother    Asthma Father    CAD Father 57   Allergies Father    CAD Brother    CAD Brother 7       9 stents   CAD Brother 20       7 stents   Heart attack Brother     Social History  Social History   Tobacco Use   Smoking status: Never   Smokeless tobacco: Never  Vaping Use   Vaping Use: Never used  Substance Use Topics   Alcohol use: No   Drug use: Never     Allergies   Amlodipine, Bee venom, Lisinopril, Other, Aspirin, Cefdinir, Erythromycin, Hctz [hydrochlorothiazide], and Penicillins   Review of Systems Review of Systems  Eyes:  Positive for pain.  Neurological:  Positive for headaches.     Physical Exam Triage Vital Signs ED Triage Vitals  Enc Vitals Group     BP 03/02/22 0821 (!) 156/82     Pulse Rate 03/02/22 0821 84     Resp 03/02/22 0821 16     Temp 03/02/22 0821 98.2 F (36.8 C)     Temp Source 03/02/22 0821 Oral     SpO2 03/02/22 0821 96 %     Weight 03/02/22 0823 180 lb 1.9 oz (81.7 kg)     Height 03/02/22 0823 '4\' 11"'$  (1.499 m)     Head Circumference --      Peak Flow --      Pain Score 03/02/22 0823 8     Pain Loc --      Pain Edu? --      Excl. in [place]? --    No data found.  Updated Vital Signs BP (!) 156/82 (BP Location: Right Arm)   Pulse 84   Temp 98.2 F (36.8 C) (Oral)   Resp 16   Ht '4\' 11"'$  (1.499 m)   Wt 81.7 kg   SpO2 96%   BMI 36.38 kg/m   Visual Acuity Right Eye Distance:   Left Eye Distance:   Bilateral Distance:    Right Eye Near:   Left Eye Near:    Bilateral Near:      Physical Exam Constitutional:      Appearance: She is well-developed.  HENT:     Head: Normocephalic.     Comments: TTP to the left temporal area Eyes:     General: Lids are normal.     Extraocular Movements: Extraocular movements intact.     Conjunctiva/sclera: Conjunctivae normal.     Comments: +blurry vision on the left;  sensative to light bilaterally  Cardiovascular:     Rate and Rhythm: Normal rate.  Pulmonary:     Effort: Pulmonary effort is normal.     Breath sounds: Normal breath sounds.  Musculoskeletal:     Cervical back: Normal range of motion and neck supple.  Lymphadenopathy:     Cervical: No cervical adenopathy.  Neurological:     Mental Status: She is alert and oriented to person, place, and time.     Cranial Nerves: No cranial nerve deficit or facial asymmetry.  Psychiatric:        Mood and Affect: Mood normal.        Behavior: Behavior normal.      UC Treatments / Results  Labs (all labs ordered are listed, but only abnormal results are displayed) Labs Reviewed - No data to display  EKG   Radiology No results found.  Procedures Procedures (including critical care time)  Medications Ordered in UC Medications - No data to display  Initial Impression / Assessment and Plan / UC Course  I have reviewed the triage vital signs and the nursing notes.  Pertinent labs & imaging results that were available during my care of the patient were reviewed by me and considered in my medical decision making (see chart  for details).  Given patients symptoms I have advised her to go to the ER for further evaluation.  This does not seem typical for migraine.  Differential includes cluster headache vs temporal arteritis vs other.  She is aware and agrees with this plan   Final Clinical Impressions(s) / UC Diagnoses   Final diagnoses:  Pain of left eye  Nonintractable headache, unspecified chronicity pattern, unspecified headache type  Blurry vision, left eye      Discharge Instructions      You were seen today for headache, eye pressure, and vision changes.  Please go to the ER for further evaluation given your symptoms.     ED Prescriptions   None    PDMP not reviewed this encounter.   Rondel Oh, MD 03/02/22 [phone]

## 2022-03-02 NOTE — ED Provider Triage Note (Signed)
Emergency Medicine Provider Triage Evaluation Note  Darlina Mccaughey , a 66 y.o. female  was evaluated in triage.  Pt complains of left eye pain.  Patient sent from urgent care for further evaluation.  Worsening pain and pressure around and behind the left eye for the past few days with associated blurred vision that is worse when reading or looking at screens.  She reports associated left-sided headache as well as some eye watering and nasal congestion.  Reports about a year ago she had a head injury and suffered a concussion and has had some ongoing symptoms from a and thought this may be related.  Reports some more mild symptoms in the right eye.  No facial swelling, fevers or chills.  Review of Systems  Positive: Eye pain, blurry vision, headache Negative: Numbness, weakness  Physical Exam  BP (!) 163/72 (BP Location: Right Arm)   Pulse 87   Temp 98.8 F (37.1 C) (Oral)   Resp 18   SpO2 98%  Gen:   Awake, no distress   Resp:  Normal effort  MSK:   Moves extremities without difficulty  Other:  PERRLA, photophobia, no facial droop movign all extremities  Medical Decision Making  Medically screening exam initiated at 9:18 AM.  Appropriate orders placed.  Aalia Greulich was informed that the remainder of the evaluation will be completed by another provider, this initial triage assessment does not replace that evaluation, and the importance of remaining in the ED until their evaluation is complete.     Jacqlyn Larsen, [place] 03/02/22 [phone]

## 2022-03-02 NOTE — ED Triage Notes (Signed)
Patient having a sharp pain in the left eye and pressure. Onset for a few days but has been getting worse. States the eye will water and this is creating a headache on the left side.   Patient states she had a concussion back in October. Patient states she had fallen forward and hit her forehead. States both eye were blackened.

## 2022-03-02 NOTE — ED Triage Notes (Signed)
Pt. Stated, Kendra Gilbert had increasing pain in left eye . I had a concussion last October and its probably from the concussion.

## 2022-03-02 NOTE — Discharge Instructions (Signed)
Call the eye doctor to schedule a follow-up visit.

## 2022-03-11 ENCOUNTER — Other Ambulatory Visit: Payer: Self-pay | Admitting: Family Medicine

## 2022-03-11 DIAGNOSIS — Z1231 Encounter for screening mammogram for malignant neoplasm of breast: Secondary | ICD-10-CM

## 2022-03-25 ENCOUNTER — Other Ambulatory Visit: Payer: Self-pay

## 2022-03-25 ENCOUNTER — Encounter: Payer: Self-pay | Admitting: Allergy

## 2022-03-25 ENCOUNTER — Ambulatory Visit (INDEPENDENT_AMBULATORY_CARE_PROVIDER_SITE_OTHER): Payer: Medicare Other | Admitting: Allergy

## 2022-03-25 VITALS — BP 136/88 | HR 77 | Temp 98.2°F | Resp 14 | Ht 59.0 in | Wt 186.2 lb

## 2022-03-25 DIAGNOSIS — J300 Vasomotor rhinitis: Secondary | ICD-10-CM | POA: Diagnosis not present

## 2022-03-25 DIAGNOSIS — H1012 Acute atopic conjunctivitis, left eye: Secondary | ICD-10-CM

## 2022-03-25 DIAGNOSIS — H1013 Acute atopic conjunctivitis, bilateral: Secondary | ICD-10-CM

## 2022-03-25 DIAGNOSIS — J4550 Severe persistent asthma, uncomplicated: Secondary | ICD-10-CM | POA: Diagnosis not present

## 2022-03-25 DIAGNOSIS — J3089 Other allergic rhinitis: Secondary | ICD-10-CM

## 2022-03-25 MED ORDER — LEVOCETIRIZINE DIHYDROCHLORIDE 5 MG PO TABS: 5.0000 mg | ORAL_TABLET | Freq: Every evening | ORAL | 1 refills | Status: DC

## 2022-03-25 NOTE — Patient Instructions (Addendum)
-   continue avoidance measures for grass pollens, weed pollens, tree pollens, mold, dust mites - continue Xyzal '5mg'$  daily.      - have access to albuterol inhaler 2 puffs or albuterol 1 vial via nebulizer every 4-6 hours as needed for cough/wheeze/shortness of breath/chest tightness.  May use 15-20 minutes prior to activity.   Monitor frequency of use.    Use inhaler with spacer device.   - at this time stop Symbicort.   Start Breztri 2 puffs twice a day.  This is a step-up in therapy.  Sample provided.  Let me know if you tolerate this inhaler and then will send in prescription.   - use Flovent 260mg 2 puffs twice a day  - recommend use of flutter valve device which helps to loosen/mobilize mucus from chest  Asthma control goals:  Full participation in all desired activities (may need albuterol before activity) Albuterol use two time or less a week on average (not counting use with activity) Cough interfering with sleep two time or less a month Oral steroids no more than once a year No hospitalizations   Follow-up in 4-6 months or sooner if needed

## 2022-03-25 NOTE — Progress Notes (Signed)
Follow-up Note  RE: Kendra Gilbert MRN: [id number] DOB: December 24, 1955 Date of Office Visit: 03/25/2022   History of present illness: Kendra Gilbert is a 66 y.o. female presenting today for follow-up of asthma and allergies.  She was last seen in the office on 02/05/22 by myself.   She states she is doing better since the last visit in regards to her breathing.  She does state she is having more issues with her sinuses and eyes.  With her sinuses she states since her fall about a year ago, her nasal passages are still painful; no nose bleeding.  Due to-I have had her not use any nasal sprays to decrease any further irritation and pain to the nose area.  When she blows her nose it creates more pain.  Also with more runny nose.  She does feel like the pain is escalating.  With her eyes she is dealing with dry eyes and cataracts as well as scar tissue mostly of the left eye.  She does not some blurriness to the left eye.  She does follow with her eye doctors. She is still have head pain on the left side.  She does have an appt with ENT later today.   She continues to take her Xyzal daily.  She is also using both Symbicort and Flovent for her respiratory control.  I discussed with her today about 2 years ago we had her try Judithann Sauger that she did not feel like it was more effective but she was having some stomach issues while she was taking it thus we went back to the Symbicort.  Her stomach issues are in a better place right now thus she is willing to try the Breztri again at this point.    Review of systems: Review of Systems  Constitutional: Negative.   HENT:         See HPI  Eyes:        See HPI  Respiratory: Negative.    Cardiovascular: Negative.   Gastrointestinal: Negative.   Musculoskeletal: Negative.   Skin: Negative.   Allergic/Immunologic: Negative.   Neurological: Negative.      All other systems negative unless noted above in HPI  Past medical/social/surgical/family history  have been reviewed and are unchanged unless specifically indicated below.  No changes  Medication List: Current Outpatient Medications  Medication Sig Dispense Refill   acetaminophen (TYLENOL) 500 MG tablet Take 325 mg by mouth daily as needed for mild pain, fever or headache.     diphenhydrAMINE (BENADRYL) 25 MG tablet Take 25 mg by mouth every 6 (six) hours as needed.     EPINEPHrine 0.3 mg/0.3 mL IJ SOAJ injection Inject 0.3 mg into the muscle as needed for anaphylaxis. 2 each 1   fluticasone (FLOVENT HFA) 220 MCG/ACT inhaler Inhale 2 puffs into the lungs 2 (two) times daily. 1 each 5   ipratropium-albuterol (DUONEB) 0.5-2.5 (3) MG/3ML SOLN Use 1 vial via nebulizer every 4-6 hours as needed for cough, wheeze, shortness of breath or chest tightness 180 mL 1   levocetirizine (XYZAL) 5 MG tablet Xyzal 5 mg tablet  Take 1 tablet every day by oral route.     losartan (COZAAR) 25 MG tablet TAKE 1 TABLET(25 MG) BY MOUTH IN THE MORNING AND AT BEDTIME 180 tablet 3   pantoprazole (PROTONIX) 20 MG tablet Take 20 mg by mouth daily.     pravastatin (PRAVACHOL) 80 MG tablet Take 1 tablet (80 mg total) by mouth daily. 90 tablet 3  prednisoLONE acetate (PRED FORTE) 1 % ophthalmic suspension SMARTSIG:In Eye(s)     RESTASIS 0.05 % ophthalmic emulsion 1 drop 2 (two) times daily.     Sodium Chloride-Sodium Bicarb 1.57 g PACK Place into the nose.     SYMBICORT 160-4.5 MCG/ACT inhaler INHALE 2 PUFFS INTO THE LUNGS TWICE DAILY 10.2 g 5   VENTOLIN HFA 108 (90 Base) MCG/ACT inhaler Inhale 2 puffs into the lungs every 4 (four) hours as needed for wheezing or shortness of breath. 18 g 1   Current Facility-Administered Medications  Medication Dose Route Frequency Provider Last Rate Last Admin   omalizumab Arvid Right) injection 375 mg  375 mg Subcutaneous Q14 Days Kennith Gain, MD   375 mg at 11/24/20 0830     Known medication allergies: Allergies  Allergen Reactions   Amlodipine Shortness Of Breath  and Swelling   Bee Venom Anaphylaxis and Hives   Lisinopril Itching and Cough    Dry cough and severe itching in the throat    Other Other (See Comments)    Mosquito bites cause hives all over body   Aspirin Other (See Comments)    Causes stomach problems   Cefdinir     Other reaction(s): hives   Erythromycin Other (See Comments)    Ate the lining of her stomach   Hctz [Hydrochlorothiazide]     Rash    Penicillins Rash    Has patient had a PCN reaction causing immediate rash, facial/tongue/throat swelling, SOB or lightheadedness with hypotension: Yes Has patient had a PCN reaction causing severe rash involving mucus membranes or skin necrosis: Yes Has patient had a PCN reaction that required hospitalization: No  Has patient had a PCN reaction occurring within the last 10 years: No If all of the above answers are "NO", then may proceed with Cephalosporin use.      Physical examination: Blood pressure 136/88, pulse 77, temperature 98.2 F (36.8 C), resp. rate 14, height '4\' 11"'$  (1.499 m), weight 186 lb 3.2 oz (84.5 kg), SpO2 97 %.  General: Alert, interactive, in no acute distress. HEENT: PERRLA, TMs pearly gray, turbinates minimally edematous without discharge, post-pharynx non erythematous. Neck: Supple without lymphadenopathy. Lungs: Mildly decreased breath sounds bilaterally without wheezing, rhonchi or rales. {no increased work of breathing. CV: Normal S1, S2 without murmurs. Abdomen: Nondistended, nontender. Skin: Warm and dry, without lesions or rashes. Extremities:  No clubbing, cyanosis or edema. Neuro:   Grossly intact.  Diagnositics/Labs:  Spirometry: FEV1: 1.06L 54%, FVC: 1.28L 52% predicted.  This is relatively stable for her.  Assessment and plan: Severe persistent asthma Allergic rhinitis with conjunctivitis Vasomotor rhinitis   - continue avoidance measures for grass pollens, weed pollens, tree pollens, mold, dust mites - continue Xyzal '5mg'$  daily.       - have access to albuterol inhaler 2 puffs or albuterol 1 vial via nebulizer every 4-6 hours as needed for cough/wheeze/shortness of breath/chest tightness.  May use 15-20 minutes prior to activity.   Monitor frequency of use.    Use inhaler with spacer device.   - at this time stop Symbicort.   Start Breztri 2 puffs twice a day.  This is a step-up in therapy.  Sample provided.  Let me know if you tolerate this inhaler and then will send in prescription.   - use Flovent 252mg 2 puffs twice a day  - recommend use of flutter valve device which helps to loosen/mobilize mucus from chest  Asthma control goals:  Full participation in all desired  activities (may need albuterol before activity) Albuterol use two time or less a week on average (not counting use with activity) Cough interfering with sleep two time or less a month Oral steroids no more than once a year No hospitalizations   Follow-up in 4-6 months or sooner if needed  I appreciate the opportunity to take part in Cade's care. Please do not hesitate to contact me with questions.  Sincerely,   Prudy Feeler, MD Allergy/Immunology [facility]

## 2022-03-26 ENCOUNTER — Encounter: Payer: Self-pay | Admitting: Neurology

## 2022-04-02 ENCOUNTER — Other Ambulatory Visit: Payer: Self-pay | Admitting: *Deleted

## 2022-04-02 ENCOUNTER — Telehealth: Payer: Self-pay | Admitting: Allergy

## 2022-04-02 MED ORDER — BREZTRI AEROSPHERE 160-9-4.8 MCG/ACT IN AERO: INHALATION_SPRAY | RESPIRATORY_TRACT | 5 refills | Status: DC

## 2022-04-02 NOTE — Telephone Encounter (Signed)
Patient is requesting refill for breztri.   Kendra Gilbert [id number]  Best contact number: [phone]

## 2022-04-02 NOTE — Telephone Encounter (Signed)
RX sent and patient informed.

## 2022-04-14 NOTE — Progress Notes (Signed)
Office Visit Note  Patient: Kendra Gilbert             Date of Birth: [birth year]           MRN: [id number]             PCP: Marda Stalker, PA-C Referring: Marda Stalker, PA-C Visit Date: 04/15/2022   Subjective:  Follow-up (Patient states she is having issues with left ankle with swelling and pain that shoots up to the left knee. )   History of Present Illness: Kendra Gilbert is a 66 y.o. female here for follow up for her inflammatory joint pain and history of RA with findings of erosive arthritis seen here last year. She has not been on long term DMARD treatment mostly doing pretty well with use of thumb brace and notices finger joint pains flaring up with weather changes. Her new issue bringing her today is increased pain and swelling in the left leg. Pain at the ankle with radiation up to her knee and swelling that is constantly present and worse as the day goes on. She tried wearing high sock without significant improvement. She had recent PT course with a good improvement in tendinitis. She saw Dr. Doran Durand with [facility] for this and was referred to neurology for evaluation but waiting for this.  Previous HPI 12/19/2020 Kendra Gilbert is a 66 y.o. female here for follow up for her inflammatory joint pain and history of RA. Lab testing at initial visit was negative for RA associated antibodies or ENA panel elements not already excluded. Her sedimentation rate was quite positive at 89. Xrays reviewed did not show much inflammatory arthritis change in her feet but hands showed DIP predominant change with early central erosions consistent with erosive OA. She has previously already tried joint injection without significant benefit, oral steroids with excessive weight gain, limited tolerance for NSAIDs with GI irritation and asthma symptoms, PT and OT limited benefit, and currently wears right wrist and left ankle braces.   12/05/20 She has reported histroy of RA since many  years ago not consistently on treatment. She has been noticing pain and swelling occurring in both hands both feet and ankles intermittently but can be more problematic over time.  Particular has had issues with the right elbow wrist and base of the thumb and in her left ankle which she has been seeing for posterior tibial tendon dysfunction.  She notices occasional swelling but mostly has had bony enlargement of distal finger joints but also in the right wrist and base of the thumb.  She underwent fairly recent injection of seems to be the right first [facility] joint but did not have a great amount of relief.  She has discussed with her orthopedic surgeons whether surgical management would be beneficial or if there is a recommendation for additional medical treatments of an inflammatory arthritis.  She is also been experiencing facial rashes for quite some time that she most often notices exacerbated by heat exposure such as outdoors hot showers and some increased with the mask wearing.     Review of Systems  Constitutional:  Positive for fatigue.  HENT:  Negative for mouth sores and mouth dryness.   Eyes:  Positive for dryness.  Respiratory:  Positive for shortness of breath.   Cardiovascular:  Positive for chest pain and palpitations.  Gastrointestinal:  Positive for constipation and diarrhea. Negative for blood in stool.  Endocrine: Positive for increased urination.  Genitourinary:  Negative for involuntary urination.  Musculoskeletal:  Positive  for joint pain, gait problem, joint pain, joint swelling, myalgias, muscle weakness, morning stiffness, muscle tenderness and myalgias.  Skin:  Positive for rash and sensitivity to sunlight. Negative for color change.  Allergic/Immunologic: Positive for susceptible to infections.  Neurological:  Positive for dizziness and headaches.  Hematological:  Negative for swollen glands.  Psychiatric/Behavioral:  Negative for depressed mood and sleep disturbance. The  patient is not nervous/anxious.     PMFS History:  Patient Active Problem List   Diagnosis Date Noted   Pain in left ankle and joints of left foot 04/15/2022   Dyspepsia 02/05/2022   Dysphagia 02/05/2022   Malignant tumor of colon ([place]) 02/05/2022   Other seasonal allergic rhinitis 02/05/2022   Other specified disorders of kidney and ureter 02/05/2022   Other specified symptoms and signs involving the digestive system and abdomen 02/05/2022   Peripheral venous insufficiency 02/05/2022   Personal history of colonic polyps 02/05/2022   Primary osteoarthritis, left ankle and foot 02/05/2022   Rheumatoid arthritis ([place]) 02/05/2022   Skin sensation disturbance 02/05/2022   Leg swelling 12/15/2021   Partial thickness rotator cuff tear 09/09/2021   Impingement syndrome of left shoulder region 08/22/2021   Osteoarthritis of acromioclavicular joint 08/22/2021   Sinusitis 08/05/2021   Pain in joint of left elbow 06/16/2021   Pain in joint of left shoulder 06/16/2021   Erosive osteoarthritis of both hands 12/19/2020   History of rheumatoid arthritis 12/05/2020   Bilateral hand pain 12/05/2020   Tibialis posterior tendinitis 06/30/2020   Pain in left foot 06/30/2020   Acquired short Achilles tendon of left lower extremity 06/30/2020   Radial styloid tenosynovitis of right hand 06/17/2020   Pain and swelling of right wrist 05/02/2020   Localized edema 08/25/2019   Educated about COVID-19 virus infection 08/25/2019   Alternating constipation and diarrhea 04/23/2019   Blood in feces 04/23/2019   Epigastric pain 04/23/2019   Gastroesophageal reflux disease 04/23/2019   Hematochezia 04/23/2019   Hypercholesterolemia 04/23/2019   Irritable bowel syndrome without diarrhea 04/23/2019   Polyp of colon 04/23/2019   Essential hypertension 02/14/2019   Fibromyalgia 01/16/2019   Primary osteoarthritis involving multiple joints 01/16/2019   DDD (degenerative disc disease), cervical 09/20/2018    Chest pain 02/17/2016   Dyslipidemia 02/17/2016   Family history of heart disease    Dyspnea 01/20/2016   Multiple pulmonary nodules determined by computed tomography of lung 01/20/2016   Unspecified osteoarthritis, unspecified site 11/16/2013   Coronary artery calcification 08/23/2013   Upper airway cough syndrome 08/19/2013   Exertional chest pain 08/19/2013   Restless leg syndrome 11/14/2012   Sleep apnea 10/03/2012   Interstitial lung disease ([place]) 04/12/2011   Wheezing 04/12/2011    Past Medical History:  Diagnosis Date   Asthma    Cataracts, bilateral    Chronic kidney disease    cyst in left kidney   Colon cancer ([place]) 2017   surgical tx  removal of polyp   Concussion 04/20/2021   Coronary artery calcification    a. POET in 2015 with no acute findings.   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   no  current issues with   Pneumonia 2016   Rheumatoid arthritis ([place])    oa also, back arthritis, hx rheumatoid     Family History  Problem Relation Age of Onset   CAD Mother 57       CABG/stents   Breast cancer Mother    Kidney failure Mother    Asthma Father    CAD Father 4   Allergies Father    CAD Brother    CAD Brother 30       9 stents   CAD Brother 52       7 stents   Heart attack Brother    Past Surgical History:  Procedure Laterality Date   ABDOMINAL HYSTERECTOMY     ovaries left   ANKLE SURGERY Left 02/12/2021   COLONOSCOPY WITH PROPOFOL N/A 03/23/2017   Procedure: COLONOSCOPY WITH PROPOFOL;  Surgeon: Arta Silence, MD;  Location: [facility];  Service: Endoscopy;  Laterality: N/A;   colonscopy  2017   DILATION AND CURETTAGE OF UTERUS     few done for endometriosis    GASTROC RECESSION EXTREMITY Left 02/12/2021   Procedure: Left gastroc recession;  Surgeon: Wylene Simmer, MD;  Location: [place];  Service: Orthopedics;  Laterality: Left;   ROTATOR CUFF REPAIR Left 09/2021   athroscopic and open   Social History   Social History Narrative   Lives alone.     Immunization History  Administered Date(s) Administered   Influenza,inj,Quad PF,6+ Mos 05/20/2021   Moderna Sars-Covid-2 Vaccination 10/02/2019, 10/30/2019, 05/05/2020, 10/13/2020     Objective: Vital Signs: BP 136/73 (BP Location: Right Arm, Patient Position: Sitting, Cuff Size: Normal)   Pulse 78   Resp 16   Ht '4\' 11"'$  (1.499 m)   Wt 185 lb 12.8 oz (84.3 kg)   BMI 37.53 kg/m    Physical Exam Constitutional:      Appearance: She is obese.  Cardiovascular:     Rate and Rhythm: Normal rate and regular rhythm.  Pulmonary:     Effort: Pulmonary effort is normal.     Breath sounds: Normal breath sounds.  Musculoskeletal:     Comments: Bilateral pedal edema right worse than left, erythematous changes on medial left ankle extending partway to knee  Skin:    General: Skin is warm and dry.     Capillary Refill: Capillary refill takes less than 2 seconds.  Neurological:     Mental Status: She is alert.  Psychiatric:        Mood and Affect: Mood normal.      Musculoskeletal Exam:  Elbows full ROM no tenderness or swelling Wrists full ROM no tenderness or swelling Fingers prominent heberdon's nodes of DIPs b/l with some lateral deviations and flexion Left knee with anterior bony nodules present, decreased flexion ROM tolerated, no palpable effusion Ankles full ROM no tenderness, overlying swelling without any definite joint effusion  Investigation: No additional findings.  Imaging: No results found.  Recent Labs: Lab Results  Component Value Date   WBC 9.0 05/25/2021   HGB 12.9 05/25/2021   PLT 175 05/25/2021   NA 143 05/25/2021   K 3.7 05/25/2021   CL 110  05/25/2021   CO2 22 05/25/2021   GLUCOSE 86 05/25/2021   BUN 16 05/25/2021   CREATININE 0.66 05/25/2021   BILITOT 0.6 03/25/2021   ALKPHOS 121 03/25/2021   AST 28 03/25/2021   ALT 21 03/25/2021   PROT 7.3 03/25/2021   ALBUMIN 4.2 03/25/2021   CALCIUM 9.1 05/25/2021  GFRAA 99 08/18/2020    Speciality Comments: No specialty comments available.  Procedures:  No procedures performed Allergies: Amlodipine, Bee venom, Lisinopril, Other, Aspirin, Cefdinir, Erythromycin, Hctz [hydrochlorothiazide], and Penicillins   Assessment / Plan:     Visit Diagnoses: Erosive osteoarthritis of both hands  Does not appear to have significant flare up of hand arthritis symptoms. Does have left shoulder pain but also not in as much exacerbation. I do not see any effusion of the knee and while exam is limited ankle also appears at baseline. Will recheck inflammatory serology today for assessment.  Peripheral venous insufficiency  Pedal edema b/l worse on left side currently, appears atypical for clot with distribution and has been ongoing for weeks. Could consider new venous ultrasound study to reassess, discussed trying compression, elevation, may trial course of oral diuretic as well.  Pain in left ankle and joints of left foot - Plan: Sedimentation rate, C-reactive protein, CBC with Differential/Platelet, COMPLETE METABOLIC PANEL WITH GFR  Not clear what trigger of current symptoms. Nerve involvement maybe impingement from swelling or changes at knee but does not see acutely swollen at the actual joint. Checking inflammation markers also metabolic panel for any evidence of changes also for medication monitoring if needing trial of steroids or diuretics.  Orders: Orders Placed This Encounter  Procedures   Sedimentation rate   C-reactive protein   CBC with Differential/Platelet   COMPLETE METABOLIC PANEL WITH GFR   No orders of the defined types were placed in this encounter.    Follow-Up  Instructions: Return if symptoms worsen or fail to improve.   Collier Salina, MD  Note - This record has been created using [employer].  Chart creation errors have been sought, but may not always  have been located. Such creation errors do not reflect on  the standard of medical care.

## 2022-04-15 ENCOUNTER — Encounter: Payer: Self-pay | Admitting: Internal Medicine

## 2022-04-15 ENCOUNTER — Ambulatory Visit: Payer: Medicare Other | Attending: Internal Medicine | Admitting: Internal Medicine

## 2022-04-15 VITALS — BP 136/73 | HR 78 | Resp 16 | Ht 59.0 in | Wt 185.8 lb

## 2022-04-15 DIAGNOSIS — M154 Erosive (osteo)arthritis: Secondary | ICD-10-CM | POA: Diagnosis present

## 2022-04-15 DIAGNOSIS — I872 Venous insufficiency (chronic) (peripheral): Secondary | ICD-10-CM | POA: Diagnosis present

## 2022-04-15 DIAGNOSIS — Z8739 Personal history of other diseases of the musculoskeletal system and connective tissue: Secondary | ICD-10-CM | POA: Diagnosis present

## 2022-04-15 DIAGNOSIS — M25572 Pain in left ankle and joints of left foot: Secondary | ICD-10-CM | POA: Diagnosis present

## 2022-04-15 DIAGNOSIS — M6702 Short Achilles tendon (acquired), left ankle: Secondary | ICD-10-CM

## 2022-04-16 LAB — CBC WITH DIFFERENTIAL/PLATELET
Absolute Monocytes: 502 cells/uL (ref 200–950)
Basophils Absolute: 18 cells/uL (ref 0–200)
Basophils Relative: 0.2 %
Eosinophils Absolute: 62 cells/uL (ref 15–500)
Eosinophils Relative: 0.7 %
HCT: 36.9 % (ref 35.0–45.0)
Hemoglobin: 12.1 g/dL (ref 11.7–15.5)
Lymphs Abs: 1690 cells/uL (ref 850–3900)
MCH: 26.9 pg — ABNORMAL LOW (ref 27.0–33.0)
MCHC: 32.8 g/dL (ref 32.0–36.0)
MCV: 82 fL (ref 80.0–100.0)
MPV: 11.9 fL (ref 7.5–12.5)
Monocytes Relative: 5.7 %
Neutro Abs: 6530 cells/uL (ref 1500–7800)
Neutrophils Relative %: 74.2 %
Platelets: 208 10*3/uL (ref 140–400)
RBC: 4.5 10*6/uL (ref 3.80–5.10)
RDW: 13.5 % (ref 11.0–15.0)
Total Lymphocyte: 19.2 %
WBC: 8.8 10*3/uL (ref 3.8–10.8)

## 2022-04-16 LAB — SEDIMENTATION RATE: Sed Rate: 104 mm/h — ABNORMAL HIGH (ref 0–30)

## 2022-04-16 LAB — COMPLETE METABOLIC PANEL WITH GFR
AG Ratio: 1.1 (calc) (ref 1.0–2.5)
ALT: 21 U/L (ref 6–29)
AST: 21 U/L (ref 10–35)
Albumin: 3.8 g/dL (ref 3.6–5.1)
Alkaline phosphatase (APISO): 112 U/L (ref 37–153)
BUN: 17 mg/dL (ref 7–25)
CO2: 23 mmol/L (ref 20–32)
Calcium: 9.2 mg/dL (ref 8.6–10.4)
Chloride: 107 mmol/L (ref 98–110)
Creat: 0.72 mg/dL (ref 0.50–1.05)
Globulin: 3.6 g/dL (calc) (ref 1.9–3.7)
Glucose, Bld: 89 mg/dL (ref 65–99)
Potassium: 3.6 mmol/L (ref 3.5–5.3)
Sodium: 143 mmol/L (ref 135–146)
Total Bilirubin: 0.5 mg/dL (ref 0.2–1.2)
Total Protein: 7.4 g/dL (ref 6.1–8.1)
eGFR: 92 mL/min/{1.73_m2} (ref >=60)

## 2022-04-16 LAB — C-REACTIVE PROTEIN: CRP: 11.3 mg/L — ABNORMAL HIGH (ref <8.0)

## 2022-04-20 ENCOUNTER — Emergency Department (HOSPITAL_BASED_OUTPATIENT_CLINIC_OR_DEPARTMENT_OTHER): Payer: Medicare Other

## 2022-04-20 ENCOUNTER — Emergency Department (HOSPITAL_COMMUNITY): Payer: Medicare Other

## 2022-04-20 ENCOUNTER — Other Ambulatory Visit: Payer: Self-pay

## 2022-04-20 ENCOUNTER — Encounter (HOSPITAL_COMMUNITY): Payer: Self-pay | Admitting: *Deleted

## 2022-04-20 ENCOUNTER — Emergency Department (HOSPITAL_COMMUNITY)
Admission: EM | Admit: 2022-04-20 | Discharge: 2022-04-20 | Disposition: A | Discharge status: Home / Self Care | Payer: Medicare Other | Attending: Emergency Medicine | Admitting: Emergency Medicine

## 2022-04-20 DIAGNOSIS — R609 Edema, unspecified: Secondary | ICD-10-CM | POA: Insufficient documentation

## 2022-04-20 DIAGNOSIS — R0602 Shortness of breath: Secondary | ICD-10-CM | POA: Diagnosis not present

## 2022-04-20 DIAGNOSIS — R140 Abdominal distension (gaseous): Secondary | ICD-10-CM | POA: Insufficient documentation

## 2022-04-20 DIAGNOSIS — R2243 Localized swelling, mass and lump, lower limb, bilateral: Secondary | ICD-10-CM | POA: Insufficient documentation

## 2022-04-20 DIAGNOSIS — R635 Abnormal weight gain: Secondary | ICD-10-CM | POA: Insufficient documentation

## 2022-04-20 DIAGNOSIS — M7989 Other specified soft tissue disorders: Secondary | ICD-10-CM

## 2022-04-20 LAB — COMPREHENSIVE METABOLIC PANEL
ALT: 28 U/L (ref 0–44)
AST: 29 U/L (ref 15–41)
Albumin: 3.6 g/dL (ref 3.5–5.0)
Alkaline Phosphatase: 99 U/L (ref 38–126)
Anion gap: 7 (ref 5–15)
BUN: 18 mg/dL (ref 8–23)
CO2: 27 mmol/L (ref 22–32)
Calcium: 9.2 mg/dL (ref 8.9–10.3)
Chloride: 108 mmol/L (ref 98–111)
Creatinine, Ser: 0.72 mg/dL (ref 0.44–1.00)
GFR, Estimated: >60 mL/min (ref >60)
Glucose, Bld: 106 mg/dL — ABNORMAL HIGH (ref 70–99)
Potassium: 3.7 mmol/L (ref 3.5–5.1)
Sodium: 142 mmol/L (ref 135–145)
Total Bilirubin: 0.7 mg/dL (ref 0.3–1.2)
Total Protein: 8.2 g/dL — ABNORMAL HIGH (ref 6.5–8.1)

## 2022-04-20 LAB — CBC WITH DIFFERENTIAL/PLATELET
Abs Immature Granulocytes: 0.02 10*3/uL (ref 0.00–0.07)
Basophils Absolute: 0 10*3/uL (ref 0.0–0.1)
Basophils Relative: 0 %
Eosinophils Absolute: 0.1 10*3/uL (ref 0.0–0.5)
Eosinophils Relative: 1 %
HCT: 40.1 % (ref 36.0–46.0)
Hemoglobin: 12.4 g/dL (ref 12.0–15.0)
Immature Granulocytes: 0 %
Lymphocytes Relative: 20 %
Lymphs Abs: 1.6 10*3/uL (ref 0.7–4.0)
MCH: 26.6 pg (ref 26.0–34.0)
MCHC: 30.9 g/dL (ref 30.0–36.0)
MCV: 85.9 fL (ref 80.0–100.0)
Monocytes Absolute: 0.4 10*3/uL (ref 0.1–1.0)
Monocytes Relative: 5 %
Neutro Abs: 6 10*3/uL (ref 1.7–7.7)
Neutrophils Relative %: 74 %
Platelets: 209 10*3/uL (ref 150–400)
RBC: 4.67 MIL/uL (ref 3.87–5.11)
RDW: 13.7 % (ref 11.5–15.5)
WBC: 8.1 10*3/uL (ref 4.0–10.5)
nRBC: 0 % (ref 0.0–0.2)

## 2022-04-20 LAB — BRAIN NATRIURETIC PEPTIDE: B Natriuretic Peptide: 70.3 pg/mL (ref 0.0–100.0)

## 2022-04-20 LAB — TROPONIN I (HIGH SENSITIVITY): Troponin I (High Sensitivity): 3 ng/L (ref <18)

## 2022-04-20 NOTE — Progress Notes (Signed)
Left lower extremity venous duplex has been completed. Preliminary results can be found in CV Proc through chart review.  Results were given to Dr. Regenia Skeeter.  04/20/22 8:29 AM Kendra Gilbert RVT

## 2022-04-20 NOTE — Discharge Instructions (Addendum)
If you develop new or worsening chest pain, shortness of breath, swelling, or any other new/concerning symptoms then return to the ER for evaluation or call 911.  Otherwise, elevate your legs, wear your compression stockings, and follow-up with your primary care physician.

## 2022-04-20 NOTE — ED Triage Notes (Signed)
Reports for a month shob and edema in legs for a months, states her left leg is red.

## 2022-04-20 NOTE — ED Provider Notes (Signed)
[facility] DEPT Provider Note   CSN: [id number] Arrival date & time: 04/20/22  8299     History  Chief Complaint  Patient presents with   Leg Swelling   Shortness of Breath    Kendra Gilbert is a 66 y.o. female.  HPI 66 year old female presents with leg swelling, weight gain, shortness of breath.  The symptoms have all been ongoing for a month or more.  She previously broke her ankle and had surgery in the left leg.  Over this time she feels like her left leg is more swollen and inflamed than the right.  She is also been having abdominal bloating.  She feels short of breath but she is not sure how much of that is asthma versus fluid.  She currently weighs about 185 pounds which she states is more than what she typically was weighing which was about 170.  The time course of this weight gain has been several months.  She has been feeling tightness in her chest for the last couple weeks and feels like she cannot walk quite as far as normal.  The tightness is constant throughout the day, not just with exertion.  She also has a cough which is chronic from asthma but has a little more sputum over the last few days.  Home Medications Prior to Admission medications   Medication Sig Start Date End Date Taking? Authorizing Provider  acetaminophen (TYLENOL) 500 MG tablet Take 325 mg by mouth daily as needed for mild pain, fever or headache.    [provider]  Budeson-Glycopyrrol-Formoterol (BREZTRI AEROSPHERE) 160-9-4.8 MCG/ACT AERO Inhale two puffs twice daily to prevent cough or wheeze. Rinse mouth after use. 04/02/22   Padgett, Rae Halsted, MD  diphenhydrAMINE (BENADRYL) 25 MG tablet Take 25 mg by mouth every 6 (six) hours as needed.    [provider]  EPINEPHrine 0.3 mg/0.3 mL IJ SOAJ injection Inject 0.3 mg into the muscle as needed for anaphylaxis. 05/06/21   Kennith Gain, MD  fluticasone (FLOVENT HFA) 220 MCG/ACT inhaler  Inhale 2 puffs into the lungs 2 (two) times daily. 02/05/22   Kennith Gain, MD  ipratropium-albuterol (DUONEB) 0.5-2.5 (3) MG/3ML SOLN Use 1 vial via nebulizer every 4-6 hours as needed for cough, wheeze, shortness of breath or chest tightness 11/13/21   Kennith Gain, MD  levocetirizine (XYZAL) 5 MG tablet Take 1 tablet (5 mg total) by mouth every evening. 03/25/22   Padgett, Rae Halsted, MD  losartan (COZAAR) 25 MG tablet TAKE 1 TABLET(25 MG) BY MOUTH IN THE MORNING AND AT BEDTIME 10/28/21   Minus Breeding, MD  pantoprazole (PROTONIX) 20 MG tablet Take 20 mg by mouth daily. 10/27/21   [provider]  pravastatin (PRAVACHOL) 80 MG tablet Take 1 tablet (80 mg total) by mouth daily. 01/04/22   Minus Breeding, MD  prednisoLONE acetate (PRED FORTE) 1 % ophthalmic suspension SMARTSIG:In Eye(s) Patient not taking: Reported on 04/15/2022 03/17/22   [provider]  RESTASIS 0.05 % ophthalmic emulsion 1 drop 2 (two) times daily. 03/22/22   [provider]  Sodium Chloride-Sodium Bicarb 1.57 g PACK Place into the nose.    [provider]  SYMBICORT 160-4.5 MCG/ACT inhaler INHALE 2 PUFFS INTO THE LUNGS TWICE DAILY Patient not taking: Reported on 04/15/2022 11/27/21   Kennith Gain, MD  VENTOLIN HFA 108 [phone] Base) MCG/ACT inhaler Inhale 2 puffs into the lungs every 4 (four) hours as needed for wheezing or shortness of breath. 01/14/22  Kennith Gain, MD      Allergies    Amlodipine, Bee venom, Lisinopril, Other, Aspirin, Cefdinir, Erythromycin, Hctz [hydrochlorothiazide], and Penicillins    Review of Systems   Review of Systems  Respiratory:  Positive for cough, chest tightness and shortness of breath.   Cardiovascular:  Positive for leg swelling.    Physical Exam Updated Vital Signs BP (!) 156/71   Pulse 68   Temp 97.7 F (36.5 C) (Oral)   Resp 19   Ht '4\' 11"'$  (1.499 m)   Wt 83.9 kg   SpO2 98%   BMI 37.37 kg/m   Physical Exam Vitals and nursing note reviewed.  Constitutional:      General: She is not in acute distress.    Appearance: She is well-developed. She is obese. She is not ill-appearing or diaphoretic.  HENT:     Head: Normocephalic and atraumatic.  Cardiovascular:     Rate and Rhythm: Normal rate and regular rhythm.     Pulses:          Dorsalis pedis pulses are 2+ on the right side and 2+ on the left side.     Heart sounds: Normal heart sounds.  Pulmonary:     Effort: Pulmonary effort is normal.     Breath sounds: Normal breath sounds. No wheezing.  Abdominal:     Palpations: Abdomen is soft.     Tenderness: There is no abdominal tenderness.  Musculoskeletal:     Comments: Both lower legs/ankles/feet appear mildly swollen.  Minimal to no pitting edema.  The left leg is slightly bigger than the right although no overt cellulitis.  Skin:    General: Skin is warm and dry.  Neurological:     Mental Status: She is alert.     ED Results / Procedures / Treatments   Labs (all labs ordered are listed, but only abnormal results are displayed) Labs Reviewed  COMPREHENSIVE METABOLIC PANEL - Abnormal; Notable for the following components:      Result Value   Glucose, Bld 106 (*)    Total Protein 8.2 (*)    All other components within normal limits  BRAIN NATRIURETIC PEPTIDE  CBC WITH DIFFERENTIAL/PLATELET  TROPONIN I (HIGH SENSITIVITY)    EKG EKG Interpretation  Date/Time:  Tuesday April 20 2022 07:31:42 EDT Ventricular Rate:  82 PR Interval:  132 QRS Duration: 90 QT Interval:  378 QTC Calculation: 442 R Axis:   38 Text Interpretation: Sinus rhythm Abnormal R-wave progression, early transition no significant change since June 2023 Confirmed by Sherwood Gambler [phone]) on 04/20/2022 7:36:28 AM  Radiology DG Chest 2 View  Result Date: 04/20/2022 CLINICAL DATA:  Shortness of breath, edema in lower extremities EXAM: CHEST - 2 VIEW COMPARISON:  05/08/2019 FINDINGS: Cardiac  size is within normal limits. There is poor inspiration. There are no signs of pulmonary edema or focal pulmonary consolidation. There is no pleural effusion or pneumothorax. IMPRESSION: No active cardiopulmonary disease. Electronically Signed   By: Elmer Picker M.D.   On: 04/20/2022 09:56   VAS [place] LOWER EXTREMITY VENOUS (DVT) (ONLY [facility] & [facility])  Result Date: 04/20/2022  Lower Venous DVT Study Patient Name:  VERONIQUE Lees  Date of Exam:   04/20/2022 Medical Rec #: [id number]          Accession #:    [id number] Date of Birth: February 08, 1956          Patient Gender: F Patient Age:   30 years Exam Location:  [facility]  Procedure:      VAS [place] LOWER EXTREMITY VENOUS (DVT) Referring Phys: Nicki Reaper Lizzet Hendley --------------------------------------------------------------------------------  Indications: Swelling.  Risk Factors: None identified. Limitations: Poor ultrasound/tissue interface. Comparison Study: No prior studies. Performing Technologist: Oliver Hum RVT  Examination Guidelines: A complete evaluation includes B-mode imaging, spectral Doppler, color Doppler, and power Doppler as needed of all accessible portions of each vessel. Bilateral testing is considered an integral part of a complete examination. Limited examinations for reoccurring indications may be performed as noted. The reflux portion of the exam is performed with the patient in reverse Trendelenburg.  +-----+---------------+---------+-----------+----------+--------------+ RIGHTCompressibilityPhasicitySpontaneityPropertiesThrombus Aging +-----+---------------+---------+-----------+----------+--------------+ CFV  Full           Yes      Yes                                 +-----+---------------+---------+-----------+----------+--------------+   +---------+---------------+---------+-----------+----------+--------------+ LEFT     CompressibilityPhasicitySpontaneityPropertiesThrombus Aging  +---------+---------------+---------+-----------+----------+--------------+ CFV      Full           Yes      Yes                                 +---------+---------------+---------+-----------+----------+--------------+ SFJ      Full                                                        +---------+---------------+---------+-----------+----------+--------------+ FV Prox  Full                                                        +---------+---------------+---------+-----------+----------+--------------+ FV Mid   Full                                                        +---------+---------------+---------+-----------+----------+--------------+ FV DistalFull                                                        +---------+---------------+---------+-----------+----------+--------------+ PFV      Full                                                        +---------+---------------+---------+-----------+----------+--------------+ POP      Full           Yes      Yes                                 +---------+---------------+---------+-----------+----------+--------------+ PTV      Full                                                        +---------+---------------+---------+-----------+----------+--------------+  PERO     Full                                                        +---------+---------------+---------+-----------+----------+--------------+    Summary: RIGHT: - No evidence of common femoral vein obstruction.  LEFT: - There is no evidence of deep vein thrombosis in the lower extremity.  - No cystic structure found in the popliteal fossa.  *See table(s) above for measurements and observations.    Preliminary     Procedures Procedures    Medications Ordered in ED Medications - No data to display  ED Course/ Medical Decision Making/ A&P                           Medical Decision Making Amount and/or Complexity of Data  Reviewed External Data Reviewed: notes. Labs: ordered.    Details: Normal WBC and hemoglobin.  Troponin, BNP and CMP are all unremarkable. Radiology: ordered and independent interpretation performed.    Details: Chest x-ray shows no CHF ECG/medicine tests: ordered and independent interpretation performed.    Details: No acute ischemia   Patient presents with at least 1 month of weight gain and peripheral edema.  Minimal edema noted and her abdominal exam is benign and pulmonary exam shows no obvious rales.  Work-up is pretty unremarkable here as above.  She has compression stockings though is not currently wearing them.  I have recommended she wear them and follow-up with her PCP for further outpatient work-up.  I doubt this is acute CHF.  She has had a couple weeks of chest tightness but I doubt ACS.  Doubt PE.  DVT ultrasound negative for the left lower extremity which is slightly more swollen than the right, probably a chronic finding due to her previous ankle repair.  I doubt acute arterial or venous emergency.  At this point, she appears stable for discharge home with return precautions.        Final Clinical Impression(s) / ED Diagnoses Final diagnoses:  Peripheral edema    Rx / DC Orders ED Discharge Orders     None         Sherwood Gambler, MD 04/20/22 1014

## 2022-04-22 NOTE — Progress Notes (Unsigned)
Cardiology Office Note:    Date:  04/22/2022   ID:  Kendra Gilbert, DOB 10-31-1955, MRN [id number]  PCP:  Marda Stalker, PA-C  Cardiologist:  Minus Breeding, MD  Electrophysiologist:  None   Referring MD: Marda Stalker, PA-C   Chief Complaint: ED follow-up of shortness of breath, edema, and chest tightness  History of Present Illness:    Kendra Gilbert is a 66 y.o. female with a history of non-obstructive CAD on coronary CTA in 09/2018, hypertension, hyperlipidemia, GERD, and rheumatic arthritis who is followed by Dr. Percival Spanish and presents today for ED follow-up of shortness of breath, edema and chest tightness.  Coronary CTA in 09/2018 showed coronary calcium score of 259 (94th percentile for age and sex) and moderate CAD in the proximal and mid LAD. FFR showed no significant disease. ABIs in 01/2020 were normal. Myoview in 08/2020 for further evaluation of shortness of breath was low risk with no evidence of ischemia.   Patient was last seen by Dr. Percival Spanish in 12/2021 at which time she reported occasional left sided sharp chest pain that is sometimes with exertion but overall sporadic. This is a longstanding issue and seemed stable so no additional work-up was felt to be necessary. Shortly after this visit, she was seen in the ED for further evaluation of similar chest pain and cough. Pain was worse with movement, inspiration, and coughing. EKG showed no no acute changes. She was noted to have wheezing on exam. She was felt to have an asthma exacerbation and was treated with low dose Prednisone and Doxycycline in additional to home inhalers.   She was recently seen in the ED on 04/20/2022 for further evaluation of shortness of breath, lower extremity edema, and weight gain. She also reported some chest tightness for the last couple of weeks. Work-up was unremarkable.  EKG showed no acute ischemic changes. High-sensitivity troponin was negative. BNP was normal. Chest x-ray showed no  acute findings. Doppler of left leg was negative for DVT. She was not felt to significant volume overload on exam and was not felt to have acute CHF or ACS. She was felt to be stable for discharge and was advised to follow-up with her PCP.  Patient presents today for follow-up. ***  Chest Pain Non-Obstructive CAD Patient has a long history of atypical chest pain. Coronary CTA in 09/2018 showed coronary calcium score of 259 (94th percentile for age and sex) and moderate CAD in the proximal and mid LAD. FFR showed no significant disease. Myoview in 08/2020 was low risk with no evidence of ischemia. She was recently seen in the ED with multiple complaints including some chest tightness. EKG showed no acute findings and high-sensitivity troponin was negative.  - *** - Unable to tolerate Aspirin due to GI issues. - Continue statin.  Shortness of Breath ***. BNP during recent ED visit was normal and chest x-ray at that time showed no acute findings.  - ***  Hypertension BP well controlled. *** - Continue Losartan '25mg'$  twice daily.  Hyperlipidemia Most recent lipid panel in 03/2021: Total Cholesterol 166, Triglycerides 105, HDL 67, LDL 80. LDL goal <70 given CAD. - Current on Pravastatin '80mg'$  daily. She has been unable to tolerate other statins per chart review. - Will repeat lipid panel and LFTs. If LDL still above goal, would recommend adding Zetia '10mg'$  daily. ***  Past Medical History:  Diagnosis Date   Asthma    Cataracts, bilateral    Chronic kidney disease    cyst in left  kidney   Colon cancer [facility]) 2017   surgical tx  removal of polyp   Concussion 04/20/2021   Coronary artery calcification    a. POET in 2015 with no acute findings.   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   no current issues with   Pneumonia 2016   Rheumatoid arthritis ([place])    oa also, back arthritis, hx rheumatoid     Past Surgical History:  Procedure Laterality Date   ABDOMINAL HYSTERECTOMY     ovaries left   ANKLE SURGERY Left 02/12/2021   COLONOSCOPY WITH PROPOFOL N/A 03/23/2017   Procedure: COLONOSCOPY WITH PROPOFOL;  Surgeon: Arta Silence, MD;  Location: [facility];  Service: Endoscopy;  Laterality: N/A;   colonscopy  2017   DILATION AND CURETTAGE OF UTERUS     few done for endometriosis   GASTROC RECESSION EXTREMITY Left 02/12/2021   Procedure: Left gastroc recession;  Surgeon: Wylene Simmer, MD;  Location: [place];  Service: Orthopedics;  Laterality: Left;   ROTATOR CUFF REPAIR Left 09/2021   athroscopic and open    Current Medications: No outpatient medications have been marked as taking for the 04/28/22 encounter (Appointment) with Darreld Mclean, PA-C.   Current Facility-Administered Medications for the 04/28/22 encounter (Appointment) with Darreld Mclean, PA-C  Medication   omalizumab Arvid Right) injection 375 mg     Allergies:   Amlodipine, Bee venom, Lisinopril, Other, Aspirin, Cefdinir, Erythromycin, Hctz [hydrochlorothiazide], and Penicillins   Social History   Socioeconomic History   Marital status: Single    Spouse name: Not on file   Number of children: Not on file   Years of education: Not on file   Highest education level: Not on file  Occupational History   Not on file  Tobacco Use   Smoking status: Never    Passive exposure: Never   Smokeless tobacco: Never  Vaping Use   Vaping Use: Never used  Substance and Sexual Activity   Alcohol use: No   Drug use: Never   Sexual activity: Not on file  Other Topics Concern   Not on file  Social History Narrative   Lives alone.     Social Determinants of Health   Financial Resource Strain: Not on file   Food Insecurity: Not on file  Transportation Needs: Not on file  Physical Activity: Not on file  Stress: Not on file  Social Connections: Not on file     Family History: The patient's family history includes Allergies in her father; Asthma in her father; Breast cancer in her mother; CAD in her brother; CAD (age of onset: 37) in her brother, father, and mother; CAD (age of onset: 57) in her brother; Heart attack in her brother; Kidney failure in her mother.  ROS:   Please see the history of present illness.    EKGs/Labs/Other Studies Reviewed:    The following studies were reviewed:  Coronary CTA 09/18/2018: Impressions: 1. Coronary calcium score of 259. This was 102 percentile for age and sex matched control. 2. Normal coronary origin with left dominance. 3. Moderate CAD in the proximal and mid  LAD. Additional evaluation with CT FFR will be submitted. Aggressive risk factor modification is recommended.  FFR Impression: CT FFR analysis didn't show any significant stenosis. _______________  Brantley Fling 08/26/2020: The left ventricular ejection fraction is hyperdynamic (>65%). Nuclear stress EF: 66%. No T wave inversion was noted during stress. There was no ST segment deviation noted during stress. The study is normal. This is a low risk study.   Normal perfusion. LVEF 66% with normal wall motion. This is a low risk study. No change compared to prior study in 2017.   EKG:  EKG not ordered today.  Recent Labs: 04/20/2022: ALT 28; B Natriuretic Peptide 70.3; BUN 18; Creatinine, Ser 0.72; Hemoglobin 12.4; Platelets 209; Potassium 3.7; Sodium 142  Recent Lipid Panel    Component Value Date/Time   CHOL 166 03/25/2021 0901   TRIG 105 03/25/2021 0901   HDL 67 03/25/2021 0901   CHOLHDL 2.5 03/25/2021 0901   CHOLHDL 3.3 02/06/2016 0930   VLDL 19 02/06/2016 0930   LDLCALC 80 03/25/2021 0901   LDLDIRECT 135.2 08/23/2013 1142    Physical Exam:    Vital Signs: There were no  vitals taken for this visit.    Wt Readings from Last 3 Encounters:  04/20/22 185 lb (83.9 kg)  04/15/22 185 lb 12.8 oz (84.3 kg)  03/25/22 186 lb 3.2 oz (84.5 kg)     General: 66 y.o. female in no acute distress. HEENT: Normocephalic and atraumatic. Sclera clear. EOMs intact. Neck: Supple. No carotid bruits. No JVD. Heart: *** RRR. Distinct S1 and S2. No murmurs, gallops, or rubs. Radial and distal pedal pulses 2+ and equal bilaterally. Lungs: No increased work of breathing. Clear to ausculation bilaterally. No wheezes, rhonchi, or rales.  Abdomen: Soft, non-distended, and non-tender to palpation. Bowel sounds present in all 4 quadrants.  MSK: Normal strength and tone for age. *** Extremities: No lower extremity edema.    Skin: Warm and dry. Neuro: Alert and oriented x3. No focal deficits. Psych: Normal affect. Responds appropriately.   Assessment:    No diagnosis found.  Plan:     Disposition: Follow up in ***   Medication Adjustments/Labs and Tests Ordered: Current medicines are reviewed at length with the patient today.  Concerns regarding medicines are outlined above.  No orders of the defined types were placed in this encounter.  No orders of the defined types were placed in this encounter.   There are no Patient Instructions on file for this visit.   Signed, Darreld Mclean, PA-C  04/22/2022 5:55 PM    [facility]

## 2022-04-23 ENCOUNTER — Ambulatory Visit
Admission: RE | Admit: 2022-04-23 | Discharge: 2022-04-23 | Disposition: A | Discharge status: Home / Self Care | Payer: Medicare Other | Source: Ambulatory Visit | Attending: Family Medicine | Admitting: Family Medicine

## 2022-04-23 DIAGNOSIS — Z1231 Encounter for screening mammogram for malignant neoplasm of breast: Secondary | ICD-10-CM

## 2022-04-27 NOTE — Progress Notes (Unsigned)
Cardiology Office Note:    Date:  04/28/2022   ID:  Kendra Gilbert, DOB 02-03-1956, MRN [id number]  PCP:  Marda Stalker, [place] Prov[facility] Cardiologist:  Minus Breeding, MD     Referring MD: Marda Stalker, PA-C   Chief Complaint  Patient presents with   Follow-up   Leg Swelling    bilateral   Edema    Bilateral ankles    History of Present Illness:    Kendra Gilbert is a 66 y.o. female with a hx of the following:  Coronary artery calcification HLD HTN PVI ILD OSA Pulmonary nodules RA, OA (follows rheumatology)   In 2020 she had a coronary CTA that demonstrated left main calcification, LAD had proximal 50 to 69% stenosis.  Lexiscan was negative in February 2022.  Last seen by Dr. Percival Spanish in June 2023.  She presented for follow-up of hypertension, was previously intolerant of lisinopril which caused a cough, therefore she was switched to Cozaar.  She noted some occasional left sharp sided chest pain, seem to be longstanding, seem to be associated with exertion, but sometimes sporadic, lasting for only a few minutes.  She denied any other associated symptoms.  No changes in quality or frequency or intensity.  If this occurred, she stated she would let the office know.  Previous Dopplers for leg swelling did not reveal DVT.  Was recommended to follow-up in 12 months or sooner if needed.  Presented to the [facility] on April 20, 2022 with chief complaint of shortness of breath for 1 month, edema in bilateral legs for several months, and stated her left leg was red.  She had previously broken her ankle and had surgery in the left leg, over time she felt that her left leg was more swollen and inflamed than the right.  Weight was 185, typical weight was around 170.  Chest tightness for last couple weeks, feels like she could not walk as far as she could, tightness was throughout the day, not just with exertion.  Has chronic cough from asthma, but noticed  a little more sputum over the last few days.  EKG revealed sinus rhythm, 82 bpm, abnormal R wave progression, early transition, no significant change since EKG in June 2023. CXR showed nothing acute.  No DVT found bilaterally via venous Doppler.  Labs were all unremarkable.  BNP normal.  Troponin normal.  She was recommended to wear compression stockings, as she was not wearing them.  Was stable for discharge later that day.  Today she presents for hospital follow-up. She states her swelling in her bilateral lower extremities is the same.  She has not been wearing her compression stockings as advised, she says she is not able to tolerate silk stockings and tolerates only cotton stockings.  Breathing is still the same, having DOE, attributes this partly to her asthma.  She does see asthma specialist regularly.  Saw her PCP last Thursday who evaluated her and recommended she follow-up with cardiology to rule out any cardiac causes.  Says the swelling is chronic for her, she had it about 1 year ago, and it had alternated between right and left side.  She says now its more pronounced on the left side, thinks it might be related to past surgery and history of arthritis, also sometimes notices "reddish" color change along left lower extremity.  She describes central, substernal chest pain with exertion, described as "deep" and feels like she is "being stomped on," similar to presentation during most  recent hospitalization.  Says that lasts longer than a few minutes.  When she coughs she feels it in her left shoulder.  Laying down makes the pain better.  Exertion makes it worse.  She also notices this when inhaling cool air and with the weather changing.  Follows urologist for history of cystic lesion on kidney, was evaluated in office a few months ago.  Denies any palpitations, syncope, presyncope, dizziness, bleeding, orthopnea, PND, or claudication.  She will have a follow-up MRI next year for evaluation. Denies any  other questions or concerns.   Past Medical History:  Diagnosis Date   Asthma    Cataracts, bilateral    Chronic kidney disease    cyst in left kidney   Colon cancer [facility]) 2017   surgical tx  removal of polyp   Concussion 04/20/2021   Coronary artery calcification    a. POET in 2015 with no acute findings.   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   no current issues with   Pneumonia 2016   Rheumatoid arthritis ([place])    oa also, back arthritis, hx rheumatoid     Past Surgical History:  Procedure Laterality Date   ABDOMINAL HYSTERECTOMY     ovaries left   ANKLE SURGERY Left 02/12/2021   COLONOSCOPY WITH PROPOFOL N/A 03/23/2017   Procedure: COLONOSCOPY WITH PROPOFOL;  Surgeon: Arta Silence, MD;  Location: [facility];  Service: Endoscopy;  Laterality: N/A;   colonscopy  2017   DILATION AND CURETTAGE OF UTERUS     few done for endometriosis   GASTROC RECESSION EXTREMITY Left 02/12/2021   Procedure: Left gastroc recession;  Surgeon: Wylene Simmer, MD;  Location: [facility];  Service: Orthopedics;  Laterality: Left;   ROTATOR CUFF REPAIR Left 09/2021   athroscopic and open    Current Medications: Current Meds  Medication Sig   acetaminophen (TYLENOL) 500 MG tablet Take 325 mg by mouth daily as needed for mild pain, fever or headache.   Budeson-Glycopyrrol-Formoterol (BREZTRI AEROSPHERE) 160-9-4.8 MCG/ACT AERO Inhale two puffs twice daily to prevent cough or wheeze. Rinse mouth after use.   diphenhydrAMINE (BENADRYL) 25 MG tablet Take 25 mg by mouth every 6 (six) hours as needed.   EPINEPHrine 0.3 mg/0.3 mL IJ SOAJ injection Inject 0.3 mg into the muscle as needed for anaphylaxis.    fluticasone (FLOVENT HFA) 220 MCG/ACT inhaler Inhale 2 puffs into the lungs 2 (two) times daily.   ipratropium-albuterol (DUONEB) 0.5-2.5 (3) MG/3ML SOLN Use 1 vial via nebulizer every 4-6 hours as needed for cough, wheeze, shortness of breath or chest tightness   levocetirizine (XYZAL) 5 MG tablet Take 1 tablet (5 mg total) by mouth every evening.   losartan (COZAAR) 25 MG tablet TAKE 1 TABLET(25 MG) BY MOUTH IN THE MORNING AND AT BEDTIME   pantoprazole (PROTONIX) 20 MG tablet Take 20 mg by mouth daily.   pravastatin (PRAVACHOL) 80 MG tablet Take 1 tablet (80 mg total) by mouth daily.   prednisoLONE acetate (PRED FORTE) 1 % ophthalmic suspension    RESTASIS 0.05 % ophthalmic emulsion 1 drop 2 (two) times daily.  Sodium Chloride-Sodium Bicarb 1.57 g PACK Place into the nose.   SYMBICORT 160-4.5 MCG/ACT inhaler INHALE 2 PUFFS INTO THE LUNGS TWICE DAILY   VENTOLIN HFA 108 (90 Base) MCG/ACT inhaler Inhale 2 puffs into the lungs every 4 (four) hours as needed for wheezing or shortness of breath.    Allergies:   Amlodipine, Bee venom, Lisinopril, Other, Aspirin, Cefdinir, Erythromycin, Hctz [hydrochlorothiazide], and Penicillins   Social History   Socioeconomic History   Marital status: Single    Spouse name: Not on file   Number of children: Not on file   Years of education: Not on file   Highest education level: Not on file  Occupational History   Not on file  Tobacco Use   Smoking status: Never    Passive exposure: Never   Smokeless tobacco: Never  Vaping Use   Vaping Use: Never used  Substance and Sexual Activity   Alcohol use: No   Drug use: Never   Sexual activity: Not on file  Other Topics Concern   Not on file  Social History Narrative   Lives alone.     Social Determinants of Health   Financial Resource Strain: Not on file  Food Insecurity: Not on file  Transportation Needs: Not on file  Physical Activity: Not on file  Stress: Not on file  Social Connections: Not  on file     Family History: The patient's family history includes Allergies in her father; Asthma in her father; Breast cancer in her mother; CAD in her brother; CAD (age of onset: 91) in her brother, father, and mother; CAD (age of onset: 76) in her brother; Heart attack in her brother; Kidney failure in her mother.  ROS:   Review of Systems  Constitutional: Negative.   HENT: Negative.    Eyes: Negative.   Respiratory:  Positive for shortness of breath and wheezing. Negative for cough, hemoptysis and sputum production.        See HPI.  Has history of asthma.  Cardiovascular:  Positive for chest pain and leg swelling. Negative for palpitations, orthopnea, claudication and PND.       See HPI.  Gastrointestinal: Negative.   Genitourinary: Negative.   Musculoskeletal:  Positive for joint pain. Negative for back pain, falls, myalgias and neck pain.  Skin:  Positive for rash.       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  Neurological: Negative.   Endo/Heme/Allergies: Negative.   Psychiatric/Behavioral: Negative.      Please see the history of present illness.    All other systems reviewed and are negative.  EKGs/Labs/Other Studies Reviewed:    The following studies were reviewed today:   EKG:  EKG is ordered today.  She had recent EKG in ED last week that revealed sinus rhythm, 82 bpm, abnormal R wave progression, early transition, no significant change since June 2023.  No acute ischemic changes noted.  Lower extremity doppler on April 20, 2022: Right: No evidence of common femoral vein obstruction.  Left: There is no evidence of DVT in left lower extremity.  No cystic structure found in popliteal fossa.  Nuclear stress test on February 22nd 2022: LVEF 66%.  No T wave inversion was noted on stress.  No ST segment deviation noted during stress.  The study was normal, low risk.  Vascular ultrasound ABI on January 25, 2020: Normal ABI along right and left lower extremity.   Bilateral TBI's normal.   Coronary CTA on September 18, 2018: 1.  Coronary  calcium score 259.  Was in [place] percentile for demographic. 2.  Normal coronary origin with left dominance. 3.  Moderate CAD in proximal mid LAD.  Additional evaluation with CT FFR will be submitted.  Aggressive risk factor modification recommended. 4.  CT FFR analysis did not show any significant stenosis. 5.  Aortic atherosclerosis noted along with hepatic steatosis.  No other acute extracardiac finding noted.  Exercise tolerance test on 11/27/2018: Blood pressure demonstrated normal response to exercise.  Horizontal ST segment depression, ST segment depression 1 mm was noted during stress and V5 and V6 leads.  ETT with fair exercise tolerance.  No chest pain, normal blood pressure response, approximately 1 mm ST depression in inferior lateral leads with peak exertion, study felt to be abnormal, Duke treadmill score 2.  Myoview on February 18, 2016: Blood pressure demonstrated a hypertensive response to exercise. There was no ST segment deviation noted during stress. The study is normal. This is a low risk study. The left ventricular ejection fraction is hyperdynamic (>65%).   Normal pharmacologic nuclear stress test with no evidence for infarct or ischemia.  Recent Labs: 04/20/2022: ALT 28; B Natriuretic Peptide 70.3; BUN 18; Creatinine, Ser 0.72; Hemoglobin 12.4; Platelets 209; Potassium 3.7; Sodium 142  Recent Lipid Panel    Component Value Date/Time   CHOL 166 03/25/2021 0901   TRIG 105 03/25/2021 0901   HDL 67 03/25/2021 0901   CHOLHDL 2.5 03/25/2021 0901   CHOLHDL 3.3 02/06/2016 0930   VLDL 19 02/06/2016 0930   LDLCALC 80 03/25/2021 0901   LDLDIRECT 135.2 08/23/2013 1142     Risk Assessment/Calculations:     The 10-year ASCVD risk score (Arnett DK, et al., 2019) is: 6.8%   Values used to calculate the score:     Age: 55 years     Sex: Female     Is Non-Hispanic African American: No     Diabetic:  No     Tobacco smoker: No     Systolic Blood Pressure: 630 mmHg     Is BP treated: Yes     HDL Cholesterol: 67 mg/dL     Total Cholesterol: 166 mg/dL            Physical Exam:    VS:  BP 126/82 (BP Location: Right Arm, Patient Position: Sitting)   Pulse 93   Wt 186 lb 6.4 oz (84.6 kg)   SpO2 95%   BMI 37.65 kg/m     Wt Readings from Last 3 Encounters:  04/28/22 186 lb 6.4 oz (84.6 kg)  04/20/22 185 lb (83.9 kg)  04/15/22 185 lb 12.8 oz (84.3 kg)     GEN: Obese, 66 y.o. Caucasian female in NAD  HEENT: Butterfly rash with bumps along bilateral face, otherwise normal  NECK: No JVD; No carotid bruits CARDIAC: S1/S2, RRR, no murmurs, rubs, gallops; 2+ peripheral pulses throughout, strong and equal bilaterally RESPIRATORY: Overall clear and diminished to auscultation without rales or rhonchi; diminished, expiratory wheezing along right lung fields.  MUSCULOSKELETAL: Nonpitting edema along BLE, slightly more pronounced on left lower extremity than right lower extremity, no significant color changes or erythema noted, no pain on exam; No deformity  SKIN: Warm and dry NEUROLOGIC:  Alert and oriented x 3 PSYCHIATRIC:  Normal affect   ASSESSMENT:    1. Substernal chest pain   2. Precordial pain   3. Coronary artery disease involving native heart with angina pectoris, unspecified vessel or lesion type ([place])   4. Hyperlipidemia, unspecified hyperlipidemia type  5. Leg swelling   6. Peripheral venous insufficiency   7. Hypertension, unspecified type   8. Asthma, unspecified asthma severity, unspecified whether complicated, unspecified whether persistent   9. Dyspnea on exertion   10. Class 2 obesity with body mass index (BMI) of 37.0 to 37.9 in adult, unspecified obesity type, unspecified whether serious comorbidity present    PLAN:    In order of problems listed above: Substernal chest pain, moderate, nonobstructive CAD Some mixed typical and atypical features.  Recent  work-up in ED overall unremarkable.  EKG did not reveal any acute ischemic changes.  Recent troponin normal.  We will arrange coronary CTA for further work-up since last one was done in 2020, results showed moderate CAD in proximal mid LAD with FFR.  We will obtain BMET next week. Rx one time dose of Metoprolol tartrate 100 mg daily prior to coronary CTA. Will initiate sublingual nitroglycerin as needed for chest pain.  ED precautions discussed.  Continue losartan and pravastatin. Heart healthy diet and regular cardiovascular exercise as tolerated encouraged.   Hyperlipidemia Lipid panel 1 year ago revealed LDL 80, total cholesterol 166, HDL 67.  She is currently at goal.  Continue pravastatin 80 mg daily. Heart healthy diet and regular cardiovascular exercise as tolerated encouraged.   Bilateral lower extremity leg swelling (left > R), peripheral venous insufficiency Normal ABIs, TBI's in 2021.  Recent Doppler in ED was negative for any DVT.  Etiology most likely to peripheral vascular insufficiency.  Recommended compression stockings, low-salt diet, leg elevation with foot pumping exercises.  Since most recent kidney function is normal, we will initiate Lasix 40 mg daily x5 days, with potassium chloride supplement 20 mill equivalents daily x5 days, then stop.  We will check BMET next week. Continue to follow with PCP.  Hypertension Blood pressure well controlled today.  Continue losartan 25 mg daily. Heart healthy diet and regular cardiovascular exercise as tolerated encouraged.  We will check BMET next week.  Asthma, dyspnea on exertion Breathing is still the same since hospital discharge.  2 view chest x-ray did not reveal anything acute.  Minimal, diminished expiratory wheezing along right posterior lung fields.  Continue to follow-up with asthma specialist.  Continue current asthma medications as well as Xyzal and Xolair.  We will obtain TTE to evaluate left heart function and arranging coronary  CTA.  Recent BNP normal.  Overall she is euvolemic and well compensated on exam besides chronic, bilateral lower extremity edema.  Initiating Lasix and potassium supplementation as mentioned above. ED precautions discussed. Continue to follow with PCP.  6. Class 2 Obesity BMI 37.65.  Weight is overall stable since previous month.  She currently defers any referral to healthy weight and wellness, prep program, or [facility].  We will obtain TSH next week. Weight loss via diet and exercise encouraged. Discussed the impact being overweight would have on cardiovascular risk.    7.  Disposition: Follow-up with APP in 4 to 6 weeks or sooner if anything changes.  Medication Adjustments/Labs and Tests Ordered: Current medicines are reviewed at length with the patient today.  Concerns regarding medicines are outlined above.  Orders Placed This Encounter  Procedures   CT CORONARY MORPH W/CTA COR W/SCORE W/CA W/CM &/OR WO/CM   Basic metabolic panel   TSH   ECHOCARDIOGRAM COMPLETE   Meds ordered this encounter  Medications   furosemide (LASIX) 40 MG tablet    Sig: Take 1 tablet (40 mg total) by mouth daily. ONCE DAILY FOR 5  DAYS AND THEN STOP    Dispense:  5 tablet    Refill:  0   potassium chloride SA (KLOR-CON M20) 20 MEQ tablet    Sig: Take 1 tablet (20 mEq total) by mouth daily. TAKE ONCE DAILY FOR 5 DAYS AND THEN STOP    Dispense:  5 tablet    Refill:  0   nitroGLYCERIN (NITROSTAT) 0.4 MG SL tablet    Sig: Place 1 tablet (0.4 mg total) under the tongue every 5 (five) minutes as needed for chest pain.    Dispense:  25 tablet    Refill:  3   metoprolol tartrate (LOPRESSOR) 100 MG tablet    Sig: Take 1 tablet (100 mg total) by mouth once for 1 dose. PLEASE TAKE METOPROLOL 2  HOURS PRIOR TO CTA SCAN.    Dispense:  1 tablet    Refill:  0    Patient Instructions  Medication Instructions:   START: LASIX (FUROSEMIDE) '40mg'$  ONCE DAILY FOR 5 DAYS TOTAL AND THEN STOP   START: POTASSIUM 24mq  ONCE DAILY FOR 5 DAYS TOTAL AND THEN STOP   PLEASE TAKE '100mg'$  METOPROLOL TWO HOURS PRIOR TO CCTA SCAN   NITROGLYCERIN  UNDER THE TONGUE AS NEEDED EVERY 5 MINS FOR CHEST PAIN. DO NOT TAKE MORE THAN 3 DOSES WITH IN 15 MINUTES    NITROGLYCERIN  is a type of vasodilator. It relaxes blood vessels, increasing the blood and oxygen supply to your heart. This medicine is used to relieve chest pain caused by angina.  In an angina attack (CHEST PAIN), you should feel better within 5 minutes after your first dose. Do not swallow whole. Place tablet under your tongue. Sit down when taking this medicine. You can take a dose every 5 minutes up to a total of 3 doses. If you do not feel better or feel worse after 1 dose, call 9-1-1 at once. Do not take more than 3 doses in 15 minutes. Do not take your medicine more often than directed.   *If you need a refill on your cardiac medications before your next appointment, please call your pharmacy*  Lab Work:  Please return for Blood Work in [place] No appointment needed, lab here at the office is open Monday-Friday from 8AM to 4PM and closed daily for lunch from 12:45-1:45. NO NEED TO FAST   If you have labs (blood work) drawn today and your tests are completely normal, you will receive your results only by: MyChart Message (if you have MyChart) OR A paper copy in the mail If you have any lab test that is abnormal or we need to change your treatment, we will call you to review the results.  Testing/Procedures: Your physician has requested that you have cardiac CT. Cardiac computed tomography (CT) is a painless test that uses an x-ray machine to take clear, detailed pictures of your heart. For further information please visit [email or web address].tn Please follow instruction sheet as given.  Your physician has requested that you have an echocardiogram. Echocardiography is a painless test that uses sound waves to create images of your heart. It provides your doctor  with information about the size and shape of your heart and how well your heart's chambers and valves are working. You may receive an ultrasound enhancing agent through an IV if needed to better visualize your heart during the echo.This procedure takes approximately one hour. There are no restrictions for this procedure. This will take place at the [address].  Follow-Up: At [facility], you and your health needs are our priority.  As part of our continuing mission to provide you with exceptional heart care, we have created designated Provider Care Teams.  These Care Teams include your primary Cardiologist (physician) and Advanced Practice Providers (APPs -  Physician Assistants and Nurse Practitioners) who all work together to provide you with the care you need, when you need it.  Your next appointment:   4 week(s)  The format for your next appointment:   In Person  Provider: ANY APP     Your cardiac CT will be scheduled at one of the below locations:   [facility] [address] [place], [place] [address] [phone]  If scheduled at [facility], please arrive at the [facility] and Children's Entrance (Entrance C2) of [facility] 30 minutes prior to test start time. You can use the FREE valet parking offered at entrance C (encouraged to control the heart rate for the test)  Proceed to the [facility] Radiology Department (first floor) to check-in and test prep.  All radiology patients and guests should use entrance C2 at [facility], accessed from [facility], even though the hospital's physical address listed is [address]    If scheduled at [facility] or [facility], please arrive 15 mins early for check-in and test prep.   Please follow these instructions carefully (unless otherwise directed):  Hold all erectile dysfunction medications at  least 3 days (72 hrs) prior to test. (Ie viagra, cialis, sildenafil, tadalafil, etc) We will administer nitroglycerin during this exam.   On the Night Before the Test: Be sure to Drink plenty of water. Do not consume any caffeinated/decaffeinated beverages or chocolate 12 hours prior to your test. Do not take any antihistamines 12 hours prior to your test.  On the Day of the Test: Drink plenty of water until 1 hour prior to the test. Do not eat any food 1 hour prior to test. You may take your regular medications prior to the test.  Take metoprolol (Lopressor) two hours prior to test. HOLD Furosemide/Hydrochlorothiazide morning of the test. FEMALES- please wear underwire-free bra if available, avoid dresses & tight clothing      After the Test: Drink plenty of water. After receiving IV contrast, you may experience a mild flushed feeling. This is normal. On occasion, you may experience a mild rash up to 24 hours after the test. This is not dangerous. If this occurs, you can take Benadryl 25 mg and increase your fluid intake. If you experience trouble breathing, this can be serious. If it is severe call 911 IMMEDIATELY. If it is mild, please call our office. If you take any of these medications: Glipizide/Metformin, Avandament, Glucavance, please do not take 48 hours after completing test unless otherwise instructed.  We will call to schedule your test 2-4 weeks out understanding that some insurance companies will need an authorization prior to the service being performed.   For non-scheduling related questions, please contact the cardiac imaging nurse navigator should you have any questions/concerns: Marchia Bond, Cardiac Imaging Nurse Navigator Gordy Clement, Cardiac Imaging Nurse Navigator [facility] Direct Office Dial: [phone]   For scheduling needs, including cancellations and rescheduling, please call [place], [phone].            Signed, Finis Bud, NP  04/28/2022 9:13 AM    [place]

## 2022-04-28 ENCOUNTER — Ambulatory Visit: Payer: Medicare Other | Attending: Student | Admitting: Nurse Practitioner

## 2022-04-28 ENCOUNTER — Encounter: Payer: Self-pay | Admitting: Student

## 2022-04-28 VITALS — BP 126/82 | HR 93 | Wt 186.4 lb

## 2022-04-28 DIAGNOSIS — E785 Hyperlipidemia, unspecified: Secondary | ICD-10-CM | POA: Diagnosis not present

## 2022-04-28 DIAGNOSIS — E6609 Other obesity due to excess calories: Secondary | ICD-10-CM | POA: Diagnosis present

## 2022-04-28 DIAGNOSIS — J45909 Unspecified asthma, uncomplicated: Secondary | ICD-10-CM | POA: Insufficient documentation

## 2022-04-28 DIAGNOSIS — R0609 Other forms of dyspnea: Secondary | ICD-10-CM | POA: Diagnosis present

## 2022-04-28 DIAGNOSIS — M7989 Other specified soft tissue disorders: Secondary | ICD-10-CM | POA: Insufficient documentation

## 2022-04-28 DIAGNOSIS — I25119 Atherosclerotic heart disease of native coronary artery with unspecified angina pectoris: Secondary | ICD-10-CM | POA: Diagnosis not present

## 2022-04-28 DIAGNOSIS — M329 Systemic lupus erythematosus, unspecified: Secondary | ICD-10-CM | POA: Insufficient documentation

## 2022-04-28 DIAGNOSIS — Z6837 Body mass index (BMI) 37.0-37.9, adult: Secondary | ICD-10-CM | POA: Insufficient documentation

## 2022-04-28 DIAGNOSIS — R072 Precordial pain: Secondary | ICD-10-CM | POA: Insufficient documentation

## 2022-04-28 DIAGNOSIS — I10 Essential (primary) hypertension: Secondary | ICD-10-CM | POA: Diagnosis present

## 2022-04-28 DIAGNOSIS — E669 Obesity, unspecified: Secondary | ICD-10-CM | POA: Diagnosis present

## 2022-04-28 DIAGNOSIS — I872 Venous insufficiency (chronic) (peripheral): Secondary | ICD-10-CM | POA: Diagnosis present

## 2022-04-28 DIAGNOSIS — I2510 Atherosclerotic heart disease of native coronary artery without angina pectoris: Secondary | ICD-10-CM | POA: Insufficient documentation

## 2022-04-28 MED ORDER — FUROSEMIDE 40 MG PO TABS: 40.0000 mg | ORAL_TABLET | Freq: Every day | ORAL | 0 refills | Status: DC

## 2022-04-28 MED ORDER — POTASSIUM CHLORIDE CRYS ER 20 MEQ PO TBCR: 20.0000 meq | EXTENDED_RELEASE_TABLET | Freq: Every day | ORAL | 0 refills | Status: DC

## 2022-04-28 MED ORDER — NITROGLYCERIN 0.4 MG SL SUBL: 0.4000 mg | SUBLINGUAL_TABLET | SUBLINGUAL | 3 refills | Status: DC | PRN

## 2022-04-28 MED ORDER — METOPROLOL TARTRATE 100 MG PO TABS: 100.0000 mg | ORAL_TABLET | Freq: Once | ORAL | 0 refills | Status: DC

## 2022-04-28 NOTE — Patient Instructions (Addendum)
Medication Instructions:   START: LASIX (FUROSEMIDE) '40mg'$  ONCE DAILY FOR 5 DAYS TOTAL AND THEN STOP   START: POTASSIUM 78mq ONCE DAILY FOR 5 DAYS TOTAL AND THEN STOP   PLEASE TAKE '100mg'$  METOPROLOL TWO HOURS PRIOR TO CCTA SCAN   NITROGLYCERIN  UNDER THE TONGUE AS NEEDED EVERY 5 MINS FOR CHEST PAIN. DO NOT TAKE MORE THAN 3 DOSES WITH IN 15 MINUTES    NITROGLYCERIN  is a type of vasodilator. It relaxes blood vessels, increasing the blood and oxygen supply to your heart. This medicine is used to relieve chest pain caused by angina.  In an angina attack (CHEST PAIN), you should feel better within 5 minutes after your first dose. Do not swallow whole. Place tablet under your tongue. Sit down when taking this medicine. You can take a dose every 5 minutes up to a total of 3 doses. If you do not feel better or feel worse after 1 dose, call 9-1-1 at once. Do not take more than 3 doses in 15 minutes. Do not take your medicine more often than directed.   *If you need a refill on your cardiac medications before your next appointment, please call your pharmacy*  Lab Work:  Please return for Blood Work in [place] No appointment needed, lab here at the office is open Monday-Friday from 8AM to 4PM and closed daily for lunch from 12:45-1:45. NO NEED TO FAST   If you have labs (blood work) drawn today and your tests are completely normal, you will receive your results only by: MyChart Message (if you have MyChart) OR A paper copy in the mail If you have any lab test that is abnormal or we need to change your treatment, we will call you to review the results.  Testing/Procedures: Your physician has requested that you have cardiac CT. Cardiac computed tomography (CT) is a painless test that uses an x-ray machine to take clear, detailed pictures of your heart. For further information please visit [email or web address] Please follow instruction sheet as given.  Your physician has requested that you have an  echocardiogram. Echocardiography is a painless test that uses sound waves to create images of your heart. It provides your doctor with information about the size and shape of your heart and how well your heart's chambers and valves are working. You may receive an ultrasound enhancing agent through an IV if needed to better visualize your heart during the echo.This procedure takes approximately one hour. There are no restrictions for this procedure. This will take place at the [address].   Follow-Up: [facility] are our [facility].  As part of our continuing mission to provide you with exceptional heart care, we have created designated Provider Care Teams.  These Care Teams include your primary Cardiologist (physician) and Advanced Practice Providers (APPs -  Physician Assistants and Nurse Practitioners) who all work together to provide you with the care you need, when you need it.  Your next appointment:   4 week(s)  The format for your next appointment:   In Person  Provider: ANY APP     Your cardiac CT will be scheduled at one of the below locations:   [facility] [phone] [facility] to test start time. You can use the FREE  valet parking offered at entrance C (encouraged to control the heart rate for the test)  Proceed to the [facility] Radiology Department (first floor) to check-in and test prep.  All radiology patients and guests should use entrance C2 at [facility], accessed from [facility], even though the hospital's physical address listed is [address].    If scheduled at [facility] or [place], please arrive 15 mins early for check-in and test  prep.   Please follow these instructions carefully (unless otherwise directed):  Hold all erectile dysfunction medications at least 3 days (72 hrs) prior to test. (Ie viagra, cialis, sildenafil, tadalafil, etc) We will administer nitroglycerin during this exam.   On the Night Before the Test: Be sure to Drink plenty of water. Do not consume any caffeinated/decaffeinated beverages or chocolate 12 hours prior to your test. Do not take any antihistamines 12 hours prior to your test.  On the Day of the Test: Drink plenty of water until 1 hour prior to the test. Do not eat any food 1 hour prior to test. You may take your regular medications prior to the test.  Take metoprolol (Lopressor) two hours prior to test. HOLD Furosemide/Hydrochlorothiazide morning of the test. FEMALES- please wear underwire-free bra if available, avoid dresses & tight clothing      After the Test: Drink plenty of water. After receiving IV contrast, you may experience a mild flushed feeling. This is normal. On occasion, you may experience a mild rash up to 24 hours after the test. This is not dangerous. If this occurs, you can take Benadryl 25 mg and increase your fluid intake. If you experience trouble breathing, this can be serious. If it is severe call 911 IMMEDIATELY. If it is mild, please call our office. If you take any of these medications: Glipizide/Metformin, Avandament, Glucavance, please do not take 48 hours after completing test unless otherwise instructed.  We will call to schedule your test 2-4 weeks out understanding that some insurance companies will need an authorization prior to the service being performed.   For non-scheduling related questions, please contact the cardiac imaging nurse navigator should you have any questions/concerns: Marchia Bond, Cardiac Imaging Nurse Navigator Gordy Clement, Cardiac Imaging Nurse Navigator [facility] Direct Office Dial:  [phone]   For scheduling needs, including cancellations and rescheduling, please call [place], [phone].

## 2022-05-03 NOTE — Progress Notes (Unsigned)
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  We can increase to '25mg'$  at bedtime in 4 weeks if needed. NCV-EMG of left upper and lower extremities Check neuropathy labs:  B12, TSH, SSA/SSB antibodies, ACE, IFE Limit use of pain relievers to no more than 2 days out of week to prevent risk of rebound or medication-overuse headache. Follow up 4-5 months.    Subjective:  Kendra Gilbert is a 66 year old right-handed female with CAD, HTN, HLD, Osteoarthritis (had RA from childhood to mid-60s), asthma, bilateral cataracts, non-alcoholic cirrhosis and history of colon cancer and childhood rheumatic fever who presents for headache and facial pain.  History supplemented by referring provider's note.  In October 2022, she fell and hit her forehead and face on concrete after tripping outside.  She sustained forehead hematoma..  She was seen in the ED where CT head and maxillofacial personally reviewed were negative for acute fractures or intracranial abnormalities.  Since then, she started having headaches.  They are left sided (rarely right sided) pressure in the eye and left side of head, a dull pressure that gradually becomes severe later in the day.  Associated photosensitivity.  No nausea, vomiting, phonophobia, visual disturbance, or unilateral numbness or weakness.  It is persistent.  Has some neck pain but subsequently underwent left shoulder surgery.  Reading and working on computer aggravates it.  Does not treat with analgesics.  She was treated at [facility] for concussion which helped.  She underwent vestibular rehab.  She was experiencing  rhinorrhea and has a history of chronic sinusitis, so it was suspected that her ongoing headaches were related to allergies and sinusitis.  She saw ENT.  CT sinuses on 08/27/2021 were normal.  Eye exams have been unremarkable except for dry eye for which she received drops.    In addition, over the past year she notes burning in her feet and hands.  It is also persistent.  Involves the left foot and calf and lateral right foot.  Involves the fingers in the hand.  Sometimes her fingers or toes will curl and get stuck.  She had rheumatoid arthritis since childhood but was told by rheumatology that it changed to osteoarthritis a couple of years ago.  No longer on Plaquenil.  Sed rate and CRP this month were 104 and 11.3 respectively.  Autoimmune labs from June 2022 include sed rate 89, negative RF, negative CCP antibody, ds-DNA antibody 4, negative RNP antibody.  She has neck and back pain.  Sometimes she feels pain down the legs as well.  She did injure her left ankle in the fall.    PAST MEDICAL HISTORY: Past Medical History:  Diagnosis Date   Asthma    CAD (coronary artery disease)    Moderate, nonobstructive CAD as seen on coronary CTA in 2020 with normal FFR.   Cataracts, bilateral    Chronic kidney disease    cyst in left kidney   Class 2 obesity    Colon cancer [facility]) 2017   surgical tx  removal of polyp   Concussion 04/20/2021   Coronary artery calcification    a. POET in 2015 with no acute findings.   Diarrhea since 02-16-17   c dif test normal  per pt   Dyspnea     with exertion, occ at rest no oxygen or inhaler use   GERD (gastroesophageal reflux disease)    H/O: rheumatic fever as child   Headache    occ left side migraine   Hyperlipidemia    Hypertension    Leg edema    Lung disease    Lung nodule   Non-alcoholic cirrhosis ([place]) 5053   no current issues with   Pneumonia 2016   Rheumatoid arthritis ([place])    oa also, back arthritis, hx rheumatoid     PAST SURGICAL  HISTORY: Past Surgical History:  Procedure Laterality Date   ABDOMINAL HYSTERECTOMY     ovaries left   ANKLE SURGERY Left 02/12/2021   COLONOSCOPY WITH PROPOFOL N/A 03/23/2017   Procedure: COLONOSCOPY WITH PROPOFOL;  Surgeon: Arta Silence, MD;  Location: [facility];  Service: Endoscopy;  Laterality: N/A;   colonscopy  2017   DILATION AND CURETTAGE OF UTERUS     few done for endometriosis   GASTROC RECESSION EXTREMITY Left 02/12/2021   Procedure: Left gastroc recession;  Surgeon: Wylene Simmer, MD;  Location: [facility];  Service: Orthopedics;  Laterality: Left;   ROTATOR CUFF REPAIR Left 09/2021   athroscopic and open    MEDICATIONS: Current Outpatient Medications on File Prior to Visit  Medication Sig Dispense Refill   acetaminophen (TYLENOL) 500 MG tablet Take 325 mg by mouth daily as needed for mild pain, fever or headache.     Budeson-Glycopyrrol-Formoterol (BREZTRI AEROSPHERE) 160-9-4.8 MCG/ACT AERO Inhale two puffs twice daily to prevent cough or wheeze. Rinse mouth after use. 10.7 g 5   diphenhydrAMINE (BENADRYL) 25 MG tablet Take 25 mg by mouth every 6 (six) hours as needed.     EPINEPHrine 0.3 mg/0.3 mL IJ SOAJ injection Inject 0.3 mg into the muscle as needed for anaphylaxis. 2 each 1   fluticasone (FLOVENT HFA) 220 MCG/ACT inhaler Inhale 2 puffs into the lungs 2 (two) times daily. 1 each 5   furosemide (LASIX) 40 MG tablet Take 1 tablet (40 mg total) by mouth daily. ONCE DAILY FOR 5 DAYS AND THEN STOP 5 tablet 0   ipratropium-albuterol (DUONEB) 0.5-2.5 (3) MG/3ML SOLN Use 1 vial via nebulizer every 4-6 hours as needed for cough, wheeze, shortness of breath or chest tightness 180 mL 1   levocetirizine (XYZAL) 5 MG tablet Take 1 tablet (5 mg total) by mouth every evening. 90 tablet 1   losartan (COZAAR) 25 MG tablet TAKE 1 TABLET(25 MG) BY MOUTH IN THE MORNING AND AT BEDTIME 180 tablet 3   metoprolol tartrate (LOPRESSOR) 100 MG tablet Take 1 tablet (100 mg  total) by mouth once for 1 dose. PLEASE TAKE METOPROLOL 2  HOURS PRIOR TO CTA SCAN. 1 tablet 0   nitroGLYCERIN (NITROSTAT) 0.4 MG SL tablet Place 1 tablet (0.4 mg total) under the tongue every 5 (five) minutes as needed for chest pain. 25 tablet 3   pantoprazole (PROTONIX) 20 MG tablet Take 20 mg by mouth daily.     potassium chloride SA (KLOR-CON M20) 20 MEQ tablet Take 1 tablet (20 mEq total) by mouth daily. TAKE ONCE DAILY FOR 5 DAYS AND THEN STOP 5 tablet 0   pravastatin (PRAVACHOL) 80 MG tablet Take 1 tablet (80 mg total) by mouth daily. 90 tablet 3   prednisoLONE acetate (PRED FORTE) 1 % ophthalmic suspension      RESTASIS 0.05 % ophthalmic emulsion 1 drop 2 (two) times daily.  Sodium Chloride-Sodium Bicarb 1.57 g PACK Place into the nose.     SYMBICORT 160-4.5 MCG/ACT inhaler INHALE 2 PUFFS INTO THE LUNGS TWICE DAILY 10.2 g 5   VENTOLIN HFA 108 (90 Base) MCG/ACT inhaler Inhale 2 puffs into the lungs every 4 (four) hours as needed for wheezing or shortness of breath. 18 g 1   Current Facility-Administered Medications on File Prior to Visit  Medication Dose Route Frequency Provider Last Rate Last Admin   omalizumab Arvid Right) injection 375 mg  375 mg Subcutaneous Q14 Days Kennith Gain, MD   375 mg at 11/24/20 0830    ALLERGIES: Allergies  Allergen Reactions   Amlodipine Shortness Of Breath and Swelling   Bee Venom Anaphylaxis and Hives   Lisinopril Itching and Cough    Dry cough and severe itching in the throat    Other Other (See Comments)    Mosquito bites cause hives all over body   Aspirin Other (See Comments)    Causes stomach problems   Cefdinir     Other reaction(s): hives   Erythromycin Other (See Comments)    Ate the lining of her stomach   Hctz [Hydrochlorothiazide]     Rash    Penicillins Rash    Has patient had a PCN reaction causing immediate rash, facial/tongue/throat swelling, SOB or lightheadedness with hypotension: Yes Has patient had a PCN  reaction causing severe rash involving mucus membranes or skin necrosis: Yes Has patient had a PCN reaction that required hospitalization: No  Has patient had a PCN reaction occurring within the last 10 years: No If all of the above answers are "NO", then may proceed with Cephalosporin use.     FAMILY HISTORY: Family History  Problem Relation Age of Onset   CAD Mother 73       CABG/stents   Breast cancer Mother    Kidney failure Mother    Asthma Father    CAD Father 61   Allergies Father    CAD Brother    CAD Brother 96       9 stents   CAD Brother 28       7 stents   Heart attack Brother     Objective:  Blood pressure 111/65, pulse 86, height '4\' 11"'$  (1.499 m), weight 182 lb (82.6 kg), SpO2 98 %. General: No acute distress.  Patient appears well-groomed.   Head:  Normocephalic/atraumatic Eyes:  fundi examined but not visualized Neck: supple, mild paraspinal tenderness, full range of motion Back: mild paraspinal tenderness Heart: regular rate and rhythm Lungs: Clear to auscultation bilaterally. Vascular: No carotid bruits. Neurological Exam: Mental status: alert and oriented to person, place, and time, speech fluent and not dysarthric, language intact. Cranial nerves: CN I: not tested CN II: pupils equal, round and reactive to light, visual fields intact CN III, IV, VI:  full range of motion, no nystagmus, no ptosis CN V: facial sensation intact. CN VII: upper and lower face symmetric CN VIII: hearing intact CN IX, X: gag intact, uvula midline CN XI: sternocleidomastoid and trapezius muscles intact CN XII: tongue midline Bulk & Tone: normal, no fasciculations. Motor:  muscle strength 5/5 throughout Sensation:  Pinprick and vibratory sensation reduced in feet. Deep Tendon Reflexes:  2+ throughout,  toes downgoing.   Finger to nose testing:  Without dysmetria.    Gait:  Normal station and stride.  Romberg negative.    Thank you for allowing me to take part in the  care of this patient.  Metta Clines, DO  CC:  Marda Stalker, PA-C  Pietro Cassis, [place]

## 2022-05-04 ENCOUNTER — Other Ambulatory Visit (INDEPENDENT_AMBULATORY_CARE_PROVIDER_SITE_OTHER): Payer: Medicare Other

## 2022-05-04 ENCOUNTER — Ambulatory Visit (INDEPENDENT_AMBULATORY_CARE_PROVIDER_SITE_OTHER): Payer: Medicare Other | Admitting: Neurology

## 2022-05-04 ENCOUNTER — Encounter: Payer: Self-pay | Admitting: Neurology

## 2022-05-04 VITALS — BP 111/65 | HR 86 | Ht 59.0 in | Wt 182.0 lb

## 2022-05-04 DIAGNOSIS — G44321 Chronic post-traumatic headache, intractable: Secondary | ICD-10-CM

## 2022-05-04 DIAGNOSIS — R208 Other disturbances of skin sensation: Secondary | ICD-10-CM

## 2022-05-04 DIAGNOSIS — I25119 Atherosclerotic heart disease of native coronary artery with unspecified angina pectoris: Secondary | ICD-10-CM

## 2022-05-04 LAB — TSH: TSH: 1.95 u[IU]/mL (ref 0.35–5.50)

## 2022-05-04 LAB — VITAMIN B12: Vitamin B-12: 519 pg/mL (ref 211–911)

## 2022-05-04 MED ORDER — NORTRIPTYLINE HCL 10 MG PO CAPS: 10.0000 mg | ORAL_CAPSULE | Freq: Every day | ORAL | 5 refills | Status: DC

## 2022-05-04 NOTE — Patient Instructions (Signed)
Start nortriptyline '10mg'$  at bedtime.  If no improvement in headaches in 4 weeks, contact me and we can increase dose Limit use of pain relievers to no more than 2 days out of week to prevent risk of rebound or medication-overuse headache. Check nerve study of left arm and leg. Check B12, TSH, SSA/SSB antibodies, ACE, IFE Follow up 4-5 months.

## 2022-05-05 LAB — BASIC METABOLIC PANEL
BUN/Creatinine Ratio: 36 — ABNORMAL HIGH (ref 12–28)
BUN: 32 mg/dL — ABNORMAL HIGH (ref 8–27)
CO2: 29 mmol/L (ref 20–29)
Calcium: 9.8 mg/dL (ref 8.7–10.3)
Chloride: 102 mmol/L (ref 96–106)
Creatinine, Ser: 0.88 mg/dL (ref 0.57–1.00)
Glucose: 91 mg/dL (ref 70–99)
Potassium: 4.4 mmol/L (ref 3.5–5.2)
Sodium: 141 mmol/L (ref 134–144)
eGFR: 72 mL/min/{1.73_m2} (ref >59)

## 2022-05-05 LAB — SJOGREN'S SYNDROME ANTIBODS(SSA + SSB)
SSA (Ro) (ENA) Antibody, IgG: <1 AI
SSB (La) (ENA) Antibody, IgG: <1 AI

## 2022-05-05 LAB — ANGIOTENSIN CONVERTING ENZYME: Angiotensin-Converting Enzyme: 27 U/L (ref 9–67)

## 2022-05-05 LAB — TSH: TSH: 2.28 u[IU]/mL (ref 0.450–4.500)

## 2022-05-07 LAB — IMMUNOFIXATION, SERUM
IgA/Immunoglobulin A, Serum: 569 mg/dL — ABNORMAL HIGH (ref 87–352)
IgG (Immunoglobin G), Serum: 1652 mg/dL — ABNORMAL HIGH (ref 586–1602)
IgM (Immunoglobulin M), Srm: 196 mg/dL (ref 26–217)

## 2022-05-12 ENCOUNTER — Ambulatory Visit (HOSPITAL_COMMUNITY): Payer: Medicare Other | Attending: Cardiology

## 2022-05-12 DIAGNOSIS — R0609 Other forms of dyspnea: Secondary | ICD-10-CM | POA: Insufficient documentation

## 2022-05-12 DIAGNOSIS — R072 Precordial pain: Secondary | ICD-10-CM | POA: Diagnosis present

## 2022-05-12 LAB — ECHOCARDIOGRAM COMPLETE
Area-P 1/2: 4.06 cm2
S' Lateral: 1.5 cm

## 2022-05-12 MED ORDER — PERFLUTREN LIPID MICROSPHERE
1.0000 mL | INTRAVENOUS | Status: AC | PRN
  Administered 2022-05-12: 2 mL via INTRAVENOUS

## 2022-05-17 ENCOUNTER — Telehealth (HOSPITAL_COMMUNITY): Payer: Self-pay | Admitting: Emergency Medicine

## 2022-05-17 NOTE — Telephone Encounter (Signed)
Attempted to call patient regarding upcoming cardiac CT appointment. °Left message on voicemail with name and callback number °Cadie Sorci RN Navigator Cardiac Imaging °[facility] °[phone] Office °[phone] Cell ° °

## 2022-05-17 NOTE — Telephone Encounter (Signed)
Reaching out to patient to offer assistance regarding upcoming cardiac imaging study; pt verbalizes understanding of appt date/time, parking situation and where to check in, pre-test NPO status and medications ordered, and verified current allergies; name and call back number provided for further questions should they arise Kendra Bond RN Navigator Cardiac Imaging [facility] [phone] office [phone] cell  Denies iv issues- L arm recent surgery to shoulder Arrival [address]

## 2022-05-18 ENCOUNTER — Ambulatory Visit (HOSPITAL_COMMUNITY)
Admission: RE | Admit: 2022-05-18 | Discharge: 2022-05-18 | Disposition: A | Discharge status: Home / Self Care | Payer: Medicare Other | Source: Ambulatory Visit | Attending: Nurse Practitioner | Admitting: Nurse Practitioner

## 2022-05-18 ENCOUNTER — Ambulatory Visit (HOSPITAL_BASED_OUTPATIENT_CLINIC_OR_DEPARTMENT_OTHER)
Admission: RE | Admit: 2022-05-18 | Discharge: 2022-05-18 | Disposition: A | Discharge status: Home / Self Care | Payer: Medicare Other | Source: Ambulatory Visit | Attending: Internal Medicine | Admitting: Internal Medicine

## 2022-05-18 ENCOUNTER — Other Ambulatory Visit: Payer: Self-pay | Admitting: Internal Medicine

## 2022-05-18 DIAGNOSIS — R931 Abnormal findings on diagnostic imaging of heart and coronary circulation: Secondary | ICD-10-CM

## 2022-05-18 DIAGNOSIS — I2510 Atherosclerotic heart disease of native coronary artery without angina pectoris: Secondary | ICD-10-CM

## 2022-05-18 DIAGNOSIS — R072 Precordial pain: Secondary | ICD-10-CM | POA: Diagnosis present

## 2022-05-18 MED ORDER — IOHEXOL 350 MG/ML SOLN
100.0000 mL | Freq: Once | INTRAVENOUS | Status: AC | PRN
  Administered 2022-05-18: 100 mL via INTRAVENOUS

## 2022-05-18 MED ORDER — NITROGLYCERIN 0.4 MG SL SUBL
SUBLINGUAL_TABLET | SUBLINGUAL | Status: AC
  Filled 2022-05-18: qty 2

## 2022-05-18 MED ORDER — NITROGLYCERIN 0.4 MG SL SUBL
0.8000 mg | SUBLINGUAL_TABLET | Freq: Once | SUBLINGUAL | Status: AC
  Administered 2022-05-18: 0.8 mg via SUBLINGUAL

## 2022-05-20 ENCOUNTER — Telehealth: Payer: Self-pay | Admitting: Neurology

## 2022-05-20 NOTE — Telephone Encounter (Signed)
See result notes. 

## 2022-05-20 NOTE — Telephone Encounter (Signed)
Pt called back in returning a call about results

## 2022-05-24 ENCOUNTER — Ambulatory Visit: Payer: Medicare Other | Attending: Nurse Practitioner | Admitting: Nurse Practitioner

## 2022-05-24 ENCOUNTER — Encounter: Payer: Self-pay | Admitting: Nurse Practitioner

## 2022-05-24 VITALS — BP 128/76 | HR 71 | Ht 59.0 in | Wt 183.4 lb

## 2022-05-24 DIAGNOSIS — I10 Essential (primary) hypertension: Secondary | ICD-10-CM | POA: Insufficient documentation

## 2022-05-24 DIAGNOSIS — R600 Localized edema: Secondary | ICD-10-CM | POA: Insufficient documentation

## 2022-05-24 DIAGNOSIS — E785 Hyperlipidemia, unspecified: Secondary | ICD-10-CM | POA: Diagnosis not present

## 2022-05-24 DIAGNOSIS — R0609 Other forms of dyspnea: Secondary | ICD-10-CM | POA: Insufficient documentation

## 2022-05-24 DIAGNOSIS — I25118 Atherosclerotic heart disease of native coronary artery with other forms of angina pectoris: Secondary | ICD-10-CM | POA: Insufficient documentation

## 2022-05-24 MED ORDER — ISOSORBIDE MONONITRATE ER 30 MG PO TB24: 15.0000 mg | ORAL_TABLET | Freq: Every day | ORAL | 3 refills | Status: DC

## 2022-05-24 MED ORDER — FUROSEMIDE 40 MG PO TABS: ORAL_TABLET | ORAL | 3 refills | Status: DC

## 2022-05-24 NOTE — Patient Instructions (Addendum)
Medication Instructions:  Imdur 15 mg daily Lasix 40 mg daily as needed for weight gain of 3 lb over night or 5 lb in 1 week.   *If you need a refill on your cardiac medications before your next appointment, please call your pharmacy*   Lab Work: Your physician recommends that you complete lab work today. Lipids Lfts  MyChart Message (if you have MyChart) OR A paper copy in the mail If you have any lab test that is abnormal or we need to change your treatment, we will call you to review the results.   Testing/Procedures: NONE ordered at this time of appointment     Follow-Up: At [facility], you and your health needs are our priority.  As part of our continuing mission to provide you with exceptional heart care, we have created designated Provider Care Teams.  These Care Teams include your primary Cardiologist (physician) and Advanced Practice Providers (APPs -  Physician Assistants and Nurse Practitioners) who all work together to provide you with the care you need, when you need it.  We recommend signing up for the patient portal called "MyChart".  Sign up information is provided on this After Visit Summary.  MyChart is used to connect with patients for Virtual Visits (Telemedicine).  Patients are able to view lab/test results, encounter notes, upcoming appointments, etc.  Non-urgent messages can be sent to your provider as well.   To learn more about what you can do with MyChart, go to [email or web address].    Your next appointment:   3 month(s)  The format for your next appointment:   In Person  Provider:   Minus Breeding, MD     Other Instructions   Important Information About Sugar

## 2022-05-24 NOTE — Progress Notes (Addendum)
Office Visit    Patient Name: Kendra Gilbert Date of Encounter: 05/24/2022  Primary Care Provider:  Marda Stalker, PA-C Primary Cardiologist:  Minus Breeding, MD  Chief Complaint    66 year old female with a history of CAD, hypertension, hyperlipidemia, GERD, asthma and rheumatoid arthritis who presents for follow-up related to CAD.  Past Medical History    Past Medical History:  Diagnosis Date   Asthma    CAD (coronary artery disease)    Moderate, nonobstructive CAD as seen on coronary CTA in 2020 with normal FFR.   Cataracts, bilateral    Chronic kidney disease    cyst in left kidney   Class 2 obesity    Colon cancer [facility]) 2017   surgical tx  removal of polyp   Concussion 04/20/2021   Coronary artery calcification    a. POET in 2015 with no acute findings.   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   no current issues with   Pneumonia 2016   Rheumatoid arthritis ([place])    oa also, back arthritis, hx rheumatoid    Past Surgical History:  Procedure Laterality Date   ABDOMINAL HYSTERECTOMY     ovaries left   ANKLE SURGERY Left 02/12/2021   COLONOSCOPY WITH PROPOFOL N/A 03/23/2017   Procedure: COLONOSCOPY WITH PROPOFOL;  Surgeon: Arta Silence, MD;  Location: [facility];  Service: Endoscopy;  Laterality: N/A;   colonscopy  2017   DILATION AND CURETTAGE OF UTERUS     few done for endometriosis   GASTROC RECESSION EXTREMITY Left 02/12/2021   Procedure: Left gastroc recession;  Surgeon: Wylene Simmer, MD;  Location: [place];  Service: Orthopedics;  Laterality: Left;   ROTATOR CUFF REPAIR Left 09/2021   athroscopic and open     Allergies  Allergies  Allergen Reactions   Amlodipine Shortness Of Breath and Swelling   Bee Venom Anaphylaxis and Hives   Lisinopril Itching and Cough    Dry cough and severe itching in the throat    Other Other (See Comments)    Mosquito bites cause hives all over body   Aspirin Other (See Comments)    Causes stomach problems   Cefdinir     Other reaction(s): hives   Erythromycin Other (See Comments)    Ate the lining of her stomach   Hctz [Hydrochlorothiazide]     Rash    Penicillins Rash    Has patient had a PCN reaction causing immediate rash, facial/tongue/throat swelling, SOB or lightheadedness with hypotension: Yes Has patient had a PCN reaction causing severe rash involving mucus membranes or skin necrosis: Yes Has patient had a PCN reaction that required hospitalization: No  Has patient had a PCN reaction occurring within the last 10 years: No If all of the above answers are "NO", then may proceed with Cephalosporin use.     History of Present Illness    66 year old female with the above past medical history including CAD, hypertension, hyperlipidemia, GERD, asthma and rheumatoid arthritis.  She has a longstanding history of chest pain. Coronary CTA in 09/2018 showed coronary  calcium score of 259 (94th percentile), moderate CAD in the proximal mid LAD.  FFR showed no significant disease.  ABIs in 01/2020 were normal.  Myoview in 08/2020 for further evaluation of shortness of breath was low risk, no evidence of ischemia.  She was evaluated in the office in 12/2021 and reported occasional left-sided sharp chest pain subtype associated with exertion but overall sporadic.  Symptoms were felt to be stable so no further work-up was recommended at the time.  Shortly after this visit she was evaluated in the ED for similar symptoms.  EKG was unremarkable.  She was noted to have wheezing on exam.  She was felt to have a negative asthma exacerbation was treated with low-dose  prednisone and doxycycline in addition to home inhalers.  Evaluated in the ED again on 04/20/2022 in the setting of shortness of breath, lower extremity edema, weight gain.  She also noted some chest tightness.  Work-up was unremarkable.  She was not felt to be significantly volume overloaded on exam.  She was discharged home in stable condition.  She was last seen in the office on 04/28/2022 and reported ongoing bilateral lower extremity edema dyspnea on exertion.  Repeat echocardiogram showed EF 60 to 65%, normal LV function, no RWMA, mild concentric LVH, G1 DD, normal RV systolic function, no significant valvular abnormalities.  Repeat coronary CTA showed calcium score 331 (92nd percentile), moderate CAD in the proximal LAD, FFR equals 0.92, low likelihood of hemodynamic significance.  Guideline directed medical therapy and aggressive risk factor modification was recommended.   She presents today for follow-up. Since her last visit she has been stable from a cardiac standpoint. She notes ongoing dyspnea, occasional chest discomfort.  She states has taken nitroglycerin on several occasions with some relief.  She notes no significant improvement in her dyspnea with Lasix use.  She did note improvement in her lower extremity edema as well as some weight loss.  She is concerned about her ongoing symptoms and states "I just have a lot going on."  Home Medications    Current Outpatient Medications  Medication Sig Dispense Refill   acetaminophen (TYLENOL) 500 MG tablet Take 325 mg by mouth daily as needed for mild pain, fever or headache.     Budeson-Glycopyrrol-Formoterol (BREZTRI AEROSPHERE) 160-9-4.8 MCG/ACT AERO Inhale two puffs twice daily to prevent cough or wheeze. Rinse mouth after use. 10.7 g 5   diphenhydrAMINE (BENADRYL) 25 MG tablet Take 25 mg by mouth every 6 (six) hours as needed.     EPINEPHrine 0.3 mg/0.3 mL IJ SOAJ injection Inject 0.3 mg into the muscle as needed for anaphylaxis. 2 each 1    fluticasone (FLOVENT HFA) 220 MCG/ACT inhaler Inhale 2 puffs into the lungs 2 (two) times daily. 1 each 5   furosemide (LASIX) 40 MG tablet Take 1 tablet daily as needed for weight gain of 3 lb overnight and 5 lb in 1 week 90 tablet 3   ipratropium-albuterol (DUONEB) 0.5-2.5 (3) MG/3ML SOLN Use 1 vial via nebulizer every 4-6 hours as needed for cough, wheeze, shortness of breath or chest tightness 180 mL 1   isosorbide mononitrate (IMDUR) 30 MG 24 hr tablet Take 0.5 tablets (15 mg total) by mouth daily. 90 tablet 3   levocetirizine (XYZAL) 5 MG tablet Take 1 tablet (5 mg total) by mouth every evening. 90 tablet 1   losartan (COZAAR) 25 MG tablet TAKE 1 TABLET(25 MG) BY MOUTH IN THE MORNING AND AT BEDTIME 180 tablet 3   nitroGLYCERIN (  NITROSTAT) 0.4 MG SL tablet Place 1 tablet (0.4 mg total) under the tongue every 5 (five) minutes as needed for chest pain. 25 tablet 3   nortriptyline (PAMELOR) 10 MG capsule Take 1 capsule (10 mg total) by mouth at bedtime. 30 capsule 5   pantoprazole (PROTONIX) 20 MG tablet Take 20 mg by mouth daily.     potassium chloride SA (KLOR-CON M20) 20 MEQ tablet Take 1 tablet (20 mEq total) by mouth daily. TAKE ONCE DAILY FOR 5 DAYS AND THEN STOP 5 tablet 0   pravastatin (PRAVACHOL) 80 MG tablet Take 1 tablet (80 mg total) by mouth daily. 90 tablet 3   prednisoLONE acetate (PRED FORTE) 1 % ophthalmic suspension      RESTASIS 0.05 % ophthalmic emulsion 1 drop 2 (two) times daily.     Sodium Chloride-Sodium Bicarb 1.57 g PACK Place into the nose.     VENTOLIN HFA 108 (90 Base) MCG/ACT inhaler Inhale 2 puffs into the lungs every 4 (four) hours as needed for wheezing or shortness of breath. 18 g 1   Current Facility-Administered Medications  Medication Dose Route Frequency Provider Last Rate Last Admin   omalizumab Arvid Right) injection 375 mg  375 mg Subcutaneous Q14 Days Kennith Gain, MD   375 mg at 11/24/20 0830     Review of Systems    She denies chest pain,  palpitations, dyspnea, pnd, orthopnea, n, v, dizziness, syncope, edema, weight gain, or early satiety. All other systems reviewed and are otherwise negative except as noted above.   Physical Exam    VS:  BP 128/76 (BP Location: Right Arm, Patient Position: Sitting)   Pulse 71   Ht '4\' 11"'$  (1.499 m)   Wt 183 lb 6.4 oz (83.2 kg)   SpO2 92%   BMI 37.04 kg/m   GEN: Well nourished, well developed, in no acute distress. HEENT: normal. Neck: Supple, no JVD, carotid bruits, or masses. Cardiac: RRR, no murmurs, rubs, or gallops. No clubbing, cyanosis, nonpitting bilateral lower extremity edema.  Radials/DP/PT 2+ and equal bilaterally.  Respiratory:  Respirations regular and unlabored, clear to auscultation bilaterally. GI: Soft, nontender, nondistended, BS + x 4. MS: no deformity or atrophy. Skin: warm and dry, no rash. Neuro:  Strength and sensation are intact. Psych: Normal affect.  Accessory Clinical Findings    ECG personally reviewed by me today -NSR, 71 bpm- no acute changes.   Lab Results  Component Value Date   WBC 8.1 04/20/2022   HGB 12.4 04/20/2022   HCT 40.1 04/20/2022   MCV 85.9 04/20/2022   PLT 209 04/20/2022   Lab Results  Component Value Date   CREATININE 0.88 05/05/2022   BUN 32 (H) 05/05/2022   NA 141 05/05/2022   K 4.4 05/05/2022   CL 102 05/05/2022   CO2 29 05/05/2022   Lab Results  Component Value Date   ALT 28 04/20/2022   AST 29 04/20/2022   ALKPHOS 99 04/20/2022   BILITOT 0.7 04/20/2022   Lab Results  Component Value Date   CHOL 166 03/25/2021   HDL 67 03/25/2021   LDLCALC 80 03/25/2021   LDLDIRECT 135.2 08/23/2013   TRIG 105 03/25/2021   CHOLHDL 2.5 03/25/2021    No results found for: "HGBA1C"  Assessment & Plan    1. CAD: Coronary CTA in 09/2018 showed coronary calcium score of 259 (94th percentile), moderate CAD in the proximal mid LAD.  FFR showed no significant disease. Repeat coronary CTA in 04/2022 showed calcium score 331 (  92nd  percentile), moderate CAD in the proximal LAD, FFR equals 0.92, low likelihood of hemodynamic significance.  She notes intermittent chest discomfort, ongoing dyspnea.  I suspect her symptoms are multifactorial in the setting of severe asthma, CAD.  She did note some relief with nitroglycerin.  Will trial Imdur 15 mg daily.  Discussed ED precautions.  Continue to monitor symptoms.  No ASA in the setting of intolerance.  If symptoms persist, may need to consider further ischemic evaluation.  Will defer for now.  Continue losartan, pravastatin.   2. Dyspnea on exertion/bilateral lower extremity edema: Most recent echo showed EF 60 to 65%, normal LV function, no RWMA, mild concentric LVH, G1 DD, normal RV systolic function, no significant valvular abnormalities.  He notes ongoing dyspnea both at rest and on exertion.  She noted little improvement with Lasix though she did notice an improvement in her swelling as well as some weight loss.  Will provide Lasix 40 mg to use daily as needed for swelling, weight gain.  Recommended compression, elevation.  Recommend follow-up with her asthma specialist, consider pulmonology referral.  3. Hypertension: BP well controlled. Continue current antihypertensive regimen.   4. Hyperlipidemia: LDL was 80 in 03/2021. Will repeat fasting lipids, LFTs.  She is currently taking pravastatin 80 mg daily.  If LDL remains elevated above goal, consider transitioning to Crestor 20 mg daily.   5. Disposition: Follow-up in 3-4 months with Dr. Percival Spanish.      Lenna Sciara, NP 05/24/2022, 8:50 AM

## 2022-05-25 LAB — HEPATIC FUNCTION PANEL
ALT: 27 IU/L (ref 0–32)
AST: 29 IU/L (ref 0–40)
Albumin: 4.1 g/dL (ref 3.9–4.9)
Alkaline Phosphatase: 135 IU/L — ABNORMAL HIGH (ref 44–121)
Bilirubin Total: 0.5 mg/dL (ref 0.0–1.2)
Bilirubin, Direct: 0.13 mg/dL (ref 0.00–0.40)
Total Protein: 7.7 g/dL (ref 6.0–8.5)

## 2022-05-25 LAB — LIPID PANEL
Chol/HDL Ratio: 2.3 ratio (ref 0.0–4.4)
Cholesterol, Total: 178 mg/dL (ref 100–199)
HDL: 79 mg/dL (ref >39)
LDL Chol Calc (NIH): 82 mg/dL (ref 0–99)
Triglycerides: 96 mg/dL (ref 0–149)
VLDL Cholesterol Cal: 17 mg/dL (ref 5–40)

## 2022-06-04 ENCOUNTER — Telehealth: Payer: Self-pay

## 2022-06-04 DIAGNOSIS — E7800 Pure hypercholesterolemia, unspecified: Secondary | ICD-10-CM

## 2022-06-04 DIAGNOSIS — Z79899 Other long term (current) drug therapy: Secondary | ICD-10-CM

## 2022-06-04 NOTE — Telephone Encounter (Signed)
Lmom to discuss lab results and recommendations.

## 2022-06-07 MED ORDER — ROSUVASTATIN CALCIUM 20 MG PO TABS: 20.0000 mg | ORAL_TABLET | Freq: Every day | ORAL | 3 refills | Status: DC

## 2022-06-07 NOTE — Addendum Note (Signed)
Addended by: Derrick Ravel on: 00/09/7046 88:91 PM   Modules accepted: Orders

## 2022-06-07 NOTE — Telephone Encounter (Signed)
Pt returned call. Pt was notified of results and recommendations. Pt will d/c Pravastatin and start Crestor 20 mg daily. Pt will repeat fasting labs in 6-8 weeks.

## 2022-06-08 ENCOUNTER — Ambulatory Visit (INDEPENDENT_AMBULATORY_CARE_PROVIDER_SITE_OTHER): Payer: Medicare Other | Admitting: Neurology

## 2022-06-08 DIAGNOSIS — G5622 Lesion of ulnar nerve, left upper limb: Secondary | ICD-10-CM

## 2022-06-08 DIAGNOSIS — M5417 Radiculopathy, lumbosacral region: Secondary | ICD-10-CM

## 2022-06-08 DIAGNOSIS — R208 Other disturbances of skin sensation: Secondary | ICD-10-CM | POA: Diagnosis not present

## 2022-06-08 NOTE — Procedures (Signed)
[facility]  [address]  [place], [place] [address] Tel: [phone] Fax: [phone] Test Date:  06/08/2022  Patient: Kendra Gilbert DOB: Apr 20, 1956 Physician: Narda Amber, DO  Sex: Female Height: '4\' 11"'$  Ref Phys: Metta Clines, DO  ID#: [id number]   Technician:    History: This is a 66 year old female referred for evaluation of paresthesias of the hands and feet.  NCV & EMG Findings: Extensive electrodiagnostic testing of the left upper and lower extremity shows:  Left median, ulnar, mixed palmar, sural, and superficial peroneal sensory responses are within normal limits. Left median, peroneal, and tibial motor responses are within normal limits.  Left ulnar motor response shows slowed conduction velocity across the elbow (A Elbow-B Elbow, 40 m/s).  There is no evidence of active or chronic motor axonal loss changes affecting any of the tested muscles in the left upper extremity.  In the lower extremities, chronic motor axonal loss changes are seen affecting the L4 myotome bilaterally, without accompanying active denervation.   Impression: Left ulnar neuropathy with slowing across the elbow, demyelinating, mild. Chronic bilateral L4 radiculopathy, mild. There is no evidence of a large fiber sensorimotor polyneuropathy affecting the left side.   ___________________________ Narda Amber, DO    Nerve Conduction Studies   Stim Site NR Peak (ms) Norm Peak (ms) O-P Amp (V) Norm O-P Amp  Left Median Anti Sensory (2nd Digit)  33 C  Wrist    3.6 <3.8 47.2 >10  Left Sup Peroneal Anti Sensory (Ant Lat Mall)  33 C  12 cm    2.2 <4.6 15.1 >3  Left Sural Anti Sensory (Lat Mall)  33 C  Calf    2.9 <4.6 5.8 >3  Left Ulnar Anti Sensory (5th Digit)  33 C  Wrist    2.7 <3.2 33.6 >5     Stim Site NR Onset (ms) Norm Onset (ms) O-P Amp (mV) Norm O-P Amp Site1 Site2 Delta-0 (ms) Dist (cm) Vel (m/s) Norm Vel (m/s)  Left Median Motor (Abd Poll Brev)  33 C   Wrist    3.5 <4.0 7.5 >5 Elbow Wrist 4.3 25.0 58 >50  Elbow    7.8  7.1         Left Peroneal Motor (Ext Dig Brev)  33 C  Ankle    4.2 <6.0 6.0 >2.5 B Fib Ankle 6.0 30.0 50 >40  B Fib    10.2  6.0  Poplt B Fib 1.4 7.0 50 >40  Poplt    11.6  5.9         Left Tibial Motor (Abd Hall Brev)  33 C  Ankle    2.9 <6.0 15.6 >4 Knee Ankle 7.4 33.0 45 >40  Knee    10.3  15.2         Left Ulnar Motor (Abd Dig Minimi)  33 C  Wrist    2.0 <3.1 8.8 >7 B Elbow Wrist 3.1 18.0 58 >50  B Elbow    5.1  8.0  A Elbow B Elbow 2.5 10.0 *40 >50  A Elbow    7.6  7.5            Stim Site NR Peak (ms) Norm Peak (ms) P-T Amp (V) Site1 Site2 Delta-P (ms) Norm Delta (ms)  Left Median/Ulnar Palm Comparison (Wrist - 8cm)  33 C  Median Palm    2.0 <2.2 90.0 Median Palm Ulnar Palm 0.3   Ulnar Palm    1.7 <2.2 21.9  Electromyography   Side Muscle Ins.Act Fibs Fasc Recrt Amp Dur Poly Activation Comment  Right RectFemoris Nml Nml Nml *1- *1+ *1+ *1+ Nml N/A  Right AntTibialis Nml Nml Nml *1- *1+ *1+ *1+ Nml N/A  Left Abd Poll Brev Nml Nml Nml Nml Nml Nml Nml Nml N/A  Left PronatorTeres Nml Nml Nml Nml Nml Nml Nml Nml N/A  Left Triceps Nml Nml Nml Nml Nml Nml Nml Nml N/A  Left Deltoid Nml Nml Nml Nml Nml Nml Nml Nml N/A  Left Biceps Nml Nml Nml Nml Nml Nml Nml Nml N/A  Left 1stDorInt Nml Nml Nml Nml Nml Nml Nml Nml N/A  Left Flex Dig Long Nml Nml Nml Nml Nml Nml Nml Nml N/A  Left RectFemoris Nml Nml Nml *1- *1+ *1+ *1+ Nml N/A  Left GluteusMed Nml Nml Nml Nml Nml Nml Nml Nml N/A  Left FlexCarpiUln Nml Nml Nml Nml Nml Nml Nml Nml N/A  Left AdductorLong Nml Nml Nml Nml Nml Nml Nml Nml N/A  Left AntTibialis Nml Nml Nml *1- *1+ *1+ *1+ Nml N/A  Left Gastroc Nml Nml Nml Nml Nml Nml Nml Nml N/A      Waveforms:

## 2022-06-09 NOTE — Progress Notes (Signed)
Tried calling patient no answer. LMOVM for patient to call the office back.  

## 2022-06-11 ENCOUNTER — Telehealth: Payer: Self-pay | Admitting: Neurology

## 2022-06-11 NOTE — Telephone Encounter (Signed)
Pt called back in returning a call about results

## 2022-06-11 NOTE — Progress Notes (Signed)
LMOVM for patient to call the office back to go over results.

## 2022-06-14 ENCOUNTER — Telehealth: Payer: Self-pay

## 2022-06-14 ENCOUNTER — Telehealth: Payer: Self-pay | Admitting: Neurology

## 2022-06-14 NOTE — Telephone Encounter (Signed)
   Pre-operative Risk Assessment    Patient Name: Kendra Gilbert  DOB: Dec 14, 1955 MRN: [id number]      Request for Surgical Clearance    Procedure:   Colonoscopy  Date of Surgery:  Clearance 07/08/22                                 Surgeon:  Dr. Arta Silence Surgeon's Group or Practice Name:  [facility]  Phone number:  [phone] Fax number:  [phone]   Type of Clearance Requested:   - Medical    Type of Anesthesia:   Propofol   Additional requests/questions:    SignedJacqulynn Cadet   06/14/2022, 5:12 PM

## 2022-06-14 NOTE — Telephone Encounter (Signed)
2 open phone notes closing this one out,

## 2022-06-14 NOTE — Telephone Encounter (Signed)
Patient is calling to get results °

## 2022-06-14 NOTE — Telephone Encounter (Signed)
Left message at [phone] to call office back for results, voicemail

## 2022-06-15 ENCOUNTER — Telehealth: Payer: Self-pay | Admitting: Allergy

## 2022-06-15 MED ORDER — FLUTICASONE PROPIONATE HFA 220 MCG/ACT IN AERO: 2.0000 | INHALATION_SPRAY | Freq: Two times a day (BID) | RESPIRATORY_TRACT | 5 refills | Status: DC

## 2022-06-15 NOTE — Telephone Encounter (Signed)
It looks like symbicort and flvoent BRAND are covered

## 2022-06-15 NOTE — Telephone Encounter (Signed)
Patient stopped by office with letter from her insurance stating effective Jan. 1 2024, Flovent HFA 220 MCG inhaler will no longer be covered. The alternative is arnuity elipta powder for inhalation. Patient states the flovent really works for her and prefers that.   Patient's prescription insurance card has been scanned in.   Please advise   [facility] - [place] [address]

## 2022-06-15 NOTE — Telephone Encounter (Signed)
Flovent sent into pharmacy on file. Noted to the pharmacy that brand name only should be dispensed.

## 2022-06-15 NOTE — Addendum Note (Signed)
Addended by: Eloy End D on: 06/15/2022 05:20 PM   Modules accepted: Orders

## 2022-06-15 NOTE — Telephone Encounter (Signed)
Flovent what strength?

## 2022-06-15 NOTE — Progress Notes (Signed)
[place] message sent to patient to give the office a call to go over results.

## 2022-06-15 NOTE — Telephone Encounter (Signed)
Pt called in stating she has already received her EMG results from our office

## 2022-06-16 NOTE — Telephone Encounter (Signed)
Left message to call back for IN OFFICE appt for pre op clearance.

## 2022-06-16 NOTE — Telephone Encounter (Signed)
   Name: Kendra Gilbert  DOB: Oct 08, 1955  MRN: [id number]  Primary Cardiologist: Minus Breeding, MD  Chart reviewed as part of pre-operative protocol coverage. Because of [name]'s past medical history and time since last visit, she will require a follow-up in-office visit in order to better assess preoperative cardiovascular risk.  Pre-op covering staff: - Please schedule appointment and call patient to inform them. If patient already had an upcoming appointment within acceptable timeframe, please add "pre-op clearance" to the appointment notes so provider is aware. - Please contact requesting surgeon's office via preferred method (i.e, phone, fax) to inform them of need for appointment prior to surgery.  No medications indicated as needing held.   Elgie Collard, PA-C  06/16/2022, 7:55 AM

## 2022-06-16 NOTE — Telephone Encounter (Signed)
Pt would like a callback regarding a to why she needs an in office visit being that she was just here on 11/20. Please advise

## 2022-06-16 NOTE — Telephone Encounter (Signed)
I will forward this back to pre op as the pt was just seen 05/24/22 and pt wants to know why she needs an appt.

## 2022-06-17 NOTE — Telephone Encounter (Signed)
Left message for patient to call back. At the time of her last office visit 05/24/22 she reported dyspnea on exertion and chest discomfort. We need to further risk stratify for upcoming procedure. Attempted to complete by telephone, left message for patient to call back and ask for preop team.  Emmaline Life, NP-C 06/17/2022, 11:11 AM [address] Office [phone] Fax [phone]

## 2022-06-17 NOTE — Telephone Encounter (Signed)
   Primary Cardiologist: Minus Breeding, MD  Chart reviewed as part of pre-operative protocol coverage. Given past medical history and time since last visit, based on ACC/AHA guidelines, Kendra Gilbert would be at acceptable risk for the planned procedure without further cardiovascular testing.   Spoke with patient to complete clearance. Her RCRI for MACE is 0.9%, Class II risk. She is able to complete > 4 METS Activity without concerning symptoms. She has a history of asthma and DOE is exacerbated by hot weather. She is currently walking for exercise with no problems.  Patient was advised that if she develops new symptoms prior to surgery to contact our office to arrange a follow-up appointment. She verbalized understanding.  I will route this recommendation to the requesting party via Epic fax function and remove from pre-op pool.  Please call with questions.  Emmaline Life, NP-C  06/17/2022, 1:24 PM [address], [address] Office [phone] Fax [phone]

## 2022-07-12 ENCOUNTER — Other Ambulatory Visit: Payer: Self-pay

## 2022-07-12 DIAGNOSIS — Z79899 Other long term (current) drug therapy: Secondary | ICD-10-CM

## 2022-07-12 DIAGNOSIS — E7800 Pure hypercholesterolemia, unspecified: Secondary | ICD-10-CM

## 2022-07-12 LAB — LIPID PANEL
Chol/HDL Ratio: 2.5 ratio (ref 0.0–4.4)
Cholesterol, Total: 136 mg/dL (ref 100–199)
HDL: 55 mg/dL (ref >39)
LDL Chol Calc (NIH): 63 mg/dL (ref 0–99)
Triglycerides: 95 mg/dL (ref 0–149)
VLDL Cholesterol Cal: 18 mg/dL (ref 5–40)

## 2022-07-12 LAB — HEPATIC FUNCTION PANEL
ALT: 27 IU/L (ref 0–32)
AST: 28 IU/L (ref 0–40)
Albumin: 3.6 g/dL — ABNORMAL LOW (ref 3.9–4.9)
Alkaline Phosphatase: 129 IU/L — ABNORMAL HIGH (ref 44–121)
Bilirubin Total: 0.3 mg/dL (ref 0.0–1.2)
Bilirubin, Direct: 0.1 mg/dL (ref 0.00–0.40)
Total Protein: 6.9 g/dL (ref 6.0–8.5)

## 2022-07-21 ENCOUNTER — Ambulatory Visit (INDEPENDENT_AMBULATORY_CARE_PROVIDER_SITE_OTHER): Payer: Medicare Other | Admitting: Allergy

## 2022-07-21 ENCOUNTER — Other Ambulatory Visit: Payer: Self-pay

## 2022-07-21 ENCOUNTER — Encounter: Payer: Self-pay | Admitting: Allergy

## 2022-07-21 VITALS — BP 110/68 | HR 89 | Temp 97.9°F | Resp 18

## 2022-07-21 DIAGNOSIS — H1012 Acute atopic conjunctivitis, left eye: Secondary | ICD-10-CM

## 2022-07-21 DIAGNOSIS — J4550 Severe persistent asthma, uncomplicated: Secondary | ICD-10-CM

## 2022-07-21 DIAGNOSIS — R0602 Shortness of breath: Secondary | ICD-10-CM

## 2022-07-21 DIAGNOSIS — J3089 Other allergic rhinitis: Secondary | ICD-10-CM

## 2022-07-21 DIAGNOSIS — J300 Vasomotor rhinitis: Secondary | ICD-10-CM

## 2022-07-21 MED ORDER — AZELASTINE HCL 0.05 % OP SOLN: 1.0000 [drp] | Freq: Two times a day (BID) | OPHTHALMIC | 5 refills | Status: DC | PRN

## 2022-07-21 MED ORDER — ARNUITY ELLIPTA 200 MCG/ACT IN AEPB: 1.0000 | INHALATION_SPRAY | Freq: Every day | RESPIRATORY_TRACT | 5 refills | Status: DC

## 2022-07-21 NOTE — Progress Notes (Signed)
Follow-up Note  RE: Kendra Gilbert MRN: [id number] DOB: Mar 15, 1956 Date of Office Visit: 07/21/2022   History of present illness: Kendra Gilbert is a 67 y.o. female presenting today for follow-up of asthma, allergic rhinitis with conjunctivitis and vasomotor component.  She was last seen in the office on 03/25/22 by myself.    Her previous inhaler (flovent) is no longer covered this year with her insurance.  She used last dose today. Flovent is her secondary inhaler and Judithann Sauger is her primary inhaler.  She uses 2 puffs twice a day of Breztri as well as Flovent.  It appears arnuity is now covered with her insurance.  With this regimen however she still reports some shortness of breath mostly with activity and when she has to go out of the house.  She is not sure if this shortness of breath is due to her asthma or her heart issues.  She states she knows its her lungs if she starts wheezing.  She will use albuterol couple times a week if needing to leave the home.  She does following with cardiology as she has history of CAD, hypertension, hyperlipidemia.  It is likely her heart disease is contributing to her shortness of breath.  She has had issues with edema especially of lower extremity and weight gain.  She had a visit with her cardiology NP Monge on 05/24/22 and her regimen added in imdur and lasix with rec for pulmonology referral.  I think this is a reasonable request.    With pollen season approaching she is curious to see how she will do with her symptoms. She continues on xyzal daily and thus far it still seems to be effective.  She does need more eye drops which she will use as needed for itchy/watery eyes.  Still avoiding nasal medications due to nose still being sensitive.    Review of systems: Review of Systems  Constitutional: Negative.   HENT: Negative.    Eyes: Negative.   Respiratory:  Positive for shortness of breath and wheezing.   Cardiovascular: Negative.    Gastrointestinal: Negative.   Musculoskeletal: Negative.   Skin: Negative.   Allergic/Immunologic: Negative.   Neurological: Negative.      All other systems negative unless noted above in HPI  Past medical/social/surgical/family history have been reviewed and are unchanged unless specifically indicated below.  No changes  Medication List: Current Outpatient Medications  Medication Sig Dispense Refill   acetaminophen (TYLENOL) 500 MG tablet Take 325 mg by mouth daily as needed for mild pain, fever or headache.     ARNUITY ELLIPTA 200 MCG/ACT AEPB Inhale 1 puff into the lungs daily. 30 each 5   azelastine (OPTIVAR) 0.05 % ophthalmic solution Place 1 drop into both eyes 2 (two) times daily as needed. 6 mL 5   Budeson-Glycopyrrol-Formoterol (BREZTRI AEROSPHERE) 160-9-4.8 MCG/ACT AERO Inhale two puffs twice daily to prevent cough or wheeze. Rinse mouth after use. 10.7 g 5   diphenhydrAMINE (BENADRYL) 25 MG tablet Take 25 mg by mouth every 6 (six) hours as needed.     EPINEPHrine 0.3 mg/0.3 mL IJ SOAJ injection Inject 0.3 mg into the muscle as needed for anaphylaxis. 2 each 1   furosemide (LASIX) 40 MG tablet Take 1 tablet daily as needed for weight gain of 3 lb overnight and 5 lb in 1 week 90 tablet 3   ipratropium-albuterol (DUONEB) 0.5-2.5 (3) MG/3ML SOLN Use 1 vial via nebulizer every 4-6 hours as needed for cough, wheeze, shortness of breath  or chest tightness 180 mL 1   isosorbide mononitrate (IMDUR) 30 MG 24 hr tablet Take 0.5 tablets (15 mg total) by mouth daily. 90 tablet 3   levocetirizine (XYZAL) 5 MG tablet Take 1 tablet (5 mg total) by mouth every evening. 90 tablet 1   losartan (COZAAR) 25 MG tablet TAKE 1 TABLET(25 MG) BY MOUTH IN THE MORNING AND AT BEDTIME 180 tablet 3   nitroGLYCERIN (NITROSTAT) 0.4 MG SL tablet Place 1 tablet (0.4 mg total) under the tongue every 5 (five) minutes as needed for chest pain. 25 tablet 3   pantoprazole (PROTONIX) 20 MG tablet Take 20 mg by  mouth daily.     potassium chloride SA (KLOR-CON M20) 20 MEQ tablet Take 1 tablet (20 mEq total) by mouth daily. TAKE ONCE DAILY FOR 5 DAYS AND THEN STOP 5 tablet 0   RESTASIS 0.05 % ophthalmic emulsion 1 drop 2 (two) times daily.     rosuvastatin (CRESTOR) 20 MG tablet Take 1 tablet (20 mg total) by mouth daily. 90 tablet 3   Sodium Chloride-Sodium Bicarb 1.57 g PACK Place into the nose.     VENTOLIN HFA 108 (90 Base) MCG/ACT inhaler Inhale 2 puffs into the lungs every 4 (four) hours as needed for wheezing or shortness of breath. 18 g 1   nortriptyline (PAMELOR) 10 MG capsule Take 1 capsule (10 mg total) by mouth at bedtime. (Patient not taking: Reported on 07/21/2022) 30 capsule 5   prednisoLONE acetate (PRED FORTE) 1 % ophthalmic suspension  (Patient not taking: Reported on 07/21/2022)     Current Facility-Administered Medications  Medication Dose Route Frequency Provider Last Rate Last Admin   omalizumab Arvid Right) injection 375 mg  375 mg Subcutaneous Q14 Days Kennith Gain, MD   375 mg at 11/24/20 0830     Known medication allergies: Allergies  Allergen Reactions   Amlodipine Shortness Of Breath and Swelling   Bee Venom Anaphylaxis and Hives   Lisinopril Itching and Cough    Dry cough and severe itching in the throat    Other Other (See Comments)    Mosquito bites cause hives all over body   Aspirin Other (See Comments)    Causes stomach problems   Cefdinir     Other reaction(s): hives   Erythromycin Other (See Comments)    Ate the lining of her stomach   Hctz [Hydrochlorothiazide]     Rash    Penicillins Rash    Has patient had a PCN reaction causing immediate rash, facial/tongue/throat swelling, SOB or lightheadedness with hypotension: Yes Has patient had a PCN reaction causing severe rash involving mucus membranes or skin necrosis: Yes Has patient had a PCN reaction that required hospitalization: No  Has patient had a PCN reaction occurring within the last 10  years: No If all of the above answers are "NO", then may proceed with Cephalosporin use.      Physical examination: Blood pressure 110/68, pulse 89, temperature 97.9 F (36.6 C), temperature source Temporal, resp. rate 18, SpO2 98 %.  General: Alert, interactive, in no acute distress. HEENT: PERRLA, TMs pearly gray, turbinates non-edematous without discharge, post-pharynx non erythematous. Neck: Supple without lymphadenopathy. Lungs: Clear throughout with very mild wheeze of LUL . {no increased work of breathing. CV: Normal S1, S2 without murmurs. Abdomen: Nondistended, nontender. Skin: Warm and dry, without lesions or rashes. Extremities:  No clubbing, cyanosis or edema. Neuro:   Grossly intact.  Diagnositics/Labs: Labs: Component     Latest Ref Rng 04/20/2022  05/04/2022  WBC     4.0 - 10.5 K/uL 8.1    RBC     3.87 - 5.11 MIL/uL 4.67    Hemoglobin     12.0 - 15.0 g/dL 12.4    HCT     36.0 - 46.0 % 40.1    MCV     80.0 - 100.0 fL 85.9    MCH     26.0 - 34.0 pg 26.6    MCHC     30.0 - 36.0 g/dL 30.9    RDW     11.5 - 15.5 % 13.7    Platelets     150 - 400 K/uL 209    nRBC     0.0 - 0.2 % 0.0    Neutrophils     % 74    NEUT#     1.7 - 7.7 K/uL 6.0    Lymphocytes     % 20    Lymphocyte #     0.7 - 4.0 K/uL 1.6    Monocytes Relative     % 5    Monocyte #     0.1 - 1.0 K/uL 0.4    Eosinophil     % 1    Eosinophils Absolute     0.0 - 0.5 K/uL 0.1    Basophil     % 0    Basophils Absolute     0.0 - 0.1 K/uL 0.0    Immature Granulocytes     % 0    Abs Immature Granulocytes     0.00 - 0.07 K/uL 0.02    Glucose     70 - 99 mg/dL    BUN     8 - 27 mg/dL    Creatinine     0.57 - 1.00 mg/dL    eGFR     >59 mL/min/1.73    BUN/Creatinine Ratio     12 - 28     Sodium     134 - 144 mmol/L    Potassium     3.5 - 5.2 mmol/L    Chloride     96 - 106 mmol/L    CO2     20 - 29 mmol/L    Calcium     8.7 - 10.3 mg/dL    Total Protein     6.0 - 8.5 g/dL     Albumin     3.9 - 4.9 g/dL    Total Bilirubin     0.0 - 1.2 mg/dL    BILIRUBIN, DIRECT     0.00 - 0.40 mg/dL    Alkaline Phosphatase     44 - 121 IU/L    AST     0 - 40 IU/L    ALT     0 - 32 IU/L    Immunofixation Result, Serum  Comment !   IgG (Immunoglobin G), Serum     586 - 1,602 mg/dL  1,652 (H)   IgA/Immunoglobulin A, Serum     87 - 352 mg/dL  569 (H)   IgM (Immunoglobulin M), Srm     26 - 217 mg/dL  196   SSA (Ro) (ENA) Antibody, IgG     <1.0 NEG AI  <1.0 NEG   SSB (La) (ENA) Antibody, IgG     <1.0 NEG AI  <1.0 NEG   Sed Rate     0 - 30 mm/h    CRP     <8.0 mg/L  B Natriuretic Peptide     0.0 - 100.0 pg/mL 70.3    Troponin I (High Sensitivity)     <18 ng/L 3    TSH     0.450 - 4.500 uIU/mL    Angiotensin-Converting Enzyme     9 - 67 U/L  27   Vitamin B12     211 - 911 pg/mL  519    Component     Latest Ref Rng 05/05/2022 07/12/2022  WBC     4.0 - 10.5 K/uL    RBC     3.87 - 5.11 MIL/uL    Hemoglobin     12.0 - 15.0 g/dL    HCT     36.0 - 46.0 %    MCV     80.0 - 100.0 fL    MCH     26.0 - 34.0 pg    MCHC     30.0 - 36.0 g/dL    RDW     11.5 - 15.5 %    Platelets     150 - 400 K/uL    nRBC     0.0 - 0.2 %    Neutrophils     %    NEUT#     1.7 - 7.7 K/uL    Lymphocytes     %    Lymphocyte #     0.7 - 4.0 K/uL    Monocytes Relative     %    Monocyte #     0.1 - 1.0 K/uL    Eosinophil     %    Eosinophils Absolute     0.0 - 0.5 K/uL    Basophil     %    Basophils Absolute     0.0 - 0.1 K/uL    Immature Granulocytes     %    Abs Immature Granulocytes     0.00 - 0.07 K/uL    Glucose     70 - 99 mg/dL 91    BUN     8 - 27 mg/dL 32 (H)    Creatinine     0.57 - 1.00 mg/dL 0.88    eGFR     >59 mL/min/1.73 72    BUN/Creatinine Ratio     12 - 28  36 (H)    Sodium     134 - 144 mmol/L 141    Potassium     3.5 - 5.2 mmol/L 4.4    Chloride     96 - 106 mmol/L 102    CO2     20 - 29 mmol/L 29    Calcium     8.7 - 10.3  mg/dL 9.8    Total Protein     6.0 - 8.5 g/dL  6.9   Albumin     3.9 - 4.9 g/dL  3.6 (L)   Total Bilirubin     0.0 - 1.2 mg/dL  0.3   BILIRUBIN, DIRECT     0.00 - 0.40 mg/dL  0.10   Alkaline Phosphatase     44 - 121 IU/L  129 (H)   AST     0 - 40 IU/L  28   ALT     0 - 32 IU/L  27   Immunofixation Result, Serum    IgG (Immunoglobin G), Serum     586 - 1,602 mg/dL    IgA/Immunoglobulin A, Serum     87 - 352 mg/dL    IgM (  Immunoglobulin M), Srm     26 - 217 mg/dL    SSA (Ro) (ENA) Antibody, IgG     <1.0 NEG AI    SSB (La) (ENA) Antibody, IgG     <1.0 NEG AI    Sed Rate     0 - 30 mm/h    CRP     <8.0 mg/L    B Natriuretic Peptide     0.0 - 100.0 pg/mL    Troponin I (High Sensitivity)     <18 ng/L    TSH     0.450 - 4.500 uIU/mL 2.280    Angiotensin-Converting Enzyme     9 - 67 U/L    Vitamin B12     211 - 911 pg/mL      Assessment and plan: Severe persistent asthma - shortness of breath Allergic rhinitis with conjunctivitis Vasomotor rhinitis   - continue avoidance measures for grass pollens, weed pollens, tree pollens, mold, dust mites - continue Xyzal '5mg'$  daily for now     - use Optivar 1 drop each eye twice a day as needed for itchy/watery eyes.  This appears to be covered with insurance at this time - have access to albuterol inhaler 2 puffs or albuterol 1 vial via nebulizer every 4-6 hours as needed for cough/wheeze/shortness of breath/chest tightness.  May use 15-20 minutes prior to activity.   Monitor frequency of use.    Use inhaler with spacer device.   - continue Breztri 2 puffs twice a day.  - change Flovent to Arnuity due to insurance coverage.   Use Arnuity 267mg 1 puff daily.   - will place pulmonology referral to ensure SOB has no other lung contributor besides asthma history  Asthma control goals:  Full participation in all desired activities (may need albuterol before activity) Albuterol use two time or less a week on average (not counting use  with activity) Cough interfering with sleep two time or less a month Oral steroids no more than once a year No hospitalizations   Follow-up in 4-6 months or sooner if needed  I appreciate the opportunity to take part in Fariha's care. Please do not hesitate to contact me with questions.  Sincerely,   SPrudy Feeler MD Allergy/[facility]

## 2022-07-21 NOTE — Patient Instructions (Addendum)
-  continue avoidance measures for grass pollens, weed pollens, tree pollens, mold, dust mites - continue Xyzal '5mg'$  daily for now     - use Optivar 1 drop each eye twice a day as needed for itchy/watery eyes.  This appears to be covered with insurance at this time - have access to albuterol inhaler 2 puffs or albuterol 1 vial via nebulizer every 4-6 hours as needed for cough/wheeze/shortness of breath/chest tightness.  May use 15-20 minutes prior to activity.   Monitor frequency of use.    Use inhaler with spacer device.   - continue Breztri 2 puffs twice a day.  - change Flovent to Arnuity due to insurance coverage.   Use Arnuity 263mg 1 puff daily.   - will place pulmonology referral  Asthma control goals:  Full participation in all desired activities (may need albuterol before activity) Albuterol use two time or less a week on average (not counting use with activity) Cough interfering with sleep two time or less a month Oral steroids no more than once a year No hospitalizations   Follow-up in 4-6 months or sooner if needed

## 2022-08-11 ENCOUNTER — Ambulatory Visit: Payer: Medicare Other | Admitting: Neurology

## 2022-08-12 ENCOUNTER — Ambulatory Visit (INDEPENDENT_AMBULATORY_CARE_PROVIDER_SITE_OTHER): Payer: Medicare Other | Admitting: Pulmonary Disease

## 2022-08-12 ENCOUNTER — Encounter: Payer: Self-pay | Admitting: Pulmonary Disease

## 2022-08-12 VITALS — BP 124/66 | HR 80 | Ht 59.0 in | Wt 185.0 lb

## 2022-08-12 DIAGNOSIS — J4550 Severe persistent asthma, uncomplicated: Secondary | ICD-10-CM | POA: Diagnosis not present

## 2022-08-12 DIAGNOSIS — R0609 Other forms of dyspnea: Secondary | ICD-10-CM

## 2022-08-12 LAB — CBC WITH DIFFERENTIAL/PLATELET
Basophils Absolute: 0 10*3/uL (ref 0.0–0.1)
Basophils Relative: 0.2 % (ref 0.0–3.0)
Eosinophils Absolute: 0 10*3/uL (ref 0.0–0.7)
Eosinophils Relative: 0.4 % (ref 0.0–5.0)
HCT: 36.1 % (ref 36.0–46.0)
Hemoglobin: 11.9 g/dL — ABNORMAL LOW (ref 12.0–15.0)
Lymphocytes Relative: 16.8 % (ref 12.0–46.0)
Lymphs Abs: 1.5 10*3/uL (ref 0.7–4.0)
MCHC: 33 g/dL (ref 30.0–36.0)
MCV: 83.1 fl (ref 78.0–100.0)
Monocytes Absolute: 0.5 10*3/uL (ref 0.1–1.0)
Monocytes Relative: 5.8 % (ref 3.0–12.0)
Neutro Abs: 6.7 10*3/uL (ref 1.4–7.7)
Neutrophils Relative %: 76.8 % (ref 43.0–77.0)
Platelets: 210 10*3/uL (ref 150.0–400.0)
RBC: 4.34 Mil/uL (ref 3.87–5.11)
RDW: 14.8 % (ref 11.5–15.5)
WBC: 8.7 10*3/uL (ref 4.0–10.5)

## 2022-08-12 NOTE — Progress Notes (Signed)
$'@Patient'g$  ID: Kendra Gilbert, female    DOB: [birth year], 67 y.o.   MRN: [id number]  Chief Complaint  Patient presents with   Consult    Pt is here for consult for asthma. Pt states she thinks she has had asthma for about 3 or more years. Pt is currenlty on Breztri and Arnuity daily and Ventolin as needed. Pt states that the inhalers are helping with her asthma. Pt states she has noticed more that she gets SOB when walking around. Pt states when she works outdoors or whatnot she is having to use the resure inhaler more often. ACT is 12.     Referring provider: Juanita Craver*  HPI:   67 y.o. woman whom we are seeing in evaluation of dyspnea on exertion in the setting of severe severe persistent asthma.  Most recent allergy neonatology note x 2 reviewed.  Patient has ongoing dyspnea on exertion.  Present for years.  May be slightly worsening over time.  She states she is active and walks every day.  However she does not walk as much as she used to.  Less distance than prior.  She is been escalated to [facility] for triple inhaled therapy as well as Arnuity added for additional ICS therapy for asthma.  No real improvement in her breathing.  She thinks the inhalers helped some but not massively.  She does use albuterol as needed with some improvement albeit short-lived.  No time of day when things are better or worse.  No position makes it better worse.  No seasonal or environmental factors she can identify to make things better or worse.  No other alleviating or exacerbating factors.  She recently had a CTA coronary scan 05/2022 that on my review interpretation reveals mosaicism otherwise clear lungs suspicious for small airways disease, noting limitation and not seen upper portions of the lungs.  Most recent chest x-ray 04/2022 reviewed and interpreted as clear lungs bilaterally.  She reports being diagnosed with interstitial lung disease in 2012.  There is no evidence of this on recent  cross-sectional imaging.  It sounds like she was hospitalized at that time with pneumonia, bronchitis, asthma exacerbation.  Suspect this was an acute pneumonitis or pneumonia.  Furthermore, she had a high-res CT scan 2015, I cannot review images but report reveals clear lungs and no evidence of interstitial lung disease.  For further workup of shortness of breath, her echo in 05/2022 did reveal grade 1 diastolic dysfunction but otherwise reassuring results.  She had a perfusion stress test 08/2020 that is normal.  She has had multiple lower extremity Dopplers that showed no DVT.  Notably, her labs have revealed persistently elevated IgE but none tested recently.  She was on Xolair and reports itchiness and MSK pain so this was stopped.  She is not sure it helped her breathing at all.    Questionaires / Pulmonary Flowsheets:   ACT:  Asthma Control Test ACT Total Score  08/12/2022  8:24 AM 12  07/21/2022  9:00 AM 14  03/25/2022  9:00 AM 20    MMRC:     No data to display          Epworth:      No data to display          Tests:   FENO:  No results found for: "NITRICOXIDE"  PFT:     No data to display          WALK:     03/02/2016  8:59 AM  SIX MIN WALK  Supplimental Oxygen during Test? (L/min) No  Tech Comments: brisk pace/no SOB//lmr    Imaging: Personally reviewed and as per EMR and discussion in this note No results found.  Lab Results: Personally reviewed CBC    Component Value Date/Time   WBC 8.1 04/20/2022 0843   RBC 4.67 04/20/2022 0843   HGB 12.4 04/20/2022 0843   HGB 12.8 08/18/2020 1510   HCT 40.1 04/20/2022 0843   HCT 39.1 08/18/2020 1510   PLT 209 04/20/2022 0843   PLT 171 08/18/2020 1510   MCV 85.9 04/20/2022 0843   MCV 85 08/18/2020 1510   MCH 26.6 04/20/2022 0843   MCHC 30.9 04/20/2022 0843   RDW 13.7 04/20/2022 0843   RDW 13.3 08/18/2020 1510   LYMPHSABS 1.6 04/20/2022 0843   LYMPHSABS 1.6 10/17/2019 1134   MONOABS 0.4  04/20/2022 0843   EOSABS 0.1 04/20/2022 0843   EOSABS 0.1 10/17/2019 1134   BASOSABS 0.0 04/20/2022 0843   BASOSABS 0.0 10/17/2019 1134    BMET    Component Value Date/Time   NA 141 05/05/2022 0825   K 4.4 05/05/2022 0825   CL 102 05/05/2022 0825   CO2 29 05/05/2022 0825   GLUCOSE 91 05/05/2022 0825   GLUCOSE 106 (H) 04/20/2022 0843   BUN 32 (H) 05/05/2022 0825   CREATININE 0.88 05/05/2022 0825   CREATININE 0.72 04/15/2022 1214   CALCIUM 9.8 05/05/2022 0825   GFRNONAA >60 04/20/2022 0843   GFRAA 99 08/18/2020 1510    BNP    Component Value Date/Time   BNP 70.3 04/20/2022 0843    ProBNP No results found for: "PROBNP"  Specialty Problems       Pulmonary Problems   Interstitial lung disease ([place])   Wheezing   Sleep apnea   Upper airway cough syndrome    Followed in Pulmonary clinic/ [facility]/ Wert rec hs h2 and h1 08/17/13 > and 01/20/16 > did not take h1 as directed, did not take ppi ac as rec - Allergy profile 03/02/16>  Eos 0.0/  IgE  483  Grass > tree > Ragweed, dog and dust - Sinus CT 03/09/2016 > Negative exam.  No evidence of acute or chronic sinus disease. - trial of singulair 10 mg 04/01/2016       Dyspnea    03/02/2016  Walked RA x 3 laps @ 185 ft each stopped due to  End of study,fast pace, no sob or desat   - Spirometry 03/02/2016  Suboptimal effort on the early portion of f/v but still had nl flows       Multiple pulmonary nodules determined by computed tomography of lung    See CT chest  12/25/15 largest = 3 mm/ never smoker > no f/u rec by Fleischner society guidelines         Sinusitis   Other seasonal allergic rhinitis    Allergies  Allergen Reactions   Amlodipine Shortness Of Breath and Swelling   Bee Venom Anaphylaxis and Hives   Lisinopril Itching and Cough    Dry cough and severe itching in the throat    Other Other (See Comments)    Mosquito bites cause hives all over body   Aspirin Other (See Comments)    Causes stomach  problems   Cefdinir     Other reaction(s): hives   Erythromycin Other (See Comments)    Ate the lining of her stomach   Hctz [Hydrochlorothiazide]     Rash  Penicillins Rash    Has patient had a PCN reaction causing immediate rash, facial/tongue/throat swelling, SOB or lightheadedness with hypotension: Yes Has patient had a PCN reaction causing severe rash involving mucus membranes or skin necrosis: Yes Has patient had a PCN reaction that required hospitalization: No  Has patient had a PCN reaction occurring within the last 10 years: No If all of the above answers are "NO", then may proceed with Cephalosporin use.     Immunization History  Administered Date(s) Administered   Influenza,inj,Quad PF,6+ Mos 05/20/2021   Moderna Sars-Covid-2 Vaccination 10/02/2019, 10/30/2019, 05/05/2020, 10/13/2020    Past Medical History:  Diagnosis Date   Asthma    CAD (coronary artery disease)    Moderate, nonobstructive CAD as seen on coronary CTA in 2020 with normal FFR.   Cataracts, bilateral    Chronic kidney disease    cyst in left kidney   Class 2 obesity    Colon cancer [facility]) 2017   surgical tx  removal of polyp   Concussion 04/20/2021   Coronary artery calcification    a. POET in 2015 with no acute findings.   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   ARNUITY ELLIPTA 200 MCG/ACT AEPB Inhale 1 puff into the lungs daily.   azelastine (OPTIVAR) 0.05 % ophthalmic solution Place 1 drop into both eyes 2 (two) times daily as needed.   Budeson-Glycopyrrol-Formoterol (BREZTRI AEROSPHERE) 160-9-4.8 MCG/ACT AERO Inhale two puffs twice daily to prevent cough or wheeze. Rinse mouth after use.   diphenhydrAMINE (BENADRYL) 25 MG tablet Take 25 mg by mouth every 6 (six) hours as needed.   EPINEPHrine 0.3 mg/0.3 mL IJ SOAJ injection Inject 0.3 mg into the muscle as needed for anaphylaxis.   furosemide (LASIX) 40 MG tablet Take 1 tablet daily as needed for weight gain of 3 lb overnight and 5 lb in 1 week   ipratropium-albuterol (DUONEB) 0.5-2.5 (3) MG/3ML SOLN Use 1 vial via nebulizer every 4-6 hours as needed for cough, wheeze, shortness of breath or chest tightness   isosorbide mononitrate (IMDUR) 30 MG 24 hr tablet Take 0.5 tablets (15 mg total) by mouth daily.   levocetirizine (XYZAL) 5 MG tablet Take 1 tablet (5 mg total) by mouth every evening.   losartan (COZAAR) 25 MG tablet TAKE 1 TABLET(25 MG) BY MOUTH IN THE MORNING AND AT BEDTIME   nortriptyline (PAMELOR) 10 MG capsule Take 1 capsule (10 mg total) by mouth at bedtime.   pantoprazole (PROTONIX) 20 MG tablet Take 20 mg by mouth daily.  potassium chloride SA (KLOR-CON M20) 20 MEQ tablet Take 1 tablet (20 mEq total) by mouth daily. TAKE ONCE DAILY FOR 5 DAYS AND THEN STOP   prednisoLONE acetate (PRED FORTE) 1 % ophthalmic suspension    RESTASIS 0.05 % ophthalmic emulsion 1 drop 2 (two) times daily.   rosuvastatin (CRESTOR) 20 MG tablet Take 1 tablet (20 mg total) by mouth daily.   Sodium Chloride-Sodium Bicarb 1.57 g PACK Place into the nose.   VENTOLIN HFA 108 (90 Base) MCG/ACT inhaler Inhale 2 puffs into the lungs every 4 (four) hours as needed  for wheezing or shortness of breath.   nitroGLYCERIN (NITROSTAT) 0.4 MG SL tablet Place 1 tablet (0.4 mg total) under the tongue every 5 (five) minutes as needed for chest pain.   Facility-Administered Encounter Medications as of 08/12/2022  Medication   omalizumab Arvid Right) injection 375 mg     Review of Systems  Review of Systems  No chest pain with exertion.  No orthopnea or PND.  Comprehensive review of systems otherwise negative. Physical Exam  BP 124/66 (BP Location: Left Arm, Patient Position: Sitting, Cuff Size: Normal)   Pulse 80   Ht '4\' 11"'$  (1.499 m)   Wt 185 lb (83.9 kg)   SpO2 95%   BMI 37.37 kg/m   Wt Readings from Last 5 Encounters:  08/12/22 185 lb (83.9 kg)  05/24/22 183 lb 6.4 oz (83.2 kg)  05/04/22 182 lb (82.6 kg)  04/28/22 186 lb 6.4 oz (84.6 kg)  04/20/22 185 lb (83.9 kg)    BMI Readings from Last 5 Encounters:  08/12/22 37.37 kg/m  05/24/22 37.04 kg/m  05/04/22 36.76 kg/m  04/28/22 37.65 kg/m  04/20/22 37.37 kg/m     Physical Exam General: Sitting in chair, no acute distress Eyes: EOMI, no icterus Neck: Supple, no JVP appreciated, auscultation of trachea reveals no stridor Pulmonary: Clear, normal work of breathing Cardiovascular: Warm, trace to 1+ pitting edema left greater than right Abdomen: Nondistended, sounds present MSK: No synovitis, no joint effusion Neuro: Normal gait, no weakness Psych: Normal mood, full affect  Assessment & Plan:   Severe persistent asthma: On triple inhaled therapy via Breztri and Arnuity for additional ICS therapy.  Still with shortness of breath.  Worse when outdoors.  Some good relief with albuterol.  Suspect difficult to control with inhalers given elevated IgE in the past.  She reports adverse reaction to Xolair including itching, musculoskeletal pain.  I therapy difficult control or at least fully evaluate the reason for symptoms without biologic therapy.  CBC with differential and IgE today.  She would  like to avoid Xolair.  If eosinophils not elevated, consider test prior.  Dyspnea on exertion: Suspect migratory and related to deconditioning, she is active but less active than prior, habitus, poorly controlled asthma.  Chest imaging in the past has been clear.  Low suspicion for additional parenchymal disease or other pulmonary contributors.  TTE in 05/2022 did demonstrate diastolic dysfunction, she has lower extremity swelling so cardiac contributors are certainly possible if not likely.  PFTs for further evaluation.  Lower extremity swelling: Not significant improved with Lasix therapy.  Left greater than right swelling.  Fortunately, lower extremity Doppler 04/2022 with similar findings on exam showed no evidence of DVT.   Return in about 5 weeks (around 09/16/2022).   Lanier Clam, MD 08/12/2022   This appointment required 60 minutes of patient care (this includes precharting, chart review, review of results, face-to-face care, etc.).

## 2022-08-12 NOTE — Patient Instructions (Signed)
Nice to meet you  For additional evaluation of the shortness of breath I recommend pulmonary function test.  This will give [place] the best information to help decide next steps.  Fortunately, your imaging recently is all been clear which is good news.  No changes to inhalers  Blood work today to evaluate if different Biologics and Xolair would help you in the future.  Return to clinic in 5 weeks with pulmonary function test and see me afterwards to discuss results of neck steps

## 2022-08-13 LAB — IGE: IgE (Immunoglobulin E), Serum: 458 kU/L — ABNORMAL HIGH (ref <=114)

## 2022-08-29 NOTE — Progress Notes (Unsigned)
Cardiology Office Note   Date:  08/30/2022   ID:  Kendra Gilbert 1956-01-13, MRN [id number]  PCP:  Marda Stalker, PA-C  Cardiologist:   Minus Breeding, MD   Chief Complaint  Patient presents with   Coronary Artery Disease      History of Present Illness: Kendra Gilbert is a 67 y.o. female who presents for follow up of HTN.  She was intolerant of lisinopril which caused some cough.  We switched her to Cozaar.   She also has nonobstructive coronary disease.  In 2020 she had a CT coronary angiogram that demonstrated some left main calcification.  The LAD had proximal 50 to 69% stenosis.  She was having more shortness of breath.  Her Lexiscan Myoview was negative in Feb 2022.   She had continued symptoms.  Coronary CTA in 04/2022 showed calcium score 331 (92nd percentile), moderate CAD in the proximal LAD, FFR equals 0.92, low likelihood of hemodynamic significance.    She reports that she is continue to get the same pattern of chest discomfort.  It is daily because she does her walking every day.  It happens after 8 to 10 minutes and she will get short of breath.  It goes away when she rests.  It is unchanged from previous.  She is not having any new PND or orthopnea.  She is not having any resting chest pain.  She denies any palpitations, presyncope or syncope.  She has not been having some back pain and she wonders if this is related to taking Lasix so she tries to skip this on certain days.  She does have some leg swelling however.   Past Medical History:  Diagnosis Date   Asthma    CAD (coronary artery disease)    Moderate, nonobstructive CAD as seen on coronary CTA in 2020 with normal FFR.   Cataracts, bilateral    Chronic kidney disease    cyst in left kidney   Class 2 obesity    Colon cancer [facility]) 2017   surgical tx  removal of polyp   Concussion 04/20/2021   Coronary artery calcification    a. POET in 2015 with no acute findings.   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[place]) 0000000   no current issues with   Pneumonia 2016   Rheumatoid arthritis ([place])    oa also, back arthritis, hx rheumatoid     Past Surgical History:  Procedure Laterality Date   ABDOMINAL HYSTERECTOMY     ovaries left   ANKLE SURGERY Left 02/12/2021   COLONOSCOPY WITH PROPOFOL N/A 03/23/2017   Procedure: COLONOSCOPY WITH PROPOFOL;  Surgeon: Arta Silence, MD;  Location: [facility];  Service: Endoscopy;  Laterality: N/A;   colonscopy  2017   DILATION AND CURETTAGE OF UTERUS     few done for endometriosis   GASTROC RECESSION EXTREMITY Left 02/12/2021   Procedure: Left gastroc recession;  Surgeon: Wylene Simmer, MD;  Location: [place];  Service: Orthopedics;  Laterality: Left;   ROTATOR CUFF REPAIR Left 09/2021   athroscopic and  open     Current Outpatient Medications  Medication Sig Dispense Refill   acetaminophen (TYLENOL) 500 MG tablet Take 325 mg by mouth daily as needed for mild pain, fever or headache.     ARNUITY ELLIPTA 200 MCG/ACT AEPB Inhale 1 puff into the lungs daily. 30 each 5   azelastine (OPTIVAR) 0.05 % ophthalmic solution Place 1 drop into both eyes 2 (two) times daily as needed. 6 mL 5   Budeson-Glycopyrrol-Formoterol (BREZTRI AEROSPHERE) 160-9-4.8 MCG/ACT AERO Inhale two puffs twice daily to prevent cough or wheeze. Rinse mouth after use. 10.7 g 5   diphenhydrAMINE (BENADRYL) 25 MG tablet Take 25 mg by mouth every 6 (six) hours as needed.     EPINEPHrine 0.3 mg/0.3 mL IJ SOAJ injection Inject 0.3 mg into the muscle as needed for anaphylaxis. 2 each 1   ipratropium-albuterol (DUONEB) 0.5-2.5 (3) MG/3ML SOLN Use 1 vial via nebulizer  every 4-6 hours as needed for cough, wheeze, shortness of breath or chest tightness 180 mL 1   levocetirizine (XYZAL) 5 MG tablet Take 1 tablet (5 mg total) by mouth every evening. 90 tablet 1   losartan (COZAAR) 25 MG tablet TAKE 1 TABLET(25 MG) BY MOUTH IN THE MORNING AND AT BEDTIME 180 tablet 3   pantoprazole (PROTONIX) 20 MG tablet Take 20 mg by mouth daily.     RESTASIS 0.05 % ophthalmic emulsion 1 drop 2 (two) times daily.     rosuvastatin (CRESTOR) 20 MG tablet Take 1 tablet (20 mg total) by mouth daily. 90 tablet 3   Sodium Chloride-Sodium Bicarb 1.57 g PACK Place into the nose.     VENTOLIN HFA 108 (90 Base) MCG/ACT inhaler Inhale 2 puffs into the lungs every 4 (four) hours as needed for wheezing or shortness of breath. 18 g 1   furosemide (LASIX) 40 MG tablet Take 1 tablet (40 mg total) by mouth daily. Take 1 tablet daily as needed for weight gain of 3 lb overnight and 5 lb in 1 week 90 tablet 3   isosorbide mononitrate (IMDUR) 30 MG 24 hr tablet Take 1 tablet (30 mg total) by mouth daily. 90 tablet 3   nitroGLYCERIN (NITROSTAT) 0.4 MG SL tablet Place 1 tablet (0.4 mg total) under the tongue every 5 (five) minutes as needed for chest pain. (Patient not taking: Reported on 08/30/2022) 25 tablet 3   Current Facility-Administered Medications  Medication Dose Route Frequency Provider Last Rate Last Admin   omalizumab Arvid Right) injection 375 mg  375 mg Subcutaneous Q14 Days Kennith Gain, MD   375 mg at 11/24/20 0830    Allergies:   Amlodipine, Bee venom, Lisinopril, Other, Aspirin, Cefdinir, Erythromycin, Hctz [hydrochlorothiazide], and Penicillins    ROS:  Please see the history of present illness.   Otherwise, review of systems are positive for none.   All other systems are reviewed and negative.    PHYSICAL EXAM: VS:  BP 118/66   Pulse 90   Ht '4\' 11"'$  (1.499 m)   Wt 184 lb 9.6 oz (83.7 kg)   SpO2 99%   BMI 37.28 kg/m  , BMI Body mass index is 37.28 kg/m. GENERAL:   Well appearing NECK:  No jugular venous distention, waveform within normal limits, carotid upstroke brisk and symmetric, no bruits, no thyromegaly LUNGS:  Clear to auscultation bilaterally CHEST:  Scattered wheezing HEART:  PMI not displaced or sustained,S1 and S2 within normal limits, no S3, no S4, no clicks, no rubs, no murmurs ABD:  Flat, positive bowel  sounds normal in frequency in pitch, no bruits, no rebound, no guarding, no midline pulsatile mass, no hepatomegaly, no splenomegaly EXT:  2 plus pulses throughout, no edema, no cyanosis no clubbing   EKG:  EKG is not ordered today. Sinus rhythm, rate 71, axis within normal limits, intervals within normal limits, no acute ST-T wave changes.  05/24/22  Recent Labs: 04/20/2022: B Natriuretic Peptide 70.3 05/05/2022: BUN 32; Creatinine, Ser 0.88; Potassium 4.4; Sodium 141; TSH 2.280 07/12/2022: ALT 27 08/12/2022: Hemoglobin 11.9; Platelets 210.0    Lipid Panel    Component Value Date/Time   CHOL 136 07/12/2022 0818   TRIG 95 07/12/2022 0818   HDL 55 07/12/2022 0818   CHOLHDL 2.5 07/12/2022 0818   CHOLHDL 3.3 02/06/2016 0930   VLDL 19 02/06/2016 0930   LDLCALC 63 07/12/2022 0818   LDLDIRECT 135.2 08/23/2013 1142      Wt Readings from Last 3 Encounters:  08/30/22 184 lb 9.6 oz (83.7 kg)  08/12/22 185 lb (83.9 kg)  05/24/22 183 lb 6.4 oz (83.2 kg)      Other studies Reviewed: Additional studies/ records that were reviewed today include: Labs Review of the above records demonstrates:  See elsewhere   ASSESSMENT AND PLAN:  Chest pain:    She has non obstructive CAD.  She has had both a perfusion study and a CT with FFR.  Her pain pattern is unchanged.  I am going to assume that some exertional angina and increase her Imdur to 30 mg.  She continues to have this we can go up to 60.  She has been sensitive to medications.  Of note she does not tolerate aspirin.  Leg swelling: She has not wanted to take 40 mg of Lasix daily but she  has been skipping doses.  I will change her to 20 mg daily and see if she does better with this.  HTN: Her blood pressure is at target.  No change in therapy.   Dyslipidemia: LDL was 63 which is much better than previous.  She will continue the meds as listed.     Current medicines are reviewed at length with the patient today.  The patient does not have concerns regarding medicines.  The following changes have been made:As above  Labs/ tests ordered today include: None  No orders of the defined types were placed in this encounter.   Disposition:   FU with me in 12 months.    Signed, Minus Breeding, MD  08/30/2022 8:08 AM    [place]

## 2022-08-30 ENCOUNTER — Ambulatory Visit: Payer: Medicare Other | Attending: Cardiology | Admitting: Cardiology

## 2022-08-30 ENCOUNTER — Encounter: Payer: Self-pay | Admitting: Cardiology

## 2022-08-30 VITALS — BP 118/66 | HR 90 | Ht 59.0 in | Wt 184.6 lb

## 2022-08-30 DIAGNOSIS — E785 Hyperlipidemia, unspecified: Secondary | ICD-10-CM

## 2022-08-30 DIAGNOSIS — M7989 Other specified soft tissue disorders: Secondary | ICD-10-CM

## 2022-08-30 DIAGNOSIS — I10 Essential (primary) hypertension: Secondary | ICD-10-CM

## 2022-08-30 DIAGNOSIS — R072 Precordial pain: Secondary | ICD-10-CM | POA: Diagnosis present

## 2022-08-30 MED ORDER — ISOSORBIDE MONONITRATE ER 30 MG PO TB24: 30.0000 mg | ORAL_TABLET | Freq: Every day | ORAL | 3 refills | Status: DC

## 2022-08-30 MED ORDER — FUROSEMIDE 40 MG PO TABS: 40.0000 mg | ORAL_TABLET | Freq: Every day | ORAL | 3 refills | Status: DC

## 2022-08-30 NOTE — Patient Instructions (Signed)
Medication Instructions:   CHANGE FUROSEMIDE TO 40 MG ONCE DAILY  INCREASE ISOSORBIDE TO 30 MG ONCE DAILY=WHOLE TABLET ONCE DAILY  *If you need a refill on your cardiac medications before your next appointment, please call your pharmacy*   Follow-Up: At [facility], you and your health needs are our priority.  As part of our continuing mission to provide you with exceptional heart care, we have created designated Provider Care Teams.  These Care Teams include your primary Cardiologist (physician) and Advanced Practice Providers (APPs -  Physician Assistants and Nurse Practitioners) who all work together to provide you with the care you need, when you need it.  We recommend signing up for the patient portal called "MyChart".  Sign up information is provided on this After Visit Summary.  MyChart is used to connect with patients for Virtual Visits (Telemedicine).  Patients are able to view lab/test results, encounter notes, upcoming appointments, etc.  Non-urgent messages can be sent to your provider as well.   To learn more about what you can do with MyChart, go to [email or web address].    Your next appointment:   12 month(s)  Provider:   Minus Breeding, MD

## 2022-09-02 ENCOUNTER — Other Ambulatory Visit: Payer: Self-pay | Admitting: Physician Assistant

## 2022-09-02 DIAGNOSIS — K760 Fatty (change of) liver, not elsewhere classified: Secondary | ICD-10-CM

## 2022-09-02 DIAGNOSIS — R7989 Other specified abnormal findings of blood chemistry: Secondary | ICD-10-CM

## 2022-09-13 ENCOUNTER — Ambulatory Visit
Admission: RE | Admit: 2022-09-13 | Discharge: 2022-09-13 | Disposition: A | Discharge status: Home / Self Care | Payer: Medicare Other | Source: Ambulatory Visit | Attending: Physician Assistant | Admitting: Physician Assistant

## 2022-09-13 DIAGNOSIS — K760 Fatty (change of) liver, not elsewhere classified: Secondary | ICD-10-CM

## 2022-09-13 DIAGNOSIS — R7989 Other specified abnormal findings of blood chemistry: Secondary | ICD-10-CM

## 2022-09-15 ENCOUNTER — Encounter: Payer: Self-pay | Admitting: Pulmonary Disease

## 2022-09-15 ENCOUNTER — Ambulatory Visit (INDEPENDENT_AMBULATORY_CARE_PROVIDER_SITE_OTHER): Payer: Medicare Other | Admitting: Pulmonary Disease

## 2022-09-15 VITALS — BP 118/68 | HR 74 | Wt 186.0 lb

## 2022-09-15 DIAGNOSIS — R0609 Other forms of dyspnea: Secondary | ICD-10-CM

## 2022-09-15 DIAGNOSIS — R918 Other nonspecific abnormal finding of lung field: Secondary | ICD-10-CM | POA: Diagnosis not present

## 2022-09-15 DIAGNOSIS — J4550 Severe persistent asthma, uncomplicated: Secondary | ICD-10-CM

## 2022-09-15 LAB — PULMONARY FUNCTION TEST
DL/VA % pred: 106 %
DL/VA: 4.59 ml/min/mmHg/L
DLCO cor % pred: 70 %
DLCO cor: 11.6 ml/min/mmHg
DLCO unc % pred: 66 %
DLCO unc: 11.03 ml/min/mmHg
FEF 25-75 Pre: 1.53 L/sec
FEF2575-%Pred-Pre: 84 %
FEV1-%Pred-Pre: 72 %
FEV1-Pre: 1.39 L
FEV1FVC-%Pred-Pre: 121 %
FEV6-%Pred-Pre: 61 %
FEV6-Pre: 1.49 L
FEV6FVC-%Pred-Pre: 104 %
FVC-%Pred-Pre: 58 %
FVC-Pre: 1.49 L
Pre FEV1/FVC ratio: 93 %
Pre FEV6/FVC Ratio: 100 %
RV % pred: 110 %
RV: 2.06 L
TLC % pred: 90 %
TLC: 3.88 L

## 2022-09-15 NOTE — Progress Notes (Signed)
Pre Arlyce Harman, DLCO and Pleth performed today.

## 2022-09-15 NOTE — Patient Instructions (Signed)
Pre Arlyce Harman, DLCO and Pleth performed today.

## 2022-09-15 NOTE — Patient Instructions (Signed)
No changes to medications  Consider Tezspire injections in future if things get worse  Return to clinic in 3 months or sooner as needed with Dr. Silas Flood

## 2022-09-15 NOTE — Progress Notes (Signed)
$'@Patient'n$  ID: Kendra Gilbert, female    DOB: Nov 27, 1955, 67 y.o.   MRN: [id number]  Chief Complaint  Patient presents with   Follow-up    Pt is here for follow up for DOE> Pt had full pfts done this morning.     Referring provider: Marda Stalker, PA-C  HPI:   67 y.o. woman whom we are seeing in evaluation of dyspnea on exertion in the setting of severe severe persistent asthma.  Most recent cardiology note reviewed.  Seen here last time with ongoing shortness of breath.  Most likely related to asthma in my opinion.  No change in medications were made.  We did send labs for phenotyping, eosinophils not elevated, IgE 460 but lower than prior.  She has adverse reaction to Xolair no longer on it.  Not really an option given her side effects.  We did order PFTs for further evaluation of her shortness of breath.  This demonstrated mildly reduced FVC but otherwise no obstruction, normal lung volumes, DLCO mildly reduced but likely underestimated given much lower volume used in TLC, suspect DLCO within normal limits.  Full interpretation below.  Discussed that this in conjunction with her recent imaging is encouraging.  Do not think you are missing anything else from a lung standpoint.  Most likely asthma only thing contributed from the lungs.  HPI at initial visit: Patient has ongoing dyspnea on exertion.  Present for years.  May be slightly worsening over time.  She states she is active and walks every day.  However she does not walk as much as she used to.  Less distance than prior.  She is been escalated to [facility] for triple inhaled therapy as well as Arnuity added for additional ICS therapy for asthma.  No real improvement in her breathing.  She thinks the inhalers helped some but not massively.  She does use albuterol as needed with some improvement albeit short-lived.  No time of day when things are better or worse.  No position makes it better worse.  No seasonal or environmental factors  she can identify to make things better or worse.  No other alleviating or exacerbating factors.  She recently had a CTA coronary scan 05/2022 that on my review interpretation reveals mosaicism otherwise clear lungs suspicious for small airways disease, noting limitation and not seen upper portions of the lungs.  Most recent chest x-ray 04/2022 reviewed and interpreted as clear lungs bilaterally.  She reports being diagnosed with interstitial lung disease in 2012.  There is no evidence of this on recent cross-sectional imaging.  It sounds like she was hospitalized at that time with pneumonia, bronchitis, asthma exacerbation.  Suspect this was an acute pneumonitis or pneumonia.  Furthermore, she had a high-res CT scan 2015, I cannot review images but report reveals clear lungs and no evidence of interstitial lung disease.  For further workup of shortness of breath, her echo in 05/2022 did reveal grade 1 diastolic dysfunction but otherwise reassuring results.  She had a perfusion stress test 08/2020 that is normal.  She has had multiple lower extremity Dopplers that showed no DVT.  Notably, her labs have revealed persistently elevated IgE but none tested recently.  She was on Xolair and reports itchiness and MSK pain so this was stopped.  She is not sure it helped her breathing at all.    Questionaires / Pulmonary Flowsheets:   ACT:  Asthma Control Test ACT Total Score  08/12/2022  8:24 AM 12  07/21/2022  9:00  AM 14  03/25/2022  9:00 AM 20    MMRC:     No data to display           Epworth:      No data to display           Tests:   FENO:  No results found for: "NITRICOXIDE"  PFT:    Latest Ref Rng & Units 09/15/2022    9:52 AM  PFT Results  FVC-Pre L 1.49  P  FVC-Predicted Pre % 58  P  Pre FEV1/FVC % % 93  P  FEV1-Pre L 1.39  P  FEV1-Predicted Pre % 72  P  DLCO uncorrected ml/min/mmHg 11.03  P  DLCO UNC% % 66  P  DLCO corrected ml/min/mmHg 11.60  P  DLCO COR %Predicted  % 70  P  DLVA Predicted % 106  P  TLC L 3.88  P  TLC % Predicted % 90  P  RV % Predicted % 110  P    P Preliminary result   Reviewed interpreted spirometry just of moderate restriction versus air trapping.  Bronchodilators not administered.  TLC within normal limits, air trapping present consistent with small airways disease, DLCO is mildly reduced but likely underestimated given lung volume used is less than 75% of TLC reported and lung volumes.  WALK:     03/02/2016    8:59 AM  SIX MIN WALK  Supplimental Oxygen during Test? (L/min) No  Tech Comments: brisk pace/no SOB//lmr    Imaging: Personally reviewed and as per EMR and discussion in this note US Abdomen Limited RUQ (LIVER/GB)  Result Date: 09/13/2022 CLINICAL DATA:  LE it LFTs.  Fatty liver. EXAM: ULTRASOUND ABDOMEN LIMITED RIGHT UPPER QUADRANT COMPARISON:  None Available. FINDINGS: Gallbladder: No gallstones or wall thickening visualized. No sonographic Murphy sign noted by sonographer. Common bile duct: Diameter: 2.2 mm Liver: Increased echogenicity. No focal mass. Portal vein is patent on color Doppler imaging with normal direction of blood flow towards the liver. Other: None. IMPRESSION: 1. Diffuse increased echogenicity throughout the liver is nonspecific and may be seen with hepatic steatosis or other underlying intrinsic liver disease. 2. No other abnormalities. Electronically Signed   By: Dorise Bullion III M.D.   On: 09/13/2022 11:30    Lab Results: Personally reviewed CBC    Component Value Date/Time   WBC 8.7 08/12/2022 0855   RBC 4.34 08/12/2022 0855   HGB 11.9 (L) 08/12/2022 0855   HGB 12.8 08/18/2020 1510   HCT 36.1 08/12/2022 0855   HCT 39.1 08/18/2020 1510   PLT 210.0 08/12/2022 0855   PLT 171 08/18/2020 1510   MCV 83.1 08/12/2022 0855   MCV 85 08/18/2020 1510   MCH 26.6 04/20/2022 0843   MCHC 33.0 08/12/2022 0855   RDW 14.8 08/12/2022 0855   RDW 13.3 08/18/2020 1510   LYMPHSABS 1.5 08/12/2022 0855    LYMPHSABS 1.6 10/17/2019 1134   MONOABS 0.5 08/12/2022 0855   EOSABS 0.0 08/12/2022 0855   EOSABS 0.1 10/17/2019 1134   BASOSABS 0.0 08/12/2022 0855   BASOSABS 0.0 10/17/2019 1134    BMET    Component Value Date/Time   NA 141 05/05/2022 0825   K 4.4 05/05/2022 0825   CL 102 05/05/2022 0825   CO2 29 05/05/2022 0825   GLUCOSE 91 05/05/2022 0825   GLUCOSE 106 (H) 04/20/2022 0843   BUN 32 (H) 05/05/2022 0825   CREATININE 0.88 05/05/2022 0825   CREATININE 0.72 04/15/2022 1214   CALCIUM  9.8 05/05/2022 0825   GFRNONAA >60 04/20/2022 0843   GFRAA 99 08/18/2020 1510    BNP    Component Value Date/Time   BNP 70.3 04/20/2022 0843    ProBNP No results found for: "PROBNP"  Specialty Problems       Pulmonary Problems   Wheezing   Sleep apnea   Upper airway cough syndrome    Followed in Pulmonary clinic/ [facility]/ Wert rec hs h2 and h1 08/17/13 > and 01/20/16 > did not take h1 as directed, did not take ppi ac as rec - Allergy profile 03/02/16>  Eos 0.0/  IgE  483  Grass > tree > Ragweed, dog and dust - Sinus CT 03/09/2016 > Negative exam.  No evidence of acute or chronic sinus disease. - trial of singulair 10 mg 04/01/2016       Dyspnea    03/02/2016  Walked RA x 3 laps @ 185 ft each stopped due to  End of study,fast pace, no sob or desat   - Spirometry 03/02/2016  Suboptimal effort on the early portion of f/v but still had nl flows       Multiple pulmonary nodules determined by computed tomography of lung    See CT chest  12/25/15 largest = 3 mm/ never smoker > no f/u rec by Fleischner society guidelines         Sinusitis   Other seasonal allergic rhinitis    Allergies  Allergen Reactions   Amlodipine Shortness Of Breath and Swelling   Bee Venom Anaphylaxis and Hives   Lisinopril Itching and Cough    Dry cough and severe itching in the throat    Other Other (See Comments)    Mosquito bites cause hives all over body   Aspirin Other (See Comments)    Causes  stomach problems   Cefdinir     Other reaction(s): hives   Erythromycin Other (See Comments)    Ate the lining of her stomach   Hctz [Hydrochlorothiazide]     Rash    Penicillins Rash    Has patient had a PCN reaction causing immediate rash, facial/tongue/throat swelling, SOB or lightheadedness with hypotension: Yes Has patient had a PCN reaction causing severe rash involving mucus membranes or skin necrosis: Yes Has patient had a PCN reaction that required hospitalization: No  Has patient had a PCN reaction occurring within the last 10 years: No If all of the above answers are "NO", then may proceed with Cephalosporin use.     Immunization History  Administered Date(s) Administered   Influenza,inj,Quad PF,6+ Mos 05/20/2021   Moderna Sars-Covid-2 Vaccination 10/02/2019, 10/30/2019, 05/05/2020, 10/13/2020    Past Medical History:  Diagnosis Date   Asthma    CAD (coronary artery disease)    Moderate, nonobstructive CAD as seen on coronary CTA in 2020 with normal FFR.   Cataracts, bilateral    Chronic kidney disease    cyst in left kidney   Class 2 obesity    Colon cancer [facility]) 2017   surgical tx  removal of polyp   Concussion 04/20/2021   Coronary artery calcification    a. POET in 2015 with no acute findings.   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   ARNUITY ELLIPTA 200 MCG/ACT AEPB Inhale 1 puff into the lungs daily.   azelastine (OPTIVAR) 0.05 % ophthalmic solution Place 1 drop into both eyes 2 (two) times daily as needed.   Budeson-Glycopyrrol-Formoterol (BREZTRI AEROSPHERE) 160-9-4.8 MCG/ACT AERO Inhale two puffs twice daily to prevent cough or wheeze. Rinse mouth after use.   diphenhydrAMINE (BENADRYL) 25 MG tablet Take 25 mg by mouth every 6 (six) hours as needed.   EPINEPHrine 0.3 mg/0.3 mL IJ SOAJ injection Inject 0.3 mg into the muscle as needed for anaphylaxis.   furosemide (LASIX) 40 MG tablet Take 1 tablet (40 mg total) by mouth daily. Take 1 tablet daily as needed for weight gain of 3 lb overnight and 5 lb in 1 week   ipratropium-albuterol (DUONEB) 0.5-2.5 (3) MG/3ML SOLN Use 1 vial via nebulizer every 4-6 hours as needed for cough, wheeze, shortness of breath or chest tightness   isosorbide mononitrate (IMDUR) 30 MG 24 hr tablet Take 1 tablet (30 mg total) by mouth daily.   levocetirizine (XYZAL) 5 MG tablet Take 1 tablet (5 mg total) by mouth every evening.   losartan (COZAAR) 25 MG tablet TAKE 1 TABLET(25 MG) BY MOUTH IN THE MORNING AND AT BEDTIME   pantoprazole (PROTONIX) 20 MG tablet Take 20 mg by mouth daily.   RESTASIS 0.05 % ophthalmic emulsion 1 drop 2 (two) times daily.   rosuvastatin (CRESTOR) 20 MG tablet Take 1 tablet (20 mg total) by mouth daily.   Sodium Chloride-Sodium Bicarb 1.57 g PACK Place into the nose.   VENTOLIN HFA 108 (90 Base) MCG/ACT inhaler Inhale 2 puffs into the lungs every 4 (four) hours as needed for wheezing or shortness of breath.   nitroGLYCERIN (NITROSTAT) 0.4 MG SL tablet Place 1 tablet (0.4 mg total) under the tongue every 5 (five) minutes as needed for chest pain. (Patient not taking: Reported on 08/30/2022)   [DISCONTINUED]  omalizumab Arvid Right) injection 375 mg    No facility-administered encounter medications on file as of 09/15/2022.     Review of Systems  Review of Systems  N/a Physical Exam  BP 118/68 (BP Location: Left Arm, Patient Position: Sitting, Cuff Size: Normal)   Pulse 74   Wt 186 lb (84.4 kg)   SpO2 98%   BMI 37.57 kg/m   Wt Readings from Last 5 Encounters:  09/15/22 186 lb (84.4 kg)  08/30/22 184 lb 9.6 oz (83.7 kg)  08/12/22 185 lb (83.9 kg)  05/24/22 183 lb 6.4 oz (83.2 kg)  05/04/22 182 lb (82.6 kg)    BMI Readings from Last 5 Encounters:  09/15/22 37.57 kg/m  08/30/22 37.28 kg/m  08/12/22 37.37 kg/m  05/24/22 37.04 kg/m  05/04/22 36.76 kg/m     Physical Exam General: Sitting in chair, no acute distress Eyes: EOMI, no icterus Neck: Supple, no JVP appreciated,  auscultation of trachea reveals no stridor Pulmonary: Clear, normal work of breathing Cardiovascular: Warm, trace to 1+ pitting edema left greater than right Abdomen: Nondistended, sounds present MSK: No synovitis, no joint effusion Neuro: Normal gait, no weakness Psych: Normal mood, full affect  Assessment & Plan:   Severe persistent asthma: On triple inhaled therapy via Breztri and Arnuity for additional ICS therapy.  Still with shortness of breath.  Worse when outdoors.  Some good relief with albuterol.  Suspect difficult to control with inhalers given elevated IgE in the past.  She reports adverse reaction to Xolair including itching, musculoskeletal pain.  Eosinophils not elevated.  Recommend using test prior given poorly controlled disease.  She declines at this time not wanting to add additional medications.  Dyspnea on exertion: Suspect multifactorial and related to deconditioning, she is active but less active than prior, habitus, poorly controlled asthma.  Chest imaging in the past has been clear.  Low suspicion for additional parenchymal disease or other pulmonary contributors.  TTE in 05/2022 did  demonstrate diastolic dysfunction, she has lower extremity swelling so cardiac contributors are certainly possible if not likely.  PFTs largely normal which is reassuring.  No other contributors from the lungs that can be identified.  Lung nodules: Repeat CT 09/2022.  Stable to resolved.  No further imaging required.   Return in about 3 months (around 12/16/2022).   Lanier Clam, MD 09/15/2022

## 2022-09-19 ENCOUNTER — Other Ambulatory Visit: Payer: Self-pay | Admitting: Allergy

## 2022-09-26 ENCOUNTER — Other Ambulatory Visit: Payer: Self-pay | Admitting: Allergy

## 2022-09-30 NOTE — Progress Notes (Signed)
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  Consider gabapentin. Refer to ortho for consideration of ulnar nerve surgery Limit use of pain relievers to no more than 2 days out of week to prevent risk of rebound or medication-overuse headache. Follow up 6 months.       Subjective:  Kendra Gilbert is a 67 year old right-handed female with CAD, HTN, HLD, Osteoarthritis (had RA from childhood to mid-60s), asthma, bilateral cataracts, non-alcoholic cirrhosis and history of colon cancer and childhood rheumatic fever who follows up for headache and dysesthesias.    UPDATE: For treatment of both headache and dysesthesias, she was started on nortriptyline but she stopped because a pharmacist told her she probably shouldn't be taking it based on her other medications.  She underwent neuropathy workup.  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  NCV-EMG on 06/08/2022 revealed mild left ulnar neuropathy with slowing across the elbow and chronic mild bilateral L4 radiculopathy but no large fiber sensorimotor polyneuropathy  Headaches still daily but not often severe anymore as long as she stops the trigger (stop reading, lower light, stop screen time).  Tries to avoid pain relievers.  May take one Tylenol once in awhile.    It is the left ulnar neuropathy that seems to be most problematic.  She has pain and numbness radiating up the arm along the ulnar distribution.  Elbow is tender to touch and when leaning just a little bit.  Always tries to keep arm straight, which is difficult at times.      HISTORY: In October 2022, she fell and hit her forehead and face on concrete after tripping outside.  She sustained forehead hematoma..  She was seen in the ED where CT head and maxillofacial personally reviewed were negative for acute fractures or intracranial abnormalities.  Since then, she started having headaches.  They are left sided (rarely right sided) pressure in the eye and left side of head, a dull pressure that gradually becomes severe later in the day.  Associated photosensitivity.  No nausea, vomiting, phonophobia, visual disturbance, or unilateral numbness or weakness.  It is persistent.  Has some neck pain but subsequently underwent left shoulder surgery.  Reading and working on computer aggravates it.  Does not treat with analgesics.  She was treated at [facility] for concussion which helped.  She underwent vestibular rehab.  She was experiencing rhinorrhea and has a history of chronic sinusitis, so it was suspected that her ongoing headaches were related to allergies and sinusitis.  She saw ENT.  CT sinuses on 08/27/2021 were normal.  Eye exams have been unremarkable except for dry eye for which she received drops.     In addition, over the past year she notes burning in her feet and hands.  It is also persistent.  Involves the left foot and calf and lateral right foot.  Involves the fingers in the hand.  Sometimes her fingers or toes will curl and get stuck.  She had rheumatoid arthritis since childhood but was told by rheumatology that it changed to osteoarthritis a couple of years ago.  No longer on Plaquenil.  Sed rate and CRP this month were 104 and 11.3 respectively.  Autoimmune labs from June 2022 include sed rate 89, negative RF, negative CCP antibody, ds-DNA antibody 4, negative RNP antibody.  She has neck and back pain.  Sometimes she feels pain down the legs as well.  She did injure her left ankle in the fall.    PAST MEDICAL HISTORY: Past Medical History:  Diagnosis Date    Asthma    CAD (coronary artery disease)    Moderate, nonobstructive CAD as seen on coronary CTA in 2020 with normal FFR.   Cataracts, bilateral    Chronic kidney disease    cyst in left kidney   Class 2 obesity    Colon cancer [facility]) 2017   surgical tx  removal of polyp   Concussion 04/20/2021   Coronary artery calcification    a. POET in 2015 with no acute findings.   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     ARNUITY ELLIPTA 200 MCG/ACT AEPB Inhale 1 puff into the lungs daily. 30 each 5   azelastine (OPTIVAR) 0.05 % ophthalmic solution Place 1 drop into both eyes 2 (two) times daily as needed. 6 mL 5   Budeson-Glycopyrrol-Formoterol (BREZTRI AEROSPHERE) 160-9-4.8 MCG/ACT AERO INHALE 2 PUFFS TWICE DAILY TO PREVENT COUGH OR WHEEZE, RINSE MOUTH AFTER USE. 10.7 g 5   diphenhydrAMINE (BENADRYL) 25 MG tablet Take 25 mg by mouth every 6 (six) hours as needed.     EPINEPHrine 0.3 mg/0.3 mL IJ SOAJ injection Inject 0.3 mg into the muscle as needed for anaphylaxis. 2 each 1   furosemide (LASIX) 40 MG tablet Take 1 tablet (40 mg total) by mouth daily. Take 1 tablet daily as needed for weight gain of 3 lb overnight and 5 lb in 1 week 90 tablet 3   ipratropium-albuterol (DUONEB) 0.5-2.5 (3) MG/3ML SOLN Use 1 vial via nebulizer every 4-6 hours as needed for cough,  wheeze, shortness of breath or chest tightness 180 mL 1   isosorbide mononitrate (IMDUR) 30 MG 24 hr tablet Take 1 tablet (30 mg total) by mouth daily. 90 tablet 3   levocetirizine (XYZAL) 5 MG tablet TAKE 1 TABLET(5 MG) BY MOUTH EVERY EVENING 90 tablet 1   losartan (COZAAR) 25 MG tablet TAKE 1 TABLET(25 MG) BY MOUTH IN THE MORNING AND AT BEDTIME 180 tablet 3   nitroGLYCERIN (NITROSTAT) 0.4 MG SL tablet Place 1 tablet (0.4 mg total) under the tongue every 5 (five) minutes as needed for chest pain. (Patient not taking: Reported on 08/30/2022) 25 tablet 3   pantoprazole (PROTONIX) 20 MG tablet Take 20 mg by mouth daily.     RESTASIS 0.05 % ophthalmic emulsion 1 drop 2 (  two) times daily.     rosuvastatin (CRESTOR) 20 MG tablet Take 1 tablet (20 mg total) by mouth daily. 90 tablet 3   Sodium Chloride-Sodium Bicarb 1.57 g PACK Place into the nose.     VENTOLIN HFA 108 (90 Base) MCG/ACT inhaler Inhale 2 puffs into the lungs every 4 (four) hours as needed for wheezing or shortness of breath. 18 g 1   No current facility-administered medications on file prior to visit.    ALLERGIES: Allergies  Allergen Reactions   Amlodipine Shortness Of Breath and Swelling   Bee Venom Anaphylaxis and Hives   Lisinopril Itching and Cough    Dry cough and severe itching in the throat    Other Other (See Comments)    Mosquito bites cause hives all over body   Aspirin Other (See Comments)    Causes stomach problems   Cefdinir     Other reaction(s): hives   Erythromycin Other (See Comments)    Ate the lining of her stomach   Hctz [Hydrochlorothiazide]     Rash    Penicillins Rash    Has patient had a PCN reaction causing immediate rash, facial/tongue/throat swelling, SOB or lightheadedness with hypotension: Yes Has patient had a PCN reaction causing severe rash involving mucus membranes or skin necrosis: Yes Has patient had a PCN reaction that required hospitalization: No  Has patient had a PCN reaction  occurring within the last 10 years: No If all of the above answers are "NO", then may proceed with Cephalosporin use.     FAMILY HISTORY: Family History  Problem Relation Age of Onset   CAD Mother 50       CABG/stents   Breast cancer Mother    Kidney failure Mother    Asthma Father    CAD Father 86   Allergies Father    CAD Brother    CAD Brother 52       9 stents   CAD Brother 74       7 stents   Heart attack Brother       Objective:  Blood pressure 125/60, pulse 84, height 4\' 11"  (1.499 m), weight 185 lb 12.8 oz (84.3 kg), SpO2 97 %. General: No acute distress.  Patient appears well-groomed.   Head:  Normocephalic/atraumatic Eyes:  Fundi examined but not visualized Neck: supple, no paraspinal tenderness, full range of motion Heart:  Regular rate and rhythm Lungs:  Clear to auscultation bilaterally Back: No paraspinal tenderness Neurological Exam: alert and oriented to person, place, and time.  Speech fluent and not dysarthric, language intact.  CN II-XII intact. Bulk and tone normal, muscle strength 5/5 throughout.  Sensation to light touch intact.  Deep tendon reflexes 2+ throughout, toes downgoing.  Finger to nose testing intact.  Gait normal, Romberg negative.   Metta Clines, DO  CC: Marda Stalker, PA-C

## 2022-10-04 ENCOUNTER — Encounter: Payer: Self-pay | Admitting: Neurology

## 2022-10-04 ENCOUNTER — Ambulatory Visit (INDEPENDENT_AMBULATORY_CARE_PROVIDER_SITE_OTHER): Payer: Medicare Other | Admitting: Neurology

## 2022-10-04 VITALS — BP 125/60 | HR 84 | Ht 59.0 in | Wt 185.8 lb

## 2022-10-04 DIAGNOSIS — G44321 Chronic post-traumatic headache, intractable: Secondary | ICD-10-CM | POA: Diagnosis not present

## 2022-10-04 DIAGNOSIS — G5622 Lesion of ulnar nerve, left upper limb: Secondary | ICD-10-CM

## 2022-10-04 DIAGNOSIS — G629 Polyneuropathy, unspecified: Secondary | ICD-10-CM | POA: Diagnosis not present

## 2022-10-04 NOTE — Patient Instructions (Addendum)
Refer to orthopedics for left ulnar neuropathy at the elbow.   Contact me if you would like to start duloxetine for nerve pain and headache Follow up 6 months.

## 2022-10-21 ENCOUNTER — Other Ambulatory Visit: Payer: Self-pay

## 2022-10-21 MED ORDER — ARNUITY ELLIPTA 200 MCG/ACT IN AEPB: 1.0000 | INHALATION_SPRAY | Freq: Every day | RESPIRATORY_TRACT | 5 refills | Status: DC

## 2022-10-24 ENCOUNTER — Other Ambulatory Visit: Payer: Self-pay | Admitting: Cardiology

## 2022-10-25 ENCOUNTER — Telehealth: Payer: Self-pay | Admitting: Cardiology

## 2022-10-25 MED ORDER — LOSARTAN POTASSIUM 25 MG PO TABS: ORAL_TABLET | ORAL | 3 refills | Status: DC

## 2022-10-25 NOTE — Telephone Encounter (Signed)
Spoke to patient she stated Losartan refill already sent in.

## 2022-10-25 NOTE — Telephone Encounter (Signed)
Patient is calling back stating she made a mistake in understanding and they do carry this medication, but they are just waiting for refill. She is requesting refill be sent to requested pharmacy:  [id number] - [place], [place] - [address] AT [facility] OF [address]

## 2022-10-25 NOTE — Telephone Encounter (Signed)
Pt c/o medication issue:  1. Name of Medication: losartan (COZAAR) 25 MG tablet   2. How are you currently taking this medication (dosage and times per day)? As prescribed   3. Are you having a reaction (difficulty breathing--STAT)? No   4. What is your medication issue? The pharmacy no longer carries losartan so she was advised to call our office to have an alternative medication prescribed.

## 2022-10-25 NOTE — Telephone Encounter (Signed)
Spoke with patient and she states her pharmacy is no longer carry losartan. They informed her to give [place] a call to see what can be an alternative. I asked patient would she like [place] to send a new rx for losartan to a different pharmacy and she stated she would like to stay with he pharmacy.

## 2022-10-25 NOTE — Telephone Encounter (Signed)
Refill sent to the pharmacy electronically.  

## 2022-10-26 ENCOUNTER — Telehealth: Payer: Self-pay | Admitting: *Deleted

## 2022-10-26 NOTE — Telephone Encounter (Signed)
   Pre-operative Risk Assessment    Patient Name: Kendra Gilbert  DOB: July 21, 1955 MRN: [id number]      Request for Surgical Clearance    Procedure:   LEFT ELBOW ULNAR NERVE RELEASE AND OR TRANSPOSITION   Date of Surgery:  Clearance 11/03/22                                 Surgeon:  DR. FRED [facility]'s Group or Practice Name:  [facility] Phone number:  [phone] ATTN: MEGAN DAVIS Fax number:  [phone]   Type of Clearance Requested:   - Medical ; NO MEDICATIONS LISTED AS NEEDING TO BE HELD   Type of Anesthesia:   IV SEDATION WITH BLOCK   Additional requests/questions:    Elpidio Anis   10/26/2022, 5:08 PM

## 2022-10-28 NOTE — Telephone Encounter (Signed)
   Patient Name: Kendra Gilbert  DOB: 1956-06-08 MRN: [id number]  Primary Cardiologist: Rollene Rotunda, MD  Chart reviewed as part of pre-operative protocol coverage. Per Dr. Antoine Poche, primary cardiologist, who saw the pt most recently on 08/30/2022, "the patient has a stable chest pain pattern.  She has had no change in symptoms since her most recent ischemia evaluation. She has a high functional level and is not going for a high risk procedure.  According to ACC/AHA guidelines the patient is at acceptable risk for the planned surgery without further cardiovascular testing."       I will route this recommendation to the requesting party via Epic fax function and remove from pre-op pool.  Please call with questions.  Joylene Grapes, NP 10/28/2022, 11:07 AM

## 2022-11-23 ENCOUNTER — Other Ambulatory Visit (HOSPITAL_COMMUNITY): Payer: Self-pay | Admitting: Gastroenterology

## 2022-11-23 DIAGNOSIS — R1310 Dysphagia, unspecified: Secondary | ICD-10-CM

## 2022-11-26 ENCOUNTER — Other Ambulatory Visit: Payer: Self-pay

## 2022-11-26 ENCOUNTER — Encounter: Payer: Self-pay | Admitting: Allergy

## 2022-11-26 ENCOUNTER — Ambulatory Visit (INDEPENDENT_AMBULATORY_CARE_PROVIDER_SITE_OTHER): Payer: Medicare Other | Admitting: Allergy

## 2022-11-26 VITALS — BP 130/84 | HR 103 | Temp 98.5°F | Resp 16 | Ht 59.0 in | Wt 183.5 lb

## 2022-11-26 DIAGNOSIS — J3089 Other allergic rhinitis: Secondary | ICD-10-CM | POA: Diagnosis not present

## 2022-11-26 DIAGNOSIS — H1012 Acute atopic conjunctivitis, left eye: Secondary | ICD-10-CM | POA: Diagnosis not present

## 2022-11-26 DIAGNOSIS — J300 Vasomotor rhinitis: Secondary | ICD-10-CM | POA: Diagnosis not present

## 2022-11-26 DIAGNOSIS — J4550 Severe persistent asthma, uncomplicated: Secondary | ICD-10-CM

## 2022-11-26 NOTE — Patient Instructions (Addendum)
-   continue avoidance measures for grass pollens, weed pollens, tree pollens, mold, dust mites. - change Xyzal to Allegra 1 tab daily.  If effective then continue Allegra for now.  Will be able to rotate between the two if effective every 6 months or so. - try use of Astelin 1 spray each nostril daily as needed for runny nose to start off and see if can tolerate use.      - use Bepreve 1 drop each eye twice a day as needed for itchy/watery eyes.   - have access to albuterol inhaler 2 puffs or albuterol 1 vial via nebulizer every 4-6 hours as needed for cough/wheeze/shortness of breath/chest tightness.  May use 15-20 minutes prior to activity.   Monitor frequency of use.    Use inhaler with spacer device.   - continue Breztri 2 puffs twice a day.  - continue Arnuity 1 puff daily.    Asthma control goals:  Full participation in all desired activities (may need albuterol before activity) Albuterol use two time or less a w eek on average (not counting use with activity) Cough interfering with sleep two time or less a month Oral steroids no more than once a year No hospitalizations   Follow-up in 6 months or sooner if needed

## 2022-11-26 NOTE — Progress Notes (Signed)
Follow-up Note  RE: Kendra Gilbert MRN: [id number] DOB: 1956-07-03 Date of Office Visit: 11/26/2022   History of present illness: Kendra Gilbert is a 67 y.o. female presenting today for follow-up of severe persistent asthma, allergic rhinitis with conjunctivitis and vasomotor rhinitis.  She was last seen in the office on 07/21/2022 by myself. Since her last visit she did have surgery on her left arm to help with her ulnar nerve repair. She had her cast and stiches removed from left arm last week and thus arms feels better.  She will need to continue in physical therapy.  She states she no longer has that numbness and tingling in her hand like she had before the surgery.  She notes that heat causes her asthma to flare. She is walking around 6am in the morning for exercise and with the heat has needed to use her rescue inhaler more and it does provide relief.  She states she does have hills to climb.  She is also not driving any more and thus takes the bus and this requirs more walking.  If she is not feeling well she does try to stay inside.  She continues on Breztri 2 puffs twice a day.  We had to change flovent to Arnuity 1 puff daily due to insurance coverage.  She did see Dr. Judeth Horn and pulmonology after her last visit with me.  He did have recommendations of Tezspire if symptoms were to worsen.  She has noted more congestion with spring/pollen exposures.  She has noted since her surgery she has had more runny nose like a "water fountain" and sinus pressure.  We have been holding use of nasal sprays due to sensitivity of the nose that she has had since her concussion.  She takes xyzal in the evenings.  She does not feel like it is doing as much as it use too.  She has been using restasis for dry eye.   Review of systems: Review of Systems  Constitutional: Negative.   HENT:         See HPI  Eyes:        See HPI  Respiratory:         See HPI  Cardiovascular: Negative.    Gastrointestinal: Negative.   Musculoskeletal: Negative.   Skin: Negative.   Allergic/Immunologic: Negative.   Neurological: Negative.      All other systems negative unless noted above in HPI  Past medical/social/surgical/family history have been reviewed and are unchanged unless specifically indicated below.  No changes  Medication List: Current Outpatient Medications  Medication Sig Dispense Refill   acetaminophen (TYLENOL) 500 MG tablet Take 325 mg by mouth daily as needed for mild pain, fever or headache.     ARNUITY ELLIPTA 200 MCG/ACT AEPB Inhale 1 puff into the lungs daily. 30 each 5   azelastine (OPTIVAR) 0.05 % ophthalmic solution Place 1 drop into both eyes 2 (two) times daily as needed. 6 mL 5   Budeson-Glycopyrrol-Formoterol (BREZTRI AEROSPHERE) 160-9-4.8 MCG/ACT AERO INHALE 2 PUFFS TWICE DAILY TO PREVENT COUGH OR WHEEZE, RINSE MOUTH AFTER USE. 10.7 g 5   diphenhydrAMINE (BENADRYL) 25 MG tablet Take 25 mg by mouth every 6 (six) hours as needed.     EPINEPHrine 0.3 mg/0.3 mL IJ SOAJ injection Inject 0.3 mg into the muscle as needed for anaphylaxis. 2 each 1   furosemide (LASIX) 40 MG tablet Take 1 tablet (40 mg total) by mouth daily. Take 1 tablet daily as needed for weight gain  of 3 lb overnight and 5 lb in 1 week 90 tablet 3   ipratropium-albuterol (DUONEB) 0.5-2.5 (3) MG/3ML SOLN Use 1 vial via nebulizer every 4-6 hours as needed for cough, wheeze, shortness of breath or chest tightness 180 mL 1   isosorbide mononitrate (IMDUR) 30 MG 24 hr tablet Take 1 tablet (30 mg total) by mouth daily. 90 tablet 3   levocetirizine (XYZAL) 5 MG tablet TAKE 1 TABLET(5 MG) BY MOUTH EVERY EVENING 90 tablet 1   losartan (COZAAR) 25 MG tablet TAKE 1 TABLET(25 MG) BY MOUTH IN THE MORNING AND AT BEDTIME 180 tablet 3   losartan (COZAAR) 25 MG tablet TAKE 1 TABLET(25 MG) BY MOUTH IN THE MORNING AND AT BEDTIME 180 tablet 3   pantoprazole (PROTONIX) 20 MG tablet Take 40 mg by mouth daily.      RESTASIS 0.05 % ophthalmic emulsion 1 drop 2 (two) times daily.     rosuvastatin (CRESTOR) 20 MG tablet Take 1 tablet (20 mg total) by mouth daily. 90 tablet 3   Sodium Chloride-Sodium Bicarb 1.57 g PACK Place into the nose.     VENTOLIN HFA 108 (90 Base) MCG/ACT inhaler Inhale 2 puffs into the lungs every 4 (four) hours as needed for wheezing or shortness of breath. 18 g 1   nitroGLYCERIN (NITROSTAT) 0.4 MG SL tablet Place 1 tablet (0.4 mg total) under the tongue every 5 (five) minutes as needed for chest pain. (Patient not taking: Reported on 08/30/2022) 25 tablet 3   No current facility-administered medications for this visit.     Known medication allergies: Allergies  Allergen Reactions   Amlodipine Shortness Of Breath and Swelling   Bee Venom Anaphylaxis and Hives   Lisinopril Itching and Cough    Dry cough and severe itching in the throat    Other Other (See Comments)    Mosquito bites cause hives all over body   Aspirin Other (See Comments)    Causes stomach problems   Cefdinir     Other reaction(s): hives   Erythromycin Other (See Comments)    Ate the lining of her stomach   Hctz [Hydrochlorothiazide]     Rash    Penicillins Rash    Has patient had a PCN reaction causing immediate rash, facial/tongue/throat swelling, SOB or lightheadedness with hypotension: Yes Has patient had a PCN reaction causing severe rash involving mucus membranes or skin necrosis: Yes Has patient had a PCN reaction that required hospitalization: No  Has patient had a PCN reaction occurring within the last 10 years: No If all of the above answers are "NO", then may proceed with Cephalosporin use.      Physical examination: Blood pressure 130/84, pulse (!) 103, temperature 98.5 F (36.9 C), resp. rate 16, height 4\' 11"  (1.499 m), weight 183 lb 8 oz (83.2 kg), SpO2 97 %.  General: Alert, interactive, in no acute distress. HEENT: PERRLA, TMs pearly gray, turbinates minimally edematous with clear  discharge, post-pharynx non erythematous. Neck: Supple without lymphadenopathy. Lungs: Mildly decreased breath sounds bilaterally without wheezing, rhonchi or rales. {no increased work of breathing. CV: Normal S1, S2 without murmurs. Abdomen: Nondistended, nontender. Skin: Warm and dry, without lesions or rashes. Extremities: Wearing brace to the left arm, no clubbing, cyanosis or edema. Neuro:   Grossly intact.  Diagnositics/Labs: None today  Assessment and plan: Severe persistent asthma Allergic rhinitis with conjunctivitis Vasomotor rhinitis  - continue avoidance measures for grass pollens, weed pollens, tree pollens, mold, dust mites. - change Xyzal to [employer]  1 tab daily.  If effective then continue Allegra for now.  Will be able to rotate between the two if effective every 6 months or so. - try use of Astelin 1 spray each nostril daily as needed for runny nose to start off and see if can tolerate use.      - use Bepreve 1 drop each eye twice a day as needed for itchy/watery eyes.   - have access to albuterol inhaler 2 puffs or albuterol 1 vial via nebulizer every 4-6 hours as needed for cough/wheeze/shortness of breath/chest tightness.  May use 15-20 minutes prior to activity.   Monitor frequency of use.    Use inhaler with spacer device.   - continue Breztri 2 puffs twice a day.  - continue Arnuity 1 puff daily.    Asthma control goals:  Full participation in all desired activities (may need albuterol before activity) Albuterol use two time or less a w eek on average (not counting use with activity) Cough interfering with sleep two time or less a month Oral steroids no more than once a year No hospitalizations   Follow-up in 6 months or sooner if needed    I appreciate the opportunity to take part in Julanne's care. Please do not hesitate to contact me with questions.  Sincerely,   Margo Aye, MD Allergy/Immunology [facility]

## 2022-12-01 ENCOUNTER — Ambulatory Visit (HOSPITAL_COMMUNITY)
Admission: RE | Admit: 2022-12-01 | Discharge: 2022-12-01 | Disposition: A | Discharge status: Home / Self Care | Payer: Medicare Other | Source: Ambulatory Visit | Attending: Gastroenterology | Admitting: Gastroenterology

## 2022-12-01 DIAGNOSIS — R1310 Dysphagia, unspecified: Secondary | ICD-10-CM | POA: Diagnosis present

## 2022-12-03 ENCOUNTER — Encounter: Payer: Self-pay | Admitting: Pulmonary Disease

## 2022-12-03 ENCOUNTER — Ambulatory Visit (INDEPENDENT_AMBULATORY_CARE_PROVIDER_SITE_OTHER): Payer: Medicare Other | Admitting: Pulmonary Disease

## 2022-12-03 ENCOUNTER — Telehealth: Payer: Self-pay | Admitting: Allergy

## 2022-12-03 VITALS — BP 116/70 | HR 75 | Temp 97.7°F | Ht 59.0 in | Wt 185.2 lb

## 2022-12-03 DIAGNOSIS — J4550 Severe persistent asthma, uncomplicated: Secondary | ICD-10-CM | POA: Diagnosis not present

## 2022-12-03 MED ORDER — EPINEPHRINE 0.3 MG/0.3ML IJ SOAJ: 0.3000 mg | INTRAMUSCULAR | 1 refills | Status: AC | PRN

## 2022-12-03 NOTE — Telephone Encounter (Signed)
Patient called stating She was last seen with Dr.Padgett last Friday. She stated they sent a request over for her Epi pen but The pharmacy stated they didn't received anything. Patient wants it sent over to [facility] pharmacy.   Best Contact: [phone]

## 2022-12-03 NOTE — Patient Instructions (Signed)
Nice to see you again  Continue the inhalers - no changes  Consider using Tezspire - this an injection every 4 weeks - this would be the next step in trying to improve the symptoms relating to asthma   Return to clinic in 3 months or sooner as needed

## 2022-12-03 NOTE — Addendum Note (Signed)
Addended by: Berna Bue on: 12/03/2022 01:48 PM   Modules accepted: Orders

## 2022-12-03 NOTE — Telephone Encounter (Signed)
Sent in epipen refill to [facility] for pt

## 2022-12-03 NOTE — Progress Notes (Signed)
@Patient  ID: Kendra Gilbert, female    DOB: 1956-03-24, 67 y.o.   MRN: [id number]  Chief Complaint  Patient presents with   Follow-up    Pt states she is about the same since last visit. Has had some tightness in chest mainly if she goes up steps and up hills and also has had some wheezing.    Referring provider: Jarrett Soho, PA-C  HPI:   67 y.o. woman whom we are seeing in evaluation of dyspnea on exertion in the setting of severe severe persistent asthma.  Most recent cardiology note reviewed.  Most recent allergy and immunology note reviewed.  Symptoms largely unchanged as last visit.  Unfortunate, historically symptoms worsen with the hot weather.  Reports good adherence to Trelegy and Arnuity.  Recently seen by allergy and immunology and changed nasal regimen for rhinitis etc.  We discussed again the role and rationale for biologic therapy given what is perceived to be poorly controlled asthma with dyspnea and cough.  As in the past, she is not ready to commit to additional medications at this time.  HPI at initial visit: Patient has ongoing dyspnea on exertion.  Present for years.  May be slightly worsening over time.  She states she is active and walks every day.  However she does not walk as much as she used to.  Less distance than prior.  She is been escalated to [facility] for triple inhaled therapy as well as Arnuity added for additional ICS therapy for asthma.  No real improvement in her breathing.  She thinks the inhalers helped some but not massively.  She does use albuterol as needed with some improvement albeit short-lived.  No time of day when things are better or worse.  No position makes it better worse.  No seasonal or environmental factors she can identify to make things better or worse.  No other alleviating or exacerbating factors.  She recently had a CTA coronary scan 05/2022 that on my review interpretation reveals mosaicism otherwise clear lungs suspicious for  small airways disease, noting limitation and not seen upper portions of the lungs.  Most recent chest x-ray 04/2022 reviewed and interpreted as clear lungs bilaterally.  She reports being diagnosed with interstitial lung disease in 2012.  There is no evidence of this on recent cross-sectional imaging.  It sounds like she was hospitalized at that time with pneumonia, bronchitis, asthma exacerbation.  Suspect this was an acute pneumonitis or pneumonia.  Furthermore, she had a high-res CT scan 2015, I cannot review images but report reveals clear lungs and no evidence of interstitial lung disease.  For further workup of shortness of breath, her echo in 05/2022 did reveal grade 1 diastolic dysfunction but otherwise reassuring results.  She had a perfusion stress test 08/2020 that is normal.  She has had multiple lower extremity Dopplers that showed no DVT.  Notably, her labs have revealed persistently elevated IgE but none tested recently.  She was on Xolair and reports itchiness and MSK pain so this was stopped.  She is not sure it helped her breathing at all.    Questionaires / Pulmonary Flowsheets:   ACT:  Asthma Control Test ACT Total Score  12/03/2022  8:26 AM 12  08/12/2022  8:24 AM 12  07/21/2022  9:00 AM 14    MMRC:     No data to display           Epworth:      No data to display  Tests:   FENO:  No results found for: "NITRICOXIDE"  PFT:    Latest Ref Rng & Units 09/15/2022    9:52 AM  PFT Results  FVC-Pre L 1.49   FVC-Predicted Pre % 58   Pre FEV1/FVC % % 93   FEV1-Pre L 1.39   FEV1-Predicted Pre % 72   DLCO uncorrected ml/min/mmHg 11.03   DLCO UNC% % 66   DLCO corrected ml/min/mmHg 11.60   DLCO COR %Predicted % 70   DLVA Predicted % 106   TLC L 3.88   TLC % Predicted % 90   RV % Predicted % 110   Reviewed interpreted spirometry just of moderate restriction versus air trapping.  Bronchodilators not administered.  TLC within normal limits, air  trapping present consistent with small airways disease, DLCO is mildly reduced but likely underestimated given lung volume used is less than 75% of TLC reported and lung volumes.  WALK:     03/02/2016    8:59 AM  SIX MIN WALK  Supplimental Oxygen during Test? (L/min) No  Tech Comments: brisk pace/no SOB//lmr    Imaging: Personally reviewed and as per EMR and discussion in this note DG ESOPHAGUS W DOUBLE CM (HD)  Result Date: 12/01/2022 CLINICAL DATA:  67 year old female with history of dysphagia, dyspepsia presents with complaint of "burning in my chest after eating." EXAM: ESOPHAGUS/BARIUM SWALLOW/TABLET STUDY TECHNIQUE: Combined double and single contrast examination was performed using effervescent crystals, high-density barium, and thin liquid barium. This exam was performed by Loyce Dys PA-C, and was supervised and interpreted by Myles Rosenthal, MD. FLUOROSCOPY: Radiation Exposure Index (as provided by the fluoroscopic device): 18.2 mGy Kerma COMPARISON:  None Available. FINDINGS: Swallowing: Appears normal. No vestibular penetration or aspiration seen. Pharynx: Unremarkable. Esophagus: Normal appearance. Esophageal motility: Within normal limits. Hiatal Hernia: None. Gastroesophageal reflux: None visualized. Ingested 13mm barium tablet: Passed normally Other: Recent left elbow surgery resulted in limitation of standard positioning. IMPRESSION: Negative esophagram. Electronically Signed   By: Danae Orleans M.D.   On: 12/01/2022 09:45    Lab Results: Personally reviewed CBC    Component Value Date/Time   WBC 8.7 08/12/2022 0855   RBC 4.34 08/12/2022 0855   HGB 11.9 (L) 08/12/2022 0855   HGB 12.8 08/18/2020 1510   HCT 36.1 08/12/2022 0855   HCT 39.1 08/18/2020 1510   PLT 210.0 08/12/2022 0855   PLT 171 08/18/2020 1510   MCV 83.1 08/12/2022 0855   MCV 85 08/18/2020 1510   MCH 26.6 04/20/2022 0843   MCHC 33.0 08/12/2022 0855   RDW 14.8 08/12/2022 0855   RDW 13.3 08/18/2020 1510    LYMPHSABS 1.5 08/12/2022 0855   LYMPHSABS 1.6 10/17/2019 1134   MONOABS 0.5 08/12/2022 0855   EOSABS 0.0 08/12/2022 0855   EOSABS 0.1 10/17/2019 1134   BASOSABS 0.0 08/12/2022 0855   BASOSABS 0.0 10/17/2019 1134    BMET    Component Value Date/Time   NA 141 05/05/2022 0825   K 4.4 05/05/2022 0825   CL 102 05/05/2022 0825   CO2 29 05/05/2022 0825   GLUCOSE 91 05/05/2022 0825   GLUCOSE 106 (H) 04/20/2022 0843   BUN 32 (H) 05/05/2022 0825   CREATININE 0.88 05/05/2022 0825   CREATININE 0.72 04/15/2022 1214   CALCIUM 9.8 05/05/2022 0825   GFRNONAA >60 04/20/2022 0843   GFRAA 99 08/18/2020 1510    BNP    Component Value Date/Time   BNP 70.3 04/20/2022 0843    ProBNP No results found  for: "PROBNP"  Specialty Problems       Pulmonary Problems   Wheezing   Sleep apnea   Upper airway cough syndrome    Followed in Pulmonary clinic/ [facility]/ Wert rec hs h2 and h1 08/17/13 > and 01/20/16 > did not take h1 as directed, did not take ppi ac as rec - Allergy profile 03/02/16>  Eos 0.0/  IgE  483  Grass > tree > Ragweed, dog and dust - Sinus CT 03/09/2016 > Negative exam.  No evidence of acute or chronic sinus disease. - trial of singulair 10 mg 04/01/2016       Dyspnea    03/02/2016  Walked RA x 3 laps @ 185 ft each stopped due to  End of study,fast pace, no sob or desat   - Spirometry 03/02/2016  Suboptimal effort on the early portion of f/v but still had nl flows       Multiple pulmonary nodules determined by computed tomography of lung    See CT chest  12/25/15 largest = 3 mm/ never smoker > no f/u rec by Fleischner society guidelines         Sinusitis   Other seasonal allergic rhinitis    Allergies  Allergen Reactions   Amlodipine Shortness Of Breath and Swelling   Bee Venom Anaphylaxis and Hives   Lisinopril Itching and Cough    Dry cough and severe itching in the throat    Other Other (See Comments)    Mosquito bites cause hives all over body   Aspirin  Other (See Comments)    Causes stomach problems   Cefdinir     Other reaction(s): hives   Erythromycin Other (See Comments)    Ate the lining of her stomach   Hctz [Hydrochlorothiazide]     Rash    Penicillins Rash    Has patient had a PCN reaction causing immediate rash, facial/tongue/throat swelling, SOB or lightheadedness with hypotension: Yes Has patient had a PCN reaction causing severe rash involving mucus membranes or skin necrosis: Yes Has patient had a PCN reaction that required hospitalization: No  Has patient had a PCN reaction occurring within the last 10 years: No If all of the above answers are "NO", then may proceed with Cephalosporin use.     Immunization History  Administered Date(s) Administered   Influenza, High Dose Seasonal PF 08/05/2022   Influenza,inj,Quad PF,6+ Mos 05/20/2021   Moderna Sars-Covid-2 Vaccination 10/02/2019, 10/30/2019, 05/05/2020, 10/13/2020    Past Medical History:  Diagnosis Date   Asthma    CAD (coronary artery disease)    Moderate, nonobstructive CAD as seen on coronary CTA in 2020 with normal FFR.   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 POET in 2015 with no acute findings.   Diarrhea since 02-16-17   c dif test normal per pt   Dyspnea     with exertion, occ at rest no oxygen or inhaler use   GERD (gastroesophageal reflux disease)    H/O: rheumatic fever as child   Headache    occ left side migraine   Hyperlipidemia    Hypertension    Leg edema    Lung disease    Lung nodule   Non-alcoholic cirrhosis (HCC) 2013   no current issues  with   Pneumonia 2016   Rheumatoid arthritis (HCC)    oa also, back arthritis, hx rheumatoid     Tobacco History: Social History   Tobacco Use  Smoking Status Never   Passive exposure: Never  Smokeless Tobacco Never    Counseling given: Not Answered   Continue to not smoke  Outpatient Encounter Medications as of 12/03/2022  Medication Sig   acetaminophen (TYLENOL) 500 MG tablet Take 325 mg by mouth daily as needed for mild pain, fever or headache.   ARNUITY ELLIPTA 200 MCG/ACT AEPB Inhale 1 puff into the lungs daily.   azelastine (OPTIVAR) 0.05 % ophthalmic solution Place 1 drop into both eyes 2 (two) times daily as needed.   Budeson-Glycopyrrol-Formoterol (BREZTRI AEROSPHERE) 160-9-4.8 MCG/ACT AERO INHALE 2 PUFFS TWICE DAILY TO PREVENT COUGH OR WHEEZE, RINSE MOUTH AFTER USE.   diphenhydrAMINE (BENADRYL) 25 MG tablet Take 25 mg by mouth every 6 (six) hours as needed.   EPINEPHrine 0.3 mg/0.3 mL IJ SOAJ injection Inject 0.3 mg into the muscle as needed for anaphylaxis.   furosemide (LASIX) 40 MG tablet Take 1 tablet (40 mg total) by mouth daily. Take 1 tablet daily as needed for weight gain of 3 lb overnight and 5 lb in 1 week   ipratropium-albuterol (DUONEB) 0.5-2.5 (3) MG/3ML SOLN Use 1 vial via nebulizer every 4-6 hours as needed for cough, wheeze, shortness of breath or chest tightness   isosorbide mononitrate (IMDUR) 30 MG 24 hr tablet Take 1 tablet (30 mg total) by mouth daily.   levocetirizine (XYZAL) 5 MG tablet TAKE 1 TABLET(5 MG) BY MOUTH EVERY EVENING   losartan (COZAAR) 25 MG tablet TAKE 1 TABLET(25 MG) BY MOUTH IN THE MORNING AND AT BEDTIME   pantoprazole (PROTONIX) 20 MG tablet Take 40 mg by mouth daily.   RESTASIS 0.05 % ophthalmic emulsion 1 drop 2 (two) times daily.   rosuvastatin (CRESTOR) 20 MG tablet Take 1 tablet (20 mg total) by mouth daily.   Sodium Chloride-Sodium Bicarb 1.57 g PACK Place into the nose.   VENTOLIN HFA 108 (90 Base) MCG/ACT inhaler Inhale 2 puffs into the lungs every 4 (four) hours as needed for wheezing or shortness of breath.   nitroGLYCERIN (NITROSTAT) 0.4 MG SL tablet Place 1 tablet (0.4 mg total) under the tongue every 5 (five) minutes as needed for chest pain.  (Patient not taking: Reported on 08/30/2022)   [DISCONTINUED] losartan (COZAAR) 25 MG tablet TAKE 1 TABLET(25 MG) BY MOUTH IN THE MORNING AND AT BEDTIME   No facility-administered encounter medications on file as of 12/03/2022.     Review of Systems  Review of Systems  N/a Physical Exam  BP 116/70 (BP Location: Right Arm, Patient Position: Sitting, Cuff Size: Normal)   Pulse 75   Temp 97.7 F (36.5 C) (Oral)   Ht 4\' 11"  (1.499 m)   Wt 185 lb 3.2 oz (84 kg)   SpO2 98% Comment: RA  BMI 37.41 kg/m   Wt Readings from Last 5 Encounters:  12/03/22 185 lb 3.2 oz (84 kg)  11/26/22 183 lb 8 oz (83.2 kg)  10/04/22 185 lb 12.8 oz (84.3 kg)  09/15/22 186 lb (84.4 kg)  08/30/22 184 lb 9.6 oz (83.7 kg)    BMI Readings from Last 5 Encounters:  12/03/22 37.41 kg/m  11/26/22 37.06 kg/m  10/04/22 37.53 kg/m  09/15/22 37.57 kg/m  08/30/22 37.28 kg/m     Physical Exam General: Sitting in chair, no acute distress Eyes: EOMI, no icterus Neck:  Supple, no JVP appreciated Pulmonary: Scattered faint end expiratory wheezing, normal work of breathing Cardiovascular: Warm, trace to 1+ pitting edema left greater than right Abdomen: Nondistended, sounds present MSK: No synovitis, no joint effusion Neuro: Normal gait, no weakness Psych: Normal mood, full affect  Assessment & Plan:   Severe persistent asthma: On triple inhaled therapy via Breztri and Arnuity for additional ICS therapy.  Still with shortness of breath.  Worse when outdoors.  Some good relief with albuterol.  Suspect difficult to control with inhalers given elevated IgE in the past.  She reports adverse reaction to Xolair including itching, musculoskeletal pain.  Eosinophils not elevated.  Recommend using [place]sp[place] given poorly controlled disease.  She declines again at this time not wanting to add additional medications.  Also discussed thrice weekly azithromycin but given her issues with erythromycin in the past this likely is  not a good option.  Dyspnea on exertion: Suspect multifactorial and related to deconditioning, she is active but less active than prior, habitus, poorly controlled asthma.  Chest imaging in the past has been clear.  Low suspicion for additional parenchymal disease or other pulmonary contributors.  TTE in 05/2022 did demonstrate diastolic dysfunction, she has lower extremity swelling so cardiac contributors are certainly possible if not likely.  PFTs largely normal which is reassuring.  No other contributors from the lungs that can be identified.  Lung nodules: Repeat CT 09/2022.  Stable to resolved.  No further imaging required.   Return in about 3 months (around 03/05/2023).   Karren Burly, MD 12/03/2022

## 2023-01-21 ENCOUNTER — Other Ambulatory Visit (HOSPITAL_COMMUNITY): Payer: Self-pay

## 2023-03-03 ENCOUNTER — Ambulatory Visit (INDEPENDENT_AMBULATORY_CARE_PROVIDER_SITE_OTHER): Payer: Medicare Other | Admitting: Pulmonary Disease

## 2023-03-03 ENCOUNTER — Encounter: Payer: Self-pay | Admitting: Pulmonary Disease

## 2023-03-03 VITALS — BP 108/60 | HR 92 | Ht 59.0 in | Wt 184.8 lb

## 2023-03-03 DIAGNOSIS — J302 Other seasonal allergic rhinitis: Secondary | ICD-10-CM

## 2023-03-03 DIAGNOSIS — J4550 Severe persistent asthma, uncomplicated: Secondary | ICD-10-CM | POA: Diagnosis not present

## 2023-03-03 NOTE — Progress Notes (Signed)
@Patient  ID: Kendra Gilbert, female    DOB: [birth year], 67 y.o.   MRN: [id number]  Chief Complaint  Patient presents with   Follow-up    Pt states depending on weather her asthma flares, has been using her breztri, and arnuity inhaler,     Referring provider: Jarrett Soho, PA-C  HPI:   67 y.o. woman whom we are seeing in evaluation of dyspnea on exertion in the setting of severe severe persistent asthma.    Symptoms largely stable.  Cough comes and goes.  Level of dyspnea increases and decreases.  Largely given by temperature outdoors etc.  Has changed some antihistamine to Allegra.  Was more stopped up now running more.  Voice comes and goes.  Largely symptoms seem unchanged but she is please eval the emergency room and the hospital for some time.  She finds her combination of Arnuity and Markus Daft has been quite beneficial and that since.  We discussed potential better improved symptoms with additional Biologics changing nasal regimen etc.  At this time she prefers to keep things status quo.  HPI at initial visit: Patient has ongoing dyspnea on exertion.  Present for years.  May be slightly worsening over time.  She states she is active and walks every day.  However she does not walk as much as she used to.  Less distance than prior.  She is been escalated to [facility] for triple inhaled therapy as well as Arnuity added for additional ICS therapy for asthma.  No real improvement in her breathing.  She thinks the inhalers helped some but not massively.  She does use albuterol as needed with some improvement albeit short-lived.  No time of day when things are better or worse.  No position makes it better worse.  No seasonal or environmental factors she can identify to make things better or worse.  No other alleviating or exacerbating factors.  She recently had a CTA coronary scan 05/2022 that on my review interpretation reveals mosaicism otherwise clear lungs suspicious for small airways  disease, noting limitation and not seen upper portions of the lungs.  Most recent chest x-ray 04/2022 reviewed and interpreted as clear lungs bilaterally.  She reports being diagnosed with interstitial lung disease in 2012.  There is no evidence of this on recent cross-sectional imaging.  It sounds like she was hospitalized at that time with pneumonia, bronchitis, asthma exacerbation.  Suspect this was an acute pneumonitis or pneumonia.  Furthermore, she had a high-res CT scan 2015, I cannot review images but report reveals clear lungs and no evidence of interstitial lung disease.  For further workup of shortness of breath, her echo in 05/2022 did reveal grade 1 diastolic dysfunction but otherwise reassuring results.  She had a perfusion stress test 08/2020 that is normal.  She has had multiple lower extremity Dopplers that showed no DVT.  Notably, her labs have revealed persistently elevated IgE but none tested recently.  She was on Xolair and reports itchiness and MSK pain so this was stopped.  She is not sure it helped her breathing at all.    Questionaires / Pulmonary Flowsheets:   ACT:  Asthma Control Test ACT Total Score  12/03/2022  8:26 AM 12  08/12/2022  8:24 AM 12  07/21/2022  9:00 AM 14    MMRC:     No data to display          Epworth:      No data to display  Tests:   FENO:  No results found for: "NITRICOXIDE"  PFT:    Latest Ref Rng & Units 09/15/2022    9:52 AM  PFT Results  FVC-Pre L 1.49   FVC-Predicted Pre % 58   Pre FEV1/FVC % % 93   FEV1-Pre L 1.39   FEV1-Predicted Pre % 72   DLCO uncorrected ml/min/mmHg 11.03   DLCO UNC% % 66   DLCO corrected ml/min/mmHg 11.60   DLCO COR %Predicted % 70   DLVA Predicted % 106   TLC L 3.88   TLC % Predicted % 90   RV % Predicted % 110   Reviewed interpreted spirometry just of moderate restriction versus air trapping.  Bronchodilators not administered.  TLC within normal limits, air trapping present  consistent with small airways disease, DLCO is mildly reduced but likely underestimated given lung volume used is less than 75% of TLC reported and lung volumes.  WALK:     03/02/2016    8:59 AM  SIX MIN WALK  Supplimental Oxygen during Test? (L/min) No  Tech Comments: brisk pace/no SOB//lmr    Imaging: Personally reviewed and as per EMR and discussion in this note No results found.  Lab Results: Personally reviewed CBC    Component Value Date/Time   WBC 8.7 08/12/2022 0855   RBC 4.34 08/12/2022 0855   HGB 11.9 (L) 08/12/2022 0855   HGB 12.8 08/18/2020 1510   HCT 36.1 08/12/2022 0855   HCT 39.1 08/18/2020 1510   PLT 210.0 08/12/2022 0855   PLT 171 08/18/2020 1510   MCV 83.1 08/12/2022 0855   MCV 85 08/18/2020 1510   MCH 26.6 04/20/2022 0843   MCHC 33.0 08/12/2022 0855   RDW 14.8 08/12/2022 0855   RDW 13.3 08/18/2020 1510   LYMPHSABS 1.5 08/12/2022 0855   LYMPHSABS 1.6 10/17/2019 1134   MONOABS 0.5 08/12/2022 0855   EOSABS 0.0 08/12/2022 0855   EOSABS 0.1 10/17/2019 1134   BASOSABS 0.0 08/12/2022 0855   BASOSABS 0.0 10/17/2019 1134    BMET    Component Value Date/Time   NA 141 05/05/2022 0825   K 4.4 05/05/2022 0825   CL 102 05/05/2022 0825   CO2 29 05/05/2022 0825   GLUCOSE 91 05/05/2022 0825   GLUCOSE 106 (H) 04/20/2022 0843   BUN 32 (H) 05/05/2022 0825   CREATININE 0.88 05/05/2022 0825   CREATININE 0.72 04/15/2022 1214   CALCIUM 9.8 05/05/2022 0825   GFRNONAA >60 04/20/2022 0843   GFRAA 99 08/18/2020 1510    BNP    Component Value Date/Time   BNP 70.3 04/20/2022 0843    ProBNP No results found for: "PROBNP"  Specialty Problems       Pulmonary Problems   Wheezing   Sleep apnea   Upper airway cough syndrome    Followed in Pulmonary clinic/ [facility]/ Wert rec hs h2 and h1 08/17/13 > and 01/20/16 > did not take h1 as directed, did not take ppi ac as rec - Allergy profile 03/02/16>  Eos 0.0/  IgE  483  Grass > tree > Ragweed, dog and  dust - Sinus CT 03/09/2016 > Negative exam.  No evidence of acute or chronic sinus disease. - trial of singulair 10 mg 04/01/2016       Dyspnea    03/02/2016  Walked RA x 3 laps @ 185 ft each stopped due to  End of study,fast pace, no sob or desat   - Spirometry 03/02/2016  Suboptimal effort on the early portion of  f/v but still had nl flows       Multiple pulmonary nodules determined by computed tomography of lung    See CT chest  12/25/15 largest = 3 mm/ never smoker > no f/u rec by Fleischner society guidelines         Sinusitis   Other seasonal allergic rhinitis    Allergies  Allergen Reactions   Amlodipine Shortness Of Breath and Swelling   Bee Venom Anaphylaxis and Hives   Lisinopril Itching and Cough    Dry cough and severe itching in the throat    Other Other (See Comments)    Mosquito bites cause hives all over body   Aspirin Other (See Comments)    Causes stomach problems   Cefdinir     Other reaction(s): hives   Erythromycin Other (See Comments)    Ate the lining of her stomach   Hctz [Hydrochlorothiazide]     Rash    Penicillins Rash    Has patient had a PCN reaction causing immediate rash, facial/tongue/throat swelling, SOB or lightheadedness with hypotension: Yes Has patient had a PCN reaction causing severe rash involving mucus membranes or skin necrosis: Yes Has patient had a PCN reaction that required hospitalization: No  Has patient had a PCN reaction occurring within the last 10 years: No If all of the above answers are "NO", then may proceed with Cephalosporin use.     Immunization History  Administered Date(s) Administered   Influenza, High Dose Seasonal PF 08/05/2022   Influenza,inj,Quad PF,6+ Mos 05/20/2021   Moderna Sars-Covid-2 Vaccination 10/02/2019, 10/30/2019, 05/05/2020, 10/13/2020    Past Medical History:  Diagnosis Date   Asthma    CAD (coronary artery disease)    Moderate, nonobstructive CAD as seen on coronary CTA in 2020 with normal  FFR.   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 POET in 2015 with no acute findings.   Diarrhea since 02-16-17   c dif test normal per pt   Dyspnea     with exertion, occ at rest no oxygen or inhaler use   GERD (gastroesophageal reflux disease)    H/O: rheumatic fever as child   Headache    occ left side migraine   Hyperlipidemia    Hypertension    Leg edema    Lung disease    Lung nodule   Non-alcoholic cirrhosis (HCC) 2013   no current issues with   Pneumonia 2016   Rheumatoid arthritis (HCC)    oa also, back arthritis, hx rheumatoid     Tobacco History: Social History   Tobacco Use  Smoking Status Never   Passive exposure: Never  Smokeless Tobacco Never   Counseling given: Not Answered   Continue to not smoke  Outpatient Encounter Medications as of 03/03/2023  Medication Sig   acetaminophen (TYLENOL) 500 MG tablet Take 325 mg by mouth daily as needed for mild pain, fever or headache.   ARNUITY ELLIPTA 200 MCG/ACT AEPB Inhale 1 puff into the lungs daily.   azelastine (OPTIVAR) 0.05 % ophthalmic solution Place 1 drop into both eyes 2 (two) times daily as needed.   Budeson-Glycopyrrol-Formoterol (BREZTRI AEROSPHERE) 160-9-4.8 MCG/ACT AERO INHALE 2 PUFFS TWICE DAILY TO PREVENT COUGH OR WHEEZE, RINSE MOUTH AFTER USE.   diphenhydrAMINE (BENADRYL) 25  MG tablet Take 25 mg by mouth every 6 (six) hours as needed.   EPINEPHrine 0.3 mg/0.3 mL IJ SOAJ injection Inject 0.3 mg into the muscle as needed for anaphylaxis.   furosemide (LASIX) 40 MG tablet Take 1 tablet (40 mg total) by mouth daily. Take 1 tablet daily as needed for weight gain of 3 lb overnight and 5 lb in 1 week   ipratropium-albuterol (DUONEB) 0.5-2.5 (3) MG/3ML SOLN Use 1 vial via nebulizer every 4-6 hours as needed for cough, wheeze, shortness of  breath or chest tightness   isosorbide mononitrate (IMDUR) 30 MG 24 hr tablet Take 1 tablet (30 mg total) by mouth daily.   levocetirizine (XYZAL) 5 MG tablet TAKE 1 TABLET(5 MG) BY MOUTH EVERY EVENING   losartan (COZAAR) 25 MG tablet TAKE 1 TABLET(25 MG) BY MOUTH IN THE MORNING AND AT BEDTIME   pantoprazole (PROTONIX) 20 MG tablet Take 40 mg by mouth daily.   RESTASIS 0.05 % ophthalmic emulsion 1 drop 2 (two) times daily.   rosuvastatin (CRESTOR) 20 MG tablet Take 1 tablet (20 mg total) by mouth daily.   Sodium Chloride-Sodium Bicarb 1.57 g PACK Place into the nose.   VENTOLIN HFA 108 (90 Base) MCG/ACT inhaler Inhale 2 puffs into the lungs every 4 (four) hours as needed for wheezing or shortness of breath.   nitroGLYCERIN (NITROSTAT) 0.4 MG SL tablet Place 1 tablet (0.4 mg total) under the tongue every 5 (five) minutes as needed for chest pain. (Patient not taking: Reported on 08/30/2022)   No facility-administered encounter medications on file as of 03/03/2023.     Review of Systems  Review of Systems  N/a Physical Exam  BP 108/60   Pulse 92   Ht 4\' 11"  (1.499 m)   Wt 184 lb 12.8 oz (83.8 kg)   SpO2 95%   BMI 37.33 kg/m   Wt Readings from Last 5 Encounters:  03/03/23 184 lb 12.8 oz (83.8 kg)  12/03/22 185 lb 3.2 oz (84 kg)  11/26/22 183 lb 8 oz (83.2 kg)  10/04/22 185 lb 12.8 oz (84.3 kg)  09/15/22 186 lb (84.4 kg)    BMI Readings from Last 5 Encounters:  03/03/23 37.33 kg/m  12/03/22 37.41 kg/m  11/26/22 37.06 kg/m  10/04/22 37.53 kg/m  09/15/22 37.57 kg/m     Physical Exam General: Sitting in chair, no acute distress Eyes: EOMI, no icterus Neck: Supple, no JVP appreciated Pulmonary: Scattered faint end expiratory wheezing, normal work of breathing Cardiovascular: Warm, trace to 1+ pitting edema left greater than right Abdomen: Nondistended, sounds present MSK: No synovitis, no joint effusion Neuro: Normal gait, no weakness Psych: Normal mood, full  affect  Assessment & Plan:   Severe persistent asthma: On triple inhaled therapy via Breztri and Arnuity for additional ICS therapy.  Still with shortness of breath, some cough.  Worse when outdoors.  Some good relief with albuterol.  Suspect difficult to control with inhalers given elevated IgE in the past.  She reports adverse reaction to Xolair including itching, musculoskeletal pain.  Eosinophils not elevated.  I do think there is more room to improve her symptom control but she is resistant to Biologics, testify would be her option at this time.  Encouraged ongoing allergy/immunology follow-up.  Also discussed thrice weekly azithromycin but given her issues with erythromycin in the past this likely is not a good option.  Dyspnea on exertion: Suspect multifactorial and related to deconditioning, she is active but less active than prior, habitus, poorly controlled  asthma.  Chest imaging in the past has been clear.  Low suspicion for additional parenchymal disease or other pulmonary contributors.  TTE in 05/2022 did demonstrate diastolic dysfunction, she has lower extremity swelling so cardiac contributors are certainly possible if not likely.  PFTs largely normal which is reassuring.  No other contributors from the lungs that can be identified.  Lung nodules: Repeat CT 09/2022.  Stable to resolved.  No further imaging required.   Return in about 6 months (around 09/02/2023) for f/u Dr. Judeth Horn.   Karren Burly, MD 03/03/2023

## 2023-03-03 NOTE — Patient Instructions (Addendum)
Nice to see you again  No changes to inhalers  I am glad the inhalers are helping a bit  Return to clinic in 6 months or sooner as needed

## 2023-03-15 ENCOUNTER — Other Ambulatory Visit: Payer: Self-pay | Admitting: Family Medicine

## 2023-03-15 DIAGNOSIS — Z1231 Encounter for screening mammogram for malignant neoplasm of breast: Secondary | ICD-10-CM

## 2023-03-30 NOTE — Progress Notes (Signed)
Office Visit Note  Patient: Kendra Gilbert             Date of Birth: 1955-08-25           MRN: [id number]             PCP: Jarrett Soho, PA-C Referring: Jarrett Soho, PA-C Visit Date: 04/12/2023   Subjective:  Follow-up (Doing good)   History of Present Illness: Kendra Gilbert is a 67 y.o. female here for follow up for her inflammatory joint pain and history of RA with findings of erosive arthritis seen here last year.  During that time she had ulnar nerve transposition at the left elbow with a good relief of nerve impingement.  Currently is scheduled for right thumb LRTI surgery next week with Dr. Melvyn Novas.  She saw Dr. Everlena Cooper for persistent headaches and peripheral numbness symptoms was recently started on Lyrica 50 mg twice daily for this.  She has not had any new recent steroid medications for inflammation and feels these usually just provide very transient improvement.  Still has persistent considerable lower extremity swelling without overlying rashes.  This is bad first thing in the morning as well as later in the day.  Takes furosemide 40 mg only some mornings because the urinary frequency is very limiting/hours after taking. Facial rash is pretty active occasionally associated with small sores or ulcers.  Also has patchy rash and bruising on her forearms without any preceding injury.  Will wake up with new spots in the morning from overnight these are initially red subsequently turned darker and yellow for eventually fading.  Not particularly painful or itchy.  Previous HPI 04/15/2022 Kendra Gilbert is a 67 y.o. female here for follow up for her inflammatory joint pain and history of RA with findings of erosive arthritis seen here last year. She has not been on long term DMARD treatment mostly doing pretty well with use of thumb brace and notices finger joint pains flaring up with weather changes. Her new issue bringing her today is increased pain and swelling in the  left leg. Pain at the ankle with radiation up to her knee and swelling that is constantly present and worse as the day goes on. She tried wearing high sock without significant improvement. She had recent PT course with a good improvement in tendinitis. She saw Dr. Victorino Dike [facility] for this and was referred to neurology for evaluation but waiting for this.   Previous HPI 12/19/2020 Kendra Gilbert is a 67 y.o. female here for follow up for her inflammatory joint pain and history of RA. Lab testing at initial visit was negative for RA associated antibodies or ENA panel elements not already excluded. Her sedimentation rate was quite positive at 89. Xrays reviewed did not show much inflammatory arthritis change in her feet but hands showed DIP predominant change with early central erosions consistent with erosive OA. She has previously already tried joint injection without significant benefit, oral steroids with excessive weight gain, limited tolerance for NSAIDs with GI irritation and asthma symptoms, PT and OT limited benefit, and currently wears right wrist and left ankle braces.   12/05/20 She has reported histroy of RA since many years ago not consistently on treatment. She has been noticing pain and swelling occurring in both hands both feet and ankles intermittently but can be more problematic over time.  Particular has had issues with the right elbow wrist and base of the thumb and in her left ankle which she has been seeing for  posterior tibial tendon dysfunction.  She notices occasional swelling but mostly has had bony enlargement of distal finger joints but also in the right wrist and base of the thumb.  She underwent fairly recent injection of seems to be the right first [facility] joint but did not have a great amount of relief.  She has discussed with her orthopedic surgeons whether surgical management would be beneficial or if there is a recommendation for additional medical treatments of an  inflammatory arthritis.  She is also been experiencing facial rashes for quite some time that she most often notices exacerbated by heat exposure such as outdoors hot showers and some increased with the mask wearing.   Review of Systems  Constitutional:  Positive for fatigue.  HENT:  Positive for mouth sores and mouth dryness.   Eyes:  Positive for dryness.  Respiratory:  Positive for shortness of breath.   Cardiovascular:  Positive for chest pain and palpitations.  Gastrointestinal:  Positive for constipation and diarrhea. Negative for blood in stool.  Endocrine: Negative for increased urination.  Genitourinary:  Negative for involuntary urination.  Musculoskeletal:  Positive for joint pain, gait problem, joint pain, joint swelling, myalgias, muscle weakness, morning stiffness, muscle tenderness and myalgias.  Skin:  Positive for color change, rash, redness and sensitivity to sunlight. Negative for hair loss.  Allergic/Immunologic: Positive for susceptible to infections.  Neurological:  Positive for dizziness and headaches.  Hematological:  Negative for swollen glands.  Psychiatric/Behavioral:  Negative for depressed mood and sleep disturbance. The patient is not nervous/anxious.     PMFS History:  Patient Active Problem List   Diagnosis Date Noted   Bruising 04/12/2023   CAD (coronary artery disease) 04/28/2022   Obesity 04/28/2022   Pain in left ankle and joints of left foot 04/15/2022   Dyspepsia 02/05/2022   Dysphagia 02/05/2022   Malignant tumor of colon (HCC) 02/05/2022   Other seasonal allergic rhinitis 02/05/2022   Other specified disorders of kidney and ureter 02/05/2022   Other specified symptoms and signs involving the digestive system and abdomen 02/05/2022   Peripheral venous insufficiency 02/05/2022   History of colonic polyps 02/05/2022   Primary osteoarthritis, left ankle and foot 02/05/2022   Rheumatoid arthritis (HCC) 02/05/2022   Skin sensation disturbance  02/05/2022   Leg swelling 12/15/2021   Partial thickness rotator cuff tear 09/09/2021   Impingement syndrome of left shoulder region 08/22/2021   Osteoarthritis of acromioclavicular joint 08/22/2021   Sinusitis 08/05/2021   Pain in joint of left elbow 06/16/2021   Pain in joint of left shoulder 06/16/2021   Erosive osteoarthritis of both hands 12/19/2020   History of rheumatoid arthritis 12/05/2020   Bilateral hand pain 12/05/2020   Tibialis posterior tendinitis 06/30/2020   Pain in left foot 06/30/2020   Acquired short Achilles tendon of left lower extremity 06/30/2020   Radial styloid tenosynovitis of right hand 06/17/2020   Pain and swelling of right wrist 05/02/2020   Localized edema 08/25/2019   Educated about COVID-19 virus infection 08/25/2019   Alternating constipation and diarrhea 04/23/2019   Blood in feces 04/23/2019   Epigastric pain 04/23/2019   Gastroesophageal reflux disease 04/23/2019   Hematochezia 04/23/2019   Hypercholesterolemia 04/23/2019   Irritable bowel syndrome without diarrhea 04/23/2019   Polyp of colon 04/23/2019   Essential hypertension 02/14/2019   Fibromyalgia 01/16/2019   Primary osteoarthritis involving multiple joints 01/16/2019   DDD (degenerative disc disease), cervical 09/20/2018   Chest pain 02/17/2016   Dyslipidemia 02/17/2016   Family history  of heart disease    Dyspnea 01/20/2016   Multiple pulmonary nodules determined by computed tomography of lung 01/20/2016   Unspecified osteoarthritis, unspecified site 11/16/2013   Coronary artery calcification 08/23/2013   Upper airway cough syndrome 08/19/2013   Exertional chest pain 08/19/2013   Restless leg syndrome 11/14/2012   Sleep apnea 10/03/2012   Wheezing 04/12/2011    Past Medical History:  Diagnosis Date   Asthma    CAD (coronary artery disease)    Moderate, nonobstructive CAD as seen on coronary CTA in 2020 with normal FFR.   Cataracts, bilateral    Chronic kidney disease     cyst in left kidney   Class 2 obesity    Colon cancer [facility]) 2017   surgical tx  removal of polyp   Concussion 04/20/2021   Coronary artery calcification    a. POET in 2015 with no acute findings.   Diarrhea since 02-16-17   c dif test normal per pt   Dyspnea     with exertion, occ at rest no oxygen or inhaler use   GERD (gastroesophageal reflux disease)    H/O: rheumatic fever as child   Headache    occ left side migraine   Hyperlipidemia    Hypertension    Leg edema    Lung disease    Lung nodule   Non-alcoholic cirrhosis (HCC) 2013   no current issues with   Pneumonia 2016   Rheumatoid arthritis (HCC)    oa also, back arthritis, hx rheumatoid     Family History  Problem Relation Age of Onset   CAD Mother 98       CABG/stents   Breast cancer Mother    Kidney failure Mother    Asthma Father    CAD Father 40   Allergies Father    CAD Brother    CAD Brother 75       9 stents   CAD Brother 67       7 stents   Heart attack Brother    Past Surgical History:  Procedure Laterality Date   ABDOMINAL HYSTERECTOMY     ovaries left   ANKLE SURGERY Left 02/12/2021   COLONOSCOPY WITH PROPOFOL N/A 03/23/2017   Procedure: COLONOSCOPY WITH PROPOFOL;  Surgeon: Willis Modena, MD;  Location: [facility];  Service: Endoscopy;  Laterality: N/A;   colonscopy  2017   DILATION AND CURETTAGE OF UTERUS     few done for endometriosis   GASTROC RECESSION EXTREMITY Left 02/12/2021   Procedure: Left gastroc recession;  Surgeon: Toni Arthurs, MD;  Location: [facility];  Service: Orthopedics;  Laterality: Left;   ROTATOR CUFF REPAIR Left 09/2021   athroscopic and open   Social History   Social History Narrative   Lives alone.     Are you right handed or left handed? Right   Are you currently employed ? retire      Do you live at home alone?with sister in law      What type of home do you live in: 1 story or 2 story? one       Immunization History  Administered  Date(s) Administered   Influenza, High Dose Seasonal PF 08/05/2022   Influenza,inj,Quad PF,6+ Mos 05/20/2021   Moderna Sars-Covid-2 Vaccination 10/02/2019, 10/30/2019, 05/05/2020, 10/13/2020     Objective: Vital Signs: BP 105/62 (BP Location: Right Arm, Patient Position: Sitting, Cuff Size: Normal)   Pulse 90   Resp 15   Ht 4\' 11"  (1.499 m)  Wt 184 lb (83.5 kg)   BMI 37.16 kg/m    Physical Exam Constitutional:      Appearance: She is obese.  HENT:     Mouth/Throat:     Mouth: Mucous membranes are moist.     Pharynx: Oropharynx is clear.  Eyes:     Conjunctiva/sclera: Conjunctivae normal.  Cardiovascular:     Rate and Rhythm: Normal rate and regular rhythm.  Pulmonary:     Effort: Pulmonary effort is normal.     Breath sounds: Normal breath sounds.  Musculoskeletal:     Comments: 2+ pitting edema both legs  Lymphadenopathy:     Cervical: No cervical adenopathy.  Skin:    General: Skin is warm and dry.     Comments: Petechial rash and bruising on bilateral forearm extensor surfaces  Neurological:     Mental Status: She is alert.  Psychiatric:        Mood and Affect: Mood normal.      Musculoskeletal Exam:  Elbows full ROM left elbow well-healed surgical scar Wrists full ROM no tenderness or swelling Right first CMC joint tenderness and wearing rigid brace, large Heberden's nodes at DIP joints on both hands with no palpable swelling Knees full ROM no tenderness or swelling Ankles full ROM no tenderness or swelling  Investigation: No additional findings.  Imaging: No results found.  Recent Labs: Lab Results  Component Value Date   WBC 9.5 04/12/2023   HGB 11.8 04/12/2023   PLT 218 04/12/2023   NA 143 04/12/2023   K 3.6 04/12/2023   CL 104 04/12/2023   CO2 29 04/12/2023   GLUCOSE 102 (H) 04/12/2023   BUN 28 (H) 04/12/2023   CREATININE 1.06 (H) 04/12/2023   BILITOT 0.6 04/12/2023   ALKPHOS 129 (H) 07/12/2022   AST 22 04/12/2023   ALT 21 04/12/2023    PROT 7.5 04/12/2023   ALBUMIN 3.6 (L) 07/12/2022   CALCIUM 9.6 04/12/2023   GFRAA 99 08/18/2020    Speciality Comments: No specialty comments available.  Procedures:  No procedures performed Allergies: Amlodipine, Bee venom, Lisinopril, Other, Aspirin, Cefdinir, Erythromycin, Hctz [hydrochlorothiazide], and Penicillins   Assessment / Plan:     Visit Diagnoses: Erosive osteoarthritis of both hands - Plan: Sedimentation rate, C-reactive protein  Persistent hand pain but still appears consistent with osteoarthritis no obvious flareup of inflammation from exam today.  Rechecking sed rate CRP for disease activity assessment.  Previously had quite highly elevated sedimentation rate despite not a lot of peripheral joint synovitis appreciable.  Could be candidate for trying oral DMARD if still highly elevated and does not tolerate NSAIDs. Agree with continued follow-up with hand specialist including plan LRTI surgery for first [facility] joint arthritis.  Peripheral venous insufficiency - Could consider new venous ultrasound study to reassess, discussed trying compression, elevation, may trial course of oral diuretic as well. Pain in left ankle and joints of left foot Bruising - Plan: CBC with Differential/Platelet, COMPLETE METABOLIC PANEL WITH GFR, INR/PT, Protein / creatinine ratio, urine  Recheck complete blood count metabolic panel and urine protein creatinine ratio with peripheral swelling and bruising.  No red flags for serious blood loss.  Orders: Orders Placed This Encounter  Procedures   Sedimentation rate   C-reactive protein   CBC with Differential/Platelet   COMPLETE METABOLIC PANEL WITH GFR   INR/PT   Protein / creatinine ratio, urine   No orders of the defined types were placed in this encounter.    Follow-Up Instructions: Return in about  1 year (around 04/11/2024) for Erosive OA/edema/FMS f/u 65yr.   Fuller Plan, MD  Note - This record has been created using [employer].  Chart creation errors have been sought, but may not always  have been located. Such creation errors do not reflect on  the standard of medical care.

## 2023-04-04 NOTE — Progress Notes (Unsigned)
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 Medication:  Will start Lyrica 50mg  twice daily.  May help with headaches but will likely help with neuralgia.  Would have to check with GI specialist about potential use of Cymbalta. Advised to follow up with dentist for evaluation of left TMJ dysfunction Limit use of pain relievers to no more than 2 days out of week to prevent risk of rebound or medication-overuse headache. Follow up 6 months.       Subjective:  Kendra Gilbert is a 67 year old right-handed female with CAD, HTN, HLD, Osteoarthritis (had RA from childhood to mid-60s), asthma, bilateral cataracts, non-alcoholic cirrhosis and history of colon cancer and childhood rheumatic fever who follows up for headache and dysesthesias.    UPDATE: Due to ongoing left elbow pain and left ulnar neuropathic pain, she was referred to ortho.  .She saw Dr. Melvyn Novas at [facility].  She underwent surgery on 5/1.  Doing better.    Deferred starting gabapentin to treat chronic headaches and dysesthesias.  Headaches still daily and persistent but not often severe anymore as long as she stops the trigger (stop reading, lower light, stop screen time).  Tries to avoid pain relievers.  Takes Tylenol 500mg  once or twice a week.      HISTORY: In October 2022, she fell and hit her forehead and face on concrete after tripping outside.  She sustained forehead hematoma..  She was seen in the ED where CT head and maxillofacial personally reviewed were negative for acute fractures or intracranial abnormalities.  Since then, she started having headaches.  They are left sided (rarely right  sided) pressure in the eye and left side of head, a dull pressure that gradually becomes severe later in the day.  Associated photosensitivity.  No nausea, vomiting, phonophobia, visual disturbance, or unilateral numbness or weakness.  It is persistent.  Has some neck pain but subsequently underwent left shoulder surgery.  Reading and working on computer aggravates it.  Does not treat with analgesics.  She was treated at [facility] for concussion which helped.  She underwent vestibular rehab.  She was experiencing rhinorrhea and has a history of chronic sinusitis, so it was suspected that her ongoing headaches were related to allergies and sinusitis.  She saw ENT.  CT sinuses on 08/27/2021 were normal.  Eye exams have been unremarkable except for dry eye for which she received drops.     In addition, over the past year she notes burning in her feet and hands.  It is also persistent.  Involves the left foot and calf and lateral right foot.  Involves the fingers in the hand.  Sometimes her fingers or toes will curl and get stuck.  She had rheumatoid arthritis since childhood but was told by rheumatology that it changed to osteoarthritis a couple of years ago.  No longer on Plaquenil.  Sed rate and CRP this month were 104 and 11.3 respectively.  Autoimmune labs from June 2022 include sed rate 89, negative RF, negative CCP antibody, ds-DNA antibody 4, negative RNP antibody.   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  NCV-EMG on 06/08/2022 revealed  mild left ulnar neuropathy with slowing across the elbow and chronic mild bilateral L4 radiculopathy but no large fiber sensorimotor polyneuropathy.  She she does endorse pain and numbness radiating up the arm along the ulnar distribution.  Elbow is tender to touch and when leaning just a little bit.  Always tries to keep arm straight, which is difficult at times.  She has neck and  back pain.  Sometimes she feels pain down the legs as well.    Previously took gabapentin, which upset her stomach.  Did not start nortriptyline because her pharmacist was concerned about side effects and interactions with her other medications.  Cannot take NSAIDs due to GERD.  PAST MEDICAL HISTORY: Past Medical History:  Diagnosis Date   Asthma    CAD (coronary artery disease)    Moderate, nonobstructive CAD as seen on coronary CTA in 2020 with normal FFR.   Cataracts, bilateral    Chronic kidney disease    cyst in left kidney   Class 2 obesity    Colon cancer [facility]) 2017   surgical tx  removal of polyp   Concussion 04/20/2021   Coronary artery calcification    a. POET in 2015 with no acute findings.   Diarrhea since 02-16-17   c dif test normal per pt   Dyspnea     with exertion, occ at rest no oxygen or inhaler use   GERD (gastroesophageal reflux disease)    H/O: rheumatic fever as child   Headache    occ left side migraine   Hyperlipidemia    Hypertension    Leg edema    Lung disease    Lung nodule   Non-alcoholic cirrhosis (HCC) 2013   no current issues with   Pneumonia 2016   Rheumatoid arthritis (HCC)    oa also, back arthritis, hx rheumatoid     MEDICATIONS: Current Outpatient Medications on File Prior to Visit  Medication Sig Dispense Refill   acetaminophen (TYLENOL) 500 MG tablet Take 325 mg by mouth daily as needed for mild pain, fever or headache.     ARNUITY ELLIPTA 200 MCG/ACT AEPB Inhale 1 puff into the lungs daily. 30 each 5   azelastine (OPTIVAR) 0.05 % ophthalmic solution Place 1 drop into both eyes 2 (two) times daily as needed. 6 mL 5   Budeson-Glycopyrrol-Formoterol (BREZTRI AEROSPHERE) 160-9-4.8 MCG/ACT AERO INHALE 2 PUFFS TWICE DAILY TO PREVENT COUGH OR WHEEZE, RINSE MOUTH AFTER USE. 10.7 g 5   diphenhydrAMINE (BENADRYL) 25 MG tablet Take 25 mg by mouth every 6 (six) hours as needed.     EPINEPHrine 0.3 mg/0.3 mL IJ SOAJ injection Inject 0.3 mg  into the muscle as needed for anaphylaxis. 2 each 1   furosemide (LASIX) 40 MG tablet Take 1 tablet (40 mg total) by mouth daily. Take 1 tablet daily as needed for weight gain of 3 lb overnight and 5 lb in 1 week 90 tablet 3   ipratropium-albuterol (DUONEB) 0.5-2.5 (3) MG/3ML SOLN Use 1 vial via nebulizer every 4-6 hours as needed for cough, wheeze, shortness of breath or chest tightness 180 mL 1   isosorbide mononitrate (IMDUR) 30 MG 24 hr tablet Take 1 tablet (30 mg total) by mouth daily. 90 tablet 3   levocetirizine (XYZAL) 5 MG tablet TAKE 1 TABLET(5 MG) BY MOUTH EVERY EVENING 90 tablet 1   losartan (COZAAR) 25 MG tablet TAKE 1 TABLET(25 MG) BY MOUTH IN THE MORNING AND AT BEDTIME 180 tablet 3   nitroGLYCERIN (NITROSTAT) 0.4 MG  SL tablet Place 1 tablet (0.4 mg total) under the tongue every 5 (five) minutes as needed for chest pain. (Patient not taking: Reported on 08/30/2022) 25 tablet 3   pantoprazole (PROTONIX) 20 MG tablet Take 40 mg by mouth daily.     RESTASIS 0.05 % ophthalmic emulsion 1 drop 2 (two) times daily.     rosuvastatin (CRESTOR) 20 MG tablet Take 1 tablet (20 mg total) by mouth daily. 90 tablet 3   Sodium Chloride-Sodium Bicarb 1.57 g PACK Place into the nose.     VENTOLIN HFA 108 (90 Base) MCG/ACT inhaler Inhale 2 puffs into the lungs every 4 (four) hours as needed for wheezing or shortness of breath. 18 g 1   No current facility-administered medications on file prior to visit.    ALLERGIES: Allergies  Allergen Reactions   Amlodipine Shortness Of Breath and Swelling   Bee Venom Anaphylaxis and Hives   Lisinopril Itching and Cough    Dry cough and severe itching in the throat    Other Other (See Comments)    Mosquito bites cause hives all over body   Aspirin Other (See Comments)    Causes stomach problems   Cefdinir     Other reaction(s): hives   Erythromycin Other (See Comments)    Ate the lining of her stomach   Hctz [Hydrochlorothiazide]     Rash     Penicillins Rash    Has patient had a PCN reaction causing immediate rash, facial/tongue/throat swelling, SOB or lightheadedness with hypotension: Yes Has patient had a PCN reaction causing severe rash involving mucus membranes or skin necrosis: Yes Has patient had a PCN reaction that required hospitalization: No  Has patient had a PCN reaction occurring within the last 10 years: No If all of the above answers are "NO", then may proceed with Cephalosporin use.     FAMILY HISTORY: Family History  Problem Relation Age of Onset   CAD Mother 59       CABG/stents   Breast cancer Mother    Kidney failure Mother    Asthma Father    CAD Father 43   Allergies Father    CAD Brother    CAD Brother 66       9 stents   CAD Brother 5       7 stents   Heart attack Brother       Objective:  Blood pressure 117/62, pulse 90, resp. rate 20, height 4\' 11"  (1.499 m), weight 184 lb (83.5 kg), SpO2 98%. General: No acute distress.  Patient appears well-groomed.   Head:  Normocephalic/atraumatic, tenderness to palpation of left TMJ   Shon Millet, DO  CC: Jarrett Soho, PA-C

## 2023-04-05 ENCOUNTER — Ambulatory Visit (INDEPENDENT_AMBULATORY_CARE_PROVIDER_SITE_OTHER): Payer: Medicare Other | Admitting: Neurology

## 2023-04-05 ENCOUNTER — Encounter: Payer: Self-pay | Admitting: Neurology

## 2023-04-05 VITALS — BP 117/62 | HR 90 | Resp 20 | Ht 59.0 in | Wt 184.0 lb

## 2023-04-05 DIAGNOSIS — G5622 Lesion of ulnar nerve, left upper limb: Secondary | ICD-10-CM | POA: Diagnosis not present

## 2023-04-05 DIAGNOSIS — G629 Polyneuropathy, unspecified: Secondary | ICD-10-CM | POA: Diagnosis not present

## 2023-04-05 DIAGNOSIS — G44321 Chronic post-traumatic headache, intractable: Secondary | ICD-10-CM

## 2023-04-05 MED ORDER — PREGABALIN 50 MG PO CAPS: 50.0000 mg | ORAL_CAPSULE | Freq: Two times a day (BID) | ORAL | 5 refills | Status: DC

## 2023-04-05 NOTE — Patient Instructions (Signed)
Start pregablin 50mg  twice daily.  If no improvement in 4 weeks, contact me and we can increase dose Check MRI of brain with and without contrast Please follow up with your dentist for evaluation of left TMJ dysfunction that may be contributing to your headaches. Ask Dr. Dulce Sellar about Cymbalta Follow up 6 months.

## 2023-04-08 ENCOUNTER — Telehealth: Payer: Self-pay | Admitting: Cardiology

## 2023-04-08 NOTE — Telephone Encounter (Signed)
Name: Kendra Gilbert  DOB: 20-Apr-1956  MRN: [id number]  Primary Cardiologist: Rollene Rotunda, MD   Preoperative team, please contact this patient and set up a phone call appointment for further preoperative risk assessment. Please obtain consent and complete medication review. Thank you for your help. Last seen by Dr. Antoine Poche on 08/30/2022   I confirm that guidance regarding antiplatelet and oral anticoagulation therapy has been completed and, if necessary, noted below. On review of medications, not on anticoagulants or antiplatelets.   I also confirmed the patient resides in the state of [place]. As per [facility] [employer] telemedicine laws, the patient must reside in the state in which the provider is licensed.   Joni Reining, NP 04/08/2023, 4:18 PM [facility]

## 2023-04-08 NOTE — Telephone Encounter (Signed)
Pre-operative Risk Assessment    Patient Name: Kendra Gilbert  DOB: 04-25-1956 MRN: [id number]  Last visit 08-30-22 Next visit None     Request for Surgical Clearance    Procedure:   right thumb carpometacarpal arthroplasty and tendon transfer  Date of Surgery:  Clearance 04/20/23                                 Surgeon:  Dr. Bradly Bienenstock Surgeon's Group or Practice Name:  [facility] Phone number:  [phone] Surgery schedular [place] [phone] Fax number:  [phone]   Type of Clearance Requested:   - Medical    Type of Anesthesia:  Not Indicated   Additional requests/questions:    Sharen Hones   04/08/2023, 3:25 PM

## 2023-04-08 NOTE — Telephone Encounter (Signed)
Left message to call back to add on to tele pre op appt. Ok per Joni Reining, DNP to add on due to procedure date.

## 2023-04-11 ENCOUNTER — Telehealth: Payer: Self-pay | Admitting: *Deleted

## 2023-04-11 ENCOUNTER — Other Ambulatory Visit: Payer: Self-pay | Admitting: Allergy

## 2023-04-11 NOTE — Telephone Encounter (Signed)
Pt has been scheduled as add on ok per Joni Reining, DNP due to no open slots and procedure date. Med rec and consent are done.     Patient Consent for Virtual Visit        Kendra Gilbert has provided verbal consent on 04/11/2023 for a virtual visit (video or telephone).   CONSENT FOR VIRTUAL VISIT FOR:  Kendra Gilbert  By participating in this virtual visit I agree to the following:  I hereby voluntarily request, consent and authorize [facility] and its employed or contracted physicians, physician assistants, nurse practitioners or other licensed health care professionals (the Practitioner), to provide me with telemedicine health care services (the "Services") as deemed necessary by the treating Practitioner. I acknowledge and consent to receive the Services by the Practitioner via telemedicine. I understand that the telemedicine visit will involve communicating with the Practitioner through live audiovisual communication technology and the disclosure of certain medical information by electronic transmission. I acknowledge that I have been given the opportunity to request an in-person assessment or other available alternative prior to the telemedicine visit and am voluntarily participating in the telemedicine visit.  I understand that I have the right to withhold or withdraw my consent to the use of telemedicine in the course of my care at any time, without affecting my right to future care or treatment, and that the Practitioner or I may terminate the telemedicine visit at any time. I understand that I have the right to inspect all information obtained and/or recorded in the course of the telemedicine visit and may receive copies of available information for a reasonable fee.  I understand that some of the potential risks of receiving the Services via telemedicine include:  Delay or interruption in medical evaluation due to technological equipment failure or  disruption; Information transmitted may not be sufficient (e.g. poor resolution of images) to allow for appropriate medical decision making by the Practitioner; and/or  In rare instances, security protocols could fail, causing a breach of personal health information.  Furthermore, I acknowledge that it is my responsibility to provide information about my medical history, conditions and care that is complete and accurate to the best of my ability. I acknowledge that Practitioner's advice, recommendations, and/or decision may be based on factors not within their control, such as incomplete or inaccurate data provided by me or distortions of diagnostic images or specimens that may result from electronic transmissions. I understand that the practice of medicine is not an exact science and that Practitioner makes no warranties or guarantees regarding treatment outcomes. I acknowledge that a copy of this consent can be made available to me via my patient portal [facility] MyChart), or I can request a printed copy by calling the office of [facility].    I understand that my insurance will be billed for this visit.   I have read or had this consent read to me. I understand the contents of this consent, which adequately explains the benefits and risks of the Services being provided via telemedicine.  I have been provided ample opportunity to ask questions regarding this consent and the Services and have had my questions answered to my satisfaction. I give my informed consent for the services to be provided through the use of telemedicine in my medical care

## 2023-04-11 NOTE — Telephone Encounter (Signed)
Pt has been scheduled as add on ok per Joni Reining, DNP due to no open slots and procedure date. Med rec and consent are done.

## 2023-04-12 ENCOUNTER — Ambulatory Visit: Payer: Medicare Other | Attending: Internal Medicine | Admitting: Internal Medicine

## 2023-04-12 ENCOUNTER — Encounter: Payer: Self-pay | Admitting: Internal Medicine

## 2023-04-12 VITALS — BP 105/62 | HR 90 | Resp 15 | Ht 59.0 in | Wt 184.0 lb

## 2023-04-12 DIAGNOSIS — T148XXA Other injury of unspecified body region, initial encounter: Secondary | ICD-10-CM | POA: Diagnosis present

## 2023-04-12 DIAGNOSIS — M154 Erosive (osteo)arthritis: Secondary | ICD-10-CM | POA: Diagnosis present

## 2023-04-12 DIAGNOSIS — I872 Venous insufficiency (chronic) (peripheral): Secondary | ICD-10-CM | POA: Diagnosis present

## 2023-04-12 DIAGNOSIS — M25572 Pain in left ankle and joints of left foot: Secondary | ICD-10-CM

## 2023-04-13 LAB — CBC WITH DIFFERENTIAL/PLATELET
Absolute Monocytes: 475 {cells}/uL (ref 200–950)
Basophils Absolute: 19 {cells}/uL (ref 0–200)
Basophils Relative: 0.2 %
Eosinophils Absolute: 38 {cells}/uL (ref 15–500)
Eosinophils Relative: 0.4 %
HCT: 37.7 % (ref 35.0–45.0)
Hemoglobin: 11.8 g/dL (ref 11.7–15.5)
Lymphs Abs: 1435 {cells}/uL (ref 850–3900)
MCH: 26.7 pg — ABNORMAL LOW (ref 27.0–33.0)
MCHC: 31.3 g/dL — ABNORMAL LOW (ref 32.0–36.0)
MCV: 85.3 fL (ref 80.0–100.0)
MPV: 11.6 fL (ref 7.5–12.5)
Monocytes Relative: 5 %
Neutro Abs: 7534 {cells}/uL (ref 1500–7800)
Neutrophils Relative %: 79.3 %
Platelets: 218 10*3/uL (ref 140–400)
RBC: 4.42 10*6/uL (ref 3.80–5.10)
RDW: 13.5 % (ref 11.0–15.0)
Total Lymphocyte: 15.1 %
WBC: 9.5 10*3/uL (ref 3.8–10.8)

## 2023-04-13 LAB — COMPLETE METABOLIC PANEL WITH GFR
AG Ratio: 1.1 (calc) (ref 1.0–2.5)
ALT: 21 U/L (ref 6–29)
AST: 22 U/L (ref 10–35)
Albumin: 3.9 g/dL (ref 3.6–5.1)
Alkaline phosphatase (APISO): 143 U/L (ref 37–153)
BUN/Creatinine Ratio: 26 (calc) — ABNORMAL HIGH (ref 6–22)
BUN: 28 mg/dL — ABNORMAL HIGH (ref 7–25)
CO2: 29 mmol/L (ref 20–32)
Calcium: 9.6 mg/dL (ref 8.6–10.4)
Chloride: 104 mmol/L (ref 98–110)
Creat: 1.06 mg/dL — ABNORMAL HIGH (ref 0.50–1.05)
Globulin: 3.6 g/dL (ref 1.9–3.7)
Glucose, Bld: 102 mg/dL — ABNORMAL HIGH (ref 65–99)
Potassium: 3.6 mmol/L (ref 3.5–5.3)
Sodium: 143 mmol/L (ref 135–146)
Total Bilirubin: 0.6 mg/dL (ref 0.2–1.2)
Total Protein: 7.5 g/dL (ref 6.1–8.1)
eGFR: 58 mL/min/{1.73_m2} — ABNORMAL LOW (ref >=60)

## 2023-04-13 LAB — PROTIME-INR
INR: 1
Prothrombin Time: 10.4 s (ref 9.0–11.5)

## 2023-04-13 LAB — PROTEIN / CREATININE RATIO, URINE
Creatinine, Urine: 157 mg/dL (ref 20–275)
Protein/Creat Ratio: 134 mg/g{creat} (ref 24–184)
Protein/Creatinine Ratio: 0.134 mg/mg{creat} (ref 0.024–0.184)
Total Protein, Urine: 21 mg/dL (ref 5–24)

## 2023-04-13 LAB — C-REACTIVE PROTEIN: CRP: 10.8 mg/L — ABNORMAL HIGH (ref <8.0)

## 2023-04-13 LAB — SEDIMENTATION RATE: Sed Rate: 125 mm/h — ABNORMAL HIGH (ref 0–30)

## 2023-04-14 ENCOUNTER — Ambulatory Visit: Payer: Medicare Other | Attending: Cardiovascular Disease | Admitting: Nurse Practitioner

## 2023-04-14 ENCOUNTER — Ambulatory Visit: Payer: Medicare Other | Admitting: Internal Medicine

## 2023-04-14 DIAGNOSIS — Z01810 Encounter for preprocedural cardiovascular examination: Secondary | ICD-10-CM | POA: Diagnosis not present

## 2023-04-14 NOTE — Progress Notes (Signed)
Virtual Visit via Telephone Note   Because of Kendra Gilbert's co-morbid illnesses, she is at least at moderate risk for complications without adequate follow up.  This format is felt to be most appropriate for this patient at this time.  The patient did not have access to video technology/had technical difficulties with video requiring transitioning to audio format only (telephone).  All issues noted in this document were discussed and addressed.  No physical exam could be performed with this format.  Please refer to the patient's chart for her consent to telehealth for [facility].  Evaluation Performed:  Preoperative cardiovascular risk assessment _____________   Date:  04/14/2023   Patient ID:  Kendra Gilbert, Kendra Gilbert 1955-09-09, MRN [id number] Patient Location:  Home Provider location:   Office  Primary Care Provider:  Jarrett Soho, PA-C Primary Cardiologist:  Rollene Rotunda, MD  Chief Complaint / Patient Profile   67 y.o. y/o female with a h/o CAD, hypertension, hyperlipidemia, GERD, asthma and rheumatoid arthritis who is pending right thumb carpometacarpal arthroplasty and tendon transfer with Dr. Bradly Bienenstock of [facility] and presents today for telephonic preoperative cardiovascular risk assessment.  History of Present Illness    Kendra Gilbert is a 67 y.o. female who presents via audio/video conferencing for a telehealth visit today.  Pt was last seen in cardiology clinic on 08/30/2022 by Dr. Antoine Poche. At that time Hennesy Icenhower was doing well.  The patient is now pending procedure as outlined above. Since her last visit, she has been stable from a cardiac standpoint. She has mild dyspnea with exertion (when walking up a hill), unchanged from prior visits.  She has intermittent bilateral lower extremity edema, controlled with as needed Lasix.  She denies chest pain, palpitations, pnd, orthopnea, n, v, dizziness, syncope, weight gain, or early satiety.  All other systems reviewed and are otherwise negative except as noted above.   Past Medical History    Past Medical History:  Diagnosis Date   Asthma    CAD (coronary artery disease)    Moderate, nonobstructive CAD as seen on coronary CTA in 2020 with normal FFR.   Cataracts, bilateral    Chronic kidney disease    cyst in left kidney   Class 2 obesity    Colon cancer [facility]) 2017   surgical tx  removal of polyp   Concussion 04/20/2021   Coronary artery calcification    a. POET in 2015 with no acute findings.   Diarrhea since 02-16-17   c dif test normal per pt   Dyspnea     with exertion, occ at rest no oxygen or inhaler use   GERD (gastroesophageal reflux disease)    H/O: rheumatic fever as child   Headache    occ left side migraine   Hyperlipidemia    Hypertension    Leg edema    Lung disease    Lung nodule   Non-alcoholic cirrhosis (HCC) 2013   no current issues with   Pneumonia 2016   Rheumatoid arthritis (HCC)    oa also, back arthritis, hx rheumatoid    Past Surgical History:  Procedure Laterality Date   ABDOMINAL HYSTERECTOMY     ovaries left   ANKLE SURGERY Left 02/12/2021   COLONOSCOPY WITH PROPOFOL N/A 03/23/2017   Procedure: COLONOSCOPY WITH PROPOFOL;  Surgeon: Willis Modena, MD;  Location: [facility];  Service: Endoscopy;  Laterality: N/A;   colonscopy  2017   DILATION AND CURETTAGE OF UTERUS     few done for endometriosis  GASTROC RECESSION EXTREMITY Left 02/12/2021   Procedure: Left gastroc recession;  Surgeon: Toni Arthurs, MD;  Location: [facility];  Service: Orthopedics;  Laterality: Left;   ROTATOR CUFF REPAIR Left 09/2021   athroscopic and open    Allergies  Allergies  Allergen Reactions   Amlodipine Shortness Of Breath and Swelling   Bee Venom Anaphylaxis and Hives   Lisinopril Itching and Cough    Dry cough and severe itching in the throat    Other Other (See Comments)    Mosquito bites cause hives all over body    Aspirin Other (See Comments)    Causes stomach problems   Cefdinir     Other reaction(s): hives   Erythromycin Other (See Comments)    Ate the lining of her stomach   Hctz [Hydrochlorothiazide]     Rash    Penicillins Rash    Has patient had a PCN reaction causing immediate rash, facial/tongue/throat swelling, SOB or lightheadedness with hypotension: Yes Has patient had a PCN reaction causing severe rash involving mucus membranes or skin necrosis: Yes Has patient had a PCN reaction that required hospitalization: No  Has patient had a PCN reaction occurring within the last 10 years: No If all of the above answers are "NO", then may proceed with Cephalosporin use.     Home Medications    Prior to Admission medications   Medication Sig Start Date End Date Taking? Authorizing Provider  acetaminophen (TYLENOL) 500 MG tablet Take 325 mg by mouth daily as needed for mild pain, fever or headache.    [provider]  ARNUITY ELLIPTA 200 MCG/ACT AEPB Inhale 1 puff into the lungs daily. 10/21/22   Marcelyn Bruins, MD  azelastine (OPTIVAR) 0.05 % ophthalmic solution Place 1 drop into both eyes 2 (two) times daily as needed. 07/21/22   Marcelyn Bruins, MD  Budeson-Glycopyrrol-Formoterol (BREZTRI AEROSPHERE) 160-9-4.8 MCG/ACT AERO INHALE 2 PUFFS TWICE DAILY TO PREVENT COUGH OR WHEEZE, RINSE MOUTH AFTER USE. 09/27/22   Marcelyn Bruins, MD  diphenhydrAMINE (BENADRYL) 25 MG tablet Take 25 mg by mouth every 6 (six) hours as needed.    [provider]  EPINEPHrine 0.3 mg/0.3 mL IJ SOAJ injection Inject 0.3 mg into the muscle as needed for anaphylaxis. 12/03/22   Marcelyn Bruins, MD  furosemide (LASIX) 40 MG tablet Take 1 tablet (40 mg total) by mouth daily. Take 1 tablet daily as needed for weight gain of 3 lb overnight and 5 lb in 1 week 08/30/22   Rollene Rotunda, MD  ipratropium-albuterol (DUONEB) 0.5-2.5 (3) MG/3ML SOLN Use 1 vial via nebulizer every  4-6 hours as needed for cough, wheeze, shortness of breath or chest tightness Patient not taking: Reported on 04/12/2023 11/13/21   Marcelyn Bruins, MD  isosorbide mononitrate (IMDUR) 30 MG 24 hr tablet Take 1 tablet (30 mg total) by mouth daily. 08/30/22   Rollene Rotunda, MD  levocetirizine (XYZAL) 5 MG tablet TAKE 1 TABLET(5 MG) BY MOUTH EVERY EVENING 04/11/23   Padgett, Pilar Grammes, MD  losartan (COZAAR) 25 MG tablet TAKE 1 TABLET(25 MG) BY MOUTH IN THE MORNING AND AT BEDTIME 10/25/22   Rollene Rotunda, MD  nitroGLYCERIN (NITROSTAT) 0.4 MG SL tablet Place 1 tablet (0.4 mg total) under the tongue every 5 (five) minutes as needed for chest pain. Patient not taking: Reported on 08/30/2022 04/28/22 07/27/22  Sharlene Dory, NP  pantoprazole (PROTONIX) 20 MG tablet Take 40 mg by mouth daily. 10/27/21   [provider]  pregabalin (LYRICA) 50 MG capsule Take 1 capsule (50 mg total) by mouth 2 (two) times daily. 04/05/23   Jaffe, Adam R, DO  RESTASIS 0.05 % ophthalmic emulsion 1 drop 2 (two) times daily. 03/22/22   [provider]  rosuvastatin (CRESTOR) 20 MG tablet Take 1 tablet (20 mg total) by mouth daily. 06/07/22   Joylene Grapes, NP  Sodium Chloride-Sodium Bicarb 1.57 g PACK Place into the nose.    [provider]  VENTOLIN HFA 108 (90 Base) MCG/ACT inhaler Inhale 2 puffs into the lungs every 4 (four) hours as needed for wheezing or shortness of breath. 01/14/22   Marcelyn Bruins, MD    Physical Exam    Vital Signs:  Tronda Relford does not have vital signs available for review today.  Given telephonic nature of communication, physical exam is limited. AAOx3. NAD. Normal affect.  Speech and respirations are unlabored.  Accessory Clinical Findings    None  Assessment & Plan    1.  Preoperative Cardiovascular Risk Assessment:  According to the Revised Cardiac Risk Index (RCRI), her Perioperative Risk of Major Cardiac Event is (%): 0.4. Her  Functional Capacity in METs is: 4.4 according to the [facility] Activity Status Index (DASI).Therefore, based on ACC/AHA guidelines, patient would be at acceptable risk for the planned procedure without further cardiovascular testing.  The patient was advised that if she develops new symptoms prior to surgery to contact our office to arrange for a follow-up visit, and she verbalized understanding.   A copy of this note will be routed to requesting surgeon.  Time:   Today, I have spent 5 minutes with the patient with telehealth technology discussing medical history, symptoms, and management plan.     Joylene Grapes, NP  04/14/2023, 3:27 PM

## 2023-04-20 HISTORY — PX: OTHER SURGICAL HISTORY: SHX169

## 2023-04-26 ENCOUNTER — Other Ambulatory Visit: Payer: Self-pay | Admitting: Allergy

## 2023-04-27 ENCOUNTER — Ambulatory Visit
Admission: RE | Admit: 2023-04-27 | Discharge: 2023-04-27 | Disposition: A | Discharge status: Home / Self Care | Payer: Medicare Other | Source: Ambulatory Visit | Attending: Family Medicine | Admitting: Family Medicine

## 2023-04-27 DIAGNOSIS — Z1231 Encounter for screening mammogram for malignant neoplasm of breast: Secondary | ICD-10-CM

## 2023-05-06 ENCOUNTER — Ambulatory Visit
Admission: RE | Admit: 2023-05-06 | Discharge: 2023-05-06 | Disposition: A | Discharge status: Home / Self Care | Payer: Medicare Other | Source: Ambulatory Visit | Attending: Neurology

## 2023-05-06 DIAGNOSIS — G44321 Chronic post-traumatic headache, intractable: Secondary | ICD-10-CM

## 2023-05-06 MED ORDER — GADOPICLENOL 0.5 MMOL/ML IV SOLN
8.0000 mL | Freq: Once | INTRAVENOUS | Status: AC | PRN
  Administered 2023-05-06: 8 mL via INTRAVENOUS

## 2023-05-06 NOTE — Progress Notes (Signed)
Tried calling patient no answer. LMOVM for patient to call back.

## 2023-05-09 ENCOUNTER — Telehealth: Payer: Self-pay | Admitting: Neurology

## 2023-05-09 NOTE — Progress Notes (Signed)
Patient advised.

## 2023-05-09 NOTE — Telephone Encounter (Signed)
Patient called returning phone call from Friday regarding MRI results. Please call patient

## 2023-05-09 NOTE — Telephone Encounter (Signed)
LMOVm for patient call the office back.

## 2023-05-09 NOTE — Telephone Encounter (Signed)
[facility] radiology called and states have called on Friday and today. Erskine Squibb called and states even if report was received they need to speak with a Nurse or MA to document that she called and read results. She states radiologist told them to call to give results. Please call back ASAP [phone]

## 2023-05-09 NOTE — Telephone Encounter (Signed)
Tried calling back on hold for twenty minutes. Had to hang up.

## 2023-05-20 ENCOUNTER — Ambulatory Visit (INDEPENDENT_AMBULATORY_CARE_PROVIDER_SITE_OTHER): Payer: Medicare Other | Admitting: Allergy

## 2023-05-20 ENCOUNTER — Other Ambulatory Visit: Payer: Self-pay

## 2023-05-20 ENCOUNTER — Encounter: Payer: Self-pay | Admitting: Allergy

## 2023-05-20 VITALS — BP 114/62 | HR 105 | Temp 98.0°F | Resp 22

## 2023-05-20 DIAGNOSIS — H1012 Acute atopic conjunctivitis, left eye: Secondary | ICD-10-CM

## 2023-05-20 DIAGNOSIS — J4550 Severe persistent asthma, uncomplicated: Secondary | ICD-10-CM | POA: Diagnosis not present

## 2023-05-20 DIAGNOSIS — J3089 Other allergic rhinitis: Secondary | ICD-10-CM

## 2023-05-20 DIAGNOSIS — J300 Vasomotor rhinitis: Secondary | ICD-10-CM

## 2023-05-20 NOTE — Patient Instructions (Addendum)
-   continue avoidance measures for grass pollens, weed pollens, tree pollens, mold, dust mites. - recommend Allegra 1 tab daily. Can take additional 1/2 tab to 1 full tab for total daily dose of 2 Allegras if needed to help combat the drainage.   - try use of Astelin 1 spray each nostril daily as needed for runny nose to start off and see if can tolerate use.      - use Bepreve 1 drop each eye twice a day as needed for itchy/watery eyes.   - have access to albuterol inhaler 2 puffs or albuterol 1 vial via nebulizer every 4-6 hours as needed for cough/wheeze/shortness of breath/chest tightness.  May use 15-20 minutes prior to activity.   Monitor frequency of use.    Use inhaler with spacer device.   - continue Breztri 2 puffs twice a day.  - continue Arnuity 1 puff daily.  Recommend changing to morning dose to help with symptoms with morning outdoor activity.     Asthma control goals:  Full participation in all desired activities (may need albuterol before activity) Albuterol use two time or less a w eek on average (not counting use with activity) Cough interfering with sleep two time or less a month Oral steroids no more than once a year No hospitalizations   Follow-up in 6 months or sooner if needed

## 2023-05-20 NOTE — Progress Notes (Signed)
Follow-up Note  RE: Kendra Gilbert MRN: [id number] DOB: 10-16-1955 Date of Office Visit: 05/20/2023   History of present illness: Kendra Gilbert is a 67 y.o. female presenting today for follow-up of severe persistent asthma, allergic rhinitis with conjunctivitis and vasomotor rhinitis.  She was last seen in the office on 11/26/2022 by myself.  Discussed the use of AI scribe software for clinical note transcription with the patient, who gave verbal consent to proceed.  She did have surgery on her right arm since the last visit and is currently in a arm cast with sling.  She has been having right-sided back and chest pain. The pain, which is constant and exacerbated by activity. The patient reports that the pain is severe enough to interfere with coughing and is not relieved by rest.  In addition to the pain, the patient has been experiencing difficulty breathing, particularly when engaging in outdoor activities. The patient describes a sensation of bloating in the legs, which feel heavy and she feels contribute to the breathing difficulty. This symptom is sudden in onset and only occurs when the patient is outdoors. The patient's current medication regimen includes Breztri, taken to times daily, and Arnuity, taken in the evening. Despite this, the patient reports that the rescue inhaler does not alleviate the breathing difficulty when going outside.  She does use the rescue inhaler prior to the doctor activity as well. The patient also reports nasal congestion, which she attributes to her current allergy medication, Xyzal. The patient has previously tried Allegra, which resulted in a runny nose but kept the nasal passages open.  She is not sure if she would prefer having the congestion over the runny nose and feels like maybe the Allegra would be a better antihistamine at this time.  She has not tried use of Astelin or any nasal spray yet. The patient's arthritis is managed by another Dr. Dimple Casey,  and the patient has not yet discussed the persistent pain with them.  She states she will be in the cast for another week or so and then she will have to use the sling.  While casted she states she can be without the sling when she is home.    Review of systems: 10pt ROS negative unless noted above in HPI  All other systems negative unless noted above in HPI  Past medical/social/surgical/family history have been reviewed and are unchanged unless specifically indicated below.  No changes  Medication List: Current Outpatient Medications  Medication Sig Dispense Refill   acetaminophen (TYLENOL) 500 MG tablet Take 325 mg by mouth daily as needed for mild pain, fever or headache.     ARNUITY ELLIPTA 200 MCG/ACT AEPB INHALE 1 PUFF INTO THE LUNGS DAILY 30 each 5   azelastine (OPTIVAR) 0.05 % ophthalmic solution Place 1 drop into both eyes 2 (two) times daily as needed. 6 mL 5   Budeson-Glycopyrrol-Formoterol (BREZTRI AEROSPHERE) 160-9-4.8 MCG/ACT AERO INHALE 2 PUFFS TWICE DAILY TO PREVENT COUGH OR WHEEZE, RINSE MOUTH AFTER USE. 10.7 g 5   diphenhydrAMINE (BENADRYL) 25 MG tablet Take 25 mg by mouth every 6 (six) hours as needed.     EPINEPHrine 0.3 mg/0.3 mL IJ SOAJ injection Inject 0.3 mg into the muscle as needed for anaphylaxis. 2 each 1   furosemide (LASIX) 40 MG tablet Take 1 tablet (40 mg total) by mouth daily. Take 1 tablet daily as needed for weight gain of 3 lb overnight and 5 lb in 1 week 90 tablet 3   ipratropium-albuterol (  DUONEB) 0.5-2.5 (3) MG/3ML SOLN Use 1 vial via nebulizer every 4-6 hours as needed for cough, wheeze, shortness of breath or chest tightness 180 mL 1   isosorbide mononitrate (IMDUR) 30 MG 24 hr tablet Take 1 tablet (30 mg total) by mouth daily. 90 tablet 3   levocetirizine (XYZAL) 5 MG tablet TAKE 1 TABLET(5 MG) BY MOUTH EVERY EVENING 90 tablet 1   losartan (COZAAR) 25 MG tablet TAKE 1 TABLET(25 MG) BY MOUTH IN THE MORNING AND AT BEDTIME 180 tablet 3   pantoprazole  (PROTONIX) 20 MG tablet Take 40 mg by mouth daily.     pregabalin (LYRICA) 50 MG capsule Take 1 capsule (50 mg total) by mouth 2 (two) times daily. 60 capsule 5   RESTASIS 0.05 % ophthalmic emulsion 1 drop 2 (two) times daily.     rosuvastatin (CRESTOR) 20 MG tablet Take 1 tablet (20 mg total) by mouth daily. 90 tablet 3   Sodium Chloride-Sodium Bicarb 1.57 g PACK Place into the nose.     VENTOLIN HFA 108 (90 Base) MCG/ACT inhaler Inhale 2 puffs into the lungs every 4 (four) hours as needed for wheezing or shortness of breath. 18 g 1   nitroGLYCERIN (NITROSTAT) 0.4 MG SL tablet Place 1 tablet (0.4 mg total) under the tongue every 5 (five) minutes as needed for chest pain. (Patient not taking: Reported on 08/30/2022) 25 tablet 3   No current facility-administered medications for this visit.     Known medication allergies: Allergies  Allergen Reactions   Amlodipine Shortness Of Breath and Swelling   Bee Venom Anaphylaxis and Hives   Lisinopril Itching and Cough    Dry cough and severe itching in the throat    Other Other (See Comments)    Mosquito bites cause hives all over body   Aspirin Other (See Comments)    Causes stomach problems   Cefdinir     Other reaction(s): hives   Erythromycin Other (See Comments)    Ate the lining of her stomach   Hctz [Hydrochlorothiazide]     Rash    Penicillins Rash    Has patient had a PCN reaction causing immediate rash, facial/tongue/throat swelling, SOB or lightheadedness with hypotension: Yes Has patient had a PCN reaction causing severe rash involving mucus membranes or skin necrosis: Yes Has patient had a PCN reaction that required hospitalization: No  Has patient had a PCN reaction occurring within the last 10 years: No If all of the above answers are "NO", then may proceed with Cephalosporin use.      Physical examination: Blood pressure 114/62, pulse (!) 105, temperature 98 F (36.7 C), temperature source Temporal, resp. rate (!) 22,  SpO2 96%.  General: Alert, interactive, in no acute distress. HEENT: PERRLA, TMs pearly gray, turbinates minimally edematous without discharge, post-pharynx non erythematous. Neck: Supple without lymphadenopathy. Lungs: Clear to auscultation without wheezing, rhonchi or rales. {no increased work of breathing. CV: Normal S1, S2 without murmurs. Abdomen: Nondistended, nontender. Skin: Warm and dry, without lesions or rashes. Extremities:  No clubbing, cyanosis or edema.  Right arm casted in a sling Neuro:   Grossly intact.  Diagnositics/Labs: None today  Assessment and plan: Severe persistent asthma Allergic rhinitis with conjunctivitis Vasomotor rhinitis  - continue avoidance measures for grass pollens, weed pollens, tree pollens, mold, dust mites. - recommend Allegra 1 tab daily. Can take additional 1/2 tab to 1 full tab for total daily dose of 2 Allegras if needed to help combat the drainage.   -  try use of Astelin 1 spray each nostril daily as needed for runny nose to start off and see if can tolerate use.      - use Bepreve 1 drop each eye twice a day as needed for itchy/watery eyes.   - have access to albuterol inhaler 2 puffs or albuterol 1 vial via nebulizer every 4-6 hours as needed for cough/wheeze/shortness of breath/chest tightness.  May use 15-20 minutes prior to activity.   Monitor frequency of use.    Use inhaler with spacer device.   - continue Breztri 2 puffs twice a day.  - continue Arnuity 1 puff daily.  Recommend changing to morning dose to help with symptoms with morning outdoor activity.     Asthma control goals:  Full participation in all desired activities (may need albuterol before activity) Albuterol use two time or less a w eek on average (not counting use with activity) Cough interfering with sleep two time or less a month Oral steroids no more than once a year No hospitalizations   Follow-up in 6 months or sooner if needed  I appreciate the  opportunity to take part in Orlinda's care. Please do not hesitate to contact me with questions.  Sincerely,   Margo Aye, MD Allergy/Immunology [facility]

## 2023-05-27 ENCOUNTER — Other Ambulatory Visit: Payer: Self-pay | Admitting: Allergy

## 2023-06-17 ENCOUNTER — Other Ambulatory Visit: Payer: Self-pay | Admitting: Nurse Practitioner

## 2023-08-05 ENCOUNTER — Telehealth: Payer: Self-pay | Admitting: Allergy

## 2023-08-05 NOTE — Telephone Encounter (Signed)
Patient called and stated that due to her insurance, the Budeson-Glycopyrrol-Formoterol (BREZTRI AEROSPHERE) is no longer being covered by her insurance.Patient stated the Alternative would be covered is the Trelegy Powder inhalation. She stated she is good on Breztri for now as she just picked up the last refill today. She also wanted to inform that if she does need the [facility], She stated she has a notice from the insurance to appeal the medication for her. She stated she can drop that off for Dr.Padgett as well.

## 2023-08-08 ENCOUNTER — Telehealth: Payer: Self-pay | Admitting: Allergy

## 2023-08-08 MED ORDER — VENTOLIN HFA 108 (90 BASE) MCG/ACT IN AERS: 2.0000 | INHALATION_SPRAY | RESPIRATORY_TRACT | 0 refills | Status: AC | PRN

## 2023-08-08 NOTE — Telephone Encounter (Signed)
Patient came in and stated she need a refill on her Ventolin inhaler. She wants that sent over to the [facility] on [address]  Best Contact: [phone]

## 2023-08-08 NOTE — Telephone Encounter (Signed)
Called and left a message informing patient to call our office back to express that the albuterol refill was approved and to keep scheduled appointment.

## 2023-08-08 NOTE — Addendum Note (Signed)
Addended by: Robet Leu A on: 08/08/2023 10:45 AM   Modules accepted: Orders

## 2023-08-09 MED ORDER — TRELEGY ELLIPTA 200-62.5-25 MCG/ACT IN AEPB: 1.0000 | INHALATION_SPRAY | Freq: Every morning | RESPIRATORY_TRACT | 0 refills | Status: DC

## 2023-08-09 NOTE — Addendum Note (Signed)
Addended by: Ralene Muskrat on: 08/09/2023 07:32 PM   Modules accepted: Orders

## 2023-08-09 NOTE — Telephone Encounter (Signed)
Lm for pt to call us back about this change in medications

## 2023-08-10 NOTE — Telephone Encounter (Signed)
 Dacey called back and states she is concerned about Trelegy because she is already on a powdered inhaler and doesn't want to be on 2.  Is there another alternative?  I spoke with her about the other telephone contact and let her know the Albuterol  was approved.

## 2023-08-11 NOTE — Telephone Encounter (Addendum)
 Patient states on Monday she brought a sheet with all the information to do an appeal. Patient states it has the address and phone number and instructions with what to do. Patient states the front desk made a copy of the paper. Patient states she does not want a powder inhaler.

## 2023-08-11 NOTE — Telephone Encounter (Signed)
 Forwarding message to PA Team to initiate Prior Auth process.

## 2023-08-11 NOTE — Telephone Encounter (Signed)
 Per Provider:  That's the only comparable alternative to Breztri .  Would can do the appeal if she can provide the contact information for the appeal.   Called patient - DOB/NEED Updated DPR - LMOVM to contact office regarding her provider response.  If/When call back - please advise of provider notation.

## 2023-08-12 ENCOUNTER — Telehealth: Payer: Self-pay

## 2023-08-12 ENCOUNTER — Other Ambulatory Visit (HOSPITAL_COMMUNITY): Payer: Self-pay

## 2023-08-12 NOTE — Telephone Encounter (Signed)
 PA request has been Submitted. New Encounter created for follow up. For additional info see Pharmacy Prior Auth telephone encounter from 08-12-2023.

## 2023-08-12 NOTE — Telephone Encounter (Signed)
 Pharmacy Patient Advocate Encounter   Received notification from Pt Calls Messages that prior authorization for Breztri  Aerosphere 160-9-4.8MCG/ACT aerosol is required/requested.   Insurance verification completed.   The patient is insured through [place] .   Per test claim: PA required; PA submitted to above mentioned insurance via CoverMyMeds Key/confirmation #/EOC [employer] Status is pending

## 2023-08-15 NOTE — Telephone Encounter (Signed)
 Pharmacy Patient Advocate Encounter  Received notification from [employer] that Prior Authorization for Breztri  Aerosphere 160-9-4.8MCG/ACT aerosol has been APPROVED from 08-12-2023 to 07-03-2024   PA #/Case ID/Reference #: Jennetta Modena

## 2023-08-15 NOTE — Telephone Encounter (Signed)
 I called patient and left a message to callback to inform of approval.

## 2023-08-16 NOTE — Telephone Encounter (Signed)
I called patient and left a message to call the office back.

## 2023-08-18 NOTE — Telephone Encounter (Signed)
I called patient and left a message to call the office back to inform of [facility] approval.

## 2023-09-06 DIAGNOSIS — R072 Precordial pain: Secondary | ICD-10-CM | POA: Insufficient documentation

## 2023-09-06 NOTE — Progress Notes (Unsigned)
  Cardiology Office Note:   Date:  09/08/2023  ID:  Kendra Gilbert, DOB 07/14/1955, MRN [id number] PCP: Jarrett Soho, PA-C  [facility] Providers Cardiologist:  Rollene Rotunda, MD {  History of Present Illness:   Kendra Gilbert is a 68 y.o. female who presents for follow up of HTN.  She was intolerant of lisinopril which caused some cough.  We switched her to Cozaar.   She also has nonobstructive coronary disease.  In 2020 she had a CT coronary angiogram that demonstrated some left main calcification.  The LAD had proximal 50 to 69% stenosis.  She was having more shortness of breath.  Her Lexiscan Myoview was negative in Feb 2022.   She had continued symptoms.  Coronary CTA in 04/2022 showed calcium score 331 (92nd percentile), moderate CAD in the proximal LAD, FFR equals 0.92, low likelihood of hemodynamic significance.    She has had continued chest pain that was managed with Imdur.    She has had no new cardiovascular problems since I last saw her.  She has had lots of shortness of breath and is being treated multiple allergies.  She gets dyspneic climbing up stairs.  She is not having any new chest pressure and has not had to take nitroglycerin for now.  She does complain of lower extremity swelling and occasionally takes a diuretic.  However, she cannot take this every day as it makes her fatigued.  She has problems wearing her compression stockings.  ROS: As stated in the HPI and negative for all other systems.  Studies Reviewed:    EKG:   NA  Risk Assessment/Calculations:              Physical Exam:   VS:  BP 122/78   Pulse 79   Ht 4\' 11"  (1.499 m)   Wt 185 lb 9.6 oz (84.2 kg)   SpO2 99%   BMI 37.49 kg/m    Wt Readings from Last 3 Encounters:  09/08/23 185 lb 9.6 oz (84.2 kg)  09/07/23 186 lb 9.6 oz (84.6 kg)  04/12/23 184 lb (83.5 kg)     GEN: Well nourished, well developed in no acute distress NECK: No JVD; No carotid bruits CARDIAC: RRR, no murmurs,  rubs, gallops RESPIRATORY:  Clear to auscultation without rales, positive wheezing ABDOMEN: Soft, non-tender, non-distended EXTREMITIES:  No edema; No deformity   ASSESSMENT AND PLAN:   Chest pain:    She has non obstructive CAD.  She has no new symptoms.  No change in therapy.  Leg swelling:   We have talked again about as needed dosing of her diuretic.  I have no new suggestions.  And not suspecting heart failure.    HTN: Her blood pressure is at target.  No change in therapy.  At target.  No change in therapy.    Dyslipidemia: LDL was 63 with an HDL of 68.  No change in therapy.    Follow up with me in one year.   Signed, Rollene Rotunda, MD

## 2023-09-07 ENCOUNTER — Ambulatory Visit: Payer: Medicaid Other | Admitting: Pulmonary Disease

## 2023-09-07 ENCOUNTER — Encounter: Payer: Self-pay | Admitting: Pulmonary Disease

## 2023-09-07 VITALS — BP 116/70 | HR 105 | Ht 59.0 in | Wt 186.6 lb

## 2023-09-07 DIAGNOSIS — J4550 Severe persistent asthma, uncomplicated: Secondary | ICD-10-CM

## 2023-09-07 NOTE — Progress Notes (Signed)
 @Patient  ID: Kendra Gilbert, female    DOB: 08/19/1955, 68 y.o.   MRN: [id number]  Chief Complaint  Patient presents with   Follow-up    Pt states SOB, coughing    Referring provider: Jarrett Soho, PA-C  HPI:   68 y.o. woman whom we are seeing in evaluation of dyspnea on exertion in the setting of severe severe persistent asthma.    Symptoms largely stable.  Cough comes and goes.  Level of dyspnea increases and decreases.  Largely given by temperature outdoors, change in weather season etc  Good adherence to [employer].  Previous on Xolair stopped due to intolerance.  We discussed treatment options moving forward in detail today.  HPI at initial visit: Patient has ongoing dyspnea on exertion.  Present for years.  May be slightly worsening over time.  She states she is active and walks every day.  However she does not walk as much as she used to.  Less distance than prior.  She is been escalated to [facility] for triple inhaled therapy as well as Arnuity added for additional ICS therapy for asthma.  No real improvement in her breathing.  She thinks the inhalers helped some but not massively.  She does use albuterol as needed with some improvement albeit short-lived.  No time of day when things are better or worse.  No position makes it better worse.  No seasonal or environmental factors she can identify to make things better or worse.  No other alleviating or exacerbating factors.  She recently had a CTA coronary scan 05/2022 that on my review interpretation reveals mosaicism otherwise clear lungs suspicious for small airways disease, noting limitation and not seen upper portions of the lungs.  Most recent chest x-ray 04/2022 reviewed and interpreted as clear lungs bilaterally.  She reports being diagnosed with interstitial lung disease in 2012.  There is no evidence of this on recent cross-sectional imaging.  It sounds like she was hospitalized at that time with pneumonia, bronchitis, asthma  exacerbation.  Suspect this was an acute pneumonitis or pneumonia.  Furthermore, she had a high-res CT scan 2015, I cannot review images but report reveals clear lungs and no evidence of interstitial lung disease.  For further workup of shortness of breath, her echo in 05/2022 did reveal grade 1 diastolic dysfunction but otherwise reassuring results.  She had a perfusion stress test 08/2020 that is normal.  She has had multiple lower extremity Dopplers that showed no DVT.  Notably, her labs have revealed persistently elevated IgE but none tested recently.  She was on Xolair and reports itchiness and MSK pain so this was stopped.  She is not sure it helped her breathing at all.    Questionaires / Pulmonary Flowsheets:   ACT:  Asthma Control Test ACT Total Score  12/03/2022  8:26 AM 12  08/12/2022  8:24 AM 12  07/21/2022  9:00 AM 14    MMRC:     No data to display          Epworth:      No data to display          Tests:   FENO:  No results found for: "NITRICOXIDE"  PFT:    Latest Ref Rng & Units 09/15/2022    9:52 AM  PFT Results  FVC-Pre L 1.49   FVC-Predicted Pre % 58   Pre FEV1/FVC % % 93   FEV1-Pre L 1.39   FEV1-Predicted Pre % 72   DLCO uncorrected ml/min/mmHg 11.03  DLCO UNC% % 66   DLCO corrected ml/min/mmHg 11.60   DLCO COR %Predicted % 70   DLVA Predicted % 106   TLC L 3.88   TLC % Predicted % 90   RV % Predicted % 110   Reviewed interpreted spirometry just of moderate restriction versus air trapping.  Bronchodilators not administered.  TLC within normal limits, air trapping present consistent with small airways disease, DLCO is mildly reduced but likely underestimated given lung volume used is less than 75% of TLC reported and lung volumes.  WALK:     03/02/2016    8:59 AM  SIX MIN WALK  Supplimental Oxygen during Test? (L/min) No  Tech Comments: brisk pace/no SOB//lmr    Imaging: Personally reviewed and as per EMR and discussion in this  note No results found.  Lab Results: Personally reviewed CBC    Component Value Date/Time   WBC 9.5 04/12/2023 0824   RBC 4.42 04/12/2023 0824   HGB 11.8 04/12/2023 0824   HGB 12.8 08/18/2020 1510   HCT 37.7 04/12/2023 0824   HCT 39.1 08/18/2020 1510   PLT 218 04/12/2023 0824   PLT 171 08/18/2020 1510   MCV 85.3 04/12/2023 0824   MCV 85 08/18/2020 1510   MCH 26.7 (L) 04/12/2023 0824   MCHC 31.3 (L) 04/12/2023 0824   RDW 13.5 04/12/2023 0824   RDW 13.3 08/18/2020 1510   LYMPHSABS 1,435 04/12/2023 0824   LYMPHSABS 1.6 10/17/2019 1134   MONOABS 0.5 08/12/2022 0855   EOSABS 38 04/12/2023 0824   EOSABS 0.1 10/17/2019 1134   BASOSABS 19 04/12/2023 0824   BASOSABS 0.0 10/17/2019 1134    BMET    Component Value Date/Time   NA 143 04/12/2023 0824   NA 141 05/05/2022 0825   K 3.6 04/12/2023 0824   CL 104 04/12/2023 0824   CO2 29 04/12/2023 0824   GLUCOSE 102 (H) 04/12/2023 0824   BUN 28 (H) 04/12/2023 0824   BUN 32 (H) 05/05/2022 0825   CREATININE 1.06 (H) 04/12/2023 0824   CALCIUM 9.6 04/12/2023 0824   GFRNONAA >60 04/20/2022 0843   GFRAA 99 08/18/2020 1510    BNP    Component Value Date/Time   BNP 70.3 04/20/2022 0843    ProBNP No results found for: "PROBNP"  Specialty Problems       Pulmonary Problems   Wheezing   Sleep apnea   Upper airway cough syndrome   Followed in Pulmonary clinic/ [facility]/ Wert rec hs h2 and h1 08/17/13 > and 01/20/16 > did not take h1 as directed, did not take ppi ac as rec - Allergy profile 03/02/16>  Eos 0.0/  IgE  483  Grass > tree > Ragweed, dog and dust - Sinus CT 03/09/2016 > Negative exam.  No evidence of acute or chronic sinus disease. - trial of singulair 10 mg 04/01/2016       Dyspnea   03/02/2016  Walked RA x 3 laps @ 185 ft each stopped due to  End of study,fast pace, no sob or desat   - Spirometry 03/02/2016  Suboptimal effort on the early portion of f/v but still had nl flows       Multiple pulmonary  nodules determined by computed tomography of lung   See CT chest  12/25/15 largest = 3 mm/ never smoker > no f/u rec by Fleischner society guidelines         Sinusitis   Other seasonal allergic rhinitis    Allergies  Allergen Reactions  Amlodipine Shortness Of Breath and Swelling   Bee Venom Anaphylaxis and Hives   Lisinopril Itching and Cough    Dry cough and severe itching in the throat    Other Other (See Comments)    Mosquito bites cause hives all over body   Aspirin Other (See Comments)    Causes stomach problems   Cefdinir     Other reaction(s): hives   Erythromycin Other (See Comments)    Ate the lining of her stomach   Hctz [Hydrochlorothiazide]     Rash    Penicillins Rash    Has patient had a PCN reaction causing immediate rash, facial/tongue/throat swelling, SOB or lightheadedness with hypotension: Yes Has patient had a PCN reaction causing severe rash involving mucus membranes or skin necrosis: Yes Has patient had a PCN reaction that required hospitalization: No  Has patient had a PCN reaction occurring within the last 10 years: No If all of the above answers are "NO", then may proceed with Cephalosporin use.     Immunization History  Administered Date(s) Administered   Influenza, High Dose Seasonal PF 08/05/2022   Influenza,inj,Quad PF,6+ Mos 05/20/2021   Moderna Sars-Covid-2 Vaccination 10/02/2019, 10/30/2019, 05/05/2020, 10/13/2020    Past Medical History:  Diagnosis Date   Asthma    CAD (coronary artery disease)    Moderate, nonobstructive CAD as seen on coronary CTA in 2020 with normal FFR.   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 POET in 2015 with no acute findings.   Diarrhea since 02-16-17   c dif test normal per pt   Dyspnea     with exertion, occ at rest no oxygen or  inhaler use   GERD (gastroesophageal reflux disease)    H/O: rheumatic fever as child   Headache    occ left side migraine   Hyperlipidemia    Hypertension    Leg edema    Lung disease    Lung nodule   Non-alcoholic cirrhosis (HCC) 2013   no current issues with   Pneumonia 2016   Rheumatoid arthritis (HCC)    oa also, back arthritis, hx rheumatoid     Tobacco History: Social History   Tobacco Use  Smoking Status Never   Passive exposure: Never  Smokeless Tobacco Never   Counseling given: Not Answered   Continue to not smoke  Outpatient Encounter Medications as of 09/07/2023  Medication Sig   acetaminophen (TYLENOL) 500 MG tablet Take 325 mg by mouth daily as needed for mild pain, fever or headache.   ARNUITY ELLIPTA 200 MCG/ACT AEPB INHALE 1 PUFF INTO THE LUNGS DAILY   azelastine (OPTIVAR) 0.05 % ophthalmic solution Place 1 drop into both eyes 2 (two) times daily as needed.   Budeson-Glycopyrrol-Formoterol (BREZTRI AEROSPHERE) 160-9-4.8 MCG/ACT AERO INHALE 2 PUFFS INTO THE LUNGS TWICE DAILY TO PREVENT COUGH OR WHEEZE AND RINSE MOUTH AFTERWARDS   diphenhydrAMINE (BENADRYL) 25 MG tablet Take 25 mg by mouth every 6 (six) hours as needed.   EPINEPHrine 0.3 mg/0.3 mL IJ SOAJ injection Inject 0.3 mg into the muscle as needed for anaphylaxis.   furosemide (LASIX) 40 MG tablet Take 1 tablet (40 mg total) by mouth daily. Take 1 tablet daily as needed for weight gain of 3 lb overnight and 5 lb  in 1 week   ipratropium-albuterol (DUONEB) 0.5-2.5 (3) MG/3ML SOLN Use 1 vial via nebulizer every 4-6 hours as needed for cough, wheeze, shortness of breath or chest tightness   isosorbide mononitrate (IMDUR) 30 MG 24 hr tablet Take 1 tablet (30 mg total) by mouth daily.   levocetirizine (XYZAL) 5 MG tablet TAKE 1 TABLET(5 MG) BY MOUTH EVERY EVENING   losartan (COZAAR) 25 MG tablet TAKE 1 TABLET(25 MG) BY MOUTH IN THE MORNING AND AT BEDTIME   pantoprazole (PROTONIX) 20 MG tablet Take 40 mg by  mouth daily.   pregabalin (LYRICA) 50 MG capsule Take 1 capsule (50 mg total) by mouth 2 (two) times daily.   RESTASIS 0.05 % ophthalmic emulsion 1 drop 2 (two) times daily.   rosuvastatin (CRESTOR) 20 MG tablet TAKE 1 TABLET(20 MG) BY MOUTH DAILY   Sodium Chloride-Sodium Bicarb 1.57 g PACK Place into the nose.   VENTOLIN HFA 108 (90 Base) MCG/ACT inhaler Inhale 2 puffs into the lungs every 4 (four) hours as needed for wheezing or shortness of breath.   Fluticasone-Umeclidin-Vilant (TRELEGY ELLIPTA) 200-62.5-25 MCG/ACT AEPB Inhale 1 Inhalation into the lungs every morning. (Patient not taking: Reported on 09/07/2023)   nitroGLYCERIN (NITROSTAT) 0.4 MG SL tablet Place 1 tablet (0.4 mg total) under the tongue every 5 (five) minutes as needed for chest pain. (Patient not taking: Reported on 08/30/2022)   No facility-administered encounter medications on file as of 09/07/2023.     Review of Systems  Review of Systems  N/a Physical Exam  BP 116/70 (BP Location: Left Arm, Patient Position: Sitting, Cuff Size: Normal)   Pulse (!) 105   Ht 4\' 11"  (1.499 m)   Wt 186 lb 9.6 oz (84.6 kg)   SpO2 97%   BMI 37.69 kg/m   Wt Readings from Last 5 Encounters:  09/07/23 186 lb 9.6 oz (84.6 kg)  04/12/23 184 lb (83.5 kg)  04/05/23 184 lb (83.5 kg)  03/03/23 184 lb 12.8 oz (83.8 kg)  12/03/22 185 lb 3.2 oz (84 kg)    BMI Readings from Last 5 Encounters:  09/07/23 37.69 kg/m  04/12/23 37.16 kg/m  04/05/23 37.16 kg/m  03/03/23 37.33 kg/m  12/03/22 37.41 kg/m     Physical Exam General: Sitting in chair, no acute distress Eyes: EOMI, no icterus Neck: Supple, no JVP appreciated Pulmonary: Clear, normal work of breathing Cardiovascular: Warm, trace to 1+ pitting edema b/l Abdomen: Nondistended, sounds present MSK: No synovitis, no joint effusion Neuro: Normal gait, no weakness Psych: Normal mood, full affect  Assessment & Plan:   Severe persistent asthma: On triple inhaled therapy via  Breztri and Arnuity for additional ICS therapy.  Still with shortness of breath, cough.  Worse when outdoors.  Some relief with albuterol.  Suspect difficult to control with inhalers given elevated IgE in the past.  She reports adverse reaction to Xolair including itching, musculoskeletal pain.  Eosinophils not elevated.  I do think there is more room to improve her symptom control but she is resistant to Biologics, Tezspire would be her option at this time.  Encouraged ongoing allergy/immunology follow-up.  Also discussed thrice weekly azithromycin but given her issues with erythromycin in the past this likely is not a good option.  Dyspnea on exertion: Suspect multifactorial and related to deconditioning, she is active but less active than prior, habitus, poorly controlled asthma.  Chest imaging in the past has been clear.  Low suspicion for additional parenchymal disease or other pulmonary contributors.  TTE in 05/2022  did demonstrate diastolic dysfunction, she has lower extremity swelling so cardiac contributors are certainly possible if not likely.  PFTs largely normal which is reassuring.  No other contributors from the lungs that can be identified.  Nasal congestion: Discussed referral to ENT in the future to see if there are other things that can add to help with her significant nasal congestion, rhinorrhea, postnasal drip contributing to cough.   Return in about 3 months (around 12/08/2023) for f/u Dr. Judeth Horn.   Karren Burly, MD 09/07/2023

## 2023-09-07 NOTE — Patient Instructions (Signed)
 Nice to see you again  No change in the medication  If you decide a better time to see a new ENT doctor to help with the nasal congestion and allergies etc. let me know  The other medication I would consider in terms of injections for asthma and cough is called Tezspire.  This may be something we need to explore more in the future.  Return to clinic in 3 months or sooner as needed with Dr. Judeth Horn

## 2023-09-08 ENCOUNTER — Encounter: Payer: Self-pay | Admitting: Cardiology

## 2023-09-08 ENCOUNTER — Ambulatory Visit: Payer: Medicare Other | Attending: Cardiology | Admitting: Cardiology

## 2023-09-08 VITALS — BP 122/78 | HR 79 | Ht 59.0 in | Wt 185.6 lb

## 2023-09-08 DIAGNOSIS — M7989 Other specified soft tissue disorders: Secondary | ICD-10-CM | POA: Insufficient documentation

## 2023-09-08 DIAGNOSIS — I10 Essential (primary) hypertension: Secondary | ICD-10-CM | POA: Diagnosis present

## 2023-09-08 DIAGNOSIS — R072 Precordial pain: Secondary | ICD-10-CM | POA: Diagnosis present

## 2023-09-08 DIAGNOSIS — E785 Hyperlipidemia, unspecified: Secondary | ICD-10-CM | POA: Insufficient documentation

## 2023-09-08 NOTE — Patient Instructions (Signed)

## 2023-09-30 ENCOUNTER — Other Ambulatory Visit: Payer: Self-pay | Admitting: Gastroenterology

## 2023-09-30 DIAGNOSIS — R109 Unspecified abdominal pain: Secondary | ICD-10-CM

## 2023-09-30 DIAGNOSIS — R198 Other specified symptoms and signs involving the digestive system and abdomen: Secondary | ICD-10-CM

## 2023-10-03 NOTE — Progress Notes (Unsigned)
 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  May help with headaches but will likely help with neuralgia.  Would have to check with GI specialist about potential use of Cymbalta. Advised to follow up with dentist for evaluation of left TMJ dysfunction Limit use of pain relievers to no more than 2 days out of week to prevent risk of rebound or medication-overuse headache. Follow up 6 months.       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    UPDATE: MRI of brain with and without contrast on 05/06/2023 personally reviewed revealed 5 mm meningioma overlying the posteromedial aspect of the right cerebellar hemisphere as well as mild chronic small vessel ischemic changes without the cerebral white matter but otherwise unremarkable.    Started on Lyrica *** to treat both headaches and neuralgia.  Also advised to follow up with her dentist regarding possible TMJ dysfunction.  ***    HISTORY: In October 2022, she fell and hit her forehead and face on concrete after tripping outside.  She sustained forehead hematoma..  She was seen in the ED where CT head and maxillofacial personally reviewed were negative for acute fractures or intracranial abnormalities.  Since then, she started having headaches.  They are left sided (rarely right sided) pressure in the eye and left side of head, a dull pressure that gradually becomes severe later in the day.  Associated photosensitivity.  No nausea,  vomiting, phonophobia, visual disturbance, or unilateral numbness or weakness.  It is persistent.  Has some neck pain but subsequently underwent left shoulder surgery.  Reading and working on computer aggravates it.  Does not treat with analgesics.  She was treated at [facility] for concussion which helped.  She underwent vestibular rehab.  She was experiencing rhinorrhea and has a history of chronic sinusitis, so it was suspected that her ongoing headaches were related to allergies and sinusitis.  She saw ENT.  CT sinuses on 08/27/2021 were normal.  Eye exams have been unremarkable except for dry eye for which she received drops.     In addition, over the past year she notes burning in her feet and hands.  It is also persistent.  Involves the left foot and calf and lateral right foot.  Involves the fingers in the hand.  Sometimes her fingers or toes will curl and get stuck.  She had rheumatoid arthritis since childhood but was told by rheumatology that it changed to osteoarthritis a couple of years ago.  No longer on Plaquenil.  Sed rate and CRP this month were 104 and 11.3 respectively.  Autoimmune labs from June 2022 include sed rate 89, negative RF, negative CCP antibody, ds-DNA antibody 4, negative RNP antibody.   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  NCV-EMG on 06/08/2022 revealed mild left ulnar neuropathy with slowing across the elbow and chronic mild bilateral L4 radiculopathy but no large fiber sensorimotor polyneuropathy.  She she does endorse pain and numbness radiating up the arm along the ulnar  distribution.  Elbow is tender to touch and when leaning just a little bit.  Always tries to keep arm straight, which is difficult at times.  She has neck and back pain.  Sometimes she feels pain down the legs as well.    Previously took gabapentin, which upset her stomach.  Did not start nortriptyline because  her pharmacist was concerned about side effects and interactions with her other medications.  Cannot take NSAIDs due to GERD.  PAST MEDICAL HISTORY: Past Medical History:  Diagnosis Date   Asthma    CAD (coronary artery disease)    Moderate, nonobstructive CAD as seen on coronary CTA in 2020 with normal FFR.   Cataracts, bilateral    Chronic kidney disease    cyst in left kidney   Class 2 obesity    Colon cancer [facility]) 2017   surgical tx  removal of polyp   Concussion 04/20/2021   Coronary artery calcification    a. POET in 2015 with no acute findings.   Diarrhea since 02-16-17   c dif test normal per pt   Dyspnea     with exertion, occ at rest no oxygen or inhaler use   GERD (gastroesophageal reflux disease)    H/O: rheumatic fever as child   Headache    occ left side migraine   Hyperlipidemia    Hypertension    Leg edema    Lung disease    Lung nodule   Non-alcoholic cirrhosis (HCC) 2013   no current issues with   Pneumonia 2016   Rheumatoid arthritis (HCC)    oa also, back arthritis, hx rheumatoid     MEDICATIONS: Current Outpatient Medications on File Prior to Visit  Medication Sig Dispense Refill   acetaminophen (TYLENOL) 500 MG tablet Take 325 mg by mouth daily as needed for mild pain, fever or headache.     ARNUITY ELLIPTA 200 MCG/ACT AEPB INHALE 1 PUFF INTO THE LUNGS DAILY (Patient not taking: Reported on 09/08/2023) 30 each 5   azelastine (OPTIVAR) 0.05 % ophthalmic solution Place 1 drop into both eyes 2 (two) times daily as needed. 6 mL 5   Budeson-Glycopyrrol-Formoterol (BREZTRI AEROSPHERE) 160-9-4.8 MCG/ACT AERO INHALE 2 PUFFS INTO THE LUNGS TWICE DAILY TO PREVENT COUGH OR WHEEZE AND RINSE MOUTH AFTERWARDS 10.7 g 5   diphenhydrAMINE (BENADRYL) 25 MG tablet Take 25 mg by mouth every 6 (six) hours as needed.     EPINEPHrine 0.3 mg/0.3 mL IJ SOAJ injection Inject 0.3 mg into the muscle as needed for anaphylaxis. 2 each 1   Fluticasone-Umeclidin-Vilant (TRELEGY  ELLIPTA) 200-62.5-25 MCG/ACT AEPB Inhale 1 Inhalation into the lungs every morning. 84 each 0   furosemide (LASIX) 40 MG tablet Take 1 tablet (40 mg total) by mouth daily. Take 1 tablet daily as needed for weight gain of 3 lb overnight and 5 lb in 1 week 90 tablet 3   ipratropium-albuterol (DUONEB) 0.5-2.5 (3) MG/3ML SOLN Use 1 vial via nebulizer every 4-6 hours as needed for cough, wheeze, shortness of breath or chest tightness 180 mL 1   isosorbide mononitrate (IMDUR) 30 MG 24 hr tablet Take 1 tablet (30 mg total) by mouth daily. 90 tablet 3   levocetirizine (XYZAL) 5 MG tablet TAKE 1 TABLET(5 MG) BY MOUTH EVERY EVENING 90 tablet 1   losartan (COZAAR) 25 MG tablet TAKE 1 TABLET(25 MG) BY MOUTH IN THE MORNING AND AT BEDTIME 180 tablet 3   nitroGLYCERIN (NITROSTAT) 0.4 MG SL tablet Place 1 tablet (0.4 mg total)  under the tongue every 5 (five) minutes as needed for chest pain. (Patient not taking: Reported on 08/30/2022) 25 tablet 3   pantoprazole (PROTONIX) 20 MG tablet Take 40 mg by mouth daily.     pregabalin (LYRICA) 50 MG capsule Take 1 capsule (50 mg total) by mouth 2 (two) times daily. 60 capsule 5   RESTASIS 0.05 % ophthalmic emulsion 1 drop 2 (two) times daily.     rosuvastatin (CRESTOR) 20 MG tablet TAKE 1 TABLET(20 MG) BY MOUTH DAILY 30 tablet 3   Sodium Chloride-Sodium Bicarb 1.57 g PACK Place into the nose.     VENTOLIN HFA 108 (90 Base) MCG/ACT inhaler Inhale 2 puffs into the lungs every 4 (four) hours as needed for wheezing or shortness of breath. 18 g 0   No current facility-administered medications on file prior to visit.    ALLERGIES: Allergies  Allergen Reactions   Amlodipine Shortness Of Breath and Swelling   Bee Venom Anaphylaxis and Hives   Lisinopril Itching and Cough    Dry cough and severe itching in the throat    Other Other (See Comments)    Mosquito bites cause hives all over body   Aspirin Other (See Comments)    Causes stomach problems   Cefdinir     Other  reaction(s): hives   Erythromycin Other (See Comments)    Ate the lining of her stomach   Hctz [Hydrochlorothiazide]     Rash    Penicillins Rash    Has patient had a PCN reaction causing immediate rash, facial/tongue/throat swelling, SOB or lightheadedness with hypotension: Yes Has patient had a PCN reaction causing severe rash involving mucus membranes or skin necrosis: Yes Has patient had a PCN reaction that required hospitalization: No  Has patient had a PCN reaction occurring within the last 10 years: No If all of the above answers are "NO", then may proceed with Cephalosporin use.     FAMILY HISTORY: Family History  Problem Relation Age of Onset   CAD Mother 62       CABG/stents   Breast cancer Mother    Kidney failure Mother    Asthma Father    CAD Father 17   Allergies Father    CAD Brother    CAD Brother 89       9 stents   CAD Brother 29       7 stents   Heart attack Brother       Objective:  ***. General: No acute distress.  Patient appears well-groomed.   Head:  Normocephalic/atraumatic, tenderness to palpation of left TMJ *** Head:  Normocephalic/atraumatic Neck:  Supple.  No paraspinal tenderness.  Full range of motion. Heart:  Regular rate and rhythm. Neuro:  Alert and oriented.  Speech fluent and not dysarthric.  Language intact.  CN II-XII intact.  Bulk and tone normal.  Muscle strength 5/5 throughout.  Deep tendon reflexes 2+ throughout.  Gait normal.  Romberg negative.   Shon Millet, DO  CC: Jarrett Soho, PA-C

## 2023-10-04 ENCOUNTER — Encounter: Payer: Self-pay | Admitting: Neurology

## 2023-10-04 ENCOUNTER — Other Ambulatory Visit: Payer: Self-pay | Admitting: Nurse Practitioner

## 2023-10-04 ENCOUNTER — Ambulatory Visit (INDEPENDENT_AMBULATORY_CARE_PROVIDER_SITE_OTHER): Payer: Medicare Other | Admitting: Neurology

## 2023-10-04 VITALS — BP 113/68 | HR 96 | Ht 59.0 in | Wt 186.0 lb

## 2023-10-04 DIAGNOSIS — G44321 Chronic post-traumatic headache, intractable: Secondary | ICD-10-CM | POA: Diagnosis not present

## 2023-10-04 DIAGNOSIS — G629 Polyneuropathy, unspecified: Secondary | ICD-10-CM | POA: Diagnosis not present

## 2023-10-04 DIAGNOSIS — M5030 Other cervical disc degeneration, unspecified cervical region: Secondary | ICD-10-CM

## 2023-10-04 DIAGNOSIS — G5622 Lesion of ulnar nerve, left upper limb: Secondary | ICD-10-CM

## 2023-10-04 NOTE — Patient Instructions (Signed)
 Continue pregablin 50mg  twice daily Follow up with dentist for evaluation of TMJ dysfunction Follow up in 6 months.

## 2023-10-15 ENCOUNTER — Other Ambulatory Visit: Payer: Self-pay | Admitting: Allergy

## 2023-10-24 ENCOUNTER — Ambulatory Visit
Admission: RE | Admit: 2023-10-24 | Discharge: 2023-10-24 | Disposition: A | Discharge status: Home / Self Care | Source: Ambulatory Visit | Attending: Gastroenterology | Admitting: Gastroenterology

## 2023-10-24 ENCOUNTER — Other Ambulatory Visit: Payer: Self-pay | Admitting: Allergy

## 2023-10-24 DIAGNOSIS — R198 Other specified symptoms and signs involving the digestive system and abdomen: Secondary | ICD-10-CM

## 2023-10-24 DIAGNOSIS — R109 Unspecified abdominal pain: Secondary | ICD-10-CM

## 2023-10-25 ENCOUNTER — Other Ambulatory Visit (HOSPITAL_COMMUNITY): Payer: Self-pay | Admitting: Gastroenterology

## 2023-10-25 DIAGNOSIS — R109 Unspecified abdominal pain: Secondary | ICD-10-CM

## 2023-10-25 DIAGNOSIS — R198 Other specified symptoms and signs involving the digestive system and abdomen: Secondary | ICD-10-CM

## 2023-10-29 ENCOUNTER — Other Ambulatory Visit: Payer: Self-pay | Admitting: Cardiology

## 2023-10-31 ENCOUNTER — Telehealth: Payer: Self-pay | Admitting: Cardiology

## 2023-10-31 MED ORDER — ISOSORBIDE MONONITRATE ER 30 MG PO TB24: 30.0000 mg | ORAL_TABLET | Freq: Every day | ORAL | 3 refills | Status: AC

## 2023-10-31 NOTE — Telephone Encounter (Signed)
 Pt's medication was sent to pt's pharmacy as requested. Confirmation received.

## 2023-10-31 NOTE — Telephone Encounter (Signed)
 1. Which medications need to be refilled? (please list name of each medication and dose if known)  isosorbide  mononitrate (IMDUR ) 30 MG 24 hr tablet  2. Which pharmacy/location (including street and city if local pharmacy) is medication to be sent to? [id number] - [place], [place] - [address] AT [facility] [address]   3. Do they need a 30 day or 90 day supply?  90 day supply

## 2023-11-01 ENCOUNTER — Ambulatory Visit (HOSPITAL_COMMUNITY)
Admission: RE | Admit: 2023-11-01 | Discharge: 2023-11-01 | Disposition: A | Discharge status: Home / Self Care | Source: Ambulatory Visit | Attending: Gastroenterology | Admitting: Gastroenterology

## 2023-11-01 DIAGNOSIS — R198 Other specified symptoms and signs involving the digestive system and abdomen: Secondary | ICD-10-CM | POA: Diagnosis present

## 2023-11-01 DIAGNOSIS — R109 Unspecified abdominal pain: Secondary | ICD-10-CM | POA: Diagnosis present

## 2023-11-01 MED ORDER — IOHEXOL 350 MG/ML SOLN
100.0000 mL | Freq: Once | INTRAVENOUS | Status: AC | PRN
  Administered 2023-11-01: 100 mL via INTRAVENOUS

## 2023-11-17 ENCOUNTER — Encounter: Payer: Self-pay | Admitting: Allergy

## 2023-11-17 ENCOUNTER — Other Ambulatory Visit: Payer: Self-pay

## 2023-11-17 ENCOUNTER — Ambulatory Visit (INDEPENDENT_AMBULATORY_CARE_PROVIDER_SITE_OTHER): Payer: Medicare Other | Admitting: Allergy

## 2023-11-17 VITALS — BP 118/70 | HR 96 | Temp 98.1°F | Ht <= 58 in | Wt 184.4 lb

## 2023-11-17 DIAGNOSIS — J3089 Other allergic rhinitis: Secondary | ICD-10-CM

## 2023-11-17 DIAGNOSIS — J300 Vasomotor rhinitis: Secondary | ICD-10-CM | POA: Diagnosis not present

## 2023-11-17 DIAGNOSIS — J4550 Severe persistent asthma, uncomplicated: Secondary | ICD-10-CM

## 2023-11-17 DIAGNOSIS — H1012 Acute atopic conjunctivitis, left eye: Secondary | ICD-10-CM

## 2023-11-17 MED ORDER — ARNUITY ELLIPTA 200 MCG/ACT IN AEPB: 1.0000 | INHALATION_SPRAY | Freq: Every day | RESPIRATORY_TRACT | 5 refills | Status: DC

## 2023-11-17 MED ORDER — CARBINOXAMINE MALEATE 4 MG PO TABS: ORAL_TABLET | ORAL | 5 refills | Status: DC

## 2023-11-17 MED ORDER — AZELASTINE HCL 0.05 % OP SOLN: 1.0000 [drp] | Freq: Two times a day (BID) | OPHTHALMIC | 5 refills | Status: DC | PRN

## 2023-11-17 MED ORDER — BREZTRI AEROSPHERE 160-9-4.8 MCG/ACT IN AERO: INHALATION_SPRAY | RESPIRATORY_TRACT | 5 refills | Status: DC

## 2023-11-17 NOTE — Progress Notes (Signed)
 Follow-up Note  RE: Kendra Gilbert MRN: [id number] DOB: 1955/10/08 Date of Office Visit: 11/17/2023   History of present illness: Kendra Gilbert is a 68 y.o. female presenting today for follow-up of  severe persistent asthma, allergic rhinitis with conjunctivitis and vasomotor rhinitis.  She was last seen in the office on 05/20/2023 by myself.  Discussed the use of AI scribe software for clinical note transcription with the patient, who gave verbal consent to proceed.  She experiences worsening respiratory symptoms, including difficulty with exhaling and inhaling, particularly in hot weather. Her last lung function test in March 2024 showed a decline with FEV1 at 53% and FVC at 48%, compared to 72% and 58% the previous year. She uses a nebulizer as needed and reports that her inhalers have been effective, though she requires the rescue inhaler three puffs when needed. She is currently on Breztri  and Arnuity inhalers and uses albuterol  as a rescue inhaler.  She experiences inflammation, particularly in her fingers, which swell in hot weather, affecting her dexterity but states in the morning time her dexterity is usually pretty good but worsens throughout the day especially if she is out in the heat. She manages better in the cooler mornings.  She is dealing with kidney issues and is awaiting a referral to a kidney specialist. She is on diuretics to manage fluid retention, which she believes contributes to her respiratory symptoms. Not taking the diuretic increases her congestion.  She has increased allergy  symptoms, including itchiness, a runny nose, and congestion, particularly itchiness on the left side of her throat. Various antihistamines, including Allegra, have not provided significant relief. She has not used Singulair  recently. She uses Restasis for her eyes but is cautious about adding more medications.  Her GERD has been acting up, causing abdominal sensitivity. Some medications  work for her GERD while others do not.      Review of systems: 10pt ROS negative unless noted above in HPI  Past medical/social/surgical/family history have been reviewed and are unchanged unless specifically indicated below.  No changes  Medication List: Current Outpatient Medications  Medication Sig Dispense Refill   acetaminophen  (TYLENOL ) 500 MG tablet Take 325 mg by mouth daily as needed for mild pain, fever or headache.     ARNUITY ELLIPTA  200 MCG/ACT AEPB INHALE 1 PUFF INTO THE LUNGS DAILY 30 each 0   azelastine  (OPTIVAR ) 0.05 % ophthalmic solution Place 1 drop into both eyes 2 (two) times daily as needed. 6 mL 5   Budeson-Glycopyrrol-Formoterol  (BREZTRI  AEROSPHERE) 160-9-4.8 MCG/ACT AERO INHALE 2 PUFFS INTO THE LUNGS TWICE DAILY TO PREVENT COUGH OR WHEEZE AND RINSE MOUTH AFTERWARDS 10.7 g 5   diphenhydrAMINE (BENADRYL) 25 MG tablet Take 25 mg by mouth every 6 (six) hours as needed.     EPINEPHrine  0.3 mg/0.3 mL IJ SOAJ injection Inject 0.3 mg into the muscle as needed for anaphylaxis. 2 each 1   furosemide  (LASIX ) 40 MG tablet Take 1 tablet (40 mg total) by mouth daily. Take 1 tablet daily as needed for weight gain of 3 lb overnight and 5 lb in 1 week 90 tablet 3   ipratropium-albuterol  (DUONEB) 0.5-2.5 (3) MG/3ML SOLN Use 1 vial via nebulizer every 4-6 hours as needed for cough, wheeze, shortness of breath or chest tightness 180 mL 1   isosorbide  mononitrate (IMDUR ) 30 MG 24 hr tablet Take 1 tablet (30 mg total) by mouth daily. 90 tablet 3   levocetirizine (XYZAL ) 5 MG tablet TAKE 1 TABLET(5 MG) BY MOUTH EVERY  EVENING 90 tablet 0   losartan  (COZAAR ) 25 MG tablet TAKE 1 TABLET(25 MG) BY MOUTH IN THE MORNING AND AT BEDTIME 180 tablet 3   pantoprazole  (PROTONIX ) 20 MG tablet Take 40 mg by mouth daily.     pregabalin  (LYRICA ) 50 MG capsule Take 1 capsule (50 mg total) by mouth 2 (two) times daily. 60 capsule 5   RESTASIS 0.05 % ophthalmic emulsion 1 drop 2 (two) times daily.      rosuvastatin  (CRESTOR ) 20 MG tablet Take 1 tablet (20 mg total) by mouth daily. 90 tablet 3   Sodium Chloride -Sodium Bicarb 1.57 g PACK Place into the nose.     VENTOLIN  HFA 108 (90 Base) MCG/ACT inhaler Inhale 2 puffs into the lungs every 4 (four) hours as needed for wheezing or shortness of breath. 18 g 0   nitroGLYCERIN  (NITROSTAT ) 0.4 MG SL tablet Place 1 tablet (0.4 mg total) under the tongue every 5 (five) minutes as needed for chest pain. (Patient not taking: Reported on 10/04/2023) 25 tablet 3   No current facility-administered medications for this visit.     Known medication allergies: Allergies  Allergen Reactions   Amlodipine  Shortness Of Breath and Swelling   Bee Venom Anaphylaxis and Hives   Lisinopril  Itching and Cough    Dry cough and severe itching in the throat    Other Other (See Comments)    Mosquito bites cause hives all over body   Aspirin Other (See Comments)    Causes stomach problems   Cefdinir     Other reaction(s): hives   Erythromycin Other (See Comments)    Ate the lining of her stomach   Hctz [Hydrochlorothiazide ]     Rash    Penicillins Rash    Has patient had a PCN reaction causing immediate rash, facial/tongue/throat swelling, SOB or lightheadedness with hypotension: Yes Has patient had a PCN reaction causing severe rash involving mucus membranes or skin necrosis: Yes Has patient had a PCN reaction that required hospitalization: No  Has patient had a PCN reaction occurring within the last 10 years: No If all of the above answers are "NO", then may proceed with Cephalosporin use.      Physical examination: Blood pressure 118/70, pulse 96, temperature 98.1 F (36.7 C), temperature source Temporal, height 4' 8.69" (1.44 m), weight 184 lb 6.4 oz (83.6 kg), SpO2 95%.  General: Alert, interactive, in no acute distress. HEENT: PERRLA, TMs pearly gray, turbinates mildly edematous without discharge, post-pharynx non erythematous. Neck: Supple without  lymphadenopathy. Lungs: Decreased breath sounds bilaterally without wheezing, rhonchi or rales. {no increased work of breathing. CV: Normal S1, S2 without murmurs. Abdomen: Nondistended, nontender. Skin: Warm and dry, without lesions or rashes. Extremities:  DIP joints hands bilaterally with nodules Neuro:   Grossly intact.  Diagnositics/Labs:  Spirometry: FEV1: 1.01L 53%, FVC: 1.16L 48%, ratio consistent with air  Assessment and plan: Severe persistent asthma Allergic rhinitis with conjunctivitis Vasomotor rhinitis   - continue avoidance measures for grass pollens, weed pollens, tree pollens, mold, dust mites. - Trial Carbinoxamine 4mg  1 tab 1-2 times a day for next month.  This is a prescription based antihistamine.  Let me know if you tolerate this and if it is effective for you.   - try use of Astelin  1 spray each nostril daily as needed for runny nose to start off and see if can tolerate use.      - consider trying Singulair  again - use Bepreve 1 drop each eye twice a day  as needed for itchy/watery eyes.   - have access to albuterol  inhaler 2 puffs or albuterol  1 vial via nebulizer every 4-6 hours as needed for cough/wheeze/shortness of breath/chest tightness.  May use 15-20 minutes prior to activity.   Monitor frequency of use.    Use inhaler with spacer device.   - continue Breztri  2 puffs twice a day.  - continue Arnuity 1 puff daily.  Recommend changing to morning dose to help with symptoms with morning outdoor activity.     Asthma control goals:  Full participation in all desired activities (may need albuterol  before activity) Albuterol  use two time or less a w eek on average (not counting use with activity) Cough interfering with sleep two time or less a month Oral steroids no more than once a year No hospitalizations   Follow-up in 4-6 months or sooner if needed  I appreciate the opportunity to take part in Kendra Gilbert's care. Please do not hesitate to contact me with  questions.  Sincerely,   Catha Clink, MD Allergy /Immunology [facility]

## 2023-11-17 NOTE — Patient Instructions (Addendum)
-   continue avoidance measures for grass pollens, weed pollens, tree pollens, mold, dust mites. - Trial Carbinoxamine 4mg  1 tab 1-2 times a day for next month.  This is a prescription based antihistamine.  Let me know if you tolerate this and if it is effective for you.   - try use of Astelin  1 spray each nostril daily as needed for runny nose to start off and see if can tolerate use.      - consider trying Singulair  again - use Bepreve 1 drop each eye twice a day as needed for itchy/watery eyes.   - have access to albuterol  inhaler 2 puffs or albuterol  1 vial via nebulizer every 4-6 hours as needed for cough/wheeze/shortness of breath/chest tightness.  May use 15-20 minutes prior to activity.   Monitor frequency of use.    Use inhaler with spacer device.   - continue Breztri  2 puffs twice a day.  - continue Arnuity 1 puff daily.  Recommend changing to morning dose to help with symptoms with morning outdoor activity.     Asthma control goals:  Full participation in all desired activities (may need albuterol  before activity) Albuterol  use two time or less a w eek on average (not counting use with activity) Cough interfering with sleep two time or less a month Oral steroids no more than once a year No hospitalizations   Follow-up in 4-6 months or sooner if needed

## 2023-11-18 ENCOUNTER — Other Ambulatory Visit: Payer: Self-pay

## 2023-11-18 ENCOUNTER — Telehealth: Payer: Self-pay

## 2023-11-18 ENCOUNTER — Other Ambulatory Visit (HOSPITAL_COMMUNITY): Payer: Self-pay

## 2023-11-18 MED ORDER — IPRATROPIUM-ALBUTEROL 0.5-2.5 (3) MG/3ML IN SOLN: RESPIRATORY_TRACT | 1 refills | Status: DC

## 2023-11-18 NOTE — Telephone Encounter (Signed)
*  Asthma/Allergy   Pharmacy Patient Advocate Encounter   Received notification from CoverMyMeds that prior authorization for Carbinoxamine Maleate 4MG  tablets  is required/requested.   Insurance verification completed.   The patient is insured through [employer] .   Per test claim: PA required; PA submitted to above mentioned insurance via CoverMyMeds Key/confirmation #/EOC [id number] Status is pending

## 2023-11-18 NOTE — Telephone Encounter (Signed)
 Refill for duoneb sent to pharmacy.

## 2023-11-18 NOTE — Telephone Encounter (Signed)
 PA Case: [id number], Status: Approved, Coverage Starts on: 07/06/2023 12:00:00 AM, Coverage Ends on: 07/04/2024 12:00:00 AM. Questions? Contact [phone]. Effective Date: 07/06/2023 Authorization Expiration Date: 07/04/2024

## 2023-12-02 ENCOUNTER — Encounter: Payer: Self-pay | Admitting: Pulmonary Disease

## 2023-12-02 ENCOUNTER — Ambulatory Visit: Admitting: Pulmonary Disease

## 2023-12-02 VITALS — BP 109/69 | HR 83 | Ht 59.0 in | Wt 187.0 lb

## 2023-12-02 DIAGNOSIS — J019 Acute sinusitis, unspecified: Secondary | ICD-10-CM

## 2023-12-02 DIAGNOSIS — R0609 Other forms of dyspnea: Secondary | ICD-10-CM

## 2023-12-02 DIAGNOSIS — G4733 Obstructive sleep apnea (adult) (pediatric): Secondary | ICD-10-CM

## 2023-12-02 DIAGNOSIS — R0902 Hypoxemia: Secondary | ICD-10-CM | POA: Diagnosis not present

## 2023-12-02 DIAGNOSIS — J302 Other seasonal allergic rhinitis: Secondary | ICD-10-CM | POA: Diagnosis not present

## 2023-12-02 DIAGNOSIS — R942 Abnormal results of pulmonary function studies: Secondary | ICD-10-CM

## 2023-12-02 NOTE — Progress Notes (Signed)
 @Patient  ID: Kendra Gilbert, female    DOB: 1956-03-08, 68 y.o.   MRN: [id number]  Chief Complaint  Patient presents with   Follow-up    Patient states she having issues with breathing (inhaling and exhaling)    Referring provider: Darnelle Elders, PA-C  HPI:   68 y.o. woman whom we are seeing in evaluation of dyspnea on exertion in the setting of severe severe persistent asthma.  Most recent cardiology note reviewed.  Symptoms largely stable.  Cough comes and goes.  Level of dyspnea increases and decreases.  Overall things like there is been a decline over time.  She notes worsening water retention lower extremity swelling.  We discussed this likely as a cause of her shortness of breath.  In the setting of her diastolic dysfunction seen on TTE.  We discussed biologic therapies at length in the past.  Unfortunate had adverse reaction to Xolair  which would be ideal given her IgE.  Discussed try to get additional information.  Discussed using test prior again.  She is reluctant again.  HPI at initial visit: Patient has ongoing dyspnea on exertion.  Present for years.  May be slightly worsening over time.  She states she is active and walks every day.  However she does not walk as much as she used to.  Less distance than prior.  She is been escalated to Breztri  for triple inhaled therapy as well as Arnuity added for additional ICS therapy for asthma.  No real improvement in her breathing.  She thinks the inhalers helped some but not massively.  She does use albuterol  as needed with some improvement albeit short-lived.  No time of day when things are better or worse.  No position makes it better worse.  No seasonal or environmental factors she can identify to make things better or worse.  No other alleviating or exacerbating factors.  She recently had a CTA coronary scan 05/2022 that on my review interpretation reveals mosaicism otherwise clear lungs suspicious for small airways disease,  noting limitation and not seen upper portions of the lungs.  Most recent chest x-ray 04/2022 reviewed and interpreted as clear lungs bilaterally.  She reports being diagnosed with interstitial lung disease in 2012.  There is no evidence of this on recent cross-sectional imaging.  It sounds like she was hospitalized at that time with pneumonia, bronchitis, asthma exacerbation.  Suspect this was an acute pneumonitis or pneumonia.  Furthermore, she had a high-res CT scan 2015, I cannot review images but report reveals clear lungs and no evidence of interstitial lung disease.  For further workup of shortness of breath, her echo in 05/2022 did reveal grade 1 diastolic dysfunction but otherwise reassuring results.  She had a perfusion stress test 08/2020 that is normal.  She has had multiple lower extremity Dopplers that showed no DVT.  Notably, her labs have revealed persistently elevated IgE but none tested recently.  She was on Xolair  and reports itchiness and MSK pain so this was stopped.  She is not sure it helped her breathing at all.    Questionaires / Pulmonary Flowsheets:   ACT:  Asthma Control Test ACT Total Score  12/03/2022  8:26 AM 12  08/12/2022  8:24 AM 12  07/21/2022  9:00 AM 14    MMRC:     No data to display          Epworth:      No data to display          Tests:  FENO:  No results found for: "NITRICOXIDE"  PFT:    Latest Ref Rng & Units 09/15/2022    9:52 AM  PFT Results  FVC-Pre L 1.49   FVC-Predicted Pre % 58   Pre FEV1/FVC % % 93   FEV1-Pre L 1.39   FEV1-Predicted Pre % 72   DLCO uncorrected ml/min/mmHg 11.03   DLCO UNC% % 66   DLCO corrected ml/min/mmHg 11.60   DLCO COR %Predicted % 70   DLVA Predicted % 106   TLC L 3.88   TLC % Predicted % 90   RV % Predicted % 110   Reviewed interpreted spirometry just of moderate restriction versus air trapping.  Bronchodilators not administered.  TLC within normal limits, air trapping present consistent with  small airways disease, DLCO is mildly reduced but likely underestimated given lung volume used is less than 75% of TLC reported and lung volumes.  WALK:     03/02/2016    8:59 AM  SIX MIN WALK  Supplimental Oxygen during Test? (L/min) No  Tech Comments: brisk pace/no SOB//lmr    Imaging: Personally reviewed and as per EMR and discussion in this note No results found.  Lab Results: Personally reviewed CBC    Component Value Date/Time   WBC 9.5 04/12/2023 0824   RBC 4.42 04/12/2023 0824   HGB 11.8 04/12/2023 0824   HGB 12.8 08/18/2020 1510   HCT 37.7 04/12/2023 0824   HCT 39.1 08/18/2020 1510   PLT 218 04/12/2023 0824   PLT 171 08/18/2020 1510   MCV 85.3 04/12/2023 0824   MCV 85 08/18/2020 1510   MCH 26.7 (L) 04/12/2023 0824   MCHC 31.3 (L) 04/12/2023 0824   RDW 13.5 04/12/2023 0824   RDW 13.3 08/18/2020 1510   LYMPHSABS 1,435 04/12/2023 0824   LYMPHSABS 1.6 10/17/2019 1134   MONOABS 0.5 08/12/2022 0855   EOSABS 38 04/12/2023 0824   EOSABS 0.1 10/17/2019 1134   BASOSABS 19 04/12/2023 0824   BASOSABS 0.0 10/17/2019 1134    BMET    Component Value Date/Time   NA 143 04/12/2023 0824   NA 141 05/05/2022 0825   K 3.6 04/12/2023 0824   CL 104 04/12/2023 0824   CO2 29 04/12/2023 0824   GLUCOSE 102 (H) 04/12/2023 0824   BUN 28 (H) 04/12/2023 0824   BUN 32 (H) 05/05/2022 0825   CREATININE 1.06 (H) 04/12/2023 0824   CALCIUM  9.6 04/12/2023 0824   GFRNONAA >60 04/20/2022 0843   GFRAA 99 08/18/2020 1510    BNP    Component Value Date/Time   BNP 70.3 04/20/2022 0843    ProBNP No results found for: "PROBNP"  Specialty Problems       Pulmonary Problems   Wheezing   Sleep apnea   Upper airway cough syndrome   Followed in Pulmonary clinic/ [facility]/ Wert rec hs h2 and h1 08/17/13 > and 01/20/16 > did not take h1 as directed, did not take ppi ac as rec - Allergy  profile 03/02/16>  Eos 0.0/  IgE  483  Grass > tree > Ragweed, dog and dust - Sinus CT  03/09/2016 > Negative exam.  No evidence of acute or chronic sinus disease. - trial of singulair  10 mg 04/01/2016       Dyspnea   03/02/2016  Walked RA x 3 laps @ 185 ft each stopped due to  End of study,fast pace, no sob or desat   - Spirometry 03/02/2016  Suboptimal effort on the early portion of f/v but  still had nl flows       Multiple pulmonary nodules determined by computed tomography of lung   See CT chest  12/25/15 largest = 3 mm/ never smoker > no f/u rec by Fleischner society guidelines         Sinusitis   Other seasonal allergic rhinitis    Allergies  Allergen Reactions   Amlodipine  Shortness Of Breath and Swelling   Bee Venom Anaphylaxis and Hives   Lisinopril  Itching and Cough    Dry cough and severe itching in the throat    Other Other (See Comments)    Mosquito bites cause hives all over body   Aspirin Other (See Comments)    Causes stomach problems   Cefdinir     Other reaction(s): hives   Erythromycin Other (See Comments)    Ate the lining of her stomach   Hctz [Hydrochlorothiazide ]     Rash    Penicillins Rash    Has patient had a PCN reaction causing immediate rash, facial/tongue/throat swelling, SOB or lightheadedness with hypotension: Yes Has patient had a PCN reaction causing severe rash involving mucus membranes or skin necrosis: Yes Has patient had a PCN reaction that required hospitalization: No  Has patient had a PCN reaction occurring within the last 10 years: No If all of the above answers are "NO", then may proceed with Cephalosporin use.     Immunization History  Administered Date(s) Administered   Influenza, High Dose Seasonal PF 08/05/2022   Influenza,inj,Quad PF,6+ Mos 05/20/2021   Moderna Sars-Covid-2 Vaccination 10/02/2019, 10/30/2019, 05/05/2020, 10/13/2020    Past Medical History:  Diagnosis Date   Asthma    CAD (coronary artery disease)    Moderate, nonobstructive CAD as seen on coronary CTA in 2020 with normal FFR.   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 POET in 2015 with no acute findings.   Diarrhea since 02-16-17   c dif test normal per pt   Dyspnea     with exertion, occ at rest no oxygen or inhaler use   GERD (gastroesophageal reflux disease)    H/O: rheumatic fever as child   Headache    occ left side migraine   Hyperlipidemia    Hypertension    Leg edema    Lung disease    Lung nodule   Non-alcoholic cirrhosis (HCC) 2013   no current issues with   Pneumonia 2016   Rheumatoid arthritis (HCC)    oa also, back arthritis, hx rheumatoid     Tobacco History: Social History   Tobacco Use  Smoking Status Never   Passive exposure: Never  Smokeless Tobacco Never   Counseling given: Not Answered   Continue to not smoke  Outpatient Encounter Medications as of 12/02/2023  Medication Sig   acetaminophen  (TYLENOL ) 500 MG tablet Take 325 mg by mouth daily as needed for mild pain, fever or headache.   ARNUITY ELLIPTA  200 MCG/ACT AEPB Inhale 1 puff into the lungs daily.   azelastine  (OPTIVAR ) 0.05 % ophthalmic solution Place 1 drop into both eyes 2 (two) times daily as needed (Itchy, watery eyes).   budesonide -glycopyrrolate-formoterol  (BREZTRI  AEROSPHERE) 160-9-4.8 MCG/ACT AERO inhaler INHALE 2 PUFFS INTO THE LUNGS TWICE DAILY TO PREVENT COUGH OR WHEEZE AND RINSE MOUTH AFTERWARDS  Carbinoxamine  Maleate 4 MG TABS 1 tab 1-2 times a day   diphenhydrAMINE (BENADRYL) 25 MG tablet Take 25 mg by mouth every 6 (six) hours as needed.   EPINEPHrine  0.3 mg/0.3 mL IJ SOAJ injection Inject 0.3 mg into the muscle as needed for anaphylaxis.   furosemide  (LASIX ) 40 MG tablet Take 1 tablet (40 mg total) by mouth daily. Take 1 tablet daily as needed for weight gain of 3 lb overnight and 5 lb in 1 week   ipratropium-albuterol  (DUONEB) 0.5-2.5 (3)  MG/3ML SOLN Use 1 vial via nebulizer every 4-6 hours as needed for cough, wheeze, shortness of breath or chest tightness   isosorbide  mononitrate (IMDUR ) 30 MG 24 hr tablet Take 1 tablet (30 mg total) by mouth daily.   losartan  (COZAAR ) 25 MG tablet TAKE 1 TABLET(25 MG) BY MOUTH IN THE MORNING AND AT BEDTIME   nitroGLYCERIN  (NITROSTAT ) 0.4 MG SL tablet Place 1 tablet (0.4 mg total) under the tongue every 5 (five) minutes as needed for chest pain. (Patient not taking: Reported on 10/04/2023)   pantoprazole  (PROTONIX ) 20 MG tablet Take 40 mg by mouth daily.   pregabalin  (LYRICA ) 50 MG capsule Take 1 capsule (50 mg total) by mouth 2 (two) times daily.   RESTASIS 0.05 % ophthalmic emulsion 1 drop 2 (two) times daily.   rosuvastatin  (CRESTOR ) 20 MG tablet Take 1 tablet (20 mg total) by mouth daily.   Sodium Chloride -Sodium Bicarb 1.57 g PACK Place into the nose.   VENTOLIN  HFA 108 (90 Base) MCG/ACT inhaler Inhale 2 puffs into the lungs every 4 (four) hours as needed for wheezing or shortness of breath.   No facility-administered encounter medications on file as of 12/02/2023.     Review of Systems  Review of Systems  N/a Physical Exam  BP 109/69 (BP Location: Left Arm, Patient Position: Sitting, Cuff Size: Normal)   Pulse 83   Ht 4\' 11"  (1.499 m)   Wt 187 lb (84.8 kg)   SpO2 97%   BMI 37.77 kg/m   Wt Readings from Last 5 Encounters:  12/02/23 187 lb (84.8 kg)  11/17/23 184 lb 6.4 oz (83.6 kg)  10/04/23 186 lb (84.4 kg)  09/08/23 185 lb 9.6 oz (84.2 kg)  09/07/23 186 lb 9.6 oz (84.6 kg)    BMI Readings from Last 5 Encounters:  12/02/23 37.77 kg/m  11/17/23 40.34 kg/m  10/04/23 37.57 kg/m  09/08/23 37.49 kg/m  09/07/23 37.69 kg/m     Physical Exam General: Sitting in chair, no acute distress Eyes: EOMI, no icterus Neck: Supple, no JVP appreciated Pulmonary: Clear, normal work of breathing Cardiovascular: Warm, trace to 1+ pitting edema b/l Abdomen: Nondistended, sounds  present MSK: No synovitis, no joint effusion Neuro: Normal gait, no weakness Psych: Normal mood, full affect  Assessment & Plan:   Severe persistent asthma: On triple inhaled therapy via Breztri  and Arnuity for additional ICS therapy.  Still with shortness of breath, cough.  Worse when outdoors.  Some relief with albuterol .  Suspect difficult to control with inhalers given elevated IgE in the past.  She reports adverse reaction to Xolair  including itching, musculoskeletal pain.  Eosinophils not elevated.  I do think there is more room to improve her symptom control but she is resistant to Biologics, Tezspire would be her option at this time.  Encouraged ongoing allergy /immunology follow-up.  Also discussed thrice weekly azithromycin but given her issues with erythromycin in the past this likely is not a good option.  Dyspnea on exertion:  Suspect multifactorial and related to deconditioning, she is active but less active than prior, habitus, poorly controlled asthma.  Chest imaging in the past has been clear.  Low suspicion for additional parenchymal disease or other pulmonary contributors.  TTE in 05/2022 did demonstrate diastolic dysfunction, she has lower extremity swelling so cardiac contributors are certainly possible if not likely.  PFTs largely normal which is reassuring.  No other contributors from the lungs that can be identified. CT scan for further evaluation, hi resolution.   Nasal congestion: Discussed referral to ENT in the future to see if there are other things that can add to help with her significant nasal congestion, rhinorrhea, postnasal drip contributing to cough.   Return in about 2 months (around 02/01/2024) for f/u Dr. Marygrace Snellen.   Guerry Leek, MD 12/02/2023

## 2023-12-02 NOTE — Patient Instructions (Signed)
 Nice to see you again  No changes to medication  Will get a CT scan to make sure were not missing anything  We should continue to consider Tezspire, a new injectable medicine to treat asthma if the CT scan is clear  Return to clinic in 2 months or sooner as needed with Dr. Marygrace Snellen

## 2023-12-03 ENCOUNTER — Other Ambulatory Visit: Payer: Self-pay | Admitting: Cardiology

## 2023-12-14 ENCOUNTER — Ambulatory Visit
Admission: RE | Admit: 2023-12-14 | Discharge: 2023-12-14 | Disposition: A | Discharge status: Home / Self Care | Source: Ambulatory Visit | Attending: Pulmonary Disease | Admitting: Pulmonary Disease

## 2023-12-14 DIAGNOSIS — R0609 Other forms of dyspnea: Secondary | ICD-10-CM

## 2023-12-14 DIAGNOSIS — R942 Abnormal results of pulmonary function studies: Secondary | ICD-10-CM

## 2023-12-15 ENCOUNTER — Telehealth: Payer: Self-pay

## 2023-12-16 ENCOUNTER — Other Ambulatory Visit: Payer: Self-pay

## 2023-12-16 MED ORDER — IPRATROPIUM-ALBUTEROL 0.5-2.5 (3) MG/3ML IN SOLN
RESPIRATORY_TRACT | 1 refills | Status: AC
Start: 2023-12-16 — End: ?

## 2023-12-16 NOTE — Telephone Encounter (Signed)
 Done. Called in to [place]

## 2023-12-29 ENCOUNTER — Other Ambulatory Visit: Payer: Self-pay | Admitting: Allergy

## 2024-01-02 NOTE — Telephone Encounter (Signed)
 Refill for Breztri  x 1 with 4 refills sent to [facility].

## 2024-01-16 ENCOUNTER — Other Ambulatory Visit: Payer: Self-pay | Admitting: Allergy

## 2024-01-23 ENCOUNTER — Other Ambulatory Visit: Payer: Self-pay | Admitting: Cardiology

## 2024-02-23 ENCOUNTER — Ambulatory Visit: Admitting: Pulmonary Disease

## 2024-02-23 ENCOUNTER — Encounter (INDEPENDENT_AMBULATORY_CARE_PROVIDER_SITE_OTHER): Payer: Self-pay

## 2024-02-23 ENCOUNTER — Encounter: Payer: Self-pay | Admitting: Pulmonary Disease

## 2024-02-23 VITALS — BP 123/72 | HR 80 | Temp 97.9°F | Ht 59.0 in | Wt 189.8 lb

## 2024-02-23 DIAGNOSIS — J4550 Severe persistent asthma, uncomplicated: Secondary | ICD-10-CM

## 2024-02-23 DIAGNOSIS — J329 Chronic sinusitis, unspecified: Secondary | ICD-10-CM

## 2024-02-23 DIAGNOSIS — R0609 Other forms of dyspnea: Secondary | ICD-10-CM | POA: Diagnosis not present

## 2024-02-23 NOTE — Patient Instructions (Signed)
 Neck to see you again  No change in the medication  Consider the medication, the injection for asthma, called Tezspire.  The CT scan is clear but did show signs of asthma like your other CT scans and PFTs have in the past  Return to clinic in 6 months or sooner as needed with Dr. Annella

## 2024-02-23 NOTE — Progress Notes (Signed)
 @Patient  ID: Kendra Gilbert, female    DOB: May 01, 1956, 68 y.o.   MRN: [id number]  Chief Complaint  Patient presents with   Follow-up    Asthma and DOE f/u Pt states breathing has been more challenging since LOV.  CT: 12/14/23    Referring provider: Katina Pfeiffer, PA-C  HPI:   68 y.o. woman whom we are seeing in evaluation of dyspnea on exertion in the setting of severe severe persistent asthma.  Most recent cardiology note reviewed.  Symptoms largely stable.  Cough comes and goes.  Level of dyspnea increases and decreases.  Overall things like there is been a decline over time.  She is on maximal inhaler therapy as she is been resistant to injectable medicines, Biologics given Xolair  intolerance.  We discussed Tezspire again.  I do not have any way to further improve her breathing from a asthma standpoint.  Quite possible but nothing more will improve from the asthma standpoint given multifactorial nature of dyspnea.  HPI at initial visit: Patient has ongoing dyspnea on exertion.  Present for years.  May be slightly worsening over time.  She states she is active and walks every day.  However she does not walk as much as she used to.  Less distance than prior.  She is been escalated to Breztri  for triple inhaled therapy as well as Arnuity added for additional ICS therapy for asthma.  No real improvement in her breathing.  She thinks the inhalers helped some but not massively.  She does use albuterol  as needed with some improvement albeit short-lived.  No time of day when things are better or worse.  No position makes it better worse.  No seasonal or environmental factors she can identify to make things better or worse.  No other alleviating or exacerbating factors.  She recently had a CTA coronary scan 05/2022 that on my review interpretation reveals mosaicism otherwise clear lungs suspicious for small airways disease, noting limitation and not seen upper portions of the lungs.  Most  recent chest x-ray 04/2022 reviewed and interpreted as clear lungs bilaterally.  She reports being diagnosed with interstitial lung disease in 2012.  There is no evidence of this on recent cross-sectional imaging.  It sounds like she was hospitalized at that time with pneumonia, bronchitis, asthma exacerbation.  Suspect this was an acute pneumonitis or pneumonia.  Furthermore, she had a high-res CT scan 2015, I cannot review images but report reveals clear lungs and no evidence of interstitial lung disease.  For further workup of shortness of breath, her echo in 05/2022 did reveal grade 1 diastolic dysfunction but otherwise reassuring results.  She had a perfusion stress test 08/2020 that is normal.  She has had multiple lower extremity Dopplers that showed no DVT.  Notably, her labs have revealed persistently elevated IgE but none tested recently.  She was on Xolair  and reports itchiness and MSK pain so this was stopped.  She is not sure it helped her breathing at all.    Questionaires / Pulmonary Flowsheets:   ACT:  Asthma Control Test ACT Total Score  02/23/2024  9:40 AM 8  12/03/2022  8:26 AM 12  08/12/2022  8:24 AM 12    MMRC:     No data to display          Epworth:      No data to display          Tests:   FENO:  No results found for: NITRICOXIDE  PFT:  Latest Ref Rng & Units 09/15/2022    9:52 AM  PFT Results  FVC-Pre L 1.49   FVC-Predicted Pre % 58   Pre FEV1/FVC % % 93   FEV1-Pre L 1.39   FEV1-Predicted Pre % 72   DLCO uncorrected ml/min/mmHg 11.03   DLCO UNC% % 66   DLCO corrected ml/min/mmHg 11.60   DLCO COR %Predicted % 70   DLVA Predicted % 106   TLC L 3.88   TLC % Predicted % 90   RV % Predicted % 110   Reviewed interpreted spirometry just of moderate restriction versus air trapping.  Bronchodilators not administered.  TLC within normal limits, air trapping present consistent with small airways disease, DLCO is mildly reduced but likely  underestimated given lung volume used is less than 75% of TLC reported and lung volumes.  WALK:     03/02/2016    8:59 AM  SIX MIN WALK  Supplimental Oxygen during Test? (L/min) No  Tech Comments: brisk pace/no SOB//lmr    Imaging: Personally reviewed and as per EMR and discussion in this note No results found.  Lab Results: Personally reviewed CBC    Component Value Date/Time   WBC 9.5 04/12/2023 0824   RBC 4.42 04/12/2023 0824   HGB 11.8 04/12/2023 0824   HGB 12.8 08/18/2020 1510   HCT 37.7 04/12/2023 0824   HCT 39.1 08/18/2020 1510   PLT 218 04/12/2023 0824   PLT 171 08/18/2020 1510   MCV 85.3 04/12/2023 0824   MCV 85 08/18/2020 1510   MCH 26.7 (L) 04/12/2023 0824   MCHC 31.3 (L) 04/12/2023 0824   RDW 13.5 04/12/2023 0824   RDW 13.3 08/18/2020 1510   LYMPHSABS 1,435 04/12/2023 0824   LYMPHSABS 1.6 10/17/2019 1134   MONOABS 0.5 08/12/2022 0855   EOSABS 38 04/12/2023 0824   EOSABS 0.1 10/17/2019 1134   BASOSABS 19 04/12/2023 0824   BASOSABS 0.0 10/17/2019 1134    BMET    Component Value Date/Time   NA 143 04/12/2023 0824   NA 141 05/05/2022 0825   K 3.6 04/12/2023 0824   CL 104 04/12/2023 0824   CO2 29 04/12/2023 0824   GLUCOSE 102 (H) 04/12/2023 0824   BUN 28 (H) 04/12/2023 0824   BUN 32 (H) 05/05/2022 0825   CREATININE 1.06 (H) 04/12/2023 0824   CALCIUM  9.6 04/12/2023 0824   GFRNONAA >60 04/20/2022 0843   GFRAA 99 08/18/2020 1510    BNP    Component Value Date/Time   BNP 70.3 04/20/2022 0843    ProBNP No results found for: PROBNP  Specialty Problems       Pulmonary Problems   Wheezing   Sleep apnea   Upper airway cough syndrome   Followed in Pulmonary clinic/ [facility]/ Wert rec hs h2 and h1 08/17/13 > and 01/20/16 > did not take h1 as directed, did not take ppi ac as rec - Allergy  profile 03/02/16>  Eos 0.0/  IgE  483  Grass > tree > Ragweed, dog and dust - Sinus CT 03/09/2016 > Negative exam.  No evidence of acute or chronic  sinus disease. - trial of singulair  10 mg 04/01/2016       Dyspnea   03/02/2016  Walked RA x 3 laps @ 185 ft each stopped due to  End of study,fast pace, no sob or desat   - Spirometry 03/02/2016  Suboptimal effort on the early portion of f/v but still had nl flows       Multiple pulmonary  nodules determined by computed tomography of lung   See CT chest  12/25/15 largest = 3 mm/ never smoker > no f/u rec by Fleischner society guidelines         Sinusitis   Other seasonal allergic rhinitis    Allergies  Allergen Reactions   Amlodipine  Shortness Of Breath and Swelling   Bee Venom Anaphylaxis and Hives   Lisinopril  Itching and Cough    Dry cough and severe itching in the throat    Other Other (See Comments)    Mosquito bites cause hives all over body   Aspirin Other (See Comments)    Causes stomach problems   Cefdinir     Other reaction(s): hives   Erythromycin Other (See Comments)    Ate the lining of her stomach   Hctz [Hydrochlorothiazide ]     Rash    Penicillins Rash    Has patient had a PCN reaction causing immediate rash, facial/tongue/throat swelling, SOB or lightheadedness with hypotension: Yes Has patient had a PCN reaction causing severe rash involving mucus membranes or skin necrosis: Yes Has patient had a PCN reaction that required hospitalization: No  Has patient had a PCN reaction occurring within the last 10 years: No If all of the above answers are NO, then may proceed with Cephalosporin use.     Immunization History  Administered Date(s) Administered   Influenza, High Dose Seasonal PF 08/05/2022   Influenza,inj,Quad PF,6+ Mos 05/20/2021   Moderna Sars-Covid-2 Vaccination 10/02/2019, 10/30/2019, 05/05/2020, 10/13/2020    Past Medical History:  Diagnosis Date   Asthma    CAD (coronary artery disease)    Moderate, nonobstructive CAD as seen on coronary CTA in 2020 with normal FFR.   Cataracts, bilateral    Chronic kidney disease    cyst in left  kidney   Class 2 obesity    Colon cancer [facility]) 2017   surgical tx  removal of polyp   Concussion 04/20/2021   Coronary artery calcification    a. POET in 2015 with no acute findings.   Diarrhea since 02-16-17   c dif test normal per pt   Dyspnea     with exertion, occ at rest no oxygen or inhaler use   GERD (gastroesophageal reflux disease)    H/O: rheumatic fever as child   Headache    occ left side migraine   Hyperlipidemia    Hypertension    Leg edema    Lung disease    Lung nodule   Non-alcoholic cirrhosis (HCC) 2013   no current issues with   Pneumonia 2016   Rheumatoid arthritis (HCC)    oa also, back arthritis, hx rheumatoid     Tobacco History: Social History   Tobacco Use  Smoking Status Never   Passive exposure: Never  Smokeless Tobacco Never   Counseling given: Not Answered   Continue to not smoke  Outpatient Encounter Medications as of 02/23/2024  Medication Sig   acetaminophen  (TYLENOL ) 500 MG tablet Take 325 mg by mouth daily as needed for mild pain, fever or headache.   ARNUITY ELLIPTA  200 MCG/ACT AEPB Inhale 1 puff into the lungs daily.   azelastine  (OPTIVAR ) 0.05 % ophthalmic solution Place 1 drop into both eyes 2 (two) times daily as needed (Itchy, watery eyes).   BREZTRI  AEROSPHERE 160-9-4.8 MCG/ACT AERO inhaler INHALE 2 PUFFS INTO THE LUNGS TWICE DAILY TO PREVENT COUGH OR WHEEZE AND RINSE MOUTH AFTERWARDS   Carbinoxamine  Maleate 4 MG TABS 1 tab 1-2 times a day  diphenhydrAMINE (BENADRYL) 25 MG tablet Take 25 mg by mouth every 6 (six) hours as needed.   EPINEPHrine  0.3 mg/0.3 mL IJ SOAJ injection Inject 0.3 mg into the muscle as needed for anaphylaxis.   furosemide  (LASIX ) 40 MG tablet TAKE 1 TABLET BY MOUTH DAILY AS NEEDED FOR WEIGHT GAIN OF 3LBS OVERNIGHT OR 5LBS IN 1 WEEK   ipratropium-albuterol  (DUONEB) 0.5-2.5 (3) MG/3ML SOLN Use 1 vial via nebulizer every 4-6 hours as needed for cough, wheeze, shortness of breath or chest tightness    isosorbide  mononitrate (IMDUR ) 30 MG 24 hr tablet Take 1 tablet (30 mg total) by mouth daily.   levocetirizine (XYZAL ) 5 MG tablet TAKE 1 TABLET(5 MG) BY MOUTH EVERY EVENING   losartan  (COZAAR ) 25 MG tablet TAKE 1 TABLET(25 MG) BY MOUTH IN THE MORNING AND AT BEDTIME   nitroGLYCERIN  (NITROSTAT ) 0.4 MG SL tablet Place 1 tablet (0.4 mg total) under the tongue every 5 (five) minutes as needed for chest pain.   pantoprazole  (PROTONIX ) 20 MG tablet Take 40 mg by mouth daily.   pregabalin  (LYRICA ) 50 MG capsule Take 1 capsule (50 mg total) by mouth 2 (two) times daily.   RESTASIS 0.05 % ophthalmic emulsion 1 drop 2 (two) times daily.   rosuvastatin  (CRESTOR ) 20 MG tablet Take 1 tablet (20 mg total) by mouth daily.   Sodium Chloride -Sodium Bicarb 1.57 g PACK Place into the nose.   VENTOLIN  HFA 108 (90 Base) MCG/ACT inhaler Inhale 2 puffs into the lungs every 4 (four) hours as needed for wheezing or shortness of breath.   No facility-administered encounter medications on file as of 02/23/2024.     Review of Systems  Review of Systems  N/a Physical Exam  BP 123/72   Pulse 80   Temp 97.9 F (36.6 C)   Ht 4' 11 (1.499 m)   Wt 189 lb 12.8 oz (86.1 kg)   SpO2 98% Comment: RA  BMI 38.33 kg/m   Wt Readings from Last 5 Encounters:  02/23/24 189 lb 12.8 oz (86.1 kg)  12/02/23 187 lb (84.8 kg)  11/17/23 184 lb 6.4 oz (83.6 kg)  10/04/23 186 lb (84.4 kg)  09/08/23 185 lb 9.6 oz (84.2 kg)    BMI Readings from Last 5 Encounters:  02/23/24 38.33 kg/m  12/02/23 37.77 kg/m  11/17/23 40.34 kg/m  10/04/23 37.57 kg/m  09/08/23 37.49 kg/m     Physical Exam General: Sitting in chair, no acute distress Eyes: EOMI, no icterus Neck: Supple, no JVP appreciated Pulmonary: Clear, normal work of breathing Cardiovascular: Warm, trace to 1+ pitting edema b/l Abdomen: Nondistended, sounds present MSK: No synovitis, no joint effusion Neuro: Normal gait, no weakness Psych: Normal mood, full  affect  Assessment & Plan:   Severe persistent asthma: On triple inhaled therapy via Breztri  and Arnuity for additional ICS therapy.  Still with shortness of breath, cough.  Worse when outdoors.  Some relief with albuterol .  Suspect difficult to control with inhalers given elevated IgE in the past.  She reports adverse reaction to Xolair  including itching, musculoskeletal pain.  Eosinophils not elevated.  I do think there is more room to improve her symptom control but she is resistant to Biologics, Tezspire would be her option at this time.  She is resistant to this.  I have nothing further to add to for asthma.  Dyspnea on exertion: Suspect multifactorial and related to deconditioning, she is active but less active than prior, habitus, poorly controlled asthma, cardiac abnormalities.  Chest imaging in  the past has been clear.  Low suspicion for additional parenchymal disease or other pulmonary contributors.  TTE in 05/2022 did demonstrate diastolic dysfunction, she has lower extremity swelling so cardiac contributors are certainly possible if not likely.  PFTs largely normal which is reassuring.  CT high-resolution 12/2023 clear without ILD.  No other contributors from the lungs that can be identified.  Without additional biologic therapy nothing more to offer from a asthma standpoint.  I encouraged her to follow-up with cardiology for further evaluation.  Nasal congestion: Referral to ENT today.   Return in about 6 months (around 08/25/2024) for f/u Dr. Annella.   Donnice JONELLE Annella, MD 02/23/2024

## 2024-03-07 ENCOUNTER — Other Ambulatory Visit: Payer: Self-pay | Admitting: Urology

## 2024-03-07 DIAGNOSIS — N281 Cyst of kidney, acquired: Secondary | ICD-10-CM

## 2024-03-09 ENCOUNTER — Telehealth: Payer: Self-pay | Admitting: Cardiology

## 2024-03-09 NOTE — Telephone Encounter (Signed)
 Pt c/o swelling/edema: STAT if pt has developed SOB within 24 hours  If swelling, where is the swelling located?   Edema around ankles and legs  How much weight have you gained and in what time span?   Yes  Have you gained 2 pounds in a day or 5 pounds in a week?   2-3 pounds in a week  Do you have a log of your daily weights (if so, list)?   Last week  189 pounds  Are you currently taking a fluid pill?   Yes  Are you currently SOB?   SOB  Have you traveled recently in a car or plane for an extended period of time?   No  Patient is concerned she is still having swelling and wants advice on next steps.

## 2024-03-09 NOTE — Telephone Encounter (Signed)
 Spoke with Kendra Gilbert who complains of increasing LEE over the past 5-6 weeks with increasing SOB.  Kendra Gilbert states she has recently seen pulmonology for asthma who recommended she contact [facility].  Current BP 108/55 HR-89.  Kendra Gilbert reports she has been taking Furosemide  40mg  daily for over a year.  Weight is up and down with a gain at times of 2-3 pounds.  She denies current CP or dizziness. Following healthy diet of mostly fruits and vegetables with occasional meat. Offered Kendra Gilbert appointment 03/20/2024 with Orren Fabry, PA-C.  Kendra Gilbert declines.  She would like for Dr Lavona to review and make further recommendation.  Kendra Gilbert advised will forward to provider as requested.  Kendra Gilbert thanked Charity fundraiser for the callback.

## 2024-03-09 NOTE — Telephone Encounter (Signed)
 Left voice message to call back 9/5

## 2024-03-12 NOTE — Progress Notes (Unsigned)
 Cardiology Office Note:    Date:  03/12/2024   ID:  Kendra Gilbert, DOB 12/20/1955, MRN [id number]  PCP:  Katina Pfeiffer, PA-C   [facility] Providers Cardiologist:  Lynwood Schilling, MD { Click to update primary MD,subspecialty MD or APP then REFRESH:1}    Referring MD: Katina Pfeiffer, PA-C   No chief complaint on file. ***  History of Present Illness:    Kendra Gilbert is a 68 y.o. female with a hx of hypertension intolerant to lisinopril , nonobstructive CAD noted on coronary CTA 04/2022 that showed a coronary calcium  score of 331 placing her at the 92nd percentile with moderate CAD in the proximal LAD and negative FFR.  This was in the setting of negative Lexiscan  Myoview  obtained February 2022.  Her ongoing chest pain was managed with Imdur .  She also has chronic shortness of breath and follows with pulmonology for asthma.  She has prior lower extremity swelling controlled with Lasix .  Echocardiogram in 2023 showed preserved LVEF 60-65%, mild LVH, grade 1 DD, and no significant valvular disease.  She called our office with concern for lower extremity swelling ongoing for 5 to 6 weeks and was advised to be seen in the clinic for evaluation.   LE edema - has been taking 40 mg Lasix  daily - ?repeat echo   Hypertension - continue 25 mg losartan , 30 mg imdur    Nonobstructive CAD - CTA coronary with FFR negative disease in the LAD - risk factor modification   Follow up with Dr. Schilling or myself in 2 weeks.          Past Medical History:  Diagnosis Date   Asthma    CAD (coronary artery disease)    Moderate, nonobstructive CAD as seen on coronary CTA in 2020 with normal FFR.   Cataracts, bilateral    Chronic kidney disease    cyst in left kidney   Class 2 obesity    Colon cancer [facility]) 2017   surgical tx  removal of polyp   Concussion 04/20/2021   Coronary artery calcification    a. POET in 2015 with no acute findings.   Diarrhea since  02-16-17   c dif test normal per pt   Dyspnea     with exertion, occ at rest no oxygen or inhaler use   GERD (gastroesophageal reflux disease)    H/O: rheumatic fever as child   Headache    occ left side migraine   Hyperlipidemia    Hypertension    Leg edema    Lung disease    Lung nodule   Non-alcoholic cirrhosis (HCC) 2013   no current issues with   Pneumonia 2016   Rheumatoid arthritis (HCC)    oa also, back arthritis, hx rheumatoid     Past Surgical History:  Procedure Laterality Date   ABDOMINAL HYSTERECTOMY     ovaries left   ANKLE SURGERY Left 02/12/2021   Arthritis Surgery Right 04/20/2023   COLONOSCOPY WITH PROPOFOL  N/A 03/23/2017   Procedure: COLONOSCOPY WITH PROPOFOL ;  Surgeon: Burnette Fallow, MD;  Location: [facility];  Service: Endoscopy;  Laterality: N/A;   colonscopy  2017   DILATION AND CURETTAGE OF UTERUS     few done for endometriosis   GASTROC RECESSION EXTREMITY Left 02/12/2021   Procedure: Left gastroc recession;  Surgeon: Kit Rush, MD;  Location: [facility];  Service: Orthopedics;  Laterality: Left;   ROTATOR CUFF REPAIR Left 09/2021   athroscopic and open    Current Medications: No outpatient  medications have been marked as taking for the 03/14/24 encounter (Appointment) with Madie Jon Garre, PA.     Allergies:   Amlodipine , Bee venom, Lisinopril , Other, Aspirin, Cefdinir, Erythromycin, Hctz [hydrochlorothiazide ], and Penicillins   Social History   Socioeconomic History   Marital status: Single    Spouse name: Not on file   Number of children: Not on file   Years of education: Not on file   Highest education level: Not on file  Occupational History   Not on file  Tobacco Use   Smoking status: Never    Passive exposure: Never   Smokeless tobacco: Never  Vaping Use   Vaping status: Never Used  Substance and Sexual Activity   Alcohol use: Never   Drug use: Never   Sexual activity: Not Currently    Birth  control/protection: Surgical  Other Topics Concern   Not on file  Social History Narrative   Lives alone.     Are you right handed or left handed? Right   Are you currently employed ? retire      Do you live at home alone?with sister in law      What type of home do you live in: 1 story or 2 story? one       Social Drivers of Corporate investment banker Strain: Not on file  Food Insecurity: Not on file  Transportation Needs: Not on file  Physical Activity: Not on file  Stress: Not on file  Social Connections: Unknown (11/15/2021)   Received from [employer]   Social Network    Social Network: Not on file     Family History: The patient's ***family history includes Allergies in her father; Asthma in her father; Breast cancer in her mother; CAD in her brother; CAD (age of onset: 52) in her brother, father, and mother; CAD (age of onset: 52) in her brother; Heart attack in her brother; Kidney failure in her mother.  ROS:   Please see the history of present illness.    *** All other systems reviewed and are negative.  EKGs/Labs/Other Studies Reviewed:    The following studies were reviewed today: ***      Recent Labs: 04/12/2023: ALT 21; BUN 28; Creat 1.06; Hemoglobin 11.8; Platelets 218; Potassium 3.6; Sodium 143  Recent Lipid Panel    Component Value Date/Time   CHOL 136 07/12/2022 0818   TRIG 95 07/12/2022 0818   HDL 55 07/12/2022 0818   CHOLHDL 2.5 07/12/2022 0818   CHOLHDL 3.3 02/06/2016 0930   VLDL 19 02/06/2016 0930   LDLCALC 63 07/12/2022 0818   LDLDIRECT 135.2 08/23/2013 1142     Risk Assessment/Calculations:   {Does this patient have ATRIAL FIBRILLATION?:[phone]}  No BP recorded.  {Refresh Note OR Click here to enter BP  :1}***         Physical Exam:    VS:  There were no vitals taken for this visit.    Wt Readings from Last 3 Encounters:  02/23/24 189 lb 12.8 oz (86.1 kg)  12/02/23 187 lb (84.8 kg)  11/17/23 184 lb 6.4 oz (83.6 kg)      GEN: *** Well nourished, well developed in no acute distress HEENT: Normal NECK: No JVD; No carotid bruits LYMPHATICS: No lymphadenopathy CARDIAC: ***RRR, no murmurs, rubs, gallops RESPIRATORY:  Clear to auscultation without rales, wheezing or rhonchi  ABDOMEN: Soft, non-tender, non-distended MUSCULOSKELETAL:  No edema; No deformity  SKIN: Warm and dry NEUROLOGIC:  Alert and oriented x 3 PSYCHIATRIC:  Normal affect   ASSESSMENT:    No diagnosis found. PLAN:    In order of problems listed above:  ***      {Are you ordering a CV Procedure (e.g. stress test, cath, DCCV, TEE, etc)?   Press F2        :[id number]}    Medication Adjustments/Labs and Tests Ordered: Current medicines are reviewed at length with the patient today.  Concerns regarding medicines are outlined above.  No orders of the defined types were placed in this encounter.  No orders of the defined types were placed in this encounter.   There are no Patient Instructions on file for this visit.   Signed, Jon Nat Hails, [place]  03/12/2024 4:57 PM    [facility]

## 2024-03-12 NOTE — Telephone Encounter (Signed)
Spoke with the patient and scheduled her for an appointment.  

## 2024-03-12 NOTE — Telephone Encounter (Signed)
 Patient is returning call.

## 2024-03-14 ENCOUNTER — Ambulatory Visit: Attending: Cardiovascular Disease | Admitting: Physician Assistant

## 2024-03-14 ENCOUNTER — Encounter: Payer: Self-pay | Admitting: Physician Assistant

## 2024-03-14 ENCOUNTER — Ambulatory Visit
Admission: RE | Admit: 2024-03-14 | Discharge: 2024-03-14 | Disposition: A | Discharge status: Home / Self Care | Source: Ambulatory Visit | Attending: Urology | Admitting: Urology

## 2024-03-14 VITALS — BP 124/72 | HR 80 | Ht 59.0 in | Wt 190.2 lb

## 2024-03-14 DIAGNOSIS — N281 Cyst of kidney, acquired: Secondary | ICD-10-CM

## 2024-03-14 DIAGNOSIS — I10 Essential (primary) hypertension: Secondary | ICD-10-CM | POA: Insufficient documentation

## 2024-03-14 DIAGNOSIS — J45909 Unspecified asthma, uncomplicated: Secondary | ICD-10-CM | POA: Insufficient documentation

## 2024-03-14 DIAGNOSIS — R0609 Other forms of dyspnea: Secondary | ICD-10-CM | POA: Diagnosis not present

## 2024-03-14 DIAGNOSIS — R609 Edema, unspecified: Secondary | ICD-10-CM | POA: Insufficient documentation

## 2024-03-14 DIAGNOSIS — E785 Hyperlipidemia, unspecified: Secondary | ICD-10-CM | POA: Insufficient documentation

## 2024-03-14 DIAGNOSIS — I517 Cardiomegaly: Secondary | ICD-10-CM | POA: Diagnosis not present

## 2024-03-14 DIAGNOSIS — I2510 Atherosclerotic heart disease of native coronary artery without angina pectoris: Secondary | ICD-10-CM | POA: Diagnosis present

## 2024-03-14 MED ORDER — GADOPICLENOL 0.5 MMOL/ML IV SOLN
8.0000 mL | Freq: Once | INTRAVENOUS | Status: AC | PRN
  Administered 2024-03-14: 8 mL via INTRAVENOUS

## 2024-03-14 NOTE — Patient Instructions (Addendum)
 Medication Instructions:  Your physician recommends that you continue on your current medications as directed. Please refer to the Current Medication list given to you today.  *If you need a refill on your cardiac medications before your next appointment, please call your pharmacy*  Lab Work: CBC, BMP today  Testing/Procedures: Your physician has requested that you have an echocardiogram. Echocardiography is a painless test that uses sound waves to create images of your heart. It provides your doctor with information about the size and shape of your heart and how well your heart's chambers and valves are working. This procedure takes approximately one hour. There are no restrictions for this procedure. Please do NOT wear cologne, perfume, aftershave, or lotions (deodorant is allowed). Please arrive 15 minutes prior to your appointment time.  Please note: We ask at that you not bring children with you during ultrasound (echo/ vascular) testing. Due to room size and safety concerns, children are not allowed in the ultrasound rooms during exams. Our front office staff cannot provide observation of children in our lobby area while testing is being conducted. An adult accompanying a patient to their appointment will only be allowed in the ultrasound room at the discretion of the ultrasound technician under special circumstances. We apologize for any inconvenience.   Follow-Up: At [facility], you and your health needs are our priority.  As part of our continuing mission to provide you with exceptional heart care, our providers are all part of one team.  This team includes your primary Cardiologist (physician) and Advanced Practice Providers or APPs (Physician Assistants and Nurse Practitioners) who all work together to provide you with the care you need, when you need it.  Your next appointment:   4 week(s)  Provider:   Dr. Lavona or Jon Hails, PA-C          We recommend signing up  for the patient portal called MyChart.  Sign up information is provided on this After Visit Summary.  MyChart is used to connect with patients for Virtual Visits (Telemedicine).  Patients are able to view lab/test results, encounter notes, upcoming appointments, etc.  Non-urgent messages can be sent to your provider as well.   To learn more about what you can do with MyChart, go to [email or web address].   Other Instructions

## 2024-03-15 ENCOUNTER — Other Ambulatory Visit: Payer: Self-pay | Admitting: Family Medicine

## 2024-03-15 DIAGNOSIS — Z1231 Encounter for screening mammogram for malignant neoplasm of breast: Secondary | ICD-10-CM

## 2024-03-15 LAB — CBC
Hematocrit: 34.9 % (ref 34.0–46.6)
Hemoglobin: 10.6 g/dL — ABNORMAL LOW (ref 11.1–15.9)
MCH: 26.2 pg — ABNORMAL LOW (ref 26.6–33.0)
MCHC: 30.4 g/dL — ABNORMAL LOW (ref 31.5–35.7)
MCV: 86 fL (ref 79–97)
Platelets: 224 x10E3/uL (ref 150–450)
RBC: 4.04 x10E6/uL (ref 3.77–5.28)
RDW: 14 % (ref 11.7–15.4)
WBC: 8.5 x10E3/uL (ref 3.4–10.8)

## 2024-03-15 LAB — BRAIN NATRIURETIC PEPTIDE: BNP: 71.2 pg/mL (ref 0.0–100.0)

## 2024-03-16 ENCOUNTER — Ambulatory Visit (HOSPITAL_COMMUNITY)
Admission: RE | Admit: 2024-03-16 | Discharge: 2024-03-16 | Disposition: A | Discharge status: Home / Self Care | Source: Ambulatory Visit | Attending: Cardiology | Admitting: Cardiology

## 2024-03-16 ENCOUNTER — Ambulatory Visit: Payer: Self-pay | Admitting: Physician Assistant

## 2024-03-16 DIAGNOSIS — R0609 Other forms of dyspnea: Secondary | ICD-10-CM | POA: Diagnosis present

## 2024-03-16 DIAGNOSIS — I10 Essential (primary) hypertension: Secondary | ICD-10-CM | POA: Insufficient documentation

## 2024-03-16 DIAGNOSIS — I517 Cardiomegaly: Secondary | ICD-10-CM | POA: Insufficient documentation

## 2024-03-16 LAB — ECHOCARDIOGRAM COMPLETE
Area-P 1/2: 4.35 cm2
S' Lateral: 2.4 cm

## 2024-03-20 ENCOUNTER — Encounter (INDEPENDENT_AMBULATORY_CARE_PROVIDER_SITE_OTHER): Payer: Self-pay | Admitting: Otolaryngology

## 2024-03-20 ENCOUNTER — Ambulatory Visit (INDEPENDENT_AMBULATORY_CARE_PROVIDER_SITE_OTHER): Admitting: Otolaryngology

## 2024-03-20 VITALS — BP 107/67 | HR 123 | Ht 59.0 in | Wt 190.0 lb

## 2024-03-20 DIAGNOSIS — J342 Deviated nasal septum: Secondary | ICD-10-CM

## 2024-03-20 DIAGNOSIS — J3489 Other specified disorders of nose and nasal sinuses: Secondary | ICD-10-CM | POA: Diagnosis not present

## 2024-03-20 DIAGNOSIS — J343 Hypertrophy of nasal turbinates: Secondary | ICD-10-CM | POA: Diagnosis not present

## 2024-03-20 DIAGNOSIS — H9202 Otalgia, left ear: Secondary | ICD-10-CM | POA: Diagnosis not present

## 2024-03-20 DIAGNOSIS — R0981 Nasal congestion: Secondary | ICD-10-CM | POA: Diagnosis not present

## 2024-03-20 DIAGNOSIS — J302 Other seasonal allergic rhinitis: Secondary | ICD-10-CM

## 2024-03-20 NOTE — Progress Notes (Signed)
 Dear Dr. Annella, Here is my assessment for our mutual patient, Senora Lacson. Thank you for allowing me the opportunity to care for your patient. Please do not hesitate to contact me should you have any other questions. Sincerely, Dr. Eldora Blanch  Otolaryngology Clinic Note  HISTORY: Kendra Gilbert is a 68 y.o. female kindly referred by Dr. Annella for evaluation of nasal congestion and ear discomfort.  Initial visit (03/2024): Noted fair amount of nasal congestion which is chronic, but worse in the summer when it gets humid. Getting worse. She feels like the right nasal passage is more congested than left, and occurs all the time. Does not have frequent sinus infections - clear drainage (A/P rhinorrhea), but no discolored drainage, facial pressure. Sense of smell she reports is heightened. She reports that temp/pressure/perfumes do trigger her symptoms a fair amount. No frequent abx/steroids.  She does have some bilateral ear discomfort but left is worse than right and also has some left neck pain as well. No drainage, no history of ear infection, vertigo; does have some fullness and muffling when nasal symptoms get worse but no hearing change otherwise.    She is currently on astelin . She has tried [employer] before and now does PRN. Does not use nasal decongestant. She is on Carbinoxamine . Improvement occurred with nothing. Allergy  testing has been done. No previous sinonasal surgery.  GLP-1: no AP/AC: no  Tobacco: no  PMHx: CAD, CKD, HTN, Asthma, HLD  RADIOGRAPHIC EVALUATION AND INDEPENDENT REVIEW OF OTHER RECORDS:: Dr. Annella (02/23/2024): noted severe persistent asthma, DOE, Cough comes and goes. Noted multifactorial dyspnea, did not wish for xolair ; on triple inhaled therapy; noted nasal congestion, refer to ENT Dr. Jeneal 11/17/2023 (Allergy ): noted severe asthma, allergic rhinitis, vasomotor rhinitis; noted typical AR symptoms; Dx: Asthma, AR: on carbinoxamine ,  astelin , singulair , bepreve, breztri , anrniuty RAST 8/29/017: Eos 0, IgE 483, + Grass, tree, ragweed, dog and dust CBC w/diff and CMP 04/12/2023: WBC 9.5, Eos 38; BUN/Cr 28/1.06 CT Head 03/02/2022 independently interpreted with respect to sinuses: septum deviates left, bilateral right > left inferior turbinate hypertrophy; paranasal sinuses generally clear. Mastoids and ME well aerated  Past Medical History:  Diagnosis Date   Asthma    CAD (coronary artery disease)    Moderate, nonobstructive CAD as seen on coronary CTA in 2020 with normal FFR.   Cataracts, bilateral    Chronic kidney disease    cyst in left kidney   Class 2 obesity    Colon cancer [facility]) 2017   surgical tx  removal of polyp   Concussion 04/20/2021   Coronary artery calcification    a. POET in 2015 with no acute findings.   Diarrhea since 02-16-17   c dif test normal per pt   Dyspnea     with exertion, occ at rest no oxygen or inhaler use   GERD (gastroesophageal reflux disease)    H/O: rheumatic fever as child   Headache    occ left side migraine   Hyperlipidemia    Hypertension    Leg edema    Lung disease    Lung nodule   Non-alcoholic cirrhosis (HCC) 2013   no current issues with   Pneumonia 2016   Rheumatoid arthritis (HCC)    oa also, back arthritis, hx rheumatoid    Past Surgical History:  Procedure Laterality Date   ABDOMINAL HYSTERECTOMY     ovaries left   ANKLE SURGERY Left 02/12/2021   Arthritis Surgery Right 04/20/2023   COLONOSCOPY WITH PROPOFOL  N/A  03/23/2017   Procedure: COLONOSCOPY WITH PROPOFOL ;  Surgeon: Burnette Fallow, MD;  Location: [facility];  Service: Endoscopy;  Laterality: N/A;   colonscopy  2017   DILATION AND CURETTAGE OF UTERUS     few done for endometriosis   GASTROC RECESSION EXTREMITY Left 02/12/2021   Procedure: Left gastroc recession;  Surgeon: Kit Rush, MD;  Location: [facility];  Service: Orthopedics;  Laterality: Left;   ROTATOR CUFF REPAIR Left  09/2021   athroscopic and open   Family History  Problem Relation Age of Onset   CAD Mother 73       CABG/stents   Breast cancer Mother    Kidney failure Mother    Asthma Father    CAD Father 76   Allergies Father    CAD Brother    CAD Brother 88       9 stents   CAD Brother 51       7 stents   Heart attack Brother    Social History   Tobacco Use   Smoking status: Never    Passive exposure: Never   Smokeless tobacco: Never  Substance Use Topics   Alcohol use: Never   Allergies  Allergen Reactions   Amlodipine  Shortness Of Breath and Swelling   Bee Venom Anaphylaxis and Hives   Lisinopril  Itching and Cough    Dry cough and severe itching in the throat    Other Other (See Comments)    Mosquito bites cause hives all over body   Aspirin Other (See Comments)    Causes stomach problems   Cefdinir     Other reaction(s): hives   Erythromycin Other (See Comments)    Ate the lining of her stomach   Hctz [Hydrochlorothiazide ]     Rash    Penicillins Rash    Has patient had a PCN reaction causing immediate rash, facial/tongue/throat swelling, SOB or lightheadedness with hypotension: Yes Has patient had a PCN reaction causing severe rash involving mucus membranes or skin necrosis: Yes Has patient had a PCN reaction that required hospitalization: No  Has patient had a PCN reaction occurring within the last 10 years: No If all of the above answers are NO, then may proceed with Cephalosporin use.    Current Outpatient Medications  Medication Sig Dispense Refill   acetaminophen  (TYLENOL ) 500 MG tablet Take 325 mg by mouth daily as needed for mild pain, fever or headache.     ARNUITY ELLIPTA  200 MCG/ACT AEPB Inhale 1 puff into the lungs daily. 30 each 5   azelastine  (OPTIVAR ) 0.05 % ophthalmic solution Place 1 drop into both eyes 2 (two) times daily as needed (Itchy, watery eyes). 6 mL 5   BREZTRI  AEROSPHERE 160-9-4.8 MCG/ACT AERO inhaler INHALE 2 PUFFS INTO THE LUNGS TWICE  DAILY TO PREVENT COUGH OR WHEEZE AND RINSE MOUTH AFTERWARDS 10.7 g 4   Carbinoxamine  Maleate 4 MG TABS 1 tab 1-2 times a day 56 tablet 5   diphenhydrAMINE (BENADRYL) 25 MG tablet Take 25 mg by mouth every 6 (six) hours as needed.     EPINEPHrine  0.3 mg/0.3 mL IJ SOAJ injection Inject 0.3 mg into the muscle as needed for anaphylaxis. 2 each 1   furosemide  (LASIX ) 40 MG tablet TAKE 1 TABLET BY MOUTH DAILY AS NEEDED FOR WEIGHT GAIN OF 3LBS OVERNIGHT OR 5LBS IN 1 WEEK 90 tablet 3   ipratropium-albuterol  (DUONEB) 0.5-2.5 (3) MG/3ML SOLN Use 1 vial via nebulizer every 4-6 hours as needed for cough, wheeze, shortness of  breath or chest tightness 180 mL 1   isosorbide  mononitrate (IMDUR ) 30 MG 24 hr tablet Take 1 tablet (30 mg total) by mouth daily. 90 tablet 3   levocetirizine (XYZAL ) 5 MG tablet TAKE 1 TABLET(5 MG) BY MOUTH EVERY EVENING 90 tablet 0   losartan  (COZAAR ) 25 MG tablet TAKE 1 TABLET(25 MG) BY MOUTH IN THE MORNING AND AT BEDTIME 180 tablet 3   nitroGLYCERIN  (NITROSTAT ) 0.4 MG SL tablet Place 1 tablet (0.4 mg total) under the tongue every 5 (five) minutes as needed for chest pain. 25 tablet 3   pantoprazole  (PROTONIX ) 20 MG tablet Take 40 mg by mouth daily.     pregabalin  (LYRICA ) 50 MG capsule Take 1 capsule (50 mg total) by mouth 2 (two) times daily. 60 capsule 5   RESTASIS 0.05 % ophthalmic emulsion 1 drop 2 (two) times daily.     rosuvastatin  (CRESTOR ) 20 MG tablet Take 1 tablet (20 mg total) by mouth daily. 90 tablet 3   Sodium Chloride -Sodium Bicarb 1.57 g PACK Place into the nose.     VENTOLIN  HFA 108 (90 Base) MCG/ACT inhaler Inhale 2 puffs into the lungs every 4 (four) hours as needed for wheezing or shortness of breath. 18 g 0   No current facility-administered medications for this visit.   BP 107/67 (BP Location: Left Arm, Patient Position: Sitting, Cuff Size: Large)   Pulse (!) 123   Ht 4' 11 (1.499 m)   Wt 190 lb (86.2 kg)   SpO2 92%   BMI 38.38 kg/m   PHYSICAL EXAM:   BP 107/67 (BP Location: Left Arm, Patient Position: Sitting, Cuff Size: Large)   Pulse (!) 123   Ht 4' 11 (1.499 m)   Wt 190 lb (86.2 kg)   SpO2 92%   BMI 38.38 kg/m    Salient findings:  CN II-XII intact Bilateral EAC clear and TM intact with well pneumatized middle ear spaces Nose: Anterior rhinoscopy reveals septum deviates right, bilateral L > R inferior turbinate hypertrophy.  Nasal endoscopy was indicated to better evaluate the nose and paranasal sinuses, given the patient's history and exam findings, and is detailed below. No lesions of oral cavity/oropharynx No obviously palpable neck masses/lymphadenopathy/thyromegaly No respiratory distress or stridor   PROCEDURE:  Prior to initiating any procedures, risks/benefits/alternatives were explained to the patient and verbal consent obtained. Diagnostic Nasal Endoscopy Pre-procedure diagnosis: Nasal congestion, nasal obstruction, allergic rhinitis Post-procedure diagnosis: same Indication: See pre-procedure diagnosis and physical exam above Complications: None apparent EBL: 0 mL Anesthesia: Lidocaine  4% and topical decongestant was topically sprayed in each nasal cavity  Description of Procedure:  Patient was identified. A rigid 30 degree endoscope was utilized to evaluate the sinonasal cavities, mucosa, sinus ostia and turbinates and septum.  Overall, signs of mucosal inflammation are mild but noted..  No mucopurulence, polyps, or masses noted.   Right Middle meatus: clear Right SE Recess: clear Left MM: clear Left SE Recess: clear Mild mucoid secretions b/l  CPT CODE -- 68768 - Mod 25   ASSESSMENT:  68 y.o. with severe asthma now with:  1. Nasal congestion   2. Hypertrophy of both inferior nasal turbinates   3. Nasal septal deviation   4. Nasal obstruction   5. Other seasonal allergic rhinitis   6. Discomfort of left ear    PLAN: We've discussed issues and options today.  We reviewed the nasal endoscopy images  together.  The risks, benefits and alternatives were discussed and questions answered.   She does  have a structural cause for nasal congestion - allergies and septal deviation with turbinate hypertrophy. She is on medical management but takes a lot of medications and not willing to use additional medications/sprays  We discussed the goals of septoplasty and turbinate reduction, and expectations for postoperative management. Will plan to leave splints in place, and removal was also discussed. We also discussed nasal obstruction post-operatively until splints in place and pain management.  We discussed R/B/A including pain, infection, bleeding (~2% risk of operative visit for control), persistent symptoms, need for revision surgery, and other risks including damage to surrounding structures, septal perforation, and injury to skull base with risk of CSF leak (very rare)  We discussed use of nasal saline spray post-op  We also discussed use of intranasal steroid post-operatively until healing occurs  Given her asthma, I explained that nasal surgery would not improve this and there is increased perioperative risk.   She will consider her options and let us  know if she'd like to proceed - In interim, use flonase  BID and astelin  BID (continue) and ideally daily nasal rinses   Ear discomfort: most likely referred from neck pain/cervical spine pain; she'll discuss with her PCP re: this  See below regarding exact medications prescribed this encounter including dosages and route: No orders of the defined types were placed in this encounter.    Thank you for allowing me the opportunity to care for your patient. Please do not hesitate to contact me should you have any other questions.  Sincerely, Eldora Blanch, MD Otolaryngologist (ENT), [facility] Phone: [phone] Fax: [phone]  MDM:  Level 4: [phone] Complexity/Problems addressed: mod - multiple chronic problems,  worsening Data complexity: high - independent interpretation of CT; review of notes, labs, test - Morbidity: low currently  - Prescription Drug prescribed or managed: no  03/20/2024, 8:37 PM

## 2024-03-20 NOTE — Patient Instructions (Addendum)
 Surgery: Endoscopic Septoplasty and Bilateral Inferior Turbinate Reduction

## 2024-03-28 NOTE — Progress Notes (Signed)
 Office Visit Note  Patient: Kendra Gilbert             Date of Birth: 1955-12-31           MRN: [id number]             PCP: Katina Pfeiffer, PA-C Referring: Katina Pfeiffer, PA-C Visit Date: 04/11/2024   Subjective:  Pain and Edema of the Right Hand, Edema and Pain of the Left Hand, and Osteoporosis   Discussed the use of AI scribe software for clinical note transcription with the patient, who gave verbal consent to proceed.  History of Present Illness   Kendra Gilbert is a 68 y.o. female here for follow up for her joint pain and history of RA with findings of erosive arthritis seen here last year.   She experiences ongoing joint pain and inflammation, particularly in her finger joints, accompanied by morning stiffness. Her symptoms worsen with weather changes, and she has not found significant relief from NSAIDs. She describes her condition as deteriorating and feels that her symptoms are not improving.  She has a history of severe asthma and heart issues, which complicate her treatment options. She is not a good candidate for surgery due to risk. 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  She experiences leg swelling, particularly on the left side, which has improved with the use of Lasix  every six hours for the past four days. She cannot wear compression socks due to issues with heat and tightness. Her BNP levels are normal, and there is no protein loss in her urine but despite normal labs she feels symptoms worsening.  She has a history of hepatic steatosis, with liver function tests being monitored. Her liver numbers have been checked multiple times, and her most recent tests are normal.  She is currently taking multiple medications and is concerned about adding more due to potential side effects and interactions. She is physically tired and notes that her 'plate is completely full' with her  current health issues.       Previous HPI 04/12/2023 Kendra Gilbert is a 68 y.o. female here for follow up for her inflammatory joint pain and history of RA with findings of erosive arthritis seen here last year.  During that time she had ulnar nerve transposition at the left elbow with a good relief of nerve impingement.  Currently is scheduled for right thumb LRTI surgery next week with Dr. Shari.  She saw Dr. Skeet for persistent headaches and peripheral numbness symptoms was recently started on Lyrica  50 mg twice daily for this.  She has not had any new recent steroid medications for inflammation and feels these usually just provide very transient improvement.  Still has persistent considerable lower extremity swelling without overlying rashes.  This is bad first thing in the morning as well as later in the day.  Takes furosemide  40 mg only some mornings because the urinary frequency is very limiting/hours after taking. Facial rash is pretty active occasionally associated with small sores or ulcers.  Also has patchy rash and bruising on her forearms without any preceding injury.  Will wake up with new spots in the morning from overnight these are initially red subsequently turned darker and yellow for eventually fading.  Not particularly painful or itchy.   Previous HPI 04/15/2022 Kendra Gilbert is a 68 y.o. female here for follow up for her inflammatory joint pain and history of RA with findings  of erosive arthritis seen here last year. She has not been on long term DMARD treatment mostly doing pretty well with use of thumb brace and notices finger joint pains flaring up with weather changes. Her new issue bringing her today is increased pain and swelling in the left leg. Pain at the ankle with radiation up to her knee and swelling that is constantly present and worse as the day goes on. She tried wearing high sock without significant improvement. She had recent PT course with a good  improvement in tendinitis. She saw Dr. Kit with [facility] for this and was referred to neurology for evaluation but waiting for this.   Previous HPI 12/19/2020 Kendra Gilbert is a 68 y.o. female here for follow up for her inflammatory joint pain and history of RA. Lab testing at initial visit was negative for RA associated antibodies or ENA panel elements not already excluded. Her sedimentation rate was quite positive at 89. Xrays reviewed did not show much inflammatory arthritis change in her feet but hands showed DIP predominant change with early central erosions consistent with erosive OA. She has previously already tried joint injection without significant benefit, oral steroids with excessive weight gain, limited tolerance for NSAIDs with GI irritation and asthma symptoms, PT and OT limited benefit, and currently wears right wrist and left ankle braces.   12/05/20 She has reported histroy of RA since many years ago not consistently on treatment. She has been noticing pain and swelling occurring in both hands both feet and ankles intermittently but can be more problematic over time.  Particular has had issues with the right elbow wrist and base of the thumb and in her left ankle which she has been seeing for posterior tibial tendon dysfunction.  She notices occasional swelling but mostly has had bony enlargement of distal finger joints but also in the right wrist and base of the thumb.  She underwent fairly recent injection of seems to be the right first [facility] joint but did not have a great amount of relief.  She has discussed with her orthopedic surgeons whether surgical management would be beneficial or if there is a recommendation for additional medical treatments of an inflammatory arthritis.  She is also been experiencing facial rashes for quite some time that she most often notices exacerbated by heat exposure such as outdoors hot showers and some increased with the mask wearing.   Review of  Systems  Constitutional:  Positive for fatigue.  HENT:  Negative for mouth sores and mouth dryness.   Eyes:  Positive for dryness.  Respiratory:  Positive for shortness of breath.   Cardiovascular:  Positive for chest pain and palpitations.  Gastrointestinal:  Positive for constipation and diarrhea. Negative for blood in stool.  Endocrine: Negative for increased urination.  Genitourinary:  Negative for involuntary urination.  Musculoskeletal:  Positive for joint pain, gait problem, joint pain, joint swelling, myalgias, muscle weakness, morning stiffness, muscle tenderness and myalgias.  Skin:  Positive for rash and sensitivity to sunlight. Negative for color change and hair loss.  Allergic/Immunologic: Positive for susceptible to infections.  Neurological:  Positive for dizziness and headaches.  Hematological:  Positive for bruising/bleeding tendency. Negative for swollen glands.  Psychiatric/Behavioral:  Negative for depressed mood and sleep disturbance. The patient is not nervous/anxious.     PMFS History:  Patient Active Problem List   Diagnosis Date Noted   Precordial chest pain 09/06/2023   Bruising 04/12/2023   CAD (coronary artery disease) 04/28/2022   Obesity 04/28/2022  Pain in left ankle and joints of left foot 04/15/2022   Dyspepsia 02/05/2022   Dysphagia 02/05/2022   Malignant tumor of colon (HCC) 02/05/2022   Other seasonal allergic rhinitis 02/05/2022   Other specified disorders of kidney and ureter 02/05/2022   Other specified symptoms and signs involving the digestive system and abdomen 02/05/2022   Peripheral venous insufficiency 02/05/2022   History of colonic polyps 02/05/2022   Primary osteoarthritis, left ankle and foot 02/05/2022   Rheumatoid arthritis (HCC) 02/05/2022   Skin sensation disturbance 02/05/2022   Leg swelling 12/15/2021   Partial thickness rotator cuff tear 09/09/2021   Impingement syndrome of left shoulder region 08/22/2021   Osteoarthritis  of acromioclavicular joint 08/22/2021   Sinusitis 08/05/2021   Pain in joint of left elbow 06/16/2021   Pain in joint of left shoulder 06/16/2021   Erosive osteoarthritis of both hands 12/19/2020   History of rheumatoid arthritis 12/05/2020   Bilateral hand pain 12/05/2020   Tibialis posterior tendinitis 06/30/2020   Pain in left foot 06/30/2020   Acquired short Achilles tendon of left lower extremity 06/30/2020   Radial styloid tenosynovitis of right hand 06/17/2020   Pain and swelling of right wrist 05/02/2020   Localized edema 08/25/2019   Educated about COVID-19 virus infection 08/25/2019   Alternating constipation and diarrhea 04/23/2019   Blood in feces 04/23/2019   Epigastric pain 04/23/2019   Gastroesophageal reflux disease 04/23/2019   Hematochezia 04/23/2019   Hypercholesterolemia 04/23/2019   Irritable bowel syndrome without diarrhea 04/23/2019   Polyp of colon 04/23/2019   Essential hypertension 02/14/2019   Fibromyalgia 01/16/2019   Primary osteoarthritis involving multiple joints 01/16/2019   DDD (degenerative disc disease), cervical 09/20/2018   Chest pain 02/17/2016   Dyslipidemia 02/17/2016   Family history of heart disease    Dyspnea 01/20/2016   Multiple pulmonary nodules determined by computed tomography of lung 01/20/2016   Unspecified osteoarthritis, unspecified site 11/16/2013   Coronary artery calcification 08/23/2013   Upper airway cough syndrome 08/19/2013   Exertional chest pain 08/19/2013   Restless leg syndrome 11/14/2012   Sleep apnea 10/03/2012   Wheezing 04/12/2011    Past Medical History:  Diagnosis Date   Allergy     Anemia    Asthma    CAD (coronary artery disease)    Moderate, nonobstructive CAD as seen on coronary CTA in 2020 with normal FFR.   Cataracts, bilateral    Chronic kidney disease    cyst in left kidney   Class 2 obesity    Colon cancer [facility]) 2017   surgical tx  removal of polyp   Concussion 04/20/2021   Coronary  artery calcification    a. POET in 2015 with no acute findings.   Diarrhea since 02-16-17   c dif test normal per pt   Dyspnea     with exertion, occ at rest no oxygen or inhaler use   GERD (gastroesophageal reflux disease)    H/O: rheumatic fever as child   Headache    occ left side migraine   Heart murmur    Hyperlipidemia    Hypertension    Leg edema    Lung disease    Lung nodule   Neuromuscular disorder (HCC)    Non-alcoholic cirrhosis (HCC) 2013   no current issues with   Pneumonia 2016   Rheumatoid arthritis (HCC)    oa also, back arthritis, hx rheumatoid     Family History  Problem Relation Age of Onset   CAD Mother 66  CABG/stents   Breast cancer Mother    Kidney failure Mother    Arthritis Mother    Cancer Mother    Heart disease Mother    Hypertension Mother    Kidney disease Mother    Asthma Father    CAD Father 28   Allergies Father    Heart disease Father    Hypertension Father    CAD Brother    Heart disease Brother    CAD Brother 23       9 stents   CAD Brother 21       7 stents   Heart attack Brother    Past Surgical History:  Procedure Laterality Date   ABDOMINAL HYSTERECTOMY     ovaries left   ANKLE SURGERY Left 02/12/2021   Arthritis Surgery Right 04/20/2023   COLONOSCOPY WITH PROPOFOL  N/A 03/23/2017   Procedure: COLONOSCOPY WITH PROPOFOL ;  Surgeon: Burnette Fallow, MD;  Location: [facility];  Service: Endoscopy;  Laterality: N/A;   colonscopy  2017   DILATION AND CURETTAGE OF UTERUS     few done for endometriosis   GASTROC RECESSION EXTREMITY Left 02/12/2021   Procedure: Left gastroc recession;  Surgeon: Kit Rush, MD;  Location: [facility];  Service: Orthopedics;  Laterality: Left;   ROTATOR CUFF REPAIR Left 09/2021   athroscopic and open   WRIST SURGERY     Social History   Social History Narrative   Lives alone.     Are you right handed or left handed? Right   Are you currently employed ? retire       Do you live at home alone?with sister in law      What type of home do you live in: 1 story or 2 story? one       Immunization History  Administered Date(s) Administered   INFLUENZA, HIGH DOSE SEASONAL PF 08/05/2022   Influenza,inj,Quad PF,6+ Mos 05/20/2021   Moderna Sars-Covid-2 Vaccination 10/02/2019, 10/30/2019, 05/05/2020, 10/13/2020     Objective: Vital Signs: BP (!) 114/58   Pulse 83   Temp 97.8 F (36.6 C)   Resp 16   Ht 4' 11 (1.499 m)   Wt 187 lb (84.8 kg)   BMI 37.77 kg/m    Physical Exam Constitutional:      Appearance: She is obese.  Eyes:     Conjunctiva/sclera: Conjunctivae normal.  Cardiovascular:     Rate and Rhythm: Normal rate and regular rhythm.  Pulmonary:     Effort: Pulmonary effort is normal.     Breath sounds: Normal breath sounds.  Lymphadenopathy:     Cervical: No cervical adenopathy.  Skin:    General: Skin is warm and dry.     Comments: Left 2+ pitting right 1+ pitting pedal edema  Neurological:     Mental Status: She is alert.  Psychiatric:        Mood and Affect: Mood normal.      Musculoskeletal Exam:  Elbows full ROM no tenderness or swelling Wrists full ROM no tenderness or swelling Fingers prominent heberdon's nodes of DIPs b/l with some lateral deviations and flexion, wearing brace on left thumb, postsurgical changes right 1st CMC Left knee with anterior bony nodules present, decreased flexion ROM tolerated, no palpable effusion Ankles full ROM no tenderness, overlying swelling without any definite joint effusion  Investigation: No additional findings.  Imaging: MR ABDOMEN WWO CONTRAST Result Date: 03/16/2024 CLINICAL DATA:  Complex left renal cyst EXAM: MRI ABDOMEN WITHOUT AND WITH CONTRAST TECHNIQUE: Multiplanar  multisequence MR imaging of the abdomen was performed both before and after the administration of intravenous contrast. CONTRAST:  8 mL Vueway  gadolinium contrast IV COMPARISON:  CT enterography, 11/01/2023, MR  abdomen, 04/10/2021 FINDINGS: Lower chest: No acute abnormality. Hepatobiliary: No solid liver abnormality is seen. Marked hepatic steatosis. No gallstones, gallbladder wall thickening, or biliary dilatation. Pancreas: Unremarkable. No pancreatic ductal dilatation or surrounding inflammatory changes. Spleen: Normal in size without significant abnormality. Adrenals/Urinary Tract: Adrenal glands are unremarkable. Complex cystic lesion of the peripheral midportion of the left kidney measuring 2.0 x 1.3 x 1.3 cm, with thick, enhancing internal septations (series 4, image 16, series 3, image 25, series 11, image 66). This is increased in size and complexity when compared to examination dated 04/10/2021. Right kidney is normal, without renal calculi, solid lesion, or hydronephrosis. Stomach/Bowel: Stomach is within normal limits. No evidence of bowel wall thickening, distention, or inflammatory changes. Vascular/Lymphatic: No significant vascular findings are present. No enlarged abdominal lymph nodes. Other: No abdominal wall hernia or abnormality. No ascites. Musculoskeletal: No acute or significant osseous findings. IMPRESSION: 1. Complex cystic lesion of the peripheral midportion of the left kidney measuring 2.0 x 1.3 x 1.3 cm, with thick, enhancing internal septations. This is increased in size and complexity when compared to examination dated 04/10/2021. 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 2. No evidence of lymphadenopathy or metastatic disease in the abdomen. 3. Hepatic steatosis. Electronically Signed   By: Marolyn JONETTA Jaksch M.D.   On: 03/16/2024 14:58   ECHOCARDIOGRAM COMPLETE Result Date: 03/16/2024    ECHOCARDIOGRAM REPORT   Patient Name:   Raniah Mclane Date of Exam: 03/16/2024 Medical Rec #:  [id number]         Height:       59.0 in Accession #:    [id number]        Weight:       190.2 lb Date of Birth:  [birth year]         BSA:           1.805 m Patient Age:    68 years          BP:           124/72 mmHg Patient Gender: F                 HR:           70 bpm. Exam Location:  [address] Procedure: 2D Echo, Cardiac Doppler and Color Doppler (Both Spectral and Color            Flow Doppler were utilized during procedure). Indications:    R06.00 Dyspnea                 I51.7 Cardiomegaly  History:        Patient has prior history of Echocardiogram examinations, most                 recent 05/12/2022. CAD, Signs/Symptoms:Dyspnea and Edema; Risk                 Factors:Hypertension and Diabetes.  Sonographer:    Carl Coma RDCS Referring Phys: [id number] ANGELA NICOLE DUKE IMPRESSIONS  1. Left ventricular ejection fraction, by estimation, is 55 to 60%. The left ventricle has normal function. The left ventricle has no regional wall motion abnormalities. Left ventricular diastolic parameters were normal.  2. Right ventricular systolic function is normal. The right ventricular size is  normal.  3. The mitral valve is normal in structure. No evidence of mitral valve regurgitation. No evidence of mitral stenosis.  4. The aortic valve is tricuspid. Aortic valve regurgitation is not visualized. No aortic stenosis is present. Comparison(s): No significant change from prior study. Prior images reviewed side by side. FINDINGS  Left Ventricle: Left ventricular ejection fraction, by estimation, is 55 to 60%. The left ventricle has normal function. The left ventricle has no regional wall motion abnormalities. The left ventricular internal cavity size was normal in size. There is  no left ventricular hypertrophy. Left ventricular diastolic parameters were normal. Normal left ventricular filling pressure. Right Ventricle: The right ventricular size is normal. No increase in right ventricular wall thickness. Right ventricular systolic function is normal. Left Atrium: Left atrial size was normal in size. Right Atrium: Right atrial size was normal in size.  Pericardium: There is no evidence of pericardial effusion. Mitral Valve: The mitral valve is normal in structure. No evidence of mitral valve regurgitation. No evidence of mitral valve stenosis. Tricuspid Valve: The tricuspid valve is normal in structure. Tricuspid valve regurgitation is trivial. Aortic Valve: The aortic valve is tricuspid. Aortic valve regurgitation is not visualized. No aortic stenosis is present. Pulmonic Valve: The pulmonic valve was not well visualized. Pulmonic valve regurgitation is not visualized. No evidence of pulmonic stenosis. Aorta: The aortic root and ascending aorta are structurally normal, with no evidence of dilitation. IAS/Shunts: No atrial level shunt detected by color flow Doppler.  LEFT VENTRICLE PLAX 2D LVIDd:         3.40 cm   Diastology LVIDs:         2.40 cm   LV e' medial:    8.81 cm/s LV PW:         0.70 cm   LV E/e' medial:  10.0 LV IVS:        0.70 cm   LV e' lateral:   8.70 cm/s LVOT diam:     1.80 cm   LV E/e' lateral: 10.1 LV SV:         56 LV SV Index:   31 LVOT Area:     2.54 cm  RIGHT VENTRICLE             IVC RV Basal diam:  2.80 cm     IVC diam: 1.30 cm RV S prime:     11.13 cm/s TAPSE (M-mode): 3.5 cm LEFT ATRIUM             Index        RIGHT ATRIUM          Index LA diam:        3.40 cm 1.88 cm/m   RA Area:     8.82 cm LA Vol (A2C):   33.1 ml 18.33 ml/m  RA Volume:   18.10 ml 10.03 ml/m LA Vol (A4C):   39.7 ml 21.99 ml/m LA Biplane Vol: 36.1 ml 20.00 ml/m  AORTIC VALVE LVOT Vmax:   97.95 cm/s LVOT Vmean:  67.900 cm/s LVOT VTI:    0.219 m  AORTA Ao Root diam: 2.90 cm Ao Asc diam:  2.80 cm MITRAL VALVE MV Area (PHT): 4.35 cm     SHUNTS MV Decel Time: 175 msec     Systemic VTI:  0.22 m MV E velocity: 88.05 cm/s   Systemic Diam: 1.80 cm MV A velocity: 101.30 cm/s MV E/A ratio:  0.87 Mihai Croitoru MD Electronically signed by Jerel Balding MD Signature Date/Time: 03/16/2024/1:27:07  PM    Final     Recent Labs: Lab Results  Component Value Date   WBC  8.5 03/14/2024   HGB 10.6 (L) 03/14/2024   PLT 224 03/14/2024   NA 143 04/12/2023   K 3.6 04/12/2023   CL 104 04/12/2023   CO2 29 04/12/2023   GLUCOSE 102 (H) 04/12/2023   BUN 28 (H) 04/12/2023   CREATININE 1.06 (H) 04/12/2023   BILITOT 0.6 04/12/2023   ALKPHOS 129 (H) 07/12/2022   AST 22 04/12/2023   ALT 21 04/12/2023   PROT 7.5 04/12/2023   ALBUMIN 3.6 (L) 07/12/2022   CALCIUM  9.6 04/12/2023   GFRAA 99 08/18/2020    Speciality Comments: No specialty comments available.  Procedures:  No procedures performed Allergies: Amlodipine , Bee venom, Lisinopril , Other, Aspirin, Cefdinir, Erythromycin, Hctz [hydrochlorothiazide ], and Penicillins   Assessment / Plan:     Visit Diagnoses: Erosive osteoarthritis of both hands - Plan: methotrexate (RHEUMATREX) 2.5 MG tablet, folic acid (FOLVITE) 1 MG tablet, Sedimentation rate Chronic erosive osteoarthritis with persistent inflammatory joint pain, particularly in the hands. Previous NSAIDs ineffective/risky due to asthma and edema. Methotrexate considered for inflammation reduction without asthma exacerbation. Discussed EOH limited data for effective options but there is some suggested for methotrexate, and would have neutral to positive effect for atopic disease. - Prescribed methotrexate 10 mg (four 2.5 mg tablets) once weekly. - Checking sed rate for disease activity baseline - Supplement folic acid 1 mg daily - Follow up in one month to assess symptom improvement and review blood test results.  High risk medication use - Plan: Hepatitis B core antibody, IgM, Hepatitis B surface antigen, Hepatitis C antibody, Hepatic function panel Discussed risks of methotrexate including cytopenias, hepatotoxicity, infections, GI intolerance, and need for regular lab monitoring. She has a suspicious renal cyst, methotrexate is not a highly efficacious treatment for RCC but also not contraindicated. Hepatic steatosis on imaging but LFTs have been normal. -  checking LFTs and HBV/HCv screening for methotrexate start  Anemia of chronic disease Mild anemia likely due to chronic disease with low iron levels suggesting possible iron deficiency. Fatigue and bruising may be related to anemia.  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 Lasix  improved swelling. Normal BNP levels indicate no cardiac fluid volume excess. No protein loss in urine and normal albumin levels suggest swelling not due to protein loss.        Orders: Orders Placed This Encounter  Procedures   Hepatitis B core antibody, IgM   Hepatitis B surface antigen   Hepatitis C antibody   Sedimentation rate   Hepatic function panel   Meds ordered this encounter  Medications   methotrexate (RHEUMATREX) 2.5 MG tablet    Sig: Take 4 tablets (10 mg total) by mouth once a week. Caution:Chemotherapy. Protect from light.    Dispense:  20 tablet    Refill:  0   folic acid (FOLVITE) 1 MG tablet    Sig: Take 1 tablet (1 mg total) by mouth daily.    Dispense:  90 tablet    Refill:  0     Follow-Up Instructions: Return for EOH MTX start f/u 4-5wks.   Lonni LELON Ester, MD  Note - This record has been created using [employer].  Chart creation errors have been sought, but may not always  have been located. Such creation errors do not reflect on  the standard of medical care.

## 2024-04-10 NOTE — Progress Notes (Deleted)
 Cardiology Office Note:    Date:  04/10/2024   ID:  Kendra Gilbert, DOB 03-02-1956, MRN [id number]  PCP:  Kendra Pfeiffer, PA-C   [facility] Providers Cardiologist:  Kendra Schilling, MD { Click to update primary MD,subspecialty MD or APP then REFRESH:1}    Referring MD: Kendra Pfeiffer, PA-C   No chief complaint on file. ***  History of Present Illness:    Kendra Gilbert is a 67 y.o. female with a hx of ***  Past Medical History:  Diagnosis Date   Asthma    CAD (coronary artery disease)    Moderate, nonobstructive CAD as seen on coronary CTA in 2020 with normal FFR.   Cataracts, bilateral    Chronic kidney disease    cyst in left kidney   Class 2 obesity    Colon cancer [facility]) 2017   surgical tx  removal of polyp   Concussion 04/20/2021   Coronary artery calcification    a. POET in 2015 with no acute findings.   Diarrhea since 02-16-17   c dif test normal per pt   Dyspnea     with exertion, occ at rest no oxygen or inhaler use   GERD (gastroesophageal reflux disease)    H/O: rheumatic fever as child   Headache    occ left side migraine   Hyperlipidemia    Hypertension    Leg edema    Lung disease    Lung nodule   Non-alcoholic cirrhosis (HCC) 2013   no current issues with   Pneumonia 2016   Rheumatoid arthritis (HCC)    oa also, back arthritis, hx rheumatoid     Past Surgical History:  Procedure Laterality Date   ABDOMINAL HYSTERECTOMY     ovaries left   ANKLE SURGERY Left 02/12/2021   Arthritis Surgery Right 04/20/2023   COLONOSCOPY WITH PROPOFOL  N/A 03/23/2017   Procedure: COLONOSCOPY WITH PROPOFOL ;  Surgeon: Kendra Fallow, MD;  Location: [facility];  Service: Endoscopy;  Laterality: N/A;   colonscopy  2017   DILATION AND CURETTAGE OF UTERUS     few done for endometriosis   GASTROC RECESSION EXTREMITY Left 02/12/2021   Procedure: Left gastroc recession;  Surgeon: Kendra Rush, MD;  Location: [facility];  Service:  Orthopedics;  Laterality: Left;   ROTATOR CUFF REPAIR Left 09/2021   athroscopic and open    Current Medications: No outpatient medications have been marked as taking for the 04/11/24 encounter (Appointment) with Kendra Kendra Garre, PA.     Allergies:   Amlodipine , Bee venom, Lisinopril , Other, Aspirin, Cefdinir, Erythromycin, Hctz [hydrochlorothiazide ], and Penicillins   Social History   Socioeconomic History   Marital status: Single    Spouse name: Not on file   Number of children: Not on file   Years of education: Not on file   Highest education level: Not on file  Occupational History   Not on file  Tobacco Use   Smoking status: Never    Passive exposure: Never   Smokeless tobacco: Never  Vaping Use   Vaping status: Never Used  Substance and Sexual Activity   Alcohol use: Never   Drug use: Never   Sexual activity: Not Currently    Birth control/protection: Surgical  Other Topics Concern   Not on file  Social History Narrative   Lives alone.     Are you right handed or left handed? Right   Are you currently employed ? retire      Do you live at home  alone?with sister in law      What type of home do you live in: 1 story or 2 story? one       Social Drivers of Corporate investment banker Strain: Not on file  Food Insecurity: Not on file  Transportation Needs: Not on file  Physical Activity: Not on file  Stress: Not on file  Social Connections: Unknown (11/15/2021)   Received from [employer]   Social Network    Social Network: Not on file     Family History: The patient's ***family history includes Allergies in her father; Asthma in her father; Breast cancer in her mother; CAD in her brother; CAD (age of onset: 60) in her brother, father, and mother; CAD (age of onset: 57) in her brother; Heart attack in her brother; Kidney failure in her mother.  ROS:   Please see the history of present illness.    *** All other systems reviewed and are  negative.  EKGs/Labs/Other Studies Reviewed:    The following studies were reviewed today: ***      Recent Labs: 04/12/2023: ALT 21; BUN 28; Creat 1.06; Potassium 3.6; Sodium 143 03/14/2024: BNP 71.2; Hemoglobin 10.6; Platelets 224  Recent Lipid Panel    Component Value Date/Time   CHOL 136 07/12/2022 0818   TRIG 95 07/12/2022 0818   HDL 55 07/12/2022 0818   CHOLHDL 2.5 07/12/2022 0818   CHOLHDL 3.3 02/06/2016 0930   VLDL 19 02/06/2016 0930   LDLCALC 63 07/12/2022 0818   LDLDIRECT 135.2 08/23/2013 1142     Risk Assessment/Calculations:   {Does this patient have ATRIAL FIBRILLATION?:[phone]}  No BP recorded.  {Refresh Note OR Click here to enter BP  :1}***         Physical Exam:    VS:  There were no vitals taken for this visit.    Wt Readings from Last 3 Encounters:  03/20/24 190 lb (86.2 kg)  03/14/24 190 lb 3.2 oz (86.3 kg)  02/23/24 189 lb 12.8 oz (86.1 kg)     GEN: *** Well nourished, well developed in no acute distress HEENT: Normal NECK: No JVD; No carotid bruits LYMPHATICS: No lymphadenopathy CARDIAC: ***RRR, no murmurs, rubs, gallops RESPIRATORY:  Clear to auscultation without rales, wheezing or rhonchi  ABDOMEN: Soft, non-tender, non-distended MUSCULOSKELETAL:  No edema; No deformity  SKIN: Warm and dry NEUROLOGIC:  Alert and oriented x 3 PSYCHIATRIC:  Normal affect   ASSESSMENT:    No diagnosis found. PLAN:    In order of problems listed above:  ***      {Are you ordering a CV Procedure (e.g. stress test, cath, DCCV, TEE, etc)?   Press F2        :[id number]}    Medication Adjustments/Labs and Tests Ordered: Current medicines are reviewed at length with the patient today.  Concerns regarding medicines are outlined above.  No orders of the defined types were placed in this encounter.  No orders of the defined types were placed in this encounter.   There are no Patient Instructions on file for this visit.   Signed, Kendra Gilbert  Kendra Clayson, PA  04/10/2024 1:20 PM    [facility]

## 2024-04-11 ENCOUNTER — Ambulatory Visit: Admitting: Physician Assistant

## 2024-04-11 ENCOUNTER — Ambulatory Visit: Admitting: Allergy

## 2024-04-11 ENCOUNTER — Encounter: Payer: Self-pay | Admitting: Internal Medicine

## 2024-04-11 ENCOUNTER — Ambulatory Visit: Payer: Medicare Other | Attending: Internal Medicine | Admitting: Internal Medicine

## 2024-04-11 VITALS — BP 114/58 | HR 83 | Temp 97.8°F | Resp 16 | Ht 59.0 in | Wt 187.0 lb

## 2024-04-11 DIAGNOSIS — I872 Venous insufficiency (chronic) (peripheral): Secondary | ICD-10-CM | POA: Insufficient documentation

## 2024-04-11 DIAGNOSIS — Z79899 Other long term (current) drug therapy: Secondary | ICD-10-CM | POA: Diagnosis present

## 2024-04-11 DIAGNOSIS — M154 Erosive (osteo)arthritis: Secondary | ICD-10-CM | POA: Insufficient documentation

## 2024-04-11 MED ORDER — FOLIC ACID 1 MG PO TABS: 1.0000 mg | ORAL_TABLET | Freq: Every day | ORAL | 0 refills | Status: AC

## 2024-04-11 MED ORDER — METHOTREXATE SODIUM 2.5 MG PO TABS: 10.0000 mg | ORAL_TABLET | ORAL | 0 refills | Status: DC

## 2024-04-11 NOTE — Patient Instructions (Signed)
 Methotrexate  Tablets What is this medication? METHOTREXATE  (METH oh TREX ate) treats autoimmune conditions, such as arthritis and psoriasis. It works by decreasing inflammation, which can reduce pain and prevent long-term injury to the joints and skin. It may also be used to treat some types of cancer. It works by slowing down the growth of cancer cells. This medicine may be used for other purposes; ask your health care provider or pharmacist if you have questions. COMMON BRAND NAME(S): Rheumatrex, Trexall  What should I tell my care team before I take this medication? They need to know if you have any of these conditions: Dehydration Diabetes Fluid in the stomach area or lungs Frequently drink alcohol Having surgery, including dental surgery High cholesterol Immune system problems Inflammatory bowel disease, such as ulcerative colitis Kidney disease Liver disease Low blood cell levels (white cells, red cells, and platelets) Lung disease Recent or ongoing radiation Recent or upcoming vaccine Stomach ulcers, other stomach or intestine problems An unusual or allergic reaction to methotrexate , other medications, foods, dyes, or preservatives Pregnant or trying to get pregnant Breastfeeding How should I use this medication? Take this medication by mouth with water. Take it as directed on the prescription label. Do not take extra. Keep taking this medication until your care team tells you to stop. Know why you are taking this medication and how you should take it. To treat conditions such as arthritis and psoriasis, this medication is taken ONCE A WEEK as a single dose or divided into 3 smaller doses taken 12 hours apart (do not take more than 3 doses 12 hours apart each week). This medication is NEVER taken daily to treat conditions other than cancer. Taking this medication more often than directed can cause serious side effects, even death. Talk to your care team about why you are taking this  medication, how often you will take it, and what your dose is. Ask your care team to put the reason you take this medication on the prescription. If you take this medication ONCE A WEEK, choose a day of the week before you start. Ask your pharmacist to include the day of the week on the label. Avoid Monday, which could be misread as Morning. Handling this medication may be harmful. Talk to your care team about how to handle this medication. Special instructions may apply. Talk to your care team about the use of this medication in children. While it may be prescribed for selected conditions, precautions do apply. Overdosage: If you think you have taken too much of this medicine contact a poison control center or emergency room at once. NOTE: This medicine is only for you. Do not share this medicine with others. What if I miss a dose? If you miss a dose, talk with your care team. Do not take double or extra doses. What may interact with this medication? Do not take this medication with any of the following: Acitretin Live virus vaccines Probenecid This medication may also interact with the following: Alcohol Aspirin and aspirin-like medications Certain antibiotics, such as penicillin, neomycin, sulfamethoxazole; trimethoprim Certain medications for stomach problems, such as lansoprazole, omeprazole, pantoprazole Clozapine Cyclosporine Dapsone Folic acid  Foscarnet NSAIDs, medications for pain and inflammation, such as ibuprofen or naproxen Phenytoin Pyrimethamine Steroid medications, such as prednisone  or cortisone Tacrolimus Theophylline This list may not describe all possible interactions. Give your health care provider a list of all the medicines, herbs, non-prescription drugs, or dietary supplements you use. Also tell them if you smoke, drink alcohol, or use  illegal drugs. Some items may interact with your medicine. What should I watch for while using this medication? Visit your  care team for regular checks on your progress. It may be some time before you see the benefit from this medication. You may need blood work done while you are taking this medication. If your care team has also prescribed folic acid , they may instruct you to skip your folic acid  dose on the day you take methotrexate . This medication can make you more sensitive to the sun. Keep out of the sun. If you cannot avoid being in the sun, wear protective clothing and sunscreen. Do not use sun lamps, tanning beds, or tanning booths. Check with your care team if you have severe diarrhea, nausea, and vomiting, or if you sweat a lot. The loss of too much body fluid may make it dangerous for you to take this medication. This medication may increase your risk of getting an infection. Call your care team for advice if you get a fever, chills, sore throat, or other symptoms of a cold or flu. Do not treat yourself. Try to avoid being around people who are sick. Talk to your care team about your risk of cancer. You may be more at risk for certain types of cancers if you take this medication. Talk to your care team if you or your partner may be pregnant. Serious birth defects can occur if you take this medication during pregnancy and for 6 months after the last dose. You will need a negative pregnancy test before starting this medication. Contraception is recommended while taking this medication and for 6 months after the last dose. Your care team can help you find the option that works for you. If your partner can get pregnant, use a condom during sex while taking this medication and for 3 months after the last dose. Do not breastfeed while taking this medication and for 1 week after the last dose. This medication may cause infertility. Talk to your care team if you are concerned about your fertility. What side effects may I notice from receiving this medication? Side effects that you should report to your care team as soon  as possible: Allergic reactions--skin rash, itching, hives, swelling of the face, lips, tongue, or throat Dry cough, shortness of breath or trouble breathing Infection--fever, chills, cough, sore throat, wounds that don't heal, pain or trouble when passing urine, general feeling of discomfort or being unwell Kidney injury--decrease in the amount of urine, swelling of the ankles, hands, or feet Liver injury--right upper belly pain, loss of appetite, nausea, light-colored stool, dark yellow or brown urine, yellowing skin or eyes, unusual weakness or fatigue Low red blood cell level--unusual weakness or fatigue, dizziness, headache, trouble breathing Pain, tingling, or numbness in the hands or feet, muscle weakness, change in vision, confusion or trouble speaking, loss of balance or coordination, trouble walking, seizures Redness, blistering, peeling, or loosening of the skin, including inside the mouth Stomach bleeding--bloody or black, tar-like stools, vomiting blood or brown material that looks like coffee grounds Stomach pain that is severe, does not go away, or gets worse Unusual bruising or bleeding Side effects that usually do not require medical attention (report these to your care team if they continue or are bothersome): Diarrhea Dizziness Hair loss Nausea Pain, redness, or swelling with sores inside the mouth or throat Skin reactions on sun-exposed areas Vomiting This list may not describe all possible side effects. Call your doctor for medical advice about side effects.  You may report side effects to FDA at 1-800-FDA-1088. Where should I keep my medication? Keep out of the reach of children and pets. Store at room temperature between 20 and 25 degrees C (68 and 77 degrees F). Protect from light. Keep the container tightly closed. Get rid of any unused medication after the expiration date. To get rid of medications that are no longer needed or have expired: Take the medication to a  medication take-back program. Check with your pharmacy or law enforcement to find a location. If you cannot return the medication, ask your pharmacist or care team how to get rid of this medication safely. NOTE: This sheet is a summary. It may not cover all possible information. If you have questions about this medicine, talk to your doctor, pharmacist, or health care provider.  2024 Elsevier/Gold Standard (2023-06-03 00:00:00)

## 2024-04-12 LAB — HEPATITIS B CORE ANTIBODY, IGM: Hep B C IgM: NONREACTIVE

## 2024-04-12 LAB — HEPATIC FUNCTION PANEL
AG Ratio: 1.1 (calc) (ref 1.0–2.5)
ALT: 17 U/L (ref 6–29)
AST: 19 U/L (ref 10–35)
Albumin: 3.9 g/dL (ref 3.6–5.1)
Alkaline phosphatase (APISO): 118 U/L (ref 37–153)
Bilirubin, Direct: 0.1 mg/dL (ref 0.0–0.2)
Globulin: 3.5 g/dL (ref 1.9–3.7)
Indirect Bilirubin: 0.5 mg/dL (ref 0.2–1.2)
Total Bilirubin: 0.6 mg/dL (ref 0.2–1.2)
Total Protein: 7.4 g/dL (ref 6.1–8.1)

## 2024-04-12 LAB — HEPATITIS B SURFACE ANTIGEN: Hepatitis B Surface Ag: NONREACTIVE

## 2024-04-12 LAB — HEPATITIS C ANTIBODY: Hepatitis C Ab: NONREACTIVE

## 2024-04-12 LAB — SEDIMENTATION RATE: Sed Rate: 113 mm/h — ABNORMAL HIGH (ref 0–30)

## 2024-04-12 NOTE — Progress Notes (Signed)
 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  Due to her GI comorbidities, she does not want to try increasing dose at this time. Advised to follow up with dentist for evaluation of left TMJ dysfunction Follow up 6 months.       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    UPDATE: Current medications:  Lyrica  50mg  twice daily  Her asthma has been flaring up over the past summer causing difficulty breathing.  Also found to be anemic, so she has been fatigued.  Also dealing with her arthritis.  Difficult to assess efficacy of Lyrica  for headache and neuropathy as she has diffuse pain.    Headaches: Due to all of her health issues, she has not had a chance to follow up with her dentist regarding possible TMJ dysfunction.  Also has neck pain due to her arthritis.  Has tried PT in the past.  Does home neck stretches. Last MRI of cervical spine on 10/05/2017 revealed mild cervical spondylosis without stenosis.    Neuralgia/pain: Ongoing.    HISTORY: Posttraumatic headache: In October 2022, she fell and hit her forehead and face on concrete after tripping outside.  She sustained forehead hematoma..  She was seen in the ED where CT head  and maxillofacial personally reviewed were negative for acute fractures or intracranial abnormalities.  Since then, she started having headaches.  They are left sided (rarely right sided) pressure in the eye and left side of head, a dull pressure that gradually becomes severe later in the day.  Associated photosensitivity.  No nausea, vomiting, phonophobia, visual disturbance, or unilateral numbness or weakness.  It is persistent.  Has some neck pain but subsequently underwent left shoulder surgery.  Reading and working on computer aggravates it.  Does not treat with analgesics.  She was treated at [facility] for concussion which helped.  She underwent vestibular rehab.  She was experiencing rhinorrhea and has a history of chronic sinusitis, so it was suspected that her ongoing headaches were related to allergies and sinusitis.  She saw ENT.  CT sinuses on 08/27/2021 were normal.  Eye exams have been unremarkable except for dry eye for which she received drops.     Dysesthesias:  possible small fiber neuropathy: Also, beginning in 2022, she notes burning in her feet and hands.  It is also persistent.  Involves the left foot and calf and lateral right foot.  Involves the fingers in the hand.  Sometimes her fingers or toes will curl and get stuck.  She had rheumatoid arthritis since childhood but was told by rheumatology that it changed to osteoarthritis a couple of years ago.  No longer on Plaquenil.  Autoimmune labs from June 2022 include sed rate 89, negative RF, negative CCP antibody, ds-DNA antibody 4, negative RNP antibody.   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  NCV-EMG on 06/08/2022 revealed mild left ulnar neuropathy with slowing across the elbow and chronic mild bilateral L4 radiculopathy but no large fiber sensorimotor polyneuropathy.  She she does endorse pain and numbness radiating  up the arm along the ulnar distribution.  Elbow is tender to touch and when leaning just a little bit.  Always tries to keep arm straight, which is difficult at times.  She has neck and back pain.  Sometimes she feels pain down the legs as well.  History of left ankle surgery and ulnar nerve transposition and left rotator cuff surgery.  Followed by the hand specialist who had performed surgery on her right hand.  Imaging: MRI of brain with and without contrast on 05/06/2023 revealed 5 mm meningioma overlying the posteromedial aspect of the right cerebellar hemisphere as well as mild chronic small vessel ischemic changes without the cerebral white matter but otherwise unremarkable.    Past medications:   Previously took gabapentin , which upset her stomach.  Did not start nortriptyline  because her pharmacist was concerned about side effects and interactions with her other medications.  Cannot take NSAIDs due to GERD. Other past treatment:   PT neck and vestibular rehab (ineffective)  PAST MEDICAL HISTORY: Past Medical History:  Diagnosis Date   Allergy     Anemia    Asthma    CAD (coronary artery disease)    Moderate, nonobstructive CAD as seen on coronary CTA in 2020 with normal FFR.   Cataracts, bilateral    Chronic kidney disease    cyst in left kidney   Class 2 obesity    Colon cancer [facility]) 2017   surgical tx  removal of polyp   Concussion 04/20/2021   Coronary artery calcification    a. POET in 2015 with no acute findings.   Diarrhea since 02-16-17   c dif test normal per pt   Dyspnea     with exertion, occ at rest no oxygen or inhaler use   GERD (gastroesophageal reflux disease)    H/O: rheumatic fever as child   Headache    occ left side migraine   Heart murmur    Hyperlipidemia    Hypertension    Leg edema    Lung disease    Lung nodule   Neuromuscular disorder (HCC)    Non-alcoholic cirrhosis (HCC) 2013   no current issues with   Pneumonia 2016   Rheumatoid arthritis  (HCC)    oa also, back arthritis, hx rheumatoid     MEDICATIONS: Current Outpatient Medications on File Prior to Visit  Medication Sig Dispense Refill   acetaminophen  (TYLENOL ) 500 MG tablet Take 325 mg by mouth daily as needed for mild pain, fever or headache.     ARNUITY ELLIPTA  200 MCG/ACT AEPB Inhale 1 puff into the lungs daily. 30 each 5   azelastine  (OPTIVAR ) 0.05 % ophthalmic solution Place 1 drop into both eyes 2 (two) times daily as needed (Itchy, watery eyes). 6 mL 5   BREZTRI  AEROSPHERE 160-9-4.8 MCG/ACT AERO inhaler INHALE 2 PUFFS INTO THE LUNGS TWICE DAILY TO PREVENT COUGH OR WHEEZE AND RINSE MOUTH AFTERWARDS 10.7 g 4   Carbinoxamine  Maleate 4 MG TABS 1 tab 1-2 times a day 56 tablet 5   diphenhydrAMINE (BENADRYL) 25 MG tablet Take 25 mg by mouth every 6 (six) hours as needed.     EPINEPHrine  0.3 mg/0.3 mL IJ SOAJ injection Inject  0.3 mg into the muscle as needed for anaphylaxis. 2 each 1   folic acid (FOLVITE) 1 MG tablet Take 1 tablet (1 mg total) by mouth daily. 90 tablet 0   furosemide  (LASIX ) 40 MG tablet TAKE 1 TABLET BY MOUTH DAILY AS NEEDED FOR WEIGHT GAIN OF 3LBS OVERNIGHT OR 5LBS IN 1 WEEK 90 tablet 3   ipratropium (ATROVENT ) 0.06 % nasal spray USE 2 SPRAYS IN EACH NOSTRIL EVERY 4 HOURS AS NEEDED FOR RHINITIS     ipratropium-albuterol  (DUONEB) 0.5-2.5 (3) MG/3ML SOLN Use 1 vial via nebulizer every 4-6 hours as needed for cough, wheeze, shortness of breath or chest tightness 180 mL 1   isosorbide  mononitrate (IMDUR ) 30 MG 24 hr tablet Take 1 tablet (30 mg total) by mouth daily. 90 tablet 3   levocetirizine (XYZAL ) 5 MG tablet TAKE 1 TABLET(5 MG) BY MOUTH EVERY EVENING 90 tablet 0   losartan  (COZAAR ) 25 MG tablet TAKE 1 TABLET(25 MG) BY MOUTH IN THE MORNING AND AT BEDTIME 180 tablet 3   methotrexate (RHEUMATREX) 2.5 MG tablet Take 4 tablets (10 mg total) by mouth once a week. Caution:Chemotherapy. Protect from light. 20 tablet 0   nitroGLYCERIN  (NITROSTAT ) 0.4 MG SL tablet  Place 1 tablet (0.4 mg total) under the tongue every 5 (five) minutes as needed for chest pain. 25 tablet 3   pantoprazole  (PROTONIX ) 20 MG tablet Take 40 mg by mouth daily. (Patient not taking: Reported on 04/11/2024)     pantoprazole  (PROTONIX ) 40 MG tablet Take 40 mg by mouth 2 (two) times daily.     pravastatin  (PRAVACHOL ) 80 MG tablet      prednisoLONE acetate (PRED FORTE) 1 % ophthalmic suspension      pregabalin  (LYRICA ) 50 MG capsule Take 1 capsule (50 mg total) by mouth 2 (two) times daily. 60 capsule 5   RESTASIS 0.05 % ophthalmic emulsion 1 drop 2 (two) times daily.     rosuvastatin  (CRESTOR ) 20 MG tablet Take 1 tablet (20 mg total) by mouth daily. 90 tablet 3   Sodium Chloride -Sodium Bicarb 1.57 g PACK Place into the nose.     VENTOLIN  HFA 108 (90 Base) MCG/ACT inhaler Inhale 2 puffs into the lungs every 4 (four) hours as needed for wheezing or shortness of breath. 18 g 0   No current facility-administered medications on file prior to visit.    ALLERGIES: Allergies  Allergen Reactions   Amlodipine  Shortness Of Breath and Swelling   Bee Venom Anaphylaxis and Hives   Lisinopril  Itching and Cough    Dry cough and severe itching in the throat    Other Other (See Comments)    Mosquito bites cause hives all over body   Aspirin Other (See Comments)    Causes stomach problems  aluminum aspirin   Cefdinir Other (See Comments)    Other reaction(s): hives  cefdinir   Erythromycin Other (See Comments)    Ate the lining of her stomach   Hctz [Hydrochlorothiazide ]     Rash    Penicillins Rash and Dermatitis    Has patient had a PCN reaction causing immediate rash, facial/tongue/throat swelling, SOB or lightheadedness with hypotension: Yes  Has patient had a PCN reaction causing severe rash involving mucus membranes or skin necrosis: Yes  Has patient had a PCN reaction that required hospitalization: No   Has patient had a PCN reaction occurring within the last 10 years:  No  If all of the above answers are NO, then may proceed with Cephalosporin use.  FAMILY HISTORY: Family History  Problem Relation Age of Onset   CAD Mother 15       CABG/stents   Breast cancer Mother    Kidney failure Mother    Arthritis Mother    Cancer Mother    Heart disease Mother    Hypertension Mother    Kidney disease Mother    Asthma Father    CAD Father 70   Allergies Father    Heart disease Father    Hypertension Father    CAD Brother    Heart disease Brother    CAD Brother 33       9 stents   CAD Brother 92       7 stents   Heart attack Brother       Objective:  Blood pressure (!) 118/58, pulse 84, height 4' 11 (1.499 m), weight 187 lb (84.8 kg), SpO2 100%. General: No acute distress.  Patient appears well-groomed.   Full range of motion of neck.  Tenderness to paraspinals bilaterally   Juliene Dunnings, DO  CC: Charmaine Bright, PA-C

## 2024-04-13 NOTE — Progress Notes (Signed)
 Cardiology Office Note:  .   Date:  04/16/2024  ID:  [name], DOB [birth year], MRN [id number] PCP: Katina Pfeiffer, PA-C  [facility] Providers Cardiologist:  Lynwood Schilling, MD    History of Present Illness: .   Kendra Gilbert is a 68 y.o. female   with a hx of hypertension intolerant to lisinopril , nonobstructive CAD noted on coronary CTA 04/2022 that showed a coronary calcium  score of 331 placing her at the 92nd percentile with moderate CAD in the proximal LAD and negative FFR.  This was in the setting of negative Lexiscan  Myoview  obtained February 2022.  Her ongoing chest pain was managed with Imdur .  She also has chronic shortness of breath and follows with pulmonology for asthma.  She has prior lower extremity swelling controlled with Lasix .   Echocardiogram in 2023 showed preserved LVEF 60-65%, mild LVH, grade 1 DD, and no significant valvular disease.  Patient was seen 03/14/24 for LE edema. Echo normal, labs ok except for anemia.  Patient comes in for f/u. Swelling down in am but worsens as the day goes on. Watches salt closely and BP controlled. Walks 1-1 1/2 hrs 3-4 days a week but limited b/c of asthma. Has walk on flat surface. Taking lasix  40 mg daily now. Can't wear compression hose.   ROS:    Studies Reviewed: SABRA         Prior CV Studies:    Echo 03/2024 IMPRESSIONS     1. Left ventricular ejection fraction, by estimation, is 55 to 60%. The  left ventricle has normal function. The left ventricle has no regional  wall motion abnormalities. Left ventricular diastolic parameters were  normal.   2. Right ventricular systolic function is normal. The right ventricular  size is normal.   3. The mitral valve is normal in structure. No evidence of mitral valve  regurgitation. No evidence of mitral stenosis.   4. The aortic valve is tricuspid. Aortic valve regurgitation is not  visualized. No aortic stenosis is present.   Comparison(s): No significant  change from prior study. Prior images  reviewed side by side.    Risk Assessment/Calculations:             Physical Exam:   VS:  BP 124/75   Pulse 80   Ht 4' 11 (1.499 m)   Wt 187 lb 6.4 oz (85 kg)   SpO2 97%   BMI 37.85 kg/m    Orhtostatics: No data found. Wt Readings from Last 3 Encounters:  04/16/24 187 lb 6.4 oz (85 kg)  04/11/24 187 lb (84.8 kg)  03/20/24 190 lb (86.2 kg)    GEN: Obese, in no acute distress NECK: No JVD; No carotid bruits CARDIAC:  RRR, no murmurs, rubs, gallops RESPIRATORY:  wheezing throughout ABDOMEN: Soft, non-tender, non-distended EXTREMITIES:  plus 1 edema; No deformity   ASSESSMENT AND PLAN: .    LE edema/Dyspnea - has been taking 40 mg Lasix  daily - increased from 3 days per week to nearly daily since the summer,  - repeat echo-normal LVEF - CMP and BNP-normal - continue lasix  for now     Hypertension - continue 25 mg losartan , 30 mg imdur      Nonobstructive CAD - CTA coronary with FFR negative disease in the LAD in 05/2022 - risk factor modification - continue 20 mg crestor   HLD on crestor -due for FLP  Obesity-eats mostly vegetarian, limited sugars. Discussed healthy weight loss center but patient wants to hold off as she's see multiple doctors  and recently started on methotrexate.     Asthma - following with pulmonology          Dispo: f/u Dr. Lavona 6 months.  Signed, Olivia Pavy, PA-C

## 2024-04-16 ENCOUNTER — Ambulatory Visit (INDEPENDENT_AMBULATORY_CARE_PROVIDER_SITE_OTHER): Admitting: Neurology

## 2024-04-16 ENCOUNTER — Ambulatory Visit: Attending: Physician Assistant | Admitting: Physician Assistant

## 2024-04-16 ENCOUNTER — Encounter: Payer: Self-pay | Admitting: Neurology

## 2024-04-16 VITALS — BP 124/75 | HR 80 | Ht 59.0 in | Wt 187.4 lb

## 2024-04-16 VITALS — BP 118/58 | HR 84 | Ht 59.0 in | Wt 187.0 lb

## 2024-04-16 DIAGNOSIS — J45909 Unspecified asthma, uncomplicated: Secondary | ICD-10-CM | POA: Insufficient documentation

## 2024-04-16 DIAGNOSIS — G44309 Post-traumatic headache, unspecified, not intractable: Secondary | ICD-10-CM

## 2024-04-16 DIAGNOSIS — E66812 Obesity, class 2: Secondary | ICD-10-CM | POA: Insufficient documentation

## 2024-04-16 DIAGNOSIS — R609 Edema, unspecified: Secondary | ICD-10-CM | POA: Diagnosis present

## 2024-04-16 DIAGNOSIS — M542 Cervicalgia: Secondary | ICD-10-CM

## 2024-04-16 DIAGNOSIS — I2510 Atherosclerotic heart disease of native coronary artery without angina pectoris: Secondary | ICD-10-CM | POA: Insufficient documentation

## 2024-04-16 DIAGNOSIS — G629 Polyneuropathy, unspecified: Secondary | ICD-10-CM | POA: Diagnosis not present

## 2024-04-16 DIAGNOSIS — E6609 Other obesity due to excess calories: Secondary | ICD-10-CM | POA: Diagnosis present

## 2024-04-16 DIAGNOSIS — I10 Essential (primary) hypertension: Secondary | ICD-10-CM | POA: Insufficient documentation

## 2024-04-16 DIAGNOSIS — Z6837 Body mass index (BMI) 37.0-37.9, adult: Secondary | ICD-10-CM | POA: Diagnosis present

## 2024-04-16 DIAGNOSIS — R0609 Other forms of dyspnea: Secondary | ICD-10-CM | POA: Insufficient documentation

## 2024-04-16 DIAGNOSIS — E785 Hyperlipidemia, unspecified: Secondary | ICD-10-CM | POA: Insufficient documentation

## 2024-04-16 LAB — LIPID PANEL
Chol/HDL Ratio: 2.2 ratio (ref 0.0–4.4)
Cholesterol, Total: 168 mg/dL (ref 100–199)
HDL: 76 mg/dL (ref >39)
LDL Chol Calc (NIH): 73 mg/dL (ref 0–99)
Triglycerides: 106 mg/dL (ref 0–149)
VLDL Cholesterol Cal: 19 mg/dL (ref 5–40)

## 2024-04-16 MED ORDER — PREGABALIN 50 MG PO CAPS: 50.0000 mg | ORAL_CAPSULE | Freq: Two times a day (BID) | ORAL | 5 refills | Status: AC

## 2024-04-16 MED ORDER — NITROGLYCERIN 0.4 MG SL SUBL: 0.4000 mg | SUBLINGUAL_TABLET | SUBLINGUAL | 3 refills | Status: AC | PRN

## 2024-04-16 NOTE — Patient Instructions (Signed)
 Thank you for choosing [facility]!     Medication Instructions:  No medication changes were made during today's visit.  *If you need a refill on your cardiac medications before your next appointment, please call your pharmacy*   Lab Work: Fasting Lipid Panel If you have labs (blood work) drawn today and your tests are completely normal, you will receive your results only by: MyChart Message (if you have MyChart) OR A paper copy in the mail If you have any lab test that is abnormal or we need to change your treatment, we will call you to review the results.   Testing/Procedures: No procedures were ordered during today's visit.   Your next appointment:   6 month(s)   Provider:   Lynwood Schilling, MD     Follow-Up: At [facility], you and your health needs are our priority.  As part of our continuing mission to provide you with exceptional heart care, we have created designated Provider Care Teams.  These Care Teams include your primary Cardiologist (physician) and Advanced Practice Providers (APPs -  Physician Assistants and Nurse Practitioners) who all work together to provide you with the care you need, when you need it. We recommend signing up for the patient portal called MyChart.  Sign up information is provided on this After Visit Summary.  MyChart is used to connect with patients for Virtual Visits (Telemedicine).  Patients are able to view lab/test results, encounter notes, upcoming appointments, etc.  Non-urgent messages can be sent to your provider as well.   To learn more about what you can do with MyChart, go to [email or web address].

## 2024-04-16 NOTE — Patient Instructions (Signed)
 Check MRI cervical spine without contrast Continue pregablin 50mg  twice daily Follow up 6 months.

## 2024-04-17 ENCOUNTER — Ambulatory Visit: Payer: Self-pay | Admitting: Physician Assistant

## 2024-04-17 NOTE — Telephone Encounter (Signed)
 Patient returned staff call regarding results.

## 2024-04-18 ENCOUNTER — Other Ambulatory Visit: Payer: Self-pay | Admitting: Allergy

## 2024-04-27 ENCOUNTER — Ambulatory Visit
Admission: RE | Admit: 2024-04-27 | Discharge: 2024-04-27 | Disposition: A | Discharge status: Home / Self Care | Source: Ambulatory Visit | Attending: Family Medicine | Admitting: Family Medicine

## 2024-04-27 DIAGNOSIS — Z1231 Encounter for screening mammogram for malignant neoplasm of breast: Secondary | ICD-10-CM

## 2024-05-01 ENCOUNTER — Ambulatory Visit
Admission: RE | Admit: 2024-05-01 | Discharge: 2024-05-01 | Disposition: A | Discharge status: Home / Self Care | Source: Ambulatory Visit | Attending: Neurology | Admitting: Neurology

## 2024-05-01 DIAGNOSIS — M542 Cervicalgia: Secondary | ICD-10-CM

## 2024-05-02 ENCOUNTER — Ambulatory Visit: Payer: Self-pay | Admitting: Neurology

## 2024-05-03 NOTE — Progress Notes (Signed)
Tried calling patient no answer. LMOVM to call the office back.

## 2024-05-04 NOTE — Progress Notes (Signed)
 Patient advised of her results. Patient states she would like more clarification of the Impression 1

## 2024-05-04 NOTE — Progress Notes (Signed)
 LMOVM for patient to call the office back.

## 2024-05-08 NOTE — Progress Notes (Signed)
 Office Visit Note  Patient: Kendra Gilbert             Date of Birth: 26-Mar-1956           MRN: [id number]             PCP: Katina Pfeiffer, PA-C Referring: Katina Pfeiffer, PA-C Visit Date: 05/18/2024   Subjective:  Rash (Patient states she is breaking out on her face and has a lot of fatigue and achey joints, specifically left knee and left thumb. )   Discussed the use of AI scribe software for clinical note transcription with the patient, who gave verbal consent to proceed.  History of Present Illness   Kendra Gilbert is a 68 y.o. female here for follow up for her erosive osteoarthritis now after starting methotrexate 10 mg weekly last month.    She has persistent joint pain, particularly in her knee and thumb, which has not improved and may have worsened slightly with colder weather. There has been no significant improvement in her joint symptoms since starting methotrexate.  She has a rash that feels 'lumpy and kind of like blistery' and is somewhat painful. This rash is different from previous episodes of atopic dermatitis and has not appeared on her face before.  She experiences significant stomach aches and nausea, which she associates with the start of her methotrexate treatment about a month and a half ago. Zinc helps alleviate the nausea. She is currently taking the methotrexate.  She is experiencing an asthma flare-up and reports feeling 'really heavy' in her chest. She has been using her inhalers and did a treatment earlier in the day, but continues to feel 'really heavy' in her chest. She has not recently taken systemic steroids for asthma but has an upcoming appointment with her pulmonologist and asthma specialist.  She has a history of GERD and is on medication for it. She describes having a 'very, very sensitive stomach' that is easily triggered.       Previous HPI 04/11/2024 Kendra Gilbert is a 67 y.o. female here for follow up for her joint pain and  history of RA with findings of erosive arthritis seen here last year.    She experiences ongoing joint pain and inflammation, particularly in her finger joints, accompanied by morning stiffness. Her symptoms worsen with weather changes, and she has not found significant relief from NSAIDs. She describes her condition as deteriorating and feels that her symptoms are not improving.   She has a history of severe asthma and heart issues, which complicate her treatment options. She is not a good candidate for surgery due to risk. 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   She experiences leg swelling, particularly on the left side, which has improved with the use of Lasix  every six hours for the past four days. She cannot wear compression socks due to issues with heat and tightness. Her BNP levels are normal, and there is no protein loss in her urine but despite normal labs she feels symptoms worsening.   She has a history of hepatic steatosis, with liver function tests being monitored. Her liver numbers have been checked multiple times, and her most recent tests are normal.   She is currently taking multiple medications and is concerned about adding more due to potential side effects and interactions. She is physically tired and notes that her 'plate is completely full' with her current health issues.  Previous HPI 04/12/2023 Kendra Gilbert is a 68 y.o. female here for follow up for her inflammatory joint pain and history of RA with findings of erosive arthritis seen here last year.  During that time she had ulnar nerve transposition at the left elbow with a good relief of nerve impingement.  Currently is scheduled for right thumb LRTI surgery next week with Dr. Shari.  She saw Dr. Skeet for persistent headaches and peripheral numbness symptoms was recently started on Lyrica  50 mg twice daily for this.  She has not  had any new recent steroid medications for inflammation and feels these usually just provide very transient improvement.  Still has persistent considerable lower extremity swelling without overlying rashes.  This is bad first thing in the morning as well as later in the day.  Takes furosemide  40 mg only some mornings because the urinary frequency is very limiting/hours after taking. Facial rash is pretty active occasionally associated with small sores or ulcers.  Also has patchy rash and bruising on her forearms without any preceding injury.  Will wake up with new spots in the morning from overnight these are initially red subsequently turned darker and yellow for eventually fading.  Not particularly painful or itchy.   Previous HPI 04/15/2022 Kendra Gilbert is a 68 y.o. female here for follow up for her inflammatory joint pain and history of RA with findings of erosive arthritis seen here last year. She has not been on long term DMARD treatment mostly doing pretty well with use of thumb brace and notices finger joint pains flaring up with weather changes. Her new issue bringing her today is increased pain and swelling in the left leg. Pain at the ankle with radiation up to her knee and swelling that is constantly present and worse as the day goes on. She tried wearing high sock without significant improvement. She had recent PT course with a good improvement in tendinitis. She saw Dr. Kit with [facility] for this and was referred to neurology for evaluation but waiting for this.   Previous HPI 12/19/2020 Kendra Gilbert is a 68 y.o. female here for follow up for her inflammatory joint pain and history of RA. Lab testing at initial visit was negative for RA associated antibodies or ENA panel elements not already excluded. Her sedimentation rate was quite positive at 89. Xrays reviewed did not show much inflammatory arthritis change in her feet but hands showed DIP predominant change with early  central erosions consistent with erosive OA. She has previously already tried joint injection without significant benefit, oral steroids with excessive weight gain, limited tolerance for NSAIDs with GI irritation and asthma symptoms, PT and OT limited benefit, and currently wears right wrist and left ankle braces.   12/05/20 She has reported histroy of RA since many years ago not consistently on treatment. She has been noticing pain and swelling occurring in both hands both feet and ankles intermittently but can be more problematic over time.  Particular has had issues with the right elbow wrist and base of the thumb and in her left ankle which she has been seeing for posterior tibial tendon dysfunction.  She notices occasional swelling but mostly has had bony enlargement of distal finger joints but also in the right wrist and base of the thumb.  She underwent fairly recent injection of seems to be the right first [place] joint but did not have a great amount of relief.  She has discussed with her orthopedic surgeons whether surgical management would be  beneficial or if there is a recommendation for additional medical treatments of an inflammatory arthritis.  She is also been experiencing facial rashes for quite some time that she most often notices exacerbated by heat exposure such as outdoors hot showers and some increased with the mask wearing.     Review of Systems  Constitutional:  Positive for fatigue.  HENT:  Positive for mouth dryness. Negative for mouth sores.   Eyes:  Positive for dryness.  Respiratory:  Positive for shortness of breath.   Cardiovascular:  Positive for chest pain and palpitations.  Gastrointestinal:  Positive for constipation and diarrhea. Negative for blood in stool.  Endocrine: Negative for increased urination.  Genitourinary:  Negative for involuntary urination.  Musculoskeletal:  Positive for joint pain, gait problem, joint pain, joint swelling, myalgias, muscle weakness,  morning stiffness, muscle tenderness and myalgias.  Skin:  Positive for rash and sensitivity to sunlight. Negative for color change and hair loss.  Allergic/Immunologic: Positive for susceptible to infections.  Neurological:  Positive for dizziness and headaches.  Hematological:  Negative for swollen glands.  Psychiatric/Behavioral:  Negative for depressed mood and sleep disturbance. The patient is not nervous/anxious.     PMFS History:  Patient Active Problem List   Diagnosis Date Noted   Precordial chest pain 09/06/2023   Bruising 04/12/2023   CAD (coronary artery disease) 04/28/2022   Obesity 04/28/2022   Pain in left ankle and joints of left foot 04/15/2022   Dyspepsia 02/05/2022   Dysphagia 02/05/2022   Malignant tumor of colon (HCC) 02/05/2022   Other seasonal allergic rhinitis 02/05/2022   Other specified disorders of kidney and ureter 02/05/2022   Other specified symptoms and signs involving the digestive system and abdomen 02/05/2022   Peripheral venous insufficiency 02/05/2022   History of colonic polyps 02/05/2022   Primary osteoarthritis, left ankle and foot 02/05/2022   Rheumatoid arthritis (HCC) 02/05/2022   Skin sensation disturbance 02/05/2022   Leg swelling 12/15/2021   Partial thickness rotator cuff tear 09/09/2021   Impingement syndrome of left shoulder region 08/22/2021   Osteoarthritis of acromioclavicular joint 08/22/2021   Sinusitis 08/05/2021   Pain in joint of left elbow 06/16/2021   Pain in joint of left shoulder 06/16/2021   Erosive osteoarthritis of both hands 12/19/2020   History of rheumatoid arthritis 12/05/2020   Bilateral hand pain 12/05/2020   Tibialis posterior tendinitis 06/30/2020   Pain in left foot 06/30/2020   Acquired short Achilles tendon of left lower extremity 06/30/2020   Radial styloid tenosynovitis of right hand 06/17/2020   Pain and swelling of right wrist 05/02/2020   Localized edema 08/25/2019   Educated about COVID-19 virus  infection 08/25/2019   Alternating constipation and diarrhea 04/23/2019   Blood in feces 04/23/2019   Epigastric pain 04/23/2019   Gastroesophageal reflux disease 04/23/2019   Hematochezia 04/23/2019   Hypercholesterolemia 04/23/2019   Irritable bowel syndrome without diarrhea 04/23/2019   Polyp of colon 04/23/2019   Essential hypertension 02/14/2019   Fibromyalgia 01/16/2019   Primary osteoarthritis involving multiple joints 01/16/2019   DDD (degenerative disc disease), cervical 09/20/2018   Chest pain 02/17/2016   Dyslipidemia 02/17/2016   Family history of heart disease    Dyspnea 01/20/2016   Multiple pulmonary nodules determined by computed tomography of lung 01/20/2016   Unspecified osteoarthritis, unspecified site 11/16/2013   Coronary artery calcification 08/23/2013   Upper airway cough syndrome 08/19/2013   Exertional chest pain 08/19/2013   Restless leg syndrome 11/14/2012   Sleep apnea 10/03/2012  Wheezing 04/12/2011    Past Medical History:  Diagnosis Date   Allergy     Anemia    Asthma    CAD (coronary artery disease)    Moderate, nonobstructive CAD as seen on coronary CTA in 2020 with normal FFR.   Cataracts, bilateral    Chronic kidney disease    cyst in left kidney   Class 2 obesity    Colon cancer [facility]) 2017   surgical tx  removal of polyp   Concussion 04/20/2021   Coronary artery calcification    a. POET in 2015 with no acute findings.   Diarrhea since 02-16-17   c dif test normal per pt   Dyspnea     with exertion, occ at rest no oxygen or inhaler use   GERD (gastroesophageal reflux disease)    H/O: rheumatic fever as child   Headache    occ left side migraine   Heart murmur    Hyperlipidemia    Hypertension    Leg edema    Lung disease    Lung nodule   Neuromuscular disorder (HCC)    Non-alcoholic cirrhosis (HCC) 2013   no current issues with   Pneumonia 2016   Rheumatoid arthritis (HCC)    oa also, back arthritis, hx rheumatoid      Family History  Problem Relation Age of Onset   CAD Mother 39       CABG/stents   Breast cancer Mother    Kidney failure Mother    Arthritis Mother    Cancer Mother    Heart disease Mother    Hypertension Mother    Kidney disease Mother    Asthma Father    CAD Father 41   Allergies Father    Heart disease Father    Hypertension Father    CAD Brother    Heart disease Brother    CAD Brother 20       9 stents   CAD Brother 11       7 stents   Heart attack Brother    Past Surgical History:  Procedure Laterality Date   ABDOMINAL HYSTERECTOMY     ovaries left   ANKLE SURGERY Left 02/12/2021   Arthritis Surgery Right 04/20/2023   COLONOSCOPY WITH PROPOFOL  N/A 03/23/2017   Procedure: COLONOSCOPY WITH PROPOFOL ;  Surgeon: Burnette Fallow, MD;  Location: [facility];  Service: Endoscopy;  Laterality: N/A;   colonscopy  2017   DILATION AND CURETTAGE OF UTERUS     few done for endometriosis   GASTROC RECESSION EXTREMITY Left 02/12/2021   Procedure: Left gastroc recession;  Surgeon: Kit Rush, MD;  Location: [facility];  Service: Orthopedics;  Laterality: Left;   ROTATOR CUFF REPAIR Left 09/2021   athroscopic and open   WRIST SURGERY     Social History   Social History Narrative   Lives alone.     Are you right handed or left handed? Right   Are you currently employed ? retire      Do you live at home alone?with sister in law      What type of home do you live in: 1 story or 2 story? one       Immunization History  Administered Date(s) Administered   INFLUENZA, HIGH DOSE SEASONAL PF 08/05/2022   Influenza,inj,Quad PF,6+ Mos 05/20/2021   Moderna Sars-Covid-2 Vaccination 10/02/2019, 10/30/2019, 05/05/2020, 10/13/2020     Objective: Vital Signs: BP 114/74   Pulse 81   Temp (!) 97.4 F (36.3  C)   Resp 16   Ht 4' 11 (1.499 m)   Wt 184 lb 6.4 oz (83.6 kg)   BMI 37.24 kg/m    Physical Exam Constitutional:      Appearance: She is obese.  Eyes:      Conjunctiva/sclera: Conjunctivae normal.  Cardiovascular:     Rate and Rhythm: Normal rate and regular rhythm.  Pulmonary:     Effort: Pulmonary effort is normal.     Comments: Wheezes throughout, decreased air movement in lung bases Lymphadenopathy:     Cervical: No cervical adenopathy.  Skin:    General: Skin is warm and dry.     Comments: B/l 2+ pitting pedal edema  Neurological:     Mental Status: She is alert.  Psychiatric:        Mood and Affect: Mood normal.      Musculoskeletal Exam:  Elbows full ROM no tenderness or swelling Wrists full ROM no tenderness or swelling Fingers prominent heberdon's nodes of DIPs b/l with some lateral deviations and flexion, wearing brace on left thumb, postsurgical changes right 1st CMC Left knee with anterior bony nodules present, decreased flexion ROM tolerated, no palpable effusion Ankles full ROM no tenderness, overlying swelling without any definite joint effusion  Investigation: No additional findings.  Imaging: MR CERVICAL SPINE WO CONTRAST Result Date: 05/02/2024 EXAM: MRI CERVICAL SPINE WITHOUT CONTRAST 05/01/2024 12:50:55 PM TECHNIQUE: Multiplanar multisequence MRI of the cervical spine was performed without the administration of intravenous contrast. COMPARISON: None available. CLINICAL HISTORY: Neck pain, chronic. Neck pain and numbness rad lt side; X summer 2025, NKI, no sx, no injections. FINDINGS: BONES AND ALIGNMENT: Normal alignment. Normal vertebral body heights. Few scattered benign hemangioma noted, most prominent of which measures 8 mm within the T1 vertebral body. No worrisome osseous lesions. Mild degenerative reactive marrow edema present about the left C4-C5 facet due to facet arthritis (series 307, image 15). Mild to moderate left sided facet hypertrophy. No stenosis. No abnormal enhancement. SPINAL CORD: Normal spinal cord size. Normal spinal cord signal. SOFT TISSUES: No paraspinal mass. C2-C3: No significant disc  herniation. No spinal canal stenosis or neural foraminal narrowing. C3-C4: Small central disc protrusion and density ventral thecal sac (series 309, image 9). No spinal stenosis. Foramina are patent. C4-C5: Small central disc protrusion and density ventral thecal sac (series 309, image 13). Moderate left facet arthrosis. No spinal stenosis. Foramina are patent. C5-C6: Mild degenerative intervertebral disc space narrowing. Central to right paracentral disc protrusion and indents ventral thecal sac (series 309, image 17). No spinal stenosis. Foramina are patent. C6-C7: Small central disc protrusion series 108, image 21). No spinal stenosis. Foramina are patent. C7-T1: Normal interspace. No canal or foraminal stenosis. Minor noncompressive disc bulging noted within the visualized upper thoracic spine without significant stenosis. IMPRESSION: 1. Mild for age multilevel cervical spondylosis with small disc protrusions at C3 C4 through C6-C7 as above. No significant spinal stenosis. No significant foraminal encroachment within the cervical spine. 2. Mild reactive marrow edema about the left C4-5 facet due to facet arthritis. Findings could serve as a source for neck pain and referred left-sided symptoms. Electronically signed by: Morene Hoard MD 05/02/2024 05:06 AM EDT RP Workstation: [id number]   MM 3D SCREENING MAMMOGRAM BILATERAL BREAST Result Date: 05/01/2024 CLINICAL DATA:  Screening. EXAM: DIGITAL SCREENING BILATERAL MAMMOGRAM WITH TOMOSYNTHESIS AND CAD TECHNIQUE: Bilateral screening digital craniocaudal and mediolateral oblique mammograms were obtained. Bilateral screening digital breast tomosynthesis was performed. The images were evaluated with computer-aided detection. COMPARISON:  Previous exam(s). ACR Breast Density Category b: There are scattered areas of fibroglandular density. FINDINGS: There are no findings suspicious for malignancy. IMPRESSION: No mammographic evidence of malignancy. A result  letter of this screening mammogram will be mailed directly to the patient. RECOMMENDATION: Screening mammogram in one year. (Code:SM-B-01Y) BI-RADS CATEGORY  1: Negative. Electronically Signed   By: Curtistine Noble   On: 05/01/2024 16:00    Recent Labs: Lab Results  Component Value Date   WBC 8.5 03/14/2024   HGB 10.6 (L) 03/14/2024   PLT 224 03/14/2024   NA 143 04/12/2023   K 3.6 04/12/2023   CL 104 04/12/2023   CO2 29 04/12/2023   GLUCOSE 102 (H) 04/12/2023   BUN 28 (H) 04/12/2023   CREATININE 1.06 (H) 04/12/2023   BILITOT 0.6 04/11/2024   ALKPHOS 129 (H) 07/12/2022   AST 19 04/11/2024   ALT 17 04/11/2024   PROT 7.4 04/11/2024   ALBUMIN 3.6 (L) 07/12/2022   CALCIUM  9.6 04/12/2023   GFRAA 99 08/18/2020    Speciality Comments: No specialty comments available.  Procedures:  No procedures performed Allergies: Amlodipine , Bee venom, Lisinopril , Other, Aspirin, Cefdinir, Erythromycin, Hctz [hydrochlorothiazide ], and Penicillins   Assessment / Plan:     Visit Diagnoses: Erosive osteoarthritis of both hands - Plan: Sedimentation rate Persistent joint pain and stiffness, particularly in knees and thumbs. So far she feels no major change in Sx. High sedimentation rate indicates ongoing inflammation. Methotrexate ineffective. - Rechecking previously very high ESR - Discontinued methotrexate. - Consider switching to leflunomide (Arava) for inflammatory arthritis management.  High risk medication use - Plan: CBC with Differential/Platelet, Comprehensive metabolic panel with GFR  Exacerbation of persistent asthma, unspecified asthma severity Acute exacerbation with wheezing and decreased air movement, likely triggered by cold air and allergies. Current inhaler use insufficient. - After lab review if Sx not resolved next week recommend 5-day course of oral steroids. - F/U with pulmonary and asthma specialists in January  Nausea - Plan: ondansetron  (ZOFRAN -ODT) 4 MG disintegrating  tablet Methotrexate-induced gastrointestinal side effects  Atopic dermatitis Rash on face, possibly related to atopic dermatitis. May improve with steroid treatment for asthma exacerbation. Appears atypical for methotrexate drug reaction but per pt is entirely new presentation of rash so will recommend stopping drug as discussed.    Orders: Orders Placed This Encounter  Procedures   Sedimentation rate   CBC with Differential/Platelet   Comprehensive metabolic panel with GFR   Meds ordered this encounter  Medications   ondansetron  (ZOFRAN -ODT) 4 MG disintegrating tablet    Sig: Take 1 tablet (4 mg total) by mouth every 8 (eight) hours as needed for nausea or vomiting.    Dispense:  20 tablet    Refill:  0     Follow-Up Instructions: Return in about 2 months (around 07/18/2024) for [facility] ?LEF f/u 2mos.   Lonni LELON Ester, MD  Note - This record has been created using [employer].  Chart creation errors have been sought, but may not always  have been located. Such creation errors do not reflect on  the standard of medical care.

## 2024-05-18 ENCOUNTER — Ambulatory Visit: Attending: Internal Medicine | Admitting: Internal Medicine

## 2024-05-18 ENCOUNTER — Encounter: Payer: Self-pay | Admitting: Internal Medicine

## 2024-05-18 VITALS — BP 114/74 | HR 81 | Temp 97.4°F | Resp 16 | Ht 59.0 in | Wt 184.4 lb

## 2024-05-18 DIAGNOSIS — M154 Erosive (osteo)arthritis: Secondary | ICD-10-CM | POA: Insufficient documentation

## 2024-05-18 DIAGNOSIS — J45901 Unspecified asthma with (acute) exacerbation: Secondary | ICD-10-CM | POA: Insufficient documentation

## 2024-05-18 DIAGNOSIS — Z79899 Other long term (current) drug therapy: Secondary | ICD-10-CM | POA: Insufficient documentation

## 2024-05-18 DIAGNOSIS — R110 Nausea: Secondary | ICD-10-CM | POA: Insufficient documentation

## 2024-05-18 MED ORDER — ONDANSETRON 4 MG PO TBDP: 4.0000 mg | ORAL_TABLET | Freq: Three times a day (TID) | ORAL | 0 refills | Status: DC | PRN

## 2024-05-19 LAB — COMPREHENSIVE METABOLIC PANEL WITH GFR
AG Ratio: 1.1 (calc) (ref 1.0–2.5)
ALT: 18 U/L (ref 6–29)
AST: 24 U/L (ref 10–35)
Albumin: 3.9 g/dL (ref 3.6–5.1)
Alkaline phosphatase (APISO): 112 U/L (ref 37–153)
BUN/Creatinine Ratio: 28 (calc) — ABNORMAL HIGH (ref 6–22)
BUN: 30 mg/dL — ABNORMAL HIGH (ref 7–25)
CO2: 26 mmol/L (ref 20–32)
Calcium: 9.5 mg/dL (ref 8.6–10.4)
Chloride: 105 mmol/L (ref 98–110)
Creat: 1.08 mg/dL — ABNORMAL HIGH (ref 0.50–1.05)
Globulin: 3.5 g/dL (ref 1.9–3.7)
Glucose, Bld: 93 mg/dL (ref 65–99)
Potassium: 3.8 mmol/L (ref 3.5–5.3)
Sodium: 143 mmol/L (ref 135–146)
Total Bilirubin: 0.5 mg/dL (ref 0.2–1.2)
Total Protein: 7.4 g/dL (ref 6.1–8.1)
eGFR: 56 mL/min/1.73m2 — ABNORMAL LOW (ref >=60)

## 2024-05-19 LAB — SEDIMENTATION RATE: Sed Rate: 108 mm/h — ABNORMAL HIGH (ref 0–30)

## 2024-05-19 LAB — CBC WITH DIFFERENTIAL/PLATELET
Absolute Lymphocytes: 1089 {cells}/uL (ref 850–3900)
Absolute Monocytes: 564 {cells}/uL (ref 200–950)
Basophils Absolute: 20 {cells}/uL (ref 0–200)
Basophils Relative: 0.2 %
Eosinophils Absolute: 50 {cells}/uL (ref 15–500)
Eosinophils Relative: 0.5 %
HCT: 32.9 % — ABNORMAL LOW (ref 35.0–45.0)
Hemoglobin: 10.5 g/dL — ABNORMAL LOW (ref 11.7–15.5)
MCH: 27.2 pg (ref 27.0–33.0)
MCHC: 31.9 g/dL — ABNORMAL LOW (ref 32.0–36.0)
MCV: 85.2 fL (ref 80.0–100.0)
MPV: 11.4 fL (ref 7.5–12.5)
Monocytes Relative: 5.7 %
Neutro Abs: 8177 {cells}/uL — ABNORMAL HIGH (ref 1500–7800)
Neutrophils Relative %: 82.6 %
Platelets: 198 Thousand/uL (ref 140–400)
RBC: 3.86 Million/uL (ref 3.80–5.10)
RDW: 15.1 % — ABNORMAL HIGH (ref 11.0–15.0)
Total Lymphocyte: 11 %
WBC: 9.9 Thousand/uL (ref 3.8–10.8)

## 2024-05-24 ENCOUNTER — Other Ambulatory Visit: Payer: Self-pay | Admitting: Allergy

## 2024-07-11 ENCOUNTER — Encounter: Payer: Self-pay | Admitting: Allergy

## 2024-07-11 ENCOUNTER — Other Ambulatory Visit: Payer: Self-pay

## 2024-07-11 ENCOUNTER — Ambulatory Visit (INDEPENDENT_AMBULATORY_CARE_PROVIDER_SITE_OTHER): Admitting: Allergy

## 2024-07-11 ENCOUNTER — Telehealth: Payer: Self-pay | Admitting: *Deleted

## 2024-07-11 VITALS — BP 110/62 | HR 94 | Temp 97.5°F | Resp 18

## 2024-07-11 DIAGNOSIS — J3089 Other allergic rhinitis: Secondary | ICD-10-CM | POA: Diagnosis not present

## 2024-07-11 DIAGNOSIS — M19079 Primary osteoarthritis, unspecified ankle and foot: Secondary | ICD-10-CM | POA: Insufficient documentation

## 2024-07-11 DIAGNOSIS — J4550 Severe persistent asthma, uncomplicated: Secondary | ICD-10-CM

## 2024-07-11 DIAGNOSIS — J302 Other seasonal allergic rhinitis: Secondary | ICD-10-CM | POA: Insufficient documentation

## 2024-07-11 MED ORDER — CARBINOXAMINE MALEATE 4 MG PO TABS: ORAL_TABLET | ORAL | 5 refills | Status: DC

## 2024-07-11 MED ORDER — TRELEGY ELLIPTA 200-62.5-25 MCG/ACT IN AEPB: 1.0000 | INHALATION_SPRAY | Freq: Every day | RESPIRATORY_TRACT | 5 refills | Status: AC

## 2024-07-11 MED ORDER — AZELASTINE HCL 0.05 % OP SOLN: 1.0000 [drp] | Freq: Two times a day (BID) | OPHTHALMIC | 5 refills | Status: AC | PRN

## 2024-07-11 NOTE — Telephone Encounter (Signed)
 Received drug change request for Carbinoxamine  4 mg tabs. Preferred is Desloratadine or cetirizine. Her PA approval needs to be renewed for this year. We do not have a current insurance card scanned in.

## 2024-07-11 NOTE — Progress Notes (Signed)
 "   Follow-up Note  RE: Kendra Gilbert MRN: [id number] DOB: 02/01/1956 Date of Office Visit: 07/11/2024   History of present illness: Kendra Gilbert is a 69 y.o. female presenting today for follow-up of severe persistent asthma, allergic rhinitis with conjunctivitis and vasomotor rhinitis.  She was last seen in the office on 11/17/23 by myself.  Discussed the use of AI scribe software for clinical note transcription with the patient, who gave verbal consent to proceed.  She experiences frequent coughing, nasal discharge, and occasional epistaxis, primarily on one side. Chest pain occurs, especially in cooler weather. She uses her rescue inhaler, albuterol , a couple of times per week for difficulty breathing.  She uses her albuterol  prior to her leaving the house to head to the bus stop as this will can cause shortness of breath.  She leaves for the bus stop at 6 AM to avoi minimize d temperature-related breathing issues.  Her current asthma management includes Arnuity and Breztri . There is a concern about insurance coverage for Breztri  as she received a letter stating it was no longer be covered and that they covered possible alternative is Trelegy. She currently has a good supply of Breztri .  She notes a lot has gone on healthwise since her last visit.  She has had surgery on her left wrist area and is in a cast currently.  She states this is a lengthy process and she will need to be recasted for a another period of time before will be able to transition to a sling. She also reports having a reaction to methotrexate  where she developed a rash and stomach pain.  She has a issues with anemia as well as issues with her kidneys, which have been part of her ongoing health concerns.  She mentions losing weight due to inflammation and wrist surgery which she believes this weight loss has helped with her  breathing.  She states around the time she was to start on the carbinoxamine  she was having  health issues as above and thus did not get around to being able to take this on a consistent basis to see if it would be helpful as an antihistamine for her.  She does have it at home.  She has been holding off on using nasal sprays and mentions an potential error with her eye drop prescription.  She was filled azelastine  eyedrop was previously on the [place].  She has not used this yet as she wanted to verify this was okay to use. She received her flu shot in October.    Review of systems: 10pt ROS negative unless noted above in HPI  Past medical/social/surgical/family history have been reviewed and are unchanged unless specifically indicated below.  No changes  Medication List: Current Outpatient Medications  Medication Sig Dispense Refill   acetaminophen  (TYLENOL ) 500 MG tablet Take 325 mg by mouth daily as needed for mild pain, fever or headache.     ARNUITY ELLIPTA  200 MCG/ACT AEPB INHALE 1 PUFF INTO THE LUNGS DAILY 30 each 5   BREZTRI  AEROSPHERE 160-9-4.8 MCG/ACT AERO inhaler INHALE 2 PUFFS INTO THE LUNGS TWICE DAILY TO PREVENT COUGH OR WHEEZE AND RINSE MOUTH AFTERWARDS 10.7 g 4   diphenhydrAMINE (BENADRYL) 25 MG tablet Take 25 mg by mouth every 6 (six) hours as needed.     EPINEPHrine  0.3 mg/0.3 mL IJ SOAJ injection Inject 0.3 mg into the muscle as needed for anaphylaxis. 2 each 1   Fluticasone -Umeclidin-Vilant (TRELEGY ELLIPTA ) 200-62.5-25 MCG/ACT AEPB Inhale 1 puff into the  lungs daily. Rinse mouth after each use. 60 each 5   folic acid  (FOLVITE ) 1 MG tablet Take 1 tablet (1 mg total) by mouth daily. 90 tablet 0   furosemide  (LASIX ) 40 MG tablet TAKE 1 TABLET BY MOUTH DAILY AS NEEDED FOR WEIGHT GAIN OF 3LBS OVERNIGHT OR 5LBS IN 1 WEEK 90 tablet 3   ipratropium-albuterol  (DUONEB) 0.5-2.5 (3) MG/3ML SOLN Use 1 vial via nebulizer every 4-6 hours as needed for cough, wheeze, shortness of breath or chest tightness 180 mL 1   isosorbide  mononitrate (IMDUR ) 30 MG 24 hr tablet Take 1 tablet  (30 mg total) by mouth daily. 90 tablet 3   levocetirizine (XYZAL ) 5 MG tablet TAKE 1 TABLET(5 MG) BY MOUTH EVERY EVENING 90 tablet 0   losartan  (COZAAR ) 25 MG tablet TAKE 1 TABLET(25 MG) BY MOUTH IN THE MORNING AND AT BEDTIME 180 tablet 3   nitroGLYCERIN  (NITROSTAT ) 0.4 MG SL tablet Place 1 tablet (0.4 mg total) under the tongue every 5 (five) minutes as needed for chest pain. 25 tablet 3   ondansetron  (ZOFRAN -ODT) 4 MG disintegrating tablet Take 1 tablet (4 mg total) by mouth every 8 (eight) hours as needed for nausea or vomiting. 20 tablet 0   pantoprazole  (PROTONIX ) 40 MG tablet Take 40 mg by mouth 2 (two) times daily.     pregabalin  (LYRICA ) 50 MG capsule Take 1 capsule (50 mg total) by mouth 2 (two) times daily. 60 capsule 5   RESTASIS 0.05 % ophthalmic emulsion 1 drop 2 (two) times daily.     rosuvastatin  (CRESTOR ) 20 MG tablet Take 1 tablet (20 mg total) by mouth daily. 90 tablet 3   Sodium Chloride -Sodium Bicarb 1.57 g PACK Place into the nose.     VENTOLIN  HFA 108 (90 Base) MCG/ACT inhaler Inhale 2 puffs into the lungs every 4 (four) hours as needed for wheezing or shortness of breath. 18 g 0   azelastine  (OPTIVAR ) 0.05 % ophthalmic solution Place 1 drop into both eyes 2 (two) times daily as needed (Itchy, watery eyes). 6 mL 5   Carbinoxamine  Maleate 4 MG TABS 1 tab 1-2 times a day 56 tablet 5   No current facility-administered medications for this visit.     Known medication allergies: Allergies[1]   Physical examination: Blood pressure 110/62, pulse 94, temperature (!) 97.5 F (36.4 C), resp. rate 18, SpO2 96%.  General: Alert, interactive, in no acute distress. HEENT: PERRLA, TMs pearly gray, turbinates minimally edematous without discharge, post-pharynx non erythematous. Neck: Supple without lymphadenopathy. Lungs: Mildly decreased breath sounds bilaterally without wheezing, rhonchi or rales. {no increased work of breathing. CV: Normal S1, S2 without murmurs. Abdomen:  Nondistended, nontender. Skin: Warm and dry, without lesions or rashes. Extremities: Left wrist/hand in a cast to mid forearm  neuro:   Grossly intact.  Diagnostics/Labs:  Spirometry: FEV1: 1.03L 54%, FVC: 1.35L 56% predicted.  This is an slightly improved lung function from her previous over the summer  Assessment and plan: Severe persistent asthma Allergic rhinitis with conjunctivitis Vasomotor rhinitis  - continue avoidance measures for grass pollens, weed pollens, tree pollens, mold, dust mites. - Carbinoxamine  4mg  1 tab 1-2 times a day as needed for allergy  symptoms.   Would recommend starting it back up daily in early to mid February for pollen season.  - use Azelastine  1 drop each eye twice a day as needed for itchy/watery eyes.   - have access to albuterol  inhaler 2 puffs or albuterol  1 vial via nebulizer every 4-6 hours  as needed for cough/wheeze/shortness of breath/chest tightness.  May use 15-20 minutes prior to activity.   Monitor frequency of use.    Use inhaler with spacer device.   - continue Breztri  2 puffs twice a day for now while you have supply.  Will change to Trelegy 1 puff daily for insurance coverage as this appears to be covered with your insurance plan.  - continue Arnuity 1 puff daily.   - ok for Tezspire monthly injections after discussion with Dr Annella as well next month.  Asthma control goals:  Full participation in all desired activities (may need albuterol  before activity) Albuterol  use two time or less a w eek on average (not counting use with activity) Cough interfering with sleep two time or less a month Oral steroids no more than once a year No hospitalizations   Follow-up in 4-6 months or sooner if needed  I appreciate the opportunity to take part in Kendra Gilbert's care. Please do not hesitate to contact me with questions.  Sincerely,   Danita Brain, MD Allergy /Immunology [facility]       [1]   Allergies Allergen Reactions   Amlodipine  Shortness Of Breath and Swelling   Bee Venom Anaphylaxis and Hives   Lisinopril  Itching and Cough    Dry cough and severe itching in the throat    Other Other (See Comments)    Mosquito bites cause hives all over body   Aspirin Other (See Comments)    Causes stomach problems  aluminum aspirin   Cefdinir Other (See Comments)    Other reaction(s): hives  cefdinir   Erythromycin Other (See Comments)    Ate the lining of her stomach   Hctz [Hydrochlorothiazide ]     Rash    Penicillins Rash and Dermatitis    Has patient had a PCN reaction causing immediate rash, facial/tongue/throat swelling, SOB or lightheadedness with hypotension: Yes  Has patient had a PCN reaction causing severe rash involving mucus membranes or skin necrosis: Yes  Has patient had a PCN reaction that required hospitalization: No   Has patient had a PCN reaction occurring within the last 10 years: No  If all of the above answers are NO, then may proceed with Cephalosporin use.   "

## 2024-07-11 NOTE — Patient Instructions (Addendum)
-   continue avoidance measures for grass pollens, weed pollens, tree pollens, mold, dust mites. - Carbinoxamine  4mg  1 tab 1-2 times a day as needed for allergy  symptoms.   Would recommend starting it back up daily in early to mid February for pollen season.  - use Azelastine  1 drop each eye twice a day as needed for itchy/watery eyes.   - have access to albuterol  inhaler 2 puffs or albuterol  1 vial via nebulizer every 4-6 hours as needed for cough/wheeze/shortness of breath/chest tightness.  May use 15-20 minutes prior to activity.   Monitor frequency of use.    Use inhaler with spacer device.   - continue Breztri  2 puffs twice a day for now while you have supply.  Will change to Trelegy 1 puff daily for insurance coverage as this appears to be covered with your insurance plan.  - continue Arnuity 1 puff daily.   - ok for Tezspire monthly injections after discussion with Dr Annella as well next month.  Asthma control goals:  Full participation in all desired activities (may need albuterol  before activity) Albuterol  use two time or less a w eek on average (not counting use with activity) Cough interfering with sleep two time or less a month Oral steroids no more than once a year No hospitalizations   Follow-up in 4-6 months or sooner if needed

## 2024-07-12 ENCOUNTER — Other Ambulatory Visit (HOSPITAL_BASED_OUTPATIENT_CLINIC_OR_DEPARTMENT_OTHER): Payer: Self-pay | Admitting: Family Medicine

## 2024-07-12 DIAGNOSIS — M810 Age-related osteoporosis without current pathological fracture: Secondary | ICD-10-CM

## 2024-07-13 ENCOUNTER — Telehealth: Payer: Self-pay

## 2024-07-13 ENCOUNTER — Other Ambulatory Visit (HOSPITAL_COMMUNITY): Payer: Self-pay

## 2024-07-13 NOTE — Telephone Encounter (Signed)
*  AA  Pharmacy Patient Advocate Encounter   Received notification from Pt Calls Messages that prior authorization for Carbinoxamine  4mg  tab is required/requested.   Insurance verification completed.   The patient is insured through [employer].   Per test claim: PA required; PA submitted to above mentioned insurance via Latent Key/confirmation #/EOC [id number] Status is pending

## 2024-07-13 NOTE — Telephone Encounter (Signed)
 The drug you asked for is not listed in your preferred drug list (formulary). The preferred drug(s), you may not have tried are: desloratadine  tablet. Your provider needs to give us  medical reasons why the preferred drug(s) would not work for you and/or would have bad side effects.

## 2024-07-17 ENCOUNTER — Other Ambulatory Visit: Payer: Self-pay | Admitting: *Deleted

## 2024-07-17 MED ORDER — DESLORATADINE 5 MG PO TABS: 5.0000 mg | ORAL_TABLET | Freq: Every day | ORAL | 5 refills | Status: AC | PRN

## 2024-07-17 NOTE — Telephone Encounter (Signed)
New prescription has been sent in. Called patient and left a voicemail asking for a return call to inform.

## 2024-07-18 ENCOUNTER — Telehealth: Payer: Self-pay | Admitting: Allergy

## 2024-07-18 NOTE — Telephone Encounter (Signed)
 Pt calling to tell Dr. Jeneal that another medication has been changed as far as the tier on her insurance. She was instructed to contact her provider about an alternative to  Levocetirizine 5 mg tabs. Best contact [phone]

## 2024-07-18 NOTE — Telephone Encounter (Signed)
"  I called the patient and informed.  "

## 2024-07-18 NOTE — Telephone Encounter (Signed)
 I called the patient and informed of the desloratadine .

## 2024-07-20 ENCOUNTER — Telehealth: Payer: Self-pay | Admitting: Cardiology

## 2024-07-20 NOTE — Telephone Encounter (Signed)
 Left detailed message You have medicaid, the medication is on the preferred list. There shouldn't be an increase in cost. Copay should be $4. Please call us  if there is an issue when refilling at pharmacy, but there should not be.

## 2024-07-20 NOTE — Telephone Encounter (Signed)
 She has medicaid and its on the preferred list. There shouldn't be an increase in cost. Copay should be $4.   She should call us  if there is an issue when she goes to refill at pharmacy, but there should not be

## 2024-07-20 NOTE — Telephone Encounter (Signed)
 Pt states she received a letter from insurance stating a tier change with isosorbide . She states there was no alternatives suggested on the paper and she isn't sure what is covered. Let pt know that I would reach out to pharmacy team and dr for a solution. Pt states she has about a week of this medication left.

## 2024-07-20 NOTE — Telephone Encounter (Signed)
 Pt c/o medication issue:  1. Name of Medication:   isosorbide  mononitrate (IMDUR ) 30 MG 24 hr tablet    2. How are you currently taking this medication (dosage and times per day)?   3. Are you having a reaction (difficulty breathing--STAT)?   4. What is your medication issue?   Patient says there has been a negative tier change with the medication and she has been advised to contact her healthcare provider to discuss. Please advise.

## 2024-07-25 NOTE — Progress Notes (Unsigned)
 "  Office Visit Note  Patient: Kendra Gilbert             Date of Birth: 1955-07-12           MRN: [id number]             PCP: Katina Pfeiffer, PA-C Referring: Katina Pfeiffer, PA-C Visit Date: 08/07/2024   Subjective:  No chief complaint on file.   History of Present Illness: Kendra Gilbert is a 69 y.o. female here for follow up for her erosive osteoarthritis now after starting methotrexate  10 mg weekly last month.     Previous HPI 05/18/2024 Kendra Gilbert is a 69 y.o. female here for follow up for her erosive osteoarthritis now after starting methotrexate  10 mg weekly last month.     She has persistent joint pain, particularly in her knee and thumb, which has not improved and may have worsened slightly with colder weather. There has been no significant improvement in her joint symptoms since starting methotrexate .   She has a rash that feels 'lumpy and kind of like blistery' and is somewhat painful. This rash is different from previous episodes of atopic dermatitis and has not appeared on her face before.   She experiences significant stomach aches and nausea, which she associates with the start of her methotrexate  treatment about a month and a half ago. Zinc helps alleviate the nausea. She is currently taking the methotrexate .   She is experiencing an asthma flare-up and reports feeling 'really heavy' in her chest. She has been using her inhalers and did a treatment earlier in the day, but continues to feel 'really heavy' in her chest. She has not recently taken systemic steroids for asthma but has an upcoming appointment with her pulmonologist and asthma specialist.   She has a history of GERD and is on medication for it. She describes having a 'very, very sensitive stomach' that is easily triggered.         Previous HPI 04/11/2024 Kendra Gilbert is a 69 y.o. female here for follow up for her joint pain and history of RA with findings of erosive arthritis seen  here last year.    She experiences ongoing joint pain and inflammation, particularly in her finger joints, accompanied by morning stiffness. Her symptoms worsen with weather changes, and she has not found significant relief from NSAIDs. She describes her condition as deteriorating and feels that her symptoms are not improving.   She has a history of severe asthma and heart issues, which complicate her treatment options. She is not a good candidate for surgery due to risk. 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   She experiences leg swelling, particularly on the left side, which has improved with the use of Lasix  every six hours for the past four days. She cannot wear compression socks due to issues with heat and tightness. Her BNP levels are normal, and there is no protein loss in her urine but despite normal labs she feels symptoms worsening.   She has a history of hepatic steatosis, with liver function tests being monitored. Her liver numbers have been checked multiple times, and her most recent tests are normal.   She is currently taking multiple medications and is concerned about adding more due to potential side effects and interactions. She is physically tired and notes that her 'plate is completely full' with her current health issues.  Previous HPI 04/12/2023 Kendra Gilbert is a 69 y.o. female here for follow up for her inflammatory joint pain and history of RA with findings of erosive arthritis seen here last year.  During that time she had ulnar nerve transposition at the left elbow with a good relief of nerve impingement.  Currently is scheduled for right thumb LRTI surgery next week with Dr. Shari.  She saw Dr. Skeet for persistent headaches and peripheral numbness symptoms was recently started on Lyrica  50 mg twice daily for this.  She has not had any new recent steroid medications for  inflammation and feels these usually just provide very transient improvement.  Still has persistent considerable lower extremity swelling without overlying rashes.  This is bad first thing in the morning as well as later in the day.  Takes furosemide  40 mg only some mornings because the urinary frequency is very limiting/hours after taking. Facial rash is pretty active occasionally associated with small sores or ulcers.  Also has patchy rash and bruising on her forearms without any preceding injury.  Will wake up with new spots in the morning from overnight these are initially red subsequently turned darker and yellow for eventually fading.  Not particularly painful or itchy.   Previous HPI 04/15/2022 Kendra Gilbert is a 69 y.o. female here for follow up for her inflammatory joint pain and history of RA with findings of erosive arthritis seen here last year. She has not been on long term DMARD treatment mostly doing pretty well with use of thumb brace and notices finger joint pains flaring up with weather changes. Her new issue bringing her today is increased pain and swelling in the left leg. Pain at the ankle with radiation up to her knee and swelling that is constantly present and worse as the day goes on. She tried wearing high sock without significant improvement. She had recent PT course with a good improvement in tendinitis. She saw Dr. Kit with [facility] for this and was referred to neurology for evaluation but waiting for this.   Previous HPI 12/19/2020 Kendra Gilbert is a 69 y.o. female here for follow up for her inflammatory joint pain and history of RA. Lab testing at initial visit was negative for RA associated antibodies or ENA panel elements not already excluded. Her sedimentation rate was quite positive at 89. Xrays reviewed did not show much inflammatory arthritis change in her feet but hands showed DIP predominant change with early central erosions consistent with erosive OA.  She has previously already tried joint injection without significant benefit, oral steroids with excessive weight gain, limited tolerance for NSAIDs with GI irritation and asthma symptoms, PT and OT limited benefit, and currently wears right wrist and left ankle braces.   12/05/20 She has reported histroy of RA since many years ago not consistently on treatment. She has been noticing pain and swelling occurring in both hands both feet and ankles intermittently but can be more problematic over time.  Particular has had issues with the right elbow wrist and base of the thumb and in her left ankle which she has been seeing for posterior tibial tendon dysfunction.  She notices occasional swelling but mostly has had bony enlargement of distal finger joints but also in the right wrist and base of the thumb.  She underwent fairly recent injection of seems to be the right first [facility] joint but did not have a great amount of relief.  She has discussed with her orthopedic surgeons whether surgical management would be  beneficial or if there is a recommendation for additional medical treatments of an inflammatory arthritis.  She is also been experiencing facial rashes for quite some time that she most often notices exacerbated by heat exposure such as outdoors hot showers and some increased with the mask wearing.     No Rheumatology ROS completed.   PMFS History:  Patient Active Problem List   Diagnosis Date Noted   Primary localized osteoarthrosis of ankle and foot 07/11/2024   Seasonal allergies 07/11/2024   Precordial chest pain 09/06/2023   Bruising 04/12/2023   CAD (coronary artery disease) 04/28/2022   Obesity 04/28/2022   Pain in left ankle and joints of left foot 04/15/2022   Dyspepsia 02/05/2022   Dysphagia 02/05/2022   Malignant tumor of colon (HCC) 02/05/2022   Other seasonal allergic rhinitis 02/05/2022   Other specified disorders of kidney and ureter 02/05/2022   Other specified symptoms and  signs involving the digestive system and abdomen 02/05/2022   Peripheral venous insufficiency 02/05/2022   History of colonic polyps 02/05/2022   Primary osteoarthritis, left ankle and foot 02/05/2022   Skin sensation disturbance 02/05/2022   Leg swelling 12/15/2021   Partial thickness rotator cuff tear 09/09/2021   Impingement syndrome of left shoulder region 08/22/2021   Osteoarthritis of acromioclavicular joint 08/22/2021   Sinusitis 08/05/2021   Pain in joint of left elbow 06/16/2021   Pain in joint of left shoulder 06/16/2021   Erosive osteoarthritis of both hands 12/19/2020   Bilateral hand pain 12/05/2020   Tibialis posterior tendinitis 06/30/2020   Pain in left foot 06/30/2020   Acquired short Achilles tendon of left lower extremity 06/30/2020   Radial styloid tenosynovitis of right hand 06/17/2020   Pain and swelling of right wrist 05/02/2020   Localized edema 08/25/2019   Blood in feces 04/23/2019   Epigastric pain 04/23/2019   Gastroesophageal reflux disease 04/23/2019   Hematochezia 04/23/2019   Hypercholesterolemia 04/23/2019   Irritable bowel syndrome without diarrhea 04/23/2019   Polyp of colon 04/23/2019   Essential hypertension 02/14/2019   Fibromyalgia 01/16/2019   Primary osteoarthritis involving multiple joints 01/16/2019   DDD (degenerative disc disease), cervical 09/20/2018   Dyslipidemia 02/17/2016   Family history of heart disease    Dyspnea 01/20/2016   Multiple pulmonary nodules determined by computed tomography of lung 01/20/2016   Unspecified osteoarthritis, unspecified site 11/16/2013   Coronary artery calcification 08/23/2013   Upper airway cough syndrome 08/19/2013   Restless leg syndrome 11/14/2012   Wheezing 04/12/2011    Past Medical History:  Diagnosis Date   Allergy     Anemia    Asthma    CAD (coronary artery disease)    Moderate, nonobstructive CAD as seen on coronary CTA in 2020 with normal FFR.   Cataracts, bilateral    Chronic  kidney disease    cyst in left kidney   Class 2 obesity    Colon cancer [facility]) 2017   surgical tx  removal of polyp   Concussion 04/20/2021   Coronary artery calcification    a. POET in 2015 with no acute findings.   Diarrhea since 02-16-17   c dif test normal per pt   Dyspnea     with exertion, occ at rest no oxygen or inhaler use   GERD (gastroesophageal reflux disease)    H/O: rheumatic fever as child   Headache    occ left side migraine   Heart murmur    Hyperlipidemia    Hypertension    Leg edema  Lung disease    Lung nodule   Neuromuscular disorder (HCC)    Non-alcoholic cirrhosis (HCC) 2013   no current issues with   Pneumonia 2016   Rheumatoid arthritis (HCC)    oa also, back arthritis, hx rheumatoid     Family History  Problem Relation Age of Onset   CAD Mother 17       CABG/stents   Breast cancer Mother    Kidney failure Mother    Arthritis Mother    Cancer Mother    Heart disease Mother    Hypertension Mother    Kidney disease Mother    Asthma Father    CAD Father 11   Allergies Father    Heart disease Father    Hypertension Father    CAD Brother    Heart disease Brother    CAD Brother 2       9 stents   CAD Brother 18       7 stents   Heart attack Brother    Past Surgical History:  Procedure Laterality Date   ABDOMINAL HYSTERECTOMY     ovaries left   ANKLE SURGERY Left 02/12/2021   Arthritis Surgery Right 04/20/2023   COLONOSCOPY WITH PROPOFOL  N/A 03/23/2017   Procedure: COLONOSCOPY WITH PROPOFOL ;  Surgeon: Burnette Fallow, MD;  Location: [facility];  Service: Endoscopy;  Laterality: N/A;   colonscopy  2017   DILATION AND CURETTAGE OF UTERUS     few done for endometriosis   GASTROC RECESSION EXTREMITY Left 02/12/2021   Procedure: Left gastroc recession;  Surgeon: Kit Rush, MD;  Location: [facility];  Service: Orthopedics;  Laterality: Left;   ROTATOR CUFF REPAIR Left 09/2021   athroscopic and open   WRIST SURGERY      Social History   Social History Narrative   Lives alone.     Are you right handed or left handed? Right   Are you currently employed ? retire      Do you live at home alone?with sister in law      What type of home do you live in: 1 story or 2 story? one       Immunization History  Administered Date(s) Administered   INFLUENZA, HIGH DOSE SEASONAL PF 08/05/2022   Influenza,inj,Quad PF,6+ Mos 05/20/2021   Moderna Covid-19 Vaccine Bivalent Booster 84yrs & up 04/20/2021   Moderna Sars-Covid-2 Vaccination 10/02/2019, 10/30/2019, 05/05/2020, 10/13/2020, 04/08/2021, 03/17/2023, 04/18/2024     Objective: Vital Signs: There were no vitals taken for this visit.   Physical Exam   Musculoskeletal Exam: ***   Investigation: No additional findings.  Imaging: No results found.  Recent Labs: Lab Results  Component Value Date   WBC 9.9 05/18/2024   HGB 10.5 (L) 05/18/2024   PLT 198 05/18/2024   NA 143 05/18/2024   K 3.8 05/18/2024   CL 105 05/18/2024   CO2 26 05/18/2024   GLUCOSE 93 05/18/2024   BUN 30 (H) 05/18/2024   CREATININE 1.08 (H) 05/18/2024   BILITOT 0.5 05/18/2024   ALKPHOS 129 (H) 07/12/2022   AST 24 05/18/2024   ALT 18 05/18/2024   PROT 7.4 05/18/2024   ALBUMIN 3.6 (L) 07/12/2022   CALCIUM  9.5 05/18/2024   GFRAA 99 08/18/2020    Speciality Comments: No specialty comments available.  Procedures:  No procedures performed Allergies: Amlodipine , Bee venom, Lisinopril , Other, Aspirin, Cefdinir, Erythromycin, Hctz [hydrochlorothiazide ], and Penicillins   Assessment / Plan:     Visit Diagnoses:  Assessment & Plan  Erosive osteoarthritis of both hands     High risk medication use     Exacerbation of persistent asthma, unspecified asthma severity      ***  Follow-Up Instructions: No follow-ups on file.   Dailon Sheeran M Gearlene Godsil, CMA  Note - This record has been created using Animal nutritionist.  Chart creation errors have been sought, but may not  always  have been located. Such creation errors do not reflect on  the standard of medical care. "

## 2024-07-25 NOTE — Assessment & Plan Note (Signed)
 SABRA

## 2024-08-07 ENCOUNTER — Ambulatory Visit: Admitting: Internal Medicine

## 2024-08-07 DIAGNOSIS — Z79899 Other long term (current) drug therapy: Secondary | ICD-10-CM

## 2024-08-07 DIAGNOSIS — J45901 Unspecified asthma with (acute) exacerbation: Secondary | ICD-10-CM

## 2024-08-07 DIAGNOSIS — M154 Erosive (osteo)arthritis: Secondary | ICD-10-CM

## 2024-08-08 ENCOUNTER — Encounter: Payer: Self-pay | Admitting: Pulmonary Disease

## 2024-08-08 ENCOUNTER — Ambulatory Visit: Admitting: Pulmonary Disease

## 2024-08-08 VITALS — BP 110/58 | HR 88 | Ht 59.0 in | Wt 176.0 lb

## 2024-08-08 DIAGNOSIS — J4550 Severe persistent asthma, uncomplicated: Secondary | ICD-10-CM

## 2024-08-08 DIAGNOSIS — R0609 Other forms of dyspnea: Secondary | ICD-10-CM | POA: Diagnosis not present

## 2024-08-08 NOTE — Patient Instructions (Addendum)
 Nice to see you again  Continue all medications  I will send a message to Dr. Jeneal regarding the [place] and who will coordinate this.  Return to clinic in 5 months or sooner if needed with Dr. Annella

## 2024-08-08 NOTE — Progress Notes (Signed)
 "  @Patient  ID: Kendra Gilbert, female    DOB: 05-19-1956, 69 y.o.   MRN: [id number]  Chief Complaint  Patient presents with   Shortness of Breath    Patient states SOB, states it has been going on for an extended time. Patient also states back pain.    Referring provider: Katina Pfeiffer, PA-C  HPI:   69 y.o. woman whom we are seeing in evaluation of dyspnea on exertion in the setting of severe severe persistent asthma.  Most recent cardiology note reviewed.  Most recent allergy  and immunology note reviewed.  Most recent ENT note reviewed.  Symptoms largely stable.  Cough congestion comes and goes.  It feels a bit better now.  Seen by ENT in the interim.  With nasal turbinate hypertrophy and septal deviation.  Recommended surgical intervention or additional sprays which she was resistant to.  Understandably.  A lot going on.  Managing medically now.  Again it seems like may be a bit better but still of concern with congestion etc.  Allergen immunology discussed Tezspire with her.  I think we should consider this, no other way to really improve her asthma symptoms.  Again I think the congestion is likely multifactorial but reasonable to be aggressive.  HPI at initial visit: Patient has ongoing dyspnea on exertion.  Present for years.  May be slightly worsening over time.  She states she is active and walks every day.  However she does not walk as much as she used to.  Less distance than prior.  She is been escalated to Breztri  for triple inhaled therapy as well as Arnuity added for additional ICS therapy for asthma.  No real improvement in her breathing.  She thinks the inhalers helped some but not massively.  She does use albuterol  as needed with some improvement albeit short-lived.  No time of day when things are better or worse.  No position makes it better worse.  No seasonal or environmental factors she can identify to make things better or worse.  No other alleviating or exacerbating  factors.  She recently had a CTA coronary scan 05/2022 that on my review interpretation reveals mosaicism otherwise clear lungs suspicious for small airways disease, noting limitation and not seen upper portions of the lungs.  Most recent chest x-ray 04/2022 reviewed and interpreted as clear lungs bilaterally.  She reports being diagnosed with interstitial lung disease in 2012.  There is no evidence of this on recent cross-sectional imaging.  It sounds like she was hospitalized at that time with pneumonia, bronchitis, asthma exacerbation.  Suspect this was an acute pneumonitis or pneumonia.  Furthermore, she had a high-res CT scan 2015, I cannot review images but report reveals clear lungs and no evidence of interstitial lung disease.  For further workup of shortness of breath, her echo in 05/2022 did reveal grade 1 diastolic dysfunction but otherwise reassuring results.  She had a perfusion stress test 08/2020 that is normal.  She has had multiple lower extremity Dopplers that showed no DVT.  Notably, her labs have revealed persistently elevated IgE but none tested recently.  She was on Xolair  and reports itchiness and MSK pain so this was stopped.  She is not sure it helped her breathing at all.    Questionaires / Pulmonary Flowsheets:   ACT:  Asthma Control Test ACT Total Score  02/23/2024  9:40 AM 8  12/03/2022  8:26 AM 12  08/12/2022  8:24 AM 12    MMRC:     No  data to display          Epworth:      No data to display          Tests:   FENO:  No results found for: NITRICOXIDE  PFT:    Latest Ref Rng & Units 09/15/2022    9:52 AM  PFT Results  FVC-Pre L 1.49   FVC-Predicted Pre % 58   Pre FEV1/FVC % % 93   FEV1-Pre L 1.39   FEV1-Predicted Pre % 72   DLCO uncorrected ml/min/mmHg 11.03   DLCO UNC% % 66   DLCO corrected ml/min/mmHg 11.60   DLCO COR %Predicted % 70   DLVA Predicted % 106   TLC L 3.88   TLC % Predicted % 90   RV % Predicted % 110   Reviewed  interpreted spirometry just of moderate restriction versus air trapping.  Bronchodilators not administered.  TLC within normal limits, air trapping present consistent with small airways disease, DLCO is mildly reduced but likely underestimated given lung volume used is less than 75% of TLC reported and lung volumes.  WALK:     03/02/2016    8:59 AM  SIX MIN WALK  Supplimental Oxygen during Test? (L/min) No  Tech Comments: brisk pace/no SOB//lmr    Imaging: Personally reviewed and as per EMR and discussion in this note No results found.  Lab Results: Personally reviewed CBC    Component Value Date/Time   WBC 9.9 05/18/2024 0825   RBC 3.86 05/18/2024 0825   HGB 10.5 (L) 05/18/2024 0825   HGB 10.6 (L) 03/14/2024 1245   HCT 32.9 (L) 05/18/2024 0825   HCT 34.9 03/14/2024 1245   PLT 198 05/18/2024 0825   PLT 224 03/14/2024 1245   MCV 85.2 05/18/2024 0825   MCV 86 03/14/2024 1245   MCH 27.2 05/18/2024 0825   MCHC 31.9 (L) 05/18/2024 0825   RDW 15.1 (H) 05/18/2024 0825   RDW 14.0 03/14/2024 1245   LYMPHSABS 1,435 04/12/2023 0824   LYMPHSABS 1.6 10/17/2019 1134   MONOABS 0.5 08/12/2022 0855   EOSABS 50 05/18/2024 0825   EOSABS 0.1 10/17/2019 1134   BASOSABS 20 05/18/2024 0825   BASOSABS 0.0 10/17/2019 1134    BMET    Component Value Date/Time   NA 143 05/18/2024 0825   NA 141 05/05/2022 0825   K 3.8 05/18/2024 0825   CL 105 05/18/2024 0825   CO2 26 05/18/2024 0825   GLUCOSE 93 05/18/2024 0825   BUN 30 (H) 05/18/2024 0825   BUN 32 (H) 05/05/2022 0825   CREATININE 1.08 (H) 05/18/2024 0825   CALCIUM  9.5 05/18/2024 0825   GFRNONAA >60 04/20/2022 0843   GFRAA 99 08/18/2020 1510    BNP    Component Value Date/Time   BNP 71.2 03/14/2024 1245   BNP 70.3 04/20/2022 0843    ProBNP No results found for: PROBNP  Specialty Problems       Pulmonary Problems   Wheezing   Upper airway cough syndrome   Followed in Pulmonary clinic/ [facility]/ Wert rec hs  h2 and h1 08/17/13 > and 01/20/16 > did not take h1 as directed, did not take ppi ac as rec - Allergy  profile 03/02/16>  Eos 0.0/  IgE  483  Grass > tree > Ragweed, dog and dust - Sinus CT 03/09/2016 > Negative exam.  No evidence of acute or chronic sinus disease. - trial of singulair  10 mg 04/01/2016       Dyspnea  03/02/2016  Walked RA x 3 laps @ 185 ft each stopped due to  End of study,fast pace, no sob or desat   - Spirometry 03/02/2016  Suboptimal effort on the early portion of f/v but still had nl flows       Multiple pulmonary nodules determined by computed tomography of lung   See CT chest  12/25/15 largest = 3 mm/ never smoker > no f/u rec by Fleischner society guidelines         Sinusitis   Other seasonal allergic rhinitis    Allergies  Allergen Reactions   Amlodipine  Shortness Of Breath and Swelling   Bee Venom Anaphylaxis and Hives   Lisinopril  Itching and Cough    Dry cough and severe itching in the throat    Other Other (See Comments)    Mosquito bites cause hives all over body   Aspirin Other (See Comments)    Causes stomach problems  aluminum aspirin   Cefdinir Other (See Comments)    Other reaction(s): hives  cefdinir   Erythromycin Other (See Comments)    Ate the lining of her stomach   Hctz [Hydrochlorothiazide ]     Rash    Penicillins Rash and Dermatitis    Has patient had a PCN reaction causing immediate rash, facial/tongue/throat swelling, SOB or lightheadedness with hypotension: Yes  Has patient had a PCN reaction causing severe rash involving mucus membranes or skin necrosis: Yes  Has patient had a PCN reaction that required hospitalization: No   Has patient had a PCN reaction occurring within the last 10 years: No  If all of the above answers are NO, then may proceed with Cephalosporin use.    Immunization History  Administered Date(s) Administered   INFLUENZA, HIGH DOSE SEASONAL PF 08/05/2022   Influenza,inj,Quad PF,6+ Mos 05/20/2021    Moderna Covid-19 Vaccine Bivalent Booster 48yrs & up 04/20/2021   Moderna Sars-Covid-2 Vaccination 10/02/2019, 10/30/2019, 05/05/2020, 10/13/2020, 04/08/2021, 03/17/2023, 04/18/2024    Past Medical History:  Diagnosis Date   Allergy     Anemia    Asthma    CAD (coronary artery disease)    Moderate, nonobstructive CAD as seen on coronary CTA in 2020 with normal FFR.   Cataracts, bilateral    Chronic kidney disease    cyst in left kidney   Class 2 obesity    Colon cancer [facility]) 2017   surgical tx  removal of polyp   Concussion 04/20/2021   Coronary artery calcification    a. POET in 2015 with no acute findings.   Diarrhea since 02-16-17   c dif test normal per pt   Dyspnea     with exertion, occ at rest no oxygen or inhaler use   GERD (gastroesophageal reflux disease)    H/O: rheumatic fever as child   Headache    occ left side migraine   Heart murmur    Hyperlipidemia    Hypertension    Leg edema    Lung disease    Lung nodule   Neuromuscular disorder (HCC)    Non-alcoholic cirrhosis (HCC) 2013   no current issues with   Pneumonia 2016   Rheumatoid arthritis (HCC)    oa also, back arthritis, hx rheumatoid     Tobacco History: Social History   Tobacco Use  Smoking Status Never   Passive exposure: Never  Smokeless Tobacco Never   Counseling given: Not Answered   Continue to not smoke  Outpatient Encounter Medications as of 08/08/2024  Medication Sig  acetaminophen  (TYLENOL ) 500 MG tablet Take 325 mg by mouth daily as needed for mild pain, fever or headache.   ARNUITY ELLIPTA  200 MCG/ACT AEPB INHALE 1 PUFF INTO THE LUNGS DAILY   azelastine  (OPTIVAR ) 0.05 % ophthalmic solution Place 1 drop into both eyes 2 (two) times daily as needed (Itchy, watery eyes).   BREZTRI  AEROSPHERE 160-9-4.8 MCG/ACT AERO inhaler INHALE 2 PUFFS INTO THE LUNGS TWICE DAILY TO PREVENT COUGH OR WHEEZE AND RINSE MOUTH AFTERWARDS   desloratadine  (CLARINEX ) 5 MG tablet Take 1 tablet (5 mg total)  by mouth daily as needed.   diphenhydrAMINE (BENADRYL) 25 MG tablet Take 25 mg by mouth every 6 (six) hours as needed.   EPINEPHrine  0.3 mg/0.3 mL IJ SOAJ injection Inject 0.3 mg into the muscle as needed for anaphylaxis.   Fluticasone -Umeclidin-Vilant (TRELEGY ELLIPTA ) 200-62.5-25 MCG/ACT AEPB Inhale 1 puff into the lungs daily. Rinse mouth after each use.   folic acid  (FOLVITE ) 1 MG tablet Take 1 tablet (1 mg total) by mouth daily.   furosemide  (LASIX ) 40 MG tablet TAKE 1 TABLET BY MOUTH DAILY AS NEEDED FOR WEIGHT GAIN OF 3LBS OVERNIGHT OR 5LBS IN 1 WEEK   ipratropium-albuterol  (DUONEB) 0.5-2.5 (3) MG/3ML SOLN Use 1 vial via nebulizer every 4-6 hours as needed for cough, wheeze, shortness of breath or chest tightness   isosorbide  mononitrate (IMDUR ) 30 MG 24 hr tablet Take 1 tablet (30 mg total) by mouth daily.   levocetirizine (XYZAL ) 5 MG tablet TAKE 1 TABLET(5 MG) BY MOUTH EVERY EVENING   losartan  (COZAAR ) 25 MG tablet TAKE 1 TABLET(25 MG) BY MOUTH IN THE MORNING AND AT BEDTIME   pantoprazole  (PROTONIX ) 40 MG tablet Take 40 mg by mouth 2 (two) times daily.   pregabalin  (LYRICA ) 50 MG capsule Take 1 capsule (50 mg total) by mouth 2 (two) times daily.   RESTASIS 0.05 % ophthalmic emulsion 1 drop 2 (two) times daily.   rosuvastatin  (CRESTOR ) 20 MG tablet Take 1 tablet (20 mg total) by mouth daily.   Sodium Chloride -Sodium Bicarb 1.57 g PACK Place into the nose.   VENTOLIN  HFA 108 (90 Base) MCG/ACT inhaler Inhale 2 puffs into the lungs every 4 (four) hours as needed for wheezing or shortness of breath.   Carbinoxamine  Maleate 4 MG TABS 1 tab 1-2 times a day   nitroGLYCERIN  (NITROSTAT ) 0.4 MG SL tablet Place 1 tablet (0.4 mg total) under the tongue every 5 (five) minutes as needed for chest pain.   ondansetron  (ZOFRAN -ODT) 4 MG disintegrating tablet Take 1 tablet (4 mg total) by mouth every 8 (eight) hours as needed for nausea or vomiting.   No facility-administered encounter medications on file  as of 08/08/2024.    Review of Systems  Review of Systems  N/a Physical Exam  BP (!) 110/58 (BP Location: Right Arm, Patient Position: Sitting, Cuff Size: Normal)   Pulse 88   Ht 4' 11 (1.499 m)   Wt 176 lb (79.8 kg)   SpO2 97%   BMI 35.55 kg/m   Wt Readings from Last 5 Encounters:  08/08/24 176 lb (79.8 kg)  05/18/24 184 lb 6.4 oz (83.6 kg)  04/16/24 187 lb (84.8 kg)  04/16/24 187 lb 6.4 oz (85 kg)  04/11/24 187 lb (84.8 kg)    BMI Readings from Last 5 Encounters:  08/08/24 35.55 kg/m  05/18/24 37.24 kg/m  04/16/24 37.77 kg/m  04/16/24 37.85 kg/m  04/11/24 37.77 kg/m     Physical Exam General: In chair no distress Eyes: No icterus  Neck: No JVP Pulmonary: Clear bilaterally, distant Cardiovascular: Warm, 1+ pitting edema to midshin noted Abdomen: Nondistended MSK: Cast on left forearm/wrist   Assessment & Plan:   Severe persistent asthma: On triple inhaled therapy via Breztri  (shortly switching to Trelegy via insurance stopping rescue coverage) and Arnuity for additional ICS therapy.  Still with shortness of breath, cough.  Worse when outdoors.  Some relief with albuterol .  Suspect difficult to control with inhalers given elevated IgE in the past.  She reports adverse reaction to Xolair  including itching, musculoskeletal pain.  Eosinophils not elevated.  Recommend Tezspire, will coordinate with allergy  and immunology regarding who will manage.  Dyspnea on exertion: Suspect multifactorial and related to deconditioning, she is active but less active than prior, habitus, poorly controlled asthma, cardiac abnormalities.  Chest imaging in the past has been clear.  Low suspicion for additional parenchymal disease or other pulmonary contributors.  TTE in 05/2022 did demonstrate diastolic dysfunction, she has lower extremity swelling so cardiac contributors are certainly possible if not likely. Repeat TTE 03/2024 stable but her ongoing LE edema is suspicious for volume  overload as contributor.  PFTs largely normal which is reassuring.  CT high-resolution 12/2023 clear without ILD.  No other contributors from the lungs that can be identified.     Return in about 5 months (around 01/05/2025) for f/u Dr. Annella.   Donnice JONELLE Annella, MD 08/08/2024   "

## 2024-10-17 ENCOUNTER — Ambulatory Visit: Admitting: Cardiology

## 2024-11-05 ENCOUNTER — Ambulatory Visit: Admitting: Neurology

## 2024-11-15 ENCOUNTER — Ambulatory Visit: Admitting: Allergy

## 2024-12-24 ENCOUNTER — Ambulatory Visit: Admitting: Pulmonary Disease
